# Patient Record
Sex: Male | Born: 1940 | Race: White | Hispanic: No | Marital: Married | Smoking: Former smoker
Health system: Southern US, Community
[De-identification: ages and names within clinical notes are randomized; demographics above are authoritative.]

## PROBLEM LIST (undated history)

## (undated) DIAGNOSIS — S99919A Unspecified injury of unspecified ankle, initial encounter: Secondary | ICD-10-CM

## (undated) DIAGNOSIS — C4491 Basal cell carcinoma of skin, unspecified: Secondary | ICD-10-CM

## (undated) DIAGNOSIS — I10 Essential (primary) hypertension: Secondary | ICD-10-CM

## (undated) DIAGNOSIS — C61 Malignant neoplasm of prostate: Secondary | ICD-10-CM

## (undated) DIAGNOSIS — E785 Hyperlipidemia, unspecified: Secondary | ICD-10-CM

## (undated) DIAGNOSIS — T7840XA Allergy, unspecified, initial encounter: Secondary | ICD-10-CM

## (undated) DIAGNOSIS — K259 Gastric ulcer, unspecified as acute or chronic, without hemorrhage or perforation: Secondary | ICD-10-CM

## (undated) DIAGNOSIS — H269 Unspecified cataract: Secondary | ICD-10-CM

## (undated) DIAGNOSIS — K219 Gastro-esophageal reflux disease without esophagitis: Secondary | ICD-10-CM

## (undated) DIAGNOSIS — J189 Pneumonia, unspecified organism: Secondary | ICD-10-CM

## (undated) DIAGNOSIS — J302 Other seasonal allergic rhinitis: Secondary | ICD-10-CM

## (undated) DIAGNOSIS — J329 Chronic sinusitis, unspecified: Secondary | ICD-10-CM

## (undated) DIAGNOSIS — Z5189 Encounter for other specified aftercare: Secondary | ICD-10-CM

## (undated) DIAGNOSIS — E039 Hypothyroidism, unspecified: Secondary | ICD-10-CM

## (undated) DIAGNOSIS — M1990 Unspecified osteoarthritis, unspecified site: Secondary | ICD-10-CM

## (undated) HISTORY — DX: Unspecified osteoarthritis, unspecified site: M19.90

## (undated) HISTORY — DX: Gastro-esophageal reflux disease without esophagitis: K21.9

## (undated) HISTORY — DX: Essential (primary) hypertension: I10

## (undated) HISTORY — DX: Malignant neoplasm of prostate: C61

## (undated) HISTORY — DX: Pneumonia, unspecified organism: J18.9

## (undated) HISTORY — PX: CATARACT EXTRACTION, BILATERAL: SHX1313

## (undated) HISTORY — PX: OTHER SURGICAL HISTORY: SHX169

## (undated) HISTORY — DX: Basal cell carcinoma of skin, unspecified: C44.91

## (undated) HISTORY — DX: Encounter for other specified aftercare: Z51.89

## (undated) HISTORY — PX: POLYPECTOMY: SHX149

## (undated) HISTORY — PX: UPPER GASTROINTESTINAL ENDOSCOPY: SHX188

## (undated) HISTORY — PX: PROSTATE BIOPSY: SHX241

## (undated) HISTORY — DX: Chronic sinusitis, unspecified: J32.9

## (undated) HISTORY — DX: Allergy, unspecified, initial encounter: T78.40XA

## (undated) HISTORY — PX: MOHS SURGERY: SUR867

## (undated) HISTORY — PX: BACK SURGERY: SHX140

## (undated) HISTORY — DX: Unspecified cataract: H26.9

## (undated) HISTORY — DX: Unspecified injury of unspecified ankle, initial encounter: S99.919A

## (undated) HISTORY — DX: Hyperlipidemia, unspecified: E78.5

## (undated) HISTORY — PX: COLONOSCOPY: SHX174

## (undated) HISTORY — DX: Hypothyroidism, unspecified: E03.9

## (undated) HISTORY — DX: Other seasonal allergic rhinitis: J30.2

## (undated) HISTORY — DX: Gastric ulcer, unspecified as acute or chronic, without hemorrhage or perforation: K25.9

---
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
## 1980-03-07 HISTORY — PX: RECONSTRUCTION TENDON PULLEY W/ TENDON / FASCIAL GRAFT OF HAND / FINGER: SUR1091

## 1998-11-11 HISTORY — PX: CYSTOURETHROSCOPY: SHX476

## 2001-06-01 DIAGNOSIS — S99919A Unspecified injury of unspecified ankle, initial encounter: Secondary | ICD-10-CM

## 2001-06-01 HISTORY — DX: Unspecified injury of unspecified ankle, initial encounter: S99.919A

## 2005-03-07 HISTORY — PX: LUMBAR LAMINECTOMY: SHX95

## 2006-04-12 DIAGNOSIS — J189 Pneumonia, unspecified organism: Secondary | ICD-10-CM

## 2006-04-12 HISTORY — DX: Pneumonia, unspecified organism: J18.9

## 2008-03-07 DIAGNOSIS — K259 Gastric ulcer, unspecified as acute or chronic, without hemorrhage or perforation: Secondary | ICD-10-CM

## 2008-03-07 HISTORY — PX: ESOPHAGOGASTRODUODENOSCOPY: SHX1529

## 2008-03-07 HISTORY — DX: Gastric ulcer, unspecified as acute or chronic, without hemorrhage or perforation: K25.9

## 2011-01-20 ENCOUNTER — Encounter: Payer: Self-pay | Admitting: Internal Medicine

## 2011-02-04 ENCOUNTER — Encounter: Payer: Self-pay | Admitting: Internal Medicine

## 2011-02-04 ENCOUNTER — Ambulatory Visit (AMBULATORY_SURGERY_CENTER): Payer: Medicare Other | Admitting: *Deleted

## 2011-02-04 ENCOUNTER — Telehealth: Payer: Self-pay | Admitting: *Deleted

## 2011-02-04 VITALS — Ht 68.0 in | Wt 220.7 lb

## 2011-02-04 DIAGNOSIS — Z1211 Encounter for screening for malignant neoplasm of colon: Secondary | ICD-10-CM

## 2011-02-04 MED ORDER — PEG-KCL-NACL-NASULF-NA ASC-C 100 G PO SOLR: ORAL | Status: DC

## 2011-02-04 NOTE — Telephone Encounter (Signed)
Faxed Medical Release Information form to Dr Merilynn Finland ofc at fax [phone], ph [phone].

## 2011-02-04 NOTE — Telephone Encounter (Signed)
Pt had prior colonoscopy with Dr. Hildred Alamin in [place], [place] in 2007.  Pt thinks he had polyps.  Release of information form signed and given to Graciella Freer, RN.  Pt scheduled for colonoscopy with Dr. Rhea Belton 02/18/2011.  Eddie Ferguson

## 2011-02-04 NOTE — Progress Notes (Signed)
Pt had prior colonoscopy with Dr. Venkatesh Lakshman in [place], [place] in 2007.  Pt thinks he had polyps.  Release of information form signed and given to Cynthia Bradley, RN.  Pt scheduled for colonoscopy with Dr. Pyrtle 02/18/2011.  [name]  

## 2011-02-18 ENCOUNTER — Ambulatory Visit (AMBULATORY_SURGERY_CENTER): Payer: Medicare Other | Admitting: Internal Medicine

## 2011-02-18 ENCOUNTER — Encounter: Payer: Self-pay | Admitting: Internal Medicine

## 2011-02-18 VITALS — BP 157/91 | HR 62 | Temp 97.6°F | Resp 18 | Ht 68.0 in | Wt 220.0 lb

## 2011-02-18 DIAGNOSIS — D126 Benign neoplasm of colon, unspecified: Secondary | ICD-10-CM

## 2011-02-18 DIAGNOSIS — Z86010 Personal history of colonic polyps: Secondary | ICD-10-CM

## 2011-02-18 DIAGNOSIS — K635 Polyp of colon: Secondary | ICD-10-CM

## 2011-02-18 DIAGNOSIS — Z1211 Encounter for screening for malignant neoplasm of colon: Secondary | ICD-10-CM

## 2011-02-18 MED ORDER — SODIUM CHLORIDE 0.9 % IV SOLN: 500.0000 mL | INTRAVENOUS | Status: DC

## 2011-02-18 NOTE — Patient Instructions (Signed)
POLYPS REMOVED AND SENT TO PATHOLOGY  MODERATE DIVERTICULOSIS  SEE GREEN AND BLUE SHEETS FOR ADDITIONAL D/C INSTRUCTIONS

## 2011-02-18 NOTE — Progress Notes (Signed)
Patient did not experience any of the following events: a burn prior to discharge; a fall within the facility; wrong site/side/patient/procedure/implant event; or a hospital transfer or hospital admission upon discharge from the facility. (G8907) Patient did not have preoperative order for IV antibiotic SSI prophylaxis. (G8918)  

## 2011-02-18 NOTE — Op Note (Signed)
[facility] [address] [place], [place]  [address]  COLONOSCOPY PROCEDURE REPORT  PATIENT:  Kacey, Vicuna  MR#:  [id number] BIRTHDATE:  03-Jun-1940, 70 yrs. old  GENDER:  male ENDOSCOPIST:  Carie Caddy. Correen Bubolz, MD REF. BY:  Gabriel Earing, M.D. PROCEDURE DATE:  02/18/2011 PROCEDURE:  Colonoscopy with snare polypectomy, Colon with cold biopsy polypectomy ASA CLASS:  Class II INDICATIONS:  surveillance and high-risk screening  (history of colon polyps) MEDICATIONS:   These medications were titrated to patient response per physician's verbal order, Versed 6 mg IV, Fentanyl 50 mcg IV  DESCRIPTION OF PROCEDURE:   After the risks benefits and alternatives of the procedure were thoroughly explained, informed consent was obtained.  Digital rectal exam was performed and revealed no rectal masses.   The LB CF-[id number] endoscope was introduced through the anus and advanced to the cecum, which was identified by both the appendix and ileocecal valve, without limitations.  The quality of the prep was good, using MoviPrep. The instrument was then slowly withdrawn as the colon was fully examined. <<PROCEDUREIMAGES>> FINDINGS:  A 5 mm sessile polyp was found in the mid transverse colon. Polyp was snared without cautery. Retrieval was successful. Two 3 mmm sessile polyps were found in the rectum. The polyps were removed using cold biopsy forceps.  Moderate diverticulosis was found transverse to sigmoid colon.  Retroflexed views in the rectum revealed no abnormalities.   The scope was then withdrawn from the cecum and the procedure completed.  COMPLICATIONS:  None  ENDOSCOPIC IMPRESSION: 1) Sessile polyp in the mid transverse colon.  Removed and sent to pathology. 2) Two polyps in the rectum. Removed and sent to pathology. 3) Moderate diverticulosis in the transverse to sigmoid colon  RECOMMENDATIONS: 1) Await pathology results 2) High fiber diet. 3) If the polyp(s) removed  today are proven to be adenomatous (pre-cancerous) polyps, you will need a colonoscopy in 3 years. Otherwise you should continue to follow colorectal cancer screening guidelines for patients with a history of colon  polyps with a colonoscopy in 5 years. 4) You will receive a letter within 1-2 weeks with the results of your biopsy as well as final recommendations. Please call my office if you have not received a letter after 3 weeks.  Carie Caddy. Rhea Belton, MD  CC:  Gabriel Earing, MD The Patient  n. eSIGNEDCarie Caddy. Breslin Burklow at 02/18/2011 09:25 AM  Kandis Cocking, [phone]

## 2011-02-21 ENCOUNTER — Telehealth: Payer: Self-pay

## 2011-02-21 NOTE — Telephone Encounter (Signed)
Line busy. Unable to leave message. Attempted times two.

## 2011-02-23 ENCOUNTER — Encounter: Payer: Self-pay | Admitting: Internal Medicine

## 2011-04-24 DIAGNOSIS — Z8619 Personal history of other infectious and parasitic diseases: Secondary | ICD-10-CM | POA: Insufficient documentation

## 2011-09-22 ENCOUNTER — Telehealth: Payer: Self-pay | Admitting: Oncology

## 2011-09-22 NOTE — Telephone Encounter (Signed)
lmonvm for pt re calling me for appt w/FS. °

## 2011-09-23 ENCOUNTER — Telehealth: Payer: Self-pay | Admitting: Oncology

## 2011-09-23 NOTE — Telephone Encounter (Signed)
lmonvm for pt re calling me for appt w/FS. 2nd attempt.  °

## 2011-09-26 ENCOUNTER — Telehealth: Payer: Self-pay | Admitting: Oncology

## 2011-09-26 NOTE — Telephone Encounter (Signed)
Referred by Dr. Gabriel Earing Dx- Prostate Ca. NP package mailed out.

## 2011-09-26 NOTE — Telephone Encounter (Signed)
S/w pt re appt for 8/2 @ 10:30 am. D/t per pt.

## 2011-10-05 ENCOUNTER — Other Ambulatory Visit: Payer: Self-pay | Admitting: Oncology

## 2011-10-05 DIAGNOSIS — C61 Malignant neoplasm of prostate: Secondary | ICD-10-CM

## 2011-10-07 ENCOUNTER — Ambulatory Visit (HOSPITAL_BASED_OUTPATIENT_CLINIC_OR_DEPARTMENT_OTHER): Payer: Medicare Other | Admitting: Oncology

## 2011-10-07 ENCOUNTER — Telehealth: Payer: Self-pay | Admitting: Oncology

## 2011-10-07 ENCOUNTER — Other Ambulatory Visit (HOSPITAL_BASED_OUTPATIENT_CLINIC_OR_DEPARTMENT_OTHER): Payer: Medicare Other | Admitting: Lab

## 2011-10-07 ENCOUNTER — Ambulatory Visit: Payer: Medicare Other

## 2011-10-07 VITALS — BP 148/70 | HR 68 | Temp 97.6°F | Resp 20 | Ht 68.0 in | Wt 229.5 lb

## 2011-10-07 DIAGNOSIS — E119 Type 2 diabetes mellitus without complications: Secondary | ICD-10-CM

## 2011-10-07 DIAGNOSIS — C61 Malignant neoplasm of prostate: Secondary | ICD-10-CM

## 2011-10-07 DIAGNOSIS — I10 Essential (primary) hypertension: Secondary | ICD-10-CM

## 2011-10-07 DIAGNOSIS — Z808 Family history of malignant neoplasm of other organs or systems: Secondary | ICD-10-CM

## 2011-10-07 LAB — CBC WITH DIFFERENTIAL/PLATELET
Basophils Absolute: 0 10*3/uL (ref 0.0–0.1)
EOS%: 3.5 % (ref 0.0–7.0)
HGB: 14.3 g/dL (ref 13.0–17.1)
MCH: 30.7 pg (ref 27.2–33.4)
NEUT#: 4.5 10*3/uL (ref 1.5–6.5)
RDW: 13.8 % (ref 11.0–14.6)
lymph#: 1.1 10*3/uL (ref 0.9–3.3)

## 2011-10-07 LAB — COMPREHENSIVE METABOLIC PANEL
ALT: 31 U/L (ref 0–53)
AST: 25 U/L (ref 0–37)
Albumin: 4.1 g/dL (ref 3.5–5.2)
BUN: 13 mg/dL (ref 6–23)
Calcium: 8.8 mg/dL (ref 8.4–10.5)
Chloride: 104 mEq/L (ref 96–112)
Potassium: 3.8 mEq/L (ref 3.5–5.3)

## 2011-10-07 LAB — TESTOSTERONE: Testosterone: 201.39 ng/dL — ABNORMAL LOW (ref 300–890)

## 2011-10-07 NOTE — Telephone Encounter (Signed)
Pt req October for appt,aware that he will rec. A call. Ref. Form filled out and sent to HIM

## 2011-10-07 NOTE — Progress Notes (Signed)
Note dictated

## 2011-10-10 ENCOUNTER — Telehealth: Payer: Self-pay | Admitting: *Deleted

## 2011-10-10 NOTE — Telephone Encounter (Signed)
Called patient and left v/m with PSA results drawn 10/07/2011.  Encouraged to call back with any further questions.

## 2011-10-10 NOTE — Progress Notes (Signed)
CC:   Gabriel Earing, M.D. [facility]  REASON FOR CONSULTATION:  Prostate cancer.  HISTORY OF PRESENT ILLNESS:  This is a 71 year old gentleman, a native of [place], lived in multiple locations, currently of [place].  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 He also was in the [employer] at one point.  This is a gentleman with past medical history for hypertension, diabetes and followed by Dr. Gabriel Earing for his routine medical care.  On a routine PSA back in November of 2012 his PSA was found to be elevated at 5.9.  The patient was evaluated by Dr. Leanord Asal from Urology at [facility] and underwent a prostate biopsy on 07/29/2011.  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  The patient was evaluated (as mentioned) by Dr. Andrey Campanile postoperatively, did well with the biopsy and the treatment options were discussed and he was given different treatment options that included surgery, radiation therapy, observation and possible local therapy.  The patient would like to establish care here in [place] and was referred to me for evaluation regarding his new diagnosis of prostate cancer.  Clinically he feels well.  He does not report any back pain, shoulder pain, hip pain.  Does not report any urination issues.  Has not reported any frequency or urgency.  Does not report any hesitancy.  Did not report any musculoskeletal complaints.  Did not report any other complaints.  His performance status is excellent.  REVIEW OF SYSTEMS:  Did not report any headaches, blurry vision, double vision.  Did not report any motor or sensory neuropathy.  Did not report any alteration of mental status.  Did not report any psychiatric issues, depression.  Did not report any fever, chills,  sweats.  There has not been any cough, hemoptysis, hematemesis.  No nausea, vomiting.  No abdominal pain, hematochezia, melena.  No genitourinary complaints. Rest of review of systems unremarkable.  PAST MEDICAL HISTORY:  Significant for hypertension, diabetes, hyperlipidemia, history of glaucoma, hypothyroidism.  MEDICATIONS:  He is on Tylenol, carvedilol, vitamin D, diltiazem, Allegra, Flonase, Xalatan, Synthroid, glucosamine, multivitamin, niacin, omega 3, omeprazole, Accupril.  ALLERGIES:  Sulfa.  SOCIAL HISTORY:  He is married.  He has 2 sons.  Denied any alcohol or tobacco abuse.  FAMILY HISTORY:  His brother had lung cancer with brain metastasis. Father died of an MI.  Really no history of any prostate cancer that he could admit to.  PHYSICAL EXAMINATION:  Alert, awake gentleman, appeared in no active distress.  His blood pressure is 148/70, pulse 68, respirations 20.  He is afebrile.  Head is normocephalic, atraumatic.  Pupils equal, round, reactive to light.  Oral mucosa moist and pink.  Neck supple without adenopathy.  Heart is regular rate and rhythm.  S1, S2.  Lungs clear to auscultation.  No rhonchi, wheeze or dullness to percussion.  Abdomen soft, nontender.  No hepatosplenomegaly.  Extremities:  No edema.  LABORATORY DATA:  His hemoglobin is 14, white cell count 6.8, platelet count of 202.  The patient reportedly had a PSA 08/29/2011 which showed actually that his PSA went down from 5.9 to around 4.8.  ASSESSMENT AND PLAN:  This is a very pleasant gentleman with the following issues: A diagnosis of  prostate cancer.  He had a Gleason score 3 plus 3 equals 8 (sic) with very tertiary pattern of Gleason score of 7.  He has a clinical stage of T2c and a PSA at the time of diagnosis 5.9 that recently dropped down to less than 5 around 4.8 or so.  I had a lengthy discussion today again with Mr. Quintanar discussing the natural course of prostate cancer and different  treatment options.  I reiterated a lot of the options that Dr. Andrey Campanile had outlined extensively.  I feel that Mr. Vuolo is a good candidate for observation and surveillance.  I reiterated to him the findings of the Pivot trial and that surveillance is really not inferior to a radical prostatectomy.  He is interested in pursuing that.  He would like to have that done locally.  For that reason I will make referrals to [facility] at this time.  I told Mr. Crisman I would be more than happy to see him in the future regarding counseling or regarding his prostate cancer; at the time being there is really no role for medical oncology, no systemic treatment is needed at this time.    ______________________________ Benjiman Core, M.D. FNS/MEDQ  D:  10/07/2011  T:  10/07/2011  Job:  [id number]

## 2011-10-20 ENCOUNTER — Telehealth: Payer: Self-pay | Admitting: Oncology

## 2011-10-20 NOTE — Telephone Encounter (Signed)
Per [facility] the pt is seeing Dr Laverle Patter 12/23/11 11;15 per pt request   aom

## 2012-01-03 ENCOUNTER — Other Ambulatory Visit (HOSPITAL_COMMUNITY): Payer: Self-pay | Admitting: Urology

## 2012-01-03 DIAGNOSIS — C61 Malignant neoplasm of prostate: Secondary | ICD-10-CM

## 2012-01-13 ENCOUNTER — Ambulatory Visit (HOSPITAL_COMMUNITY)
Admission: RE | Admit: 2012-01-13 | Discharge: 2012-01-13 | Disposition: A | Discharge status: Home / Self Care | Payer: Medicare Other | Source: Ambulatory Visit | Attending: Urology | Admitting: Urology

## 2012-01-13 ENCOUNTER — Other Ambulatory Visit (HOSPITAL_COMMUNITY): Payer: Self-pay | Admitting: Urology

## 2012-01-13 DIAGNOSIS — C61 Malignant neoplasm of prostate: Secondary | ICD-10-CM

## 2012-01-13 MED ORDER — GADOBENATE DIMEGLUMINE 529 MG/ML IV SOLN
20.0000 mL | Freq: Once | INTRAVENOUS | Status: AC | PRN
  Administered 2012-01-13: 20 mL via INTRAVENOUS

## 2012-11-18 DIAGNOSIS — Z85828 Personal history of other malignant neoplasm of skin: Secondary | ICD-10-CM | POA: Insufficient documentation

## 2013-09-09 LAB — HM DIABETES EYE EXAM

## 2014-02-13 ENCOUNTER — Ambulatory Visit (HOSPITAL_COMMUNITY): Payer: Medicare Other | Attending: Emergency Medicine

## 2014-02-13 ENCOUNTER — Encounter (HOSPITAL_COMMUNITY): Payer: Self-pay | Admitting: Emergency Medicine

## 2014-02-13 ENCOUNTER — Emergency Department (HOSPITAL_COMMUNITY)
Admission: EM | Admit: 2014-02-13 | Discharge: 2014-02-13 | Disposition: A | Discharge status: Home / Self Care | Payer: Medicare Other | Source: Home / Self Care | Attending: Family Medicine | Admitting: Family Medicine

## 2014-02-13 DIAGNOSIS — J208 Acute bronchitis due to other specified organisms: Secondary | ICD-10-CM

## 2014-02-13 DIAGNOSIS — R0902 Hypoxemia: Secondary | ICD-10-CM | POA: Diagnosis not present

## 2014-02-13 DIAGNOSIS — R05 Cough: Secondary | ICD-10-CM | POA: Insufficient documentation

## 2014-02-13 DIAGNOSIS — R509 Fever, unspecified: Secondary | ICD-10-CM | POA: Insufficient documentation

## 2014-02-13 DIAGNOSIS — R059 Cough, unspecified: Secondary | ICD-10-CM

## 2014-02-13 MED ORDER — ALBUTEROL SULFATE (5 MG/ML) 0.5% IN NEBU
5.0000 mg | INHALATION_SOLUTION | Freq: Once | RESPIRATORY_TRACT | Status: AC
  Administered 2014-02-13: 5 mg via RESPIRATORY_TRACT

## 2014-02-13 MED ORDER — PREDNISONE 20 MG PO TABS: ORAL_TABLET | ORAL | Status: DC

## 2014-02-13 MED ORDER — ALBUTEROL SULFATE HFA 108 (90 BASE) MCG/ACT IN AERS: 2.0000 | INHALATION_SPRAY | RESPIRATORY_TRACT | Status: DC | PRN

## 2014-02-13 MED ORDER — ALBUTEROL SULFATE (2.5 MG/3ML) 0.083% IN NEBU
INHALATION_SOLUTION | RESPIRATORY_TRACT | Status: AC
  Filled 2014-02-13: qty 6

## 2014-02-13 NOTE — ED Notes (Signed)
Transferred to [facility] for chest xray

## 2014-02-13 NOTE — ED Provider Notes (Signed)
CSN: [id number]     Arrival date & time 02/13/14  0935 History   First MD Initiated Contact with Patient 02/13/14 0945     Chief Complaint  Patient presents with  . Cough   (Consider location/radiation/quality/duration/timing/severity/associated sxs/prior Treatment) HPI Comments: 73 year old man complaining of a cough for 6 weeks. He describes it as a frequent and spasmodic type cough. He denies shortness of breath except with having coughing spasms. He has been out of town and has seen 3 separate physicians for sinus infection, inner ear infection and congestion in the throat. He is currently taking Ceftinir. He developed a low-grade fever this morning. His cough persist although he has a rare cough during the exam. His only medications for symptoms are cough drops and Robitussin. He went to work yesterday and was exhausted afterwards. He has a history of well-controlled type 2 diabetes mellitus, hypertension, hypothyroidism and dyslipidemia.    Past Medical History  Diagnosis Date  . Diabetes mellitus   . Hypertension   . Hyperlipidemia   . Hypothyroidism   . Seasonal allergies   . Glaucoma   . Arthritis   . Blood transfusion     2010 because of stomach ulcers  . Stomach ulcer 2010    bleeding ulcer result NSAIDS   Past Surgical History  Procedure Laterality Date  . Lumbar laminectomy  2007  . Reconstruction tendon pulley w/ tendon / fascial graft of hand / finger  1982    rt 3rd finger  . Reconstruction tendon pulley w/ tendon / fascial graft of hand / finger  1953    rt 5th finger   Family History  Problem Relation Age of Onset  . Colon cancer Neg Hx   . Esophageal cancer Neg Hx   . Stomach cancer Neg Hx    History  Substance Use Topics  . Smoking status: Former Smoker    Quit date: 02/03/2001  . Smokeless tobacco: Never Used  . Alcohol Use: 3.6 oz/week    6 Cans of beer per week    Review of Systems  Constitutional: Positive for fever, activity change and  appetite change.  HENT: Positive for congestion and postnasal drip. Negative for ear pain and sore throat.   Eyes: Negative.   Respiratory: Positive for cough. Negative for chest tightness and wheezing.   Cardiovascular: Negative for chest pain.  Gastrointestinal: Negative.   Genitourinary: Negative.   Musculoskeletal: Negative.   Skin: Negative for color change and rash.  Neurological: Negative.     Allergies  Amoxicillin-pot clavulanate and Sulfa antibiotics  Home Medications   Prior to Admission medications   Medication Sig Start Date End Date Taking? Authorizing Provider  acetaminophen (TYLENOL) 500 MG tablet Take 500 mg by mouth every 6 (six) hours as needed.      Historical Provider, MD  albuterol (PROVENTIL HFA;VENTOLIN HFA) 108 (90 BASE) MCG/ACT inhaler Inhale 2 puffs into the lungs every 4 (four) hours as needed for wheezing or shortness of breath. 02/13/14   Janne Napoleon, NP  carvedilol (COREG) 25 MG tablet Take 25 mg by mouth 2 (two) times daily with a meal.  12/17/10   Historical Provider, MD  Cholecalciferol (VITAMIN D) 2000 UNITS CAPS Take by mouth daily.      Historical Provider, MD  diltiazem (CARDIZEM CD) 180 MG 24 hr capsule Take 180 mg by mouth 2 (two) times daily.  12/17/10   Historical Provider, MD  fluticasone (FLONASE) 50 MCG/ACT nasal spray Place 2 sprays into the nose 2 (two)  times daily.      Historical Provider, MD  Glucosamine-Chondroit-Vit C-Mn (GLUCOSAMINE 1500 COMPLEX PO) Take by mouth daily.      Historical Provider, MD  latanoprost (XALATAN) 0.005 % ophthalmic solution Place 1 drop into both eyes at bedtime.  01/21/11   Historical Provider, MD  Multiple Vitamin (MULTIVITAMIN) tablet Take 1 tablet by mouth daily.      Historical Provider, MD  niacin 500 MG tablet Take 500 mg by mouth daily with breakfast.      Historical Provider, MD  Omega-3 Fatty Acids (FISH OIL) 1200 MG CAPS Take by mouth daily.      Historical Provider, MD  omeprazole (PRILOSEC) 20 MG  capsule Take 20 mg by mouth daily.      Historical Provider, MD  pioglitazone (ACTOS) 30 MG tablet Take 30 mg by mouth daily.      Historical Provider, MD  pravastatin (PRAVACHOL) 40 MG tablet Take 40 mg by mouth daily.  01/19/11   Historical Provider, MD  predniSONE (DELTASONE) 20 MG tablet Take 3 tabs po on first day, 2 tabs second day, 2 tabs third day, 1 tab fourth day, 1 tab 5th day. Take with food. 02/13/14   Janne Napoleon, NP  quinapril (ACCUPRIL) 20 MG tablet Take 20 mg by mouth daily.  12/17/10   Historical Provider, MD  SYNTHROID 50 MCG tablet Take 50 mcg by mouth daily.  01/17/11   Historical Provider, MD   BP 152/69 mmHg  Pulse 78  Temp(Src) 99.9 F (37.7 C) (Oral)  Resp 18  SpO2 98% Physical Exam  Constitutional: He is oriented to person, place, and time. He appears well-developed and well-nourished. No distress.  HENT:  Mouth/Throat: Oropharynx is clear and moist. No oropharyngeal exudate.  Bilateral TMs are normal   Eyes: Conjunctivae and EOM are normal.  Neck: Normal range of motion. Neck supple.  Cardiovascular: Normal rate, regular rhythm and normal heart sounds.   Pulmonary/Chest: Effort normal. No respiratory distress.  Rare wheeze in the right lung fields, few crackles on the right. Left lung clear.  Musculoskeletal: He exhibits no edema.  Lymphadenopathy:    He has no cervical adenopathy.  Neurological: He is alert and oriented to person, place, and time.  Skin: Skin is warm and dry.  Psychiatric: He has a normal mood and affect.  Nursing note and vitals reviewed.   ED Course  Procedures (including critical care time) Labs Review Labs Reviewed - No data to display  Imaging Review Dg Chest 2 View  02/13/2014   CLINICAL DATA:  Productive cough and fever.  Hypoxia.  EXAM: CHEST  2 VIEW  COMPARISON:  none  FINDINGS: Heart size and vascularity are normal. Mild atelectasis in the lung bases without pneumonia or effusion. Negative for mass lesion. Mild bibasilar  atelectasis. No definite pneumonia.  IMPRESSION: Mild bibasilar atelectasis.   Electronically Signed   By: Franchot Gallo M.D.   On: 02/13/2014 12:00     MDM   1. Viral bronchitis   2. Cough   3. Fever   4. Hypoxia    Improved air movement and decrease cough and wheeze in Right lung post Albuterol 5mg  neb Albuterol HFA as dir Prednisone with food as dir Prilosec OTC Finish ABX If worse go to the ED      Janne Napoleon, NP 02/13/14 1241

## 2014-02-13 NOTE — ED Notes (Signed)
Waiting for radiology report.

## 2014-02-13 NOTE — Discharge Instructions (Signed)
Acute Bronchitis °Bronchitis is inflammation of the airways that extend from the windpipe into the lungs (bronchi). The inflammation often causes mucus to develop. This leads to a cough, which is the most common symptom of bronchitis.  °In acute bronchitis, the condition usually develops suddenly and goes away over time, usually in a couple weeks. Smoking, allergies, and asthma can make bronchitis worse. Repeated episodes of bronchitis may cause further lung problems.  °CAUSES °Acute bronchitis is most often caused by the same virus that causes a cold. The virus can spread from person to person (contagious) through coughing, sneezing, and touching contaminated objects. °SIGNS AND SYMPTOMS  °· Cough.   °· Fever.   °· Coughing up mucus.   °· Body aches.   °· Chest congestion.   °· Chills.   °· Shortness of breath.   °· Sore throat.   °DIAGNOSIS  °Acute bronchitis is usually diagnosed through a physical exam. Your health care provider will also ask you questions about your medical history. Tests, such as chest X-rays, are sometimes done to rule out other conditions.  °TREATMENT  °Acute bronchitis usually goes away in a couple weeks. Oftentimes, no medical treatment is necessary. Medicines are sometimes given for relief of fever or cough. Antibiotic medicines are usually not needed but may be prescribed in certain situations. In some cases, an inhaler may be recommended to help reduce shortness of breath and control the cough. A cool mist vaporizer may also be used to help thin bronchial secretions and make it easier to clear the chest.  °HOME CARE INSTRUCTIONS °· Get plenty of rest.   °· Drink enough fluids to keep your urine clear or pale yellow (unless you have a medical condition that requires fluid restriction). Increasing fluids may help thin your respiratory secretions (sputum) and reduce chest congestion, and it will prevent dehydration.   °· Take medicines only as directed by your health care provider. °· If  you were prescribed an antibiotic medicine, finish it all even if you start to feel better. °· Avoid smoking and secondhand smoke. Exposure to cigarette smoke or irritating chemicals will make bronchitis worse. If you are a smoker, consider using nicotine gum or skin patches to help control withdrawal symptoms. Quitting smoking will help your lungs heal faster.   °· Reduce the chances of another bout of acute bronchitis by washing your hands frequently, avoiding people with cold symptoms, and trying not to touch your hands to your mouth, nose, or eyes.   °· Keep all follow-up visits as directed by your health care provider.   °SEEK MEDICAL CARE IF: °Your symptoms do not improve after 1 week of treatment.  °SEEK IMMEDIATE MEDICAL CARE IF: °· You develop an increased fever or chills.   °· You have chest pain.   °· You have severe shortness of breath. °· You have bloody sputum.   °· You develop dehydration. °· You faint or repeatedly feel like you are going to pass out. °· You develop repeated vomiting. °· You develop a severe headache. °MAKE SURE YOU:  °· Understand these instructions. °· Will watch your condition. °· Will get help right away if you are not doing well or get worse. °Document Released: 03/31/2004 Document Revised: 07/08/2013 Document Reviewed: 08/14/2012 °ExitCare® Patient Information ©2015 [employer]. This information is not intended to replace advice given to you by your health care provider. Make sure you discuss any questions you have with your health care provider. ° °Cough, Adult ° A cough is a reflex. It helps you clear your throat and airways. A cough can help heal your body.   A cough can last 2 or 3 weeks (acute) or may last more than 8 weeks (chronic). Some common causes of a cough can include an infection, allergy, or a cold. °HOME CARE °· Only take medicine as told by your doctor. °· If given, take your medicines (antibiotics) as told. Finish them even if you start to feel better. °· Use  a cold steam vaporizer or humidifier in your home. This can help loosen thick spit (secretions). °· Sleep so you are almost sitting up (semi-upright). Use pillows to do this. This helps reduce coughing. °· Rest as needed. °· Stop smoking if you smoke. °GET HELP RIGHT AWAY IF: °· You have yellowish-white fluid (pus) in your thick spit. °· Your cough gets worse. °· Your medicine does not reduce coughing, and you are losing sleep. °· You cough up blood. °· You have trouble breathing. °· Your pain gets worse and medicine does not help. °· You have a fever. °MAKE SURE YOU:  °· Understand these instructions. °· Will watch your condition. °· Will get help right away if you are not doing well or get worse. °Document Released: 11/04/2010 Document Revised: 07/08/2013 Document Reviewed: 11/04/2010 °ExitCare® Patient Information ©2015 [employer]. This information is not intended to replace advice given to you by your health care provider. Make sure you discuss any questions you have with your health care provider. ° °

## 2014-02-13 NOTE — ED Notes (Signed)
Cough, tired.

## 2014-04-23 ENCOUNTER — Ambulatory Visit (INDEPENDENT_AMBULATORY_CARE_PROVIDER_SITE_OTHER): Payer: Medicare Other | Admitting: Family Medicine

## 2014-04-23 ENCOUNTER — Encounter: Payer: Self-pay | Admitting: Family Medicine

## 2014-04-23 VITALS — BP 138/80 | Temp 98.3°F | Wt 233.0 lb

## 2014-04-23 DIAGNOSIS — E785 Hyperlipidemia, unspecified: Secondary | ICD-10-CM | POA: Insufficient documentation

## 2014-04-23 DIAGNOSIS — E034 Atrophy of thyroid (acquired): Secondary | ICD-10-CM

## 2014-04-23 DIAGNOSIS — K219 Gastro-esophageal reflux disease without esophagitis: Secondary | ICD-10-CM | POA: Insufficient documentation

## 2014-04-23 DIAGNOSIS — I10 Essential (primary) hypertension: Secondary | ICD-10-CM

## 2014-04-23 DIAGNOSIS — K259 Gastric ulcer, unspecified as acute or chronic, without hemorrhage or perforation: Secondary | ICD-10-CM | POA: Insufficient documentation

## 2014-04-23 DIAGNOSIS — J309 Allergic rhinitis, unspecified: Secondary | ICD-10-CM | POA: Insufficient documentation

## 2014-04-23 DIAGNOSIS — E039 Hypothyroidism, unspecified: Secondary | ICD-10-CM | POA: Insufficient documentation

## 2014-04-23 DIAGNOSIS — E119 Type 2 diabetes mellitus without complications: Secondary | ICD-10-CM

## 2014-04-23 DIAGNOSIS — E1122 Type 2 diabetes mellitus with diabetic chronic kidney disease: Secondary | ICD-10-CM | POA: Insufficient documentation

## 2014-04-23 DIAGNOSIS — Z23 Encounter for immunization: Secondary | ICD-10-CM

## 2014-04-23 DIAGNOSIS — E038 Other specified hypothyroidism: Secondary | ICD-10-CM

## 2014-04-23 DIAGNOSIS — H409 Unspecified glaucoma: Secondary | ICD-10-CM | POA: Insufficient documentation

## 2014-04-23 LAB — HEMOGLOBIN A1C: Hgb A1c MFr Bld: 6.8 % — ABNORMAL HIGH (ref 4.6–6.5)

## 2014-04-23 MED ORDER — CARVEDILOL 25 MG PO TABS: 25.0000 mg | ORAL_TABLET | Freq: Two times a day (BID) | ORAL | Status: DC

## 2014-04-23 MED ORDER — DILTIAZEM HCL ER COATED BEADS 180 MG PO CP24: 180.0000 mg | ORAL_CAPSULE | Freq: Two times a day (BID) | ORAL | Status: DC

## 2014-04-23 MED ORDER — QUINAPRIL HCL 20 MG PO TABS: 20.0000 mg | ORAL_TABLET | Freq: Every day | ORAL | Status: DC

## 2014-04-23 MED ORDER — FLUTICASONE PROPIONATE 50 MCG/ACT NA SUSP: 2.0000 | Freq: Two times a day (BID) | NASAL | Status: DC

## 2014-04-23 MED ORDER — PIOGLITAZONE HCL 30 MG PO TABS: 30.0000 mg | ORAL_TABLET | Freq: Every day | ORAL | Status: DC

## 2014-04-23 MED ORDER — PRAVASTATIN SODIUM 40 MG PO TABS: 40.0000 mg | ORAL_TABLET | Freq: Every day | ORAL | Status: DC

## 2014-04-23 MED ORDER — LEVOTHYROXINE SODIUM 50 MCG PO TABS: 50.0000 ug | ORAL_TABLET | Freq: Every day | ORAL | Status: DC

## 2014-04-23 NOTE — Progress Notes (Signed)
Eddie Reddish, MD Phone: [phone]  Subjective:  Patient presents today to establish care with me as PCP. Dr. Thea Silversmith at [facility] has cared for Eddie Ferguson since 2008. Chief complaint-noted.   DIABETES Type II-controlled Reports last a1c 10/2013 of 6.4.   Did not tolerate metformin due to fatigue and januvia too expensive Medications taking and tolerating-yes Blood Sugars per patient-fasting-does not routinely check On Aspirin-contraindicated due to history GI bleed On statin-pravastatin Daily foot monitoring-yes  ROS- Denies Polyuria,Polydipsia, nocturia, Vision changes, feet or hand numbness/pain/tingling. Denies  Hypoglycemia symptoms (shaky, sweaty, hungry, weak anxious, tremor, palpitations, confusion, behavior change).   Hypertension-controlled on carvedilol 25mg  BID, diltiazem 180mg  XL, quinapril 20mg   BP Readings from Last 3 Encounters:  04/23/14 138/80  02/13/14 152/69  10/07/11 148/70  Home BP monitoring-no Compliant with medications-yes without side effects ROS-Denies any CP, HA, SOB, blurry vision.   Hyperlipidemia-on statin, unclear control  ROS- no chest pain or shortness of breath. No myalgias  Hypothyroidism-controlled previously per report On thyroid medication-synthroid 43mcg ROS-No hair or nail changes. No heat/cold intolerance. No constipation or diarrhea. Denies shakiness or anxiety.   The following were reviewed and entered/updated in epic: Past Medical History  Diagnosis Date  . Diabetes mellitus   . Hypertension   . Hyperlipidemia   . Hypothyroidism   . Seasonal allergies   . Glaucoma   . Arthritis   . Blood transfusion     2010 because of stomach ulcers  . Stomach ulcer 2010    bleeding ulcer result NSAIDS   Patient Active Problem List   Diagnosis Date Noted  . Diabetes mellitus type 2, controlled 04/23/2014    Priority: High  . Essential hypertension 04/23/2014    Priority: Medium  . Hyperlipidemia 04/23/2014    Priority: Medium    . Hypothyroidism 04/23/2014    Priority: Medium  . Stomach ulcer     Priority: Medium  . GERD (gastroesophageal reflux disease) 04/23/2014    Priority: Low  . Allergic rhinitis 04/23/2014    Priority: Low  . Glaucoma 04/23/2014    Priority: Low   Past Surgical History  Procedure Laterality Date  . Lumbar laminectomy  2007  . Reconstruction tendon pulley w/ tendon / fascial graft of hand / finger  1982    rt 3rd finger  . Reconstruction tendon pulley w/ tendon / fascial graft of hand / finger  1953    rt 5th finger    Family History  Problem Relation Age of Onset  . Colon cancer Neg Hx   . Esophageal cancer Neg Hx   . Stomach cancer Neg Hx   . Diabetes Mother   . Diabetes Father   . Hyperlipidemia Father   . Brain cancer Brother     smoker    Medications- reviewed and updated Current Outpatient Prescriptions  Medication Sig Dispense Refill  . carvedilol (COREG) 25 MG tablet Take 1 tablet (25 mg total) by mouth 2 (two) times daily with a meal. 180 tablet 3  . Cholecalciferol (VITAMIN D) 2000 UNITS CAPS Take by mouth daily.      Marland Kitchen diltiazem (CARDIZEM CD) 180 MG 24 hr capsule Take 1 capsule (180 mg total) by mouth 2 (two) times daily. 180 capsule 3  . Glucosamine-Chondroit-Vit C-Mn (GLUCOSAMINE 1500 COMPLEX PO) Take by mouth daily.      Marland Kitchen levothyroxine (SYNTHROID) 50 MCG tablet Take 1 tablet (50 mcg total) by mouth daily. 90 tablet 3  . Multiple Vitamin (MULTIVITAMIN) tablet Take 1 tablet by mouth daily.      Marland Kitchen  niacin 500 MG tablet Take 500 mg by mouth daily with breakfast.      . Omega-3 Fatty Acids (FISH OIL) 1200 MG CAPS Take by mouth daily.      Marland Kitchen omeprazole (PRILOSEC) 20 MG capsule Take 20 mg by mouth daily.      . pioglitazone (ACTOS) 30 MG tablet Take 1 tablet (30 mg total) by mouth daily. 90 tablet 3  . pravastatin (PRAVACHOL) 40 MG tablet Take 1 tablet (40 mg total) by mouth daily. 90 tablet 3  . quinapril (ACCUPRIL) 20 MG tablet Take 1 tablet (20 mg total) by  mouth daily. 90 tablet 3  . acetaminophen (TYLENOL) 500 MG tablet Take 500 mg by mouth every 6 (six) hours as needed.      . fluticasone (FLONASE) 50 MCG/ACT nasal spray Place 2 sprays into both nostrils 2 (two) times daily. 48 g 3  . latanoprost (XALATAN) 0.005 % ophthalmic solution Place 1 drop into both eyes at bedtime.      No current facility-administered medications for this visit.    Allergies-reviewed and updated Allergies  Allergen Reactions  . Amoxicillin-Pot Clavulanate Rash  . Sulfa Antibiotics Swelling and Rash    History   Social History  . Marital Status: Single    Spouse Name: N/A  . Number of Children: N/A  . Years of Education: N/A   Social History Main Topics  . Smoking status: Former Smoker    Quit date: 02/03/2001  . Smokeless tobacco: Never Used     Comment: occasoinal cigars previously  . Alcohol Use: 4.2 oz/week    7 Cans of beer per week  . Drug Use: No  . Sexual Activity: Not on file   Other Topics Concern  . None   Social History Narrative   Married (wife patient outside Network engineer). 2 children. 5 grandkids.       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       Hobbies: travel, enjoys going to shows, [facility] women's basketball tournament. Doesn't watch tv.     ROS--See HPI , otherwise full ROS was completed and negative except as noted above  Objective: BP 138/80 mmHg  Temp(Src) 98.3 F (36.8 C)  Wt 233 lb (105.688 kg) Gen: NAD, resting comfortably in chair HEENT: Mucous membranes are moist. Oropharynx normal. TM normal. Eyes: sclera and lids normal, PERRLA Neck: no thyromegaly, no cervical lymphadenopathy CV: RRR no murmurs rubs or gallops Lungs: CTAB no crackles, wheeze, rhonchi Abdomen: soft/nontender/nondistended/normal bowel sounds.  Ext: trace edema Skin: warm, dry Neuro: 5/5 strength in upper and lower extremities, normal gait, normal reflexes  Diabetic Foot Exam - Simple   Simple Foot Form  Diabetic Foot exam was performed  with the following findings:  Yes 04/23/2014  4:22 PM  Visual Inspection  No deformities, no ulcerations, no other skin breakdown bilaterally:  Yes  Sensation Testing  Intact to touch and monofilament testing bilaterally:  Yes  Pulse Check  Posterior Tibialis and Dorsalis pulse intact bilaterally:  Yes  Comments      Assessment/Plan:  Diabetes mellitus type 2, controlled Control per patient report in august with a1c 6.4 on actos 30mg  alone. Did not tolerate metformin and januvia $$. Not sure why no amaryl used. Has done well on actos though and will continue as long as a1c as good as reported.    Essential hypertension Reasonable control on carvedilol 25mg  BID, diltiazem 180mg  XL, quinapril 20mg . No changes.    Hyperlipidemia Check direct LDL with goal <100. Refilled  pravastatin 40mg .    Hypothyroidism Refilled synthroid 40mcg. Check TSH.    Return precautions advised. 4 month planned.   Orders Placed This Encounter  Procedures  . Td vaccine greater than 7yo preservative free IM  . Hemoglobin A1c    [place]  . CBC    [place]  . Comprehensive metabolic panel    [place]  . LDL cholesterol, direct    [place]  . TSH    [place]  Td today 2010 pneumovax-[facility] (verify through records) q54month eye exam.   Meds ordered this encounter  Medications  . diltiazem (CARDIZEM CD) 180 MG 24 hr capsule    Sig: Take 1 capsule (180 mg total) by mouth 2 (two) times daily.    Dispense:  180 capsule    Refill:  3    Do not fill any medications without patient request.  . fluticasone (FLONASE) 50 MCG/ACT nasal spray    Sig: Place 2 sprays into both nostrils 2 (two) times daily.    Dispense:  48 g    Refill:  3    Do not fill any medications without patient request.  . pioglitazone (ACTOS) 30 MG tablet    Sig: Take 1 tablet (30 mg total) by mouth daily.    Dispense:  90 tablet    Refill:  3    Do not fill any medications without patient request.  . pravastatin (PRAVACHOL)  40 MG tablet    Sig: Take 1 tablet (40 mg total) by mouth daily.    Dispense:  90 tablet    Refill:  3    Do not fill any medications without patient request.  . quinapril (ACCUPRIL) 20 MG tablet    Sig: Take 1 tablet (20 mg total) by mouth daily.    Dispense:  90 tablet    Refill:  3    Do not fill any medications without patient request.  . levothyroxine (SYNTHROID) 50 MCG tablet    Sig: Take 1 tablet (50 mcg total) by mouth daily.    Dispense:  90 tablet    Refill:  3    Do not fill any medications without patient request.  . carvedilol (COREG) 25 MG tablet    Sig: Take 1 tablet (25 mg total) by mouth 2 (two) times daily with a meal.    Dispense:  180 tablet    Refill:  3    Do not fill any medications without patient request.

## 2014-04-23 NOTE — Assessment & Plan Note (Signed)
Refilled synthroid 34mcg. Check TSH.

## 2014-04-23 NOTE — Assessment & Plan Note (Signed)
Control per patient report in august with a1c 6.4 on actos 30mg  alone. Did not tolerate metformin and januvia $$. Not sure why no amaryl used. Has done well on actos though and will continue as long as a1c as good as reported.

## 2014-04-23 NOTE — Patient Instructions (Addendum)
Sign release of information at the front desk for 1. Eye doctor for diabetic eye exam 2. 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

## 2014-04-23 NOTE — Assessment & Plan Note (Signed)
Reasonable control on carvedilol 25mg  BID, diltiazem 180mg  XL, quinapril 20mg . No changes.

## 2014-04-23 NOTE — Assessment & Plan Note (Signed)
Check direct LDL with goal <100. Refilled pravastatin 40mg .

## 2014-04-24 LAB — TSH: TSH: 2.91 u[IU]/mL (ref 0.35–4.50)

## 2014-04-24 LAB — CBC
HCT: 39.9 % (ref 39.0–52.0)
Hemoglobin: 13.4 g/dL (ref 13.0–17.0)
MCHC: 33.5 g/dL (ref 30.0–36.0)
MCV: 85.6 fl (ref 78.0–100.0)
Platelets: 228 10*3/uL (ref 150.0–400.0)
RBC: 4.67 Mil/uL (ref 4.22–5.81)
RDW: 15.3 % (ref 11.5–15.5)
WBC: 10.2 10*3/uL (ref 4.0–10.5)

## 2014-04-24 LAB — COMPREHENSIVE METABOLIC PANEL
ALK PHOS: 62 U/L (ref 39–117)
ALT: 16 U/L (ref 0–53)
AST: 16 U/L (ref 0–37)
Albumin: 4.1 g/dL (ref 3.5–5.2)
BILIRUBIN TOTAL: 0.4 mg/dL (ref 0.2–1.2)
BUN: 14 mg/dL (ref 6–23)
CO2: 30 meq/L (ref 19–32)
CREATININE: 1.17 mg/dL (ref 0.40–1.50)
Calcium: 9.1 mg/dL (ref 8.4–10.5)
Chloride: 105 mEq/L (ref 96–112)
GFR: 64.43 mL/min (ref >60.00)
Glucose, Bld: 102 mg/dL — ABNORMAL HIGH (ref 70–99)
Potassium: 3.8 mEq/L (ref 3.5–5.1)
SODIUM: 144 meq/L (ref 135–145)
TOTAL PROTEIN: 6.7 g/dL (ref 6.0–8.3)

## 2014-04-24 LAB — LDL CHOLESTEROL, DIRECT: LDL DIRECT: 91 mg/dL

## 2014-04-25 ENCOUNTER — Other Ambulatory Visit: Payer: Self-pay

## 2014-04-25 MED ORDER — FLUTICASONE PROPIONATE 50 MCG/ACT NA SUSP: 2.0000 | Freq: Every day | NASAL | Status: DC

## 2014-04-27 LAB — HM DIABETES EYE EXAM

## 2014-05-01 ENCOUNTER — Telehealth: Payer: Self-pay | Admitting: Family Medicine

## 2014-05-01 NOTE — Telephone Encounter (Signed)
See note below

## 2014-05-01 NOTE — Telephone Encounter (Signed)
Agree with you. Should be 20mg .

## 2014-05-01 NOTE — Telephone Encounter (Signed)
Ref no [id number] Pt just got the  quinapril (ACCUPRIL) 20 MG tablet on 27/17. [facility] mail states pt is trying to refill the quinapril (ACCUPRIL) 40 MG tablet. They need clarification does pt need this rx?

## 2014-05-01 NOTE — Telephone Encounter (Signed)
Per your last OV note and looking at pt medication hx I dont see where pt has ever been on 40mg  of this medication. Am i overlooking something?

## 2014-05-02 ENCOUNTER — Telehealth: Payer: Self-pay | Admitting: Family Medicine

## 2014-05-02 ENCOUNTER — Emergency Department (HOSPITAL_COMMUNITY)
Admission: EM | Admit: 2014-05-02 | Discharge: 2014-05-02 | Disposition: A | Discharge status: Home / Self Care | Payer: Medicare Other | Source: Home / Self Care | Attending: Family Medicine | Admitting: Family Medicine

## 2014-05-02 ENCOUNTER — Encounter (HOSPITAL_COMMUNITY): Payer: Self-pay

## 2014-05-02 DIAGNOSIS — I951 Orthostatic hypotension: Secondary | ICD-10-CM | POA: Diagnosis not present

## 2014-05-02 MED ORDER — CARVEDILOL 25 MG PO TABS: 12.5000 mg | ORAL_TABLET | Freq: Two times a day (BID) | ORAL | Status: DC

## 2014-05-02 NOTE — Telephone Encounter (Signed)
See below

## 2014-05-02 NOTE — Discharge Instructions (Signed)
Cut your coreg in half and take 1/2 pill twice daily. You may skip your coreg today.  Drink plenty of fluids  Take your time getting up from chairs or out of bed.   Call your doctors office today to be seen next week.   Orthostatic Hypotension Orthostatic hypotension is a sudden drop in blood pressure. It happens when you quickly stand up from a seated or lying position. You may feel dizzy or light-headed. This can last for just a few seconds or for up to a few minutes. It is usually not a serious problem. However, if this happens frequently or gets worse, it can be a sign of something more serious. CAUSES  Different things can cause orthostatic hypotension, including:   Loss of body fluids (dehydration).  Medicines that lower blood pressure.  Sudden changes in posture, such as standing up quickly after you have been sitting or lying down.  Taking too much of your medicine. SIGNS AND SYMPTOMS   Light-headedness or dizziness.   Fainting or near-fainting.   A fast heart rate.   Weakness.   Feeling tired (fatigue).  DIAGNOSIS  Your health care provider may do several things to help diagnose your condition and identify the cause. These may include:   Taking a medical history and doing a physical exam.  Checking your blood pressure. Your health care provider will check your blood pressure when you are:  Lying down.  Sitting.  Standing.  Using tilt table testing. In this test, you lie down on a table that moves from a lying position to a standing position. You will be strapped onto the table. This test monitors your blood pressure and heart rate when you are in different positions. TREATMENT  Treatment will vary depending on the cause. Possible treatments include:   Changing the dosage of your medicines.  Wearing compression stockings on your lower legs.  Standing up slowly after sitting or lying down.  Eating more salt.  Eating frequent, small meals.  In some  cases, getting IV fluids.  Taking medicine to enhance fluid retention. HOME CARE INSTRUCTIONS  Only take over-the-counter or prescription medicines as directed by your health care provider.  Follow your health care provider's instructions for changing the dosage of your current medicines.  Do not stop or adjust your medicine on your own.  Stand up slowly after sitting or lying down. This allows your body to adjust to the different position.  Wear compression stockings as directed.  Eat extra salt as directed.  Do not add extra salt to your diet unless directed to by your health care provider.  Eat frequent, small meals.  Avoid standing suddenly after eating.  Avoid hot showers or excessive heat as directed by your health care provider.  Keep all follow-up appointments. SEEK MEDICAL CARE IF:  You continue to feel dizzy or light-headed after standing.  You feel groggy or confused.  You feel cold, clammy, or sick to your stomach (nauseous).  You have blurred vision.  You feel short of breath. SEEK IMMEDIATE MEDICAL CARE IF:   You faint after standing.  You have chest pain.  You have difficulty breathing.   You lose feeling or movement in your arms or legs.   You have slurred speech or difficulty talking, or you are unable to talk.  MAKE SURE YOU:   Understand these instructions.  Will watch your condition.  Will get help right away if you are not doing well or get worse. Document Released: 02/11/2002 Document  Revised: 02/26/2013 Document Reviewed: 12/14/2012 [facility] Patient Information 2015 [place], [place]. This information is not intended to replace advice given to you by your health care provider. Make sure you discuss any questions you have with your health care provider.

## 2014-05-02 NOTE — ED Provider Notes (Signed)
CSN: [id number]     Arrival date & time 05/02/14  0932 History   First MD Initiated Contact with Patient 05/02/14 0932     Chief Complaint  Patient presents with  . Dizziness   (Consider location/radiation/quality/duration/timing/severity/associated sxs/prior Treatment) HPI   Patient here with complaints of dizziness starting this am. He says that he woke up around 3 am and had some dizziness described predominately as unsteadiness with perhaps some element of room spinning sensation. When he got out of bed later it was milder but then returned when he stood up from his chair making him feel unsteady. He does not feel completely better now but feels slightly unsteady. In Nov he was seen in [place], when he was on a trip, and diagnosed with R sided OM and treated with antibiotics. He has improved from that and states his R ear feels slightly funny but not painful. He denies muffled or change in hearing as well. He denies fever, chills, sweats, change in appetite, dyspnea, and chest pain.   Past Medical History  Diagnosis Date  . Diabetes mellitus   . Hypertension   . Hyperlipidemia   . Hypothyroidism   . Seasonal allergies   . Glaucoma   . Arthritis   . Blood transfusion     2010 because of stomach ulcers  . Stomach ulcer 2010    bleeding ulcer result NSAIDS   Past Surgical History  Procedure Laterality Date  . Lumbar laminectomy  2007  . Reconstruction tendon pulley w/ tendon / fascial graft of hand / finger  1982    rt 3rd finger  . Reconstruction tendon pulley w/ tendon / fascial graft of hand / finger  1953    rt 5th finger   Family History  Problem Relation Age of Onset  . Colon cancer Neg Hx   . Esophageal cancer Neg Hx   . Stomach cancer Neg Hx   . Diabetes Mother   . Diabetes Father   . Hyperlipidemia Father   . Brain cancer Brother     smoker   History  Substance Use Topics  . Smoking status: Former Smoker    Quit date: 02/03/2001  . Smokeless tobacco: Never  Used     Comment: occasoinal cigars previously  . Alcohol Use: 4.2 oz/week    7 Cans of beer per week    Review of Systems   Per HPI  Allergies  Amoxicillin-pot clavulanate and Sulfa antibiotics  Home Medications   Prior to Admission medications   Medication Sig Start Date End Date Taking? Authorizing Provider  acetaminophen (TYLENOL) 500 MG tablet Take 500 mg by mouth every 6 (six) hours as needed.      Historical Provider, MD  carvedilol (COREG) 25 MG tablet Take 0.5 tablets (12.5 mg total) by mouth 2 (two) times daily with a meal. 05/02/14   Timmothy Euler, MD  Cholecalciferol (VITAMIN D) 2000 UNITS CAPS Take by mouth daily.      Historical Provider, MD  diltiazem (CARDIZEM CD) 180 MG 24 hr capsule Take 1 capsule (180 mg total) by mouth 2 (two) times daily. 04/23/14   Marin Olp, MD  fluticasone (FLONASE) 50 MCG/ACT nasal spray Place 2 sprays into both nostrils daily. 04/25/14   Marin Olp, MD  Glucosamine-Chondroit-Vit C-Mn (GLUCOSAMINE 1500 COMPLEX PO) Take by mouth daily.      Historical Provider, MD  latanoprost (XALATAN) 0.005 % ophthalmic solution Place 1 drop into both eyes at bedtime.  01/21/11   Historical  Provider, MD  levothyroxine (SYNTHROID) 50 MCG tablet Take 1 tablet (50 mcg total) by mouth daily. 04/23/14   Marin Olp, MD  Multiple Vitamin (MULTIVITAMIN) tablet Take 1 tablet by mouth daily.      Historical Provider, MD  niacin 500 MG tablet Take 500 mg by mouth daily with breakfast.      Historical Provider, MD  Omega-3 Fatty Acids (FISH OIL) 1200 MG CAPS Take by mouth daily.      Historical Provider, MD  omeprazole (PRILOSEC) 20 MG capsule Take 20 mg by mouth daily.      Historical Provider, MD  pioglitazone (ACTOS) 30 MG tablet Take 1 tablet (30 mg total) by mouth daily. 04/23/14   Marin Olp, MD  pravastatin (PRAVACHOL) 40 MG tablet Take 1 tablet (40 mg total) by mouth daily. 04/23/14   Marin Olp, MD  quinapril (ACCUPRIL) 20 MG tablet  Take 1 tablet (20 mg total) by mouth daily. 04/23/14   Marin Olp, MD   BP 159/66 mmHg  Pulse 53  Temp(Src) 98 F (36.7 C) (Oral)  Resp 16  SpO2 97% Physical Exam  Constitutional: He appears well-developed and well-nourished. No distress.  HENT:  Head: Normocephalic and atraumatic.  R TM WNL after irrigation, L TM WNL  On dix hallpike has severe symptoms of unsteadiness when he sits back up, test negative for reproduction of symptoms with lying back or nystagmus.   Eyes: Conjunctivae and EOM are normal.  Neck: Neck supple.  Cardiovascular: Regular rhythm and normal heart sounds.   brady  Pulmonary/Chest: Effort normal and breath sounds normal. No respiratory distress. He has no wheezes.  Lymphadenopathy:    He has no cervical adenopathy.  Vitals reviewed.   Orthostatic Lying  - BP- Lying: 182/89 mmHg ; Pulse- Lying: 54  Orthostatic Sitting - BP- Sitting: 177/89 mmHg ; Pulse- Sitting: 52  Orthostatic Standing at 0 minutes - BP- Standing at 0 minutes: 172/63 mmHg ; Pulse- Standing at 0 minutes: 53  ED Course  Procedures (including critical care time) Labs Review Labs Reviewed - No data to display  Imaging Review No results found.   MDM   1. Orthostatic hypotension    74 y/o male here with dizziness consistent with orthostatic hypotension. On exam his TMs are WNL and he is in NAD. He had symptoms not with lying down but with sitting up on Dix hallpike maneuver making me suspicious. He is hypertensive today as he hasnt taken his medications but has a dip of 26 mmHG in diastolic pressure on orthostatic testing which is when his symptoms are reproduced. Notably his HR is slightly bradycardic indicative of good beta blockade, given his symptoms and no increase in HR with standing will back off slightly from his coreg. We could also consider backing off of his diltiazem instead or in addition to the BB, but I will leave this to his PCP on f/u. Reccommended follow up in 1 week,  pt will call today.   Pt seen and discussed with Dr. Saralyn Pilar and he agrees with the plan as discussed above.      Timmothy Euler, MD 05/02/14 1018

## 2014-05-02 NOTE — Telephone Encounter (Signed)
Pt was seen at [facility] and per pt needs post urgent care follow up due to they made changes to his med. Can I create 30 min slot next wk?

## 2014-05-02 NOTE — Telephone Encounter (Signed)
Left VM on pharmacy machine that pt should only be on 20mg 

## 2014-05-02 NOTE — ED Notes (Signed)
C/o when he woke this AM at 3 to go to the toilet, he was a little dizzy, and again this AM at 6, feels as if he has another case of vertigo. Hist of chronic bronchiolitis w congestion, cough.

## 2014-05-03 NOTE — Telephone Encounter (Signed)
yes

## 2014-05-05 NOTE — Telephone Encounter (Signed)
Pt has been sch for 05-08-14

## 2014-05-08 ENCOUNTER — Ambulatory Visit (INDEPENDENT_AMBULATORY_CARE_PROVIDER_SITE_OTHER): Payer: Medicare Other | Admitting: Family Medicine

## 2014-05-08 ENCOUNTER — Encounter: Payer: Self-pay | Admitting: Family Medicine

## 2014-05-08 VITALS — BP 140/68 | HR 65 | Temp 98.1°F | Ht 68.0 in | Wt 233.0 lb

## 2014-05-08 DIAGNOSIS — R42 Dizziness and giddiness: Secondary | ICD-10-CM

## 2014-05-08 DIAGNOSIS — I10 Essential (primary) hypertension: Secondary | ICD-10-CM

## 2014-05-08 MED ORDER — FLUTICASONE PROPIONATE 50 MCG/ACT NA SUSP: 2.0000 | Freq: Every day | NASAL | Status: DC

## 2014-05-08 NOTE — Assessment & Plan Note (Signed)
Dizziness as well/orthostatic hypotension noted at ED-dizziness has resolved after ear irrigation and lower BP med load.   Bp is mildly poor controlled given diabetes. Despite this, I am hesitant to increase BP medications with recent dizziness. Prior he was bradycardic so if did increase would go up on quinapril instead of carvedilol. Home monitoring and if over 145 regularly then would plan to increase.   Dizziness may also have been caused by wax which was irrigated. Slight amount today but near eardrum and we discussed mineral oil use at home.

## 2014-05-08 NOTE — Progress Notes (Signed)
Garret Reddish, MD Phone: [phone]  Subjective:   Eddie Ferguson is a 74 y.o. year old very pleasant male patient who presents with the following:  Hypertension-very mild poor control Dizziness-much improved  BP Readings from Last 3 Encounters:  05/08/14 140/68  05/02/14 159/66  04/23/14 138/80  Seen in urgent care 05/02/14 for dizziness. Bp at the time was 182/89 (did not take meds that day as had been dizzy) when preforming orthostatics and idiastolic dropped >07 points. HR did not increase. Thought was perhaps too far beta blocked down.  Patient seemed to think most of his issues were coming from his ears due to fullness and dizziness that resolved after irrigation.   Compliant with medications-yes without side effects, including recent decrease in carvedilol ROS-Denies any CP, HA, SOB, blurry vision, LE edema, transient weakness, orthopnea, PND.   Past Medical History- Patient Active Problem List   Diagnosis Date Noted  . Diabetes mellitus type 2, controlled 04/23/2014    Priority: High  . Essential hypertension 04/23/2014    Priority: Medium  . Hyperlipidemia 04/23/2014    Priority: Medium  . Hypothyroidism 04/23/2014    Priority: Medium  . Stomach ulcer     Priority: Medium  . GERD (gastroesophageal reflux disease) 04/23/2014    Priority: Low  . Allergic rhinitis 04/23/2014    Priority: Low  . Glaucoma 04/23/2014    Priority: Low   Medications- reviewed and updated Current Outpatient Prescriptions  Medication Sig Dispense Refill  . acetaminophen (TYLENOL) 500 MG tablet Take 500 mg by mouth every 6 (six) hours as needed.      . carvedilol (COREG) 25 MG tablet Take 0.5 tablets (12.5 mg total) by mouth 2 (two) times daily with a meal. 180 tablet 3  . Cholecalciferol (VITAMIN D) 2000 UNITS CAPS Take by mouth daily.      Marland Kitchen diltiazem (CARDIZEM CD) 180 MG 24 hr capsule Take 1 capsule (180 mg total) by mouth 2 (two) times daily. (Patient taking differently: Take by  mouth 2 (two) times daily. ) 180 capsule 3  . fluticasone (FLONASE) 50 MCG/ACT nasal spray Place 2 sprays into both nostrils daily. 48 g 3  . Glucosamine-Chondroit-Vit C-Mn (GLUCOSAMINE 1500 COMPLEX PO) Take by mouth daily.      Marland Kitchen latanoprost (XALATAN) 0.005 % ophthalmic solution Place 1 drop into both eyes at bedtime.     Marland Kitchen levothyroxine (SYNTHROID) 50 MCG tablet Take 1 tablet (50 mcg total) by mouth daily. 90 tablet 3  . Multiple Vitamin (MULTIVITAMIN) tablet Take 1 tablet by mouth daily.      . niacin 500 MG tablet Take 500 mg by mouth daily with breakfast.      . Omega-3 Fatty Acids (FISH OIL) 1200 MG CAPS Take by mouth daily.      Marland Kitchen omeprazole (PRILOSEC) 20 MG capsule Take 20 mg by mouth daily.      . pioglitazone (ACTOS) 30 MG tablet Take 1 tablet (30 mg total) by mouth daily. 90 tablet 3  . pravastatin (PRAVACHOL) 40 MG tablet Take 1 tablet (40 mg total) by mouth daily. 90 tablet 3  . quinapril (ACCUPRIL) 20 MG tablet Take 1 tablet (20 mg total) by mouth daily. 90 tablet 3   Objective: BP 140/68 mmHg  Pulse 65  Temp(Src) 98.1 F (36.7 C) (Oral)  Ht 5\' 8"  (1.727 m)  Wt 233 lb (105.688 kg)  BMI 35.44 kg/m2  SpO2 97% Gen: NAD, resting comfortably CV: RRR no murmurs rubs or gallops Lungs: CTAB no crackles,  wheeze, rhonchi Abdomen: soft/nontender/nondistended/normal bowel sounds. No rebound or guarding.  Ext: no edema Skin: warm, dry Neuro: grossly normal, moves all extremities  Assessment/Plan:  Essential hypertension Dizziness as well/orthostatic hypotension noted at ED-dizziness has resolved after ear irrigation and lower BP med load.   Bp is mildly poor controlled given diabetes. Despite this, I am hesitant to increase BP medications with recent dizziness. Prior he was bradycardic so if did increase would go up on quinapril instead of carvedilol. Home monitoring and if over 145 regularly then would plan to increase.   Dizziness may also have been caused by wax which was  irrigated. Slight amount today but near eardrum and we discussed mineral oil use at home.    PRN for new or worsening symptoms or BP elevation at home. 3-4 month follow up otherwise.   Meds ordered this encounter  Medications  . fluticasone (FLONASE) 50 MCG/ACT nasal spray    Sig: Place 2 sprays into both nostrils daily.    Dispense:  48 g    Refill:  3    Do not fill any medications without patient request.

## 2014-05-08 NOTE — Patient Instructions (Signed)
Glad the dizziness is better. Sounds like it was related to your ear as well as blood pressure medications.   Try mineral oil to keep the ear in better shape. A few drops on days where it starts to feel full for at least 30 seconds then drain. Maintenance could be once a week use.   Your blood pressure is a hair high today but I am hesitant to increase given your prior dizziness. If it stays up at follow up, we will increase the quinapril likely.   Check at least once a week and bring me a log. If you ever get over 145 on the top # more than 2 in a row, come see me   See me in 3-4 months  Hope you enjoy the games this weekend!

## 2014-05-23 ENCOUNTER — Encounter: Payer: Self-pay | Admitting: Family Medicine

## 2014-05-28 ENCOUNTER — Encounter: Payer: Self-pay | Admitting: Family Medicine

## 2014-05-28 DIAGNOSIS — C4491 Basal cell carcinoma of skin, unspecified: Secondary | ICD-10-CM

## 2014-05-28 DIAGNOSIS — Z8546 Personal history of malignant neoplasm of prostate: Secondary | ICD-10-CM | POA: Insufficient documentation

## 2014-05-28 DIAGNOSIS — N529 Male erectile dysfunction, unspecified: Secondary | ICD-10-CM | POA: Insufficient documentation

## 2014-05-28 DIAGNOSIS — C61 Malignant neoplasm of prostate: Secondary | ICD-10-CM | POA: Insufficient documentation

## 2014-05-28 DIAGNOSIS — M1990 Unspecified osteoarthritis, unspecified site: Secondary | ICD-10-CM | POA: Insufficient documentation

## 2014-05-28 DIAGNOSIS — D126 Benign neoplasm of colon, unspecified: Secondary | ICD-10-CM | POA: Insufficient documentation

## 2014-05-28 HISTORY — DX: Basal cell carcinoma of skin, unspecified: C44.91

## 2014-05-29 ENCOUNTER — Encounter: Payer: Self-pay | Admitting: Family Medicine

## 2014-05-29 LAB — PSA
PSA, FREE: 5.35
PSA, FREE: 5.71
PSA, FREE: 8.75

## 2014-08-06 ENCOUNTER — Encounter: Payer: Self-pay | Admitting: Medical Oncology

## 2014-08-06 ENCOUNTER — Telehealth: Payer: Self-pay | Admitting: Medical Oncology

## 2014-08-06 NOTE — Progress Notes (Signed)
Oncology Nurse Navigator Documentation  Oncology Nurse Navigator Flowsheets 08/06/2014  Referral date to RadOnc/MedOnc 08/06/2014  Navigator Encounter Type Introductory phone call  Time Spent with Patient 15   I called pt to introduce myself as the Prostate Nurse Navigator and the Coordinator of the Prostate [facility].  1. I confirmed with the patient he is aware of his referral to the clinic June 10 arriving at 7:30am.  2. I discussed the format of the clinic and the physicians he will be seeing that day.  3. I discussed where the clinic is located and how to contact me.  4. I confirmed his address and informed him I would be mailing a packet of information and forms to be completed. I asked him to bring them with him the day of his appointment.   He voiced understanding of the above. I asked him to call me if he has any questions or concerns regarding his appointments or the forms he needs to complete.   Cira Rue, RN, BSN, [place]  [phone]  Fax [phone]

## 2014-08-06 NOTE — Telephone Encounter (Signed)
I called pt with appointment to the [facility] 08[facility]15[facility]14 arriving at 7:30am He states he is working and asked that I include this information in the packet of forms I will be mailing him. I told him I would happy to. I asked him to call me with any concerns or questions. He voiced understanding.    Cira Rue, RN, BSN, [place]  [phone] Fax [phone]

## 2014-08-13 ENCOUNTER — Encounter: Payer: Self-pay | Admitting: Radiation Oncology

## 2014-08-13 NOTE — Progress Notes (Signed)
GU Location of Tumor / Histology: adenocarcinoma of the prostate   If Prostate Cancer, Gleason Score is (3 + 4) and PSA is (9.19) on 06/12/2014.  Eddie Ferguson. Renwick presented in 2013 with an elevated PSA of 5.9. He decided to proceed with an initial course of active surveillance and accepted a greater risk of disease progression in order to preserve his quality of life.   Biopsies of prostate (if applicable) revealed:    Past/Anticipated interventions by urology, if any: prostate biopsy, active surveillance, referral to radiation oncology  Past/Anticipated interventions by medical oncology, if any: no  Weight changes, if any: no  Bowel/Bladder complaints, if any: minimal voiding symptoms with an IPSS of 3 on 06/18/2014 at Dr. Lynne Logan office   Nausea/Vomiting, if any: no  Pain issues, if any:  no  SAFETY ISSUES:  Prior radiation? no  Pacemaker/ICD? no  Possible current pregnancy? no  Is the patient on methotrexate? no  Current Complaints / other details:  74 year old male. Reports his father had prostate ca. He is divorced but is currently living with his girlfriend. Customer service rep.

## 2014-08-14 ENCOUNTER — Encounter: Payer: Self-pay | Admitting: Medical Oncology

## 2014-08-14 ENCOUNTER — Telehealth: Payer: Self-pay | Admitting: Medical Oncology

## 2014-08-14 NOTE — Telephone Encounter (Signed)
I left Eddie Ferguson a message to remind him of his appointment 6/10 arriving at 7:30 for the Prostate [place]. I asked if would kindly return my call to confirm.   Cira Rue, RN, BSN, [place]  [phone] Fax [phone]

## 2014-08-14 NOTE — Progress Notes (Signed)
Oncology Nurse Navigator Documentation  Oncology Nurse Navigator Flowsheets 08/06/2014 08/14/2014  Referral date to RadOnc/MedOnc 08/06/2014 -  Navigator Encounter Type Introductory phone call Telephone- called pt and left a message to remind him of his appointment in the AM for the Prostate [place].  Time Spent with Patient 15 -  -

## 2014-08-15 ENCOUNTER — Ambulatory Visit (HOSPITAL_BASED_OUTPATIENT_CLINIC_OR_DEPARTMENT_OTHER): Payer: Medicare Other | Admitting: Oncology

## 2014-08-15 ENCOUNTER — Encounter: Payer: Self-pay | Admitting: Radiation Oncology

## 2014-08-15 ENCOUNTER — Encounter: Payer: Self-pay | Admitting: General Practice

## 2014-08-15 ENCOUNTER — Encounter: Payer: Self-pay | Admitting: Medical Oncology

## 2014-08-15 ENCOUNTER — Ambulatory Visit
Admission: RE | Admit: 2014-08-15 | Discharge: 2014-08-15 | Disposition: A | Discharge status: Home / Self Care | Payer: Medicare Other | Source: Ambulatory Visit | Attending: Radiation Oncology | Admitting: Radiation Oncology

## 2014-08-15 VITALS — BP 126/50 | HR 60 | Temp 98.0°F | Resp 18 | Ht 69.0 in | Wt 227.6 lb

## 2014-08-15 DIAGNOSIS — C61 Malignant neoplasm of prostate: Secondary | ICD-10-CM | POA: Diagnosis not present

## 2014-08-15 NOTE — Addendum Note (Signed)
Encounter addended by: Raynelle Bring, MD on: 08/15/2014 11:57 AM<BR>     Documentation filed: Clinical Notes

## 2014-08-15 NOTE — Progress Notes (Signed)
                               Care Plan Summary  Name: Mr. Claud Gowan DOB: [birth year]  Your Medical Team:   Urologist -  Dr. Raynelle Bring, [facility]  Radiation Oncologist - Dr.Matthew Micki [facility]   Medical Oncologist - Dr. Zola Button, [place]  Recommendations: 1) Androgen Deprivation Therapy ( Hormone Therapy) 2) Radiation   * These recommendations are based on information available as of today's consult.      Recommendations may change depending on the results of further tests or exams.  Next Steps: 1) Dr. Lynne Logan office will schedule you for gold markers  2) PSA redrawn early fall with Dr. Alinda Money 3) Dr. Johny Shears office will schedule radiation  When appointments need to be scheduled, you will be contacted by [facility] and/or [facility].  Questions?  Please do not hesitate to call Cira Rue, RN, BSN, CRNI at [phone] any questions or concerns.  Shirlean Mylar is your Oncology Nurse Navigator and is available to assist you while you're receiving your medical care at [facility].

## 2014-08-15 NOTE — Progress Notes (Signed)
Hematology and Oncology Follow Up Visit  Eddie Ferguson. Gedney [id number] 30-Sep-1940 74 y.o. 08/15/2014 9:56 AM Eddie Ferguson, MDHunter, Eddie Mars, MD   Principle Diagnosis: 74 year old gentleman diagnosed with a prostate cancer in 2013. He had a PSA of 5.9 and a biopsy showed Gleason score 3+3 = 6. He did have one core of 3+4 = 7.   Prior Therapy: He has been under observation and surveillance since that time. He had multiple biopsies under the care of Dr. Alinda Ferguson. Recently his PSA is up to 9.19.  Current therapy: Under evaluation for definitive therapy.  Interim History: Eddie Ferguson presents today for a follow-up visit to the prostate cancer multidisciplinary clinic. He is a gentleman I saw in consultation in August 2013 around the time of his diagnosis. Since that time, he has been under active surveillance under the care of Dr. Alinda Ferguson. Most recently, his PSA was up to 9.19 and a repeat biopsy on 08/05/2014 showed increase of his tumor burden especially at the core of the apex with a Gleason score 3+4 = 7 with 80% involvement of the cancer. There is another core that had a 3+4 = 7 prostate cancer with 50% involvement. The rest of the core showed less than 30% prostate cancer involvement. He is MRI obtained in November 2013 showed disease at the left apex without any extra prostatic extension or lymphadenopathy. He was referred to the prostate cancer multidisciplinary clinic for further discussion.  Clinically he is completely asymptomatic at this time. He does not report any headaches, blurry vision, syncope or seizures. He does not report any fevers, chills, sweats. He does not report any chest pain or palpitation orthopnea. He does not report any cough or hemoptysis. He does not report any nausea or vomiting or abdominal pain. He does not report any frequency, urgency, hesitancy or hematuria. He does not report any dysuria or nocturia. He is very satisfied with his urinary function at this time. He  continues to be relatively active and performs activities of daily living. The remaining review of systems unremarkable.  Medications: I have reviewed the patient's current medications.  Current Outpatient Prescriptions  Medication Sig Dispense Refill  . acetaminophen (TYLENOL) 500 MG tablet Take 500 mg by mouth every 6 (six) hours as needed.      . carvedilol (COREG) 25 MG tablet Take 0.5 tablets (12.5 mg total) by mouth 2 (two) times daily with a meal. 180 tablet 3  . Cholecalciferol (VITAMIN D) 2000 UNITS CAPS Take by mouth daily.      Marland Kitchen diltiazem (CARDIZEM CD) 180 MG 24 hr capsule Take 1 capsule (180 mg total) by mouth 2 (two) times daily. (Patient taking differently: Take by mouth 2 (two) times daily. ) 180 capsule 3  . dorzolamide (TRUSOPT) 2 % ophthalmic solution   2  . fexofenadine (ALLEGRA) 180 MG tablet Take 180 mg by mouth.    . fluticasone (FLONASE) 50 MCG/ACT nasal spray Place 2 sprays into both nostrils daily. 48 g 3  . Glucosamine-Chondroit-Vit C-Mn (GLUCOSAMINE 1500 COMPLEX PO) Take by mouth daily.      . Glucosamine-Chondroit-Vit C-Mn (GLUCOSAMINE-CHONDROITIN) CAPS Take 1,500 mg by mouth.    . latanoprost (XALATAN) 0.005 % ophthalmic solution Place 1 drop into both eyes at bedtime.     Marland Kitchen levofloxacin (LEVAQUIN) 500 MG tablet TAKE 1 TABLET DAILY BEGIN 1 DAY PRIOR TO PROCEDURE.  0  . levothyroxine (SYNTHROID) 50 MCG tablet Take 1 tablet (50 mcg total) by mouth daily. 90 tablet 3  .  methocarbamol (ROBAXIN) 750 MG tablet     . Multiple Vitamin (MULTIVITAMIN) tablet Take 1 tablet by mouth daily.      . niacin 500 MG tablet Take 500 mg by mouth daily with breakfast.      . Omega-3 Fatty Acids (FISH OIL) 1200 MG CAPS Take by mouth daily.      Marland Kitchen omeprazole (PRILOSEC) 20 MG capsule Take 20 mg by mouth daily.      . pioglitazone (ACTOS) 30 MG tablet Take 1 tablet (30 mg total) by mouth daily. 90 tablet 3  . pioglitazone (ACTOS) 30 MG tablet Take by mouth.    . pravastatin (PRAVACHOL)  40 MG tablet Take 1 tablet (40 mg total) by mouth daily. 90 tablet 3  . pravastatin (PRAVACHOL) 40 MG tablet Take by mouth.    . quinapril (ACCUPRIL) 20 MG tablet Take 1 tablet (20 mg total) by mouth daily. 90 tablet 3  . timolol (TIMOPTIC) 0.5 % ophthalmic solution   0  . Timolol Maleate 0.5 % (DAILY) SOLN      No current facility-administered medications for this visit.     Allergies:  Allergies  Allergen Reactions  . Aspirin Other (See Comments)    Stomach ulcers  . Nsaids Other (See Comments)    Stomach ulcers  . Amoxicillin-Pot Clavulanate Rash  . Sulfa Antibiotics Swelling and Rash    Past Medical History, Surgical history, Social history, and Family History were reviewed and updated.   Physical Exam: ECOG: 0 General appearance: alert and cooperative Head: Normocephalic, without obvious abnormality Neck: no adenopathy Lymph nodes: Cervical, supraclavicular, and axillary nodes normal. Heart:regular rate and rhythm, S1, S2 normal, no murmur, click, rub or gallop Lung:chest clear, no wheezing, rales, normal symmetric air entry Abdomin: soft, non-tender, without masses or organomegaly EXT:no erythema, induration, or nodules   Lab Results: Lab Results  Component Value Date   WBC 10.2 04/23/2014   HGB 13.4 04/23/2014   HCT 39.9 04/23/2014   MCV 85.6 04/23/2014   PLT 228.0 04/23/2014     Chemistry      Component Value Date/Time   NA 144 04/23/2014 1612   K 3.8 04/23/2014 1612   CL 105 04/23/2014 1612   CO2 30 04/23/2014 1612   BUN 14 04/23/2014 1612   CREATININE 1.17 04/23/2014 1612      Component Value Date/Time   CALCIUM 9.1 04/23/2014 1612   ALKPHOS 62 04/23/2014 1612   AST 16 04/23/2014 1612   ALT 16 04/23/2014 1612   BILITOT 0.4 04/23/2014 1612       Radiological Studies:  Clinical Data: Prostate cancer diagnosed 07/2011  MR PROSTATE WITHOUT AND WITH CONTRAST  Technique: Multiplanar multisequence MRI images were obtained of the pelvis,  centered about the prostate, using both external and endorectal coils. Pre and post contrast images were obtained.  Contrast: 68mL MULTIHANCE GADOBENATE DIMEGLUMINE 529 MG/ML IV SOLN  Comparison: None  FINDINGS:  Prostate: Suspected macroscopic tumor in the left prostate apex (series 14/image 21). Early arterial enhancement (series 1904/image 55). Associated restricted diffusion (series 1800/image 11), suggesting higher grade tumor. Capsular bulge without definite extracapsular extension.  Additional focus of suspected macroscopic tumor in the right mid gland to base (series 14/images 15 and 16). No restricted diffusion. Capsular bulge without definite extracapsular extension.  Transcapsular spread: No gross extracapsular extension.  Seminal vesicle involvement: Absent.  Neurovascular bundle involvement: Absent.  Pelvic adenopathy: Absent.  Bone metastasis: Absent.  Other findings:None.  IMPRESSION: Suspected macroscopic tumor in the left prostate  apex, possibly higher grade.  Suspected macroscopic tumor in the right mid gland to base.  No gross extracapsular extension. No seminal vesicle or neurovascular bundle involvement. No pelvic adenopathy or osseous metastases.  Impression and Plan:  74 year old gentleman with a prostate cancer dating back to May 2013. A Gleason score 3+4 = 7 and a PSA of 5.9. He has been on active surveillance since that time with repeat biopsies under the care of Dr. Alinda Ferguson. His most recent PSA was up to 9.19 and a repeat biopsy in 08/05/2014 showed increase in his prostate cancer volume especially at the apex with a Gleason score 3+4 = 7 and 80% involvement of that core.  His case was discussed today in the prostate cancer multidisciplinary clinic. His pathology and radiology were reviewed as well. Options of treatment were discussed with the patient and his wife today. I explained to him that he is at a point where  definitive treatment and with curative intent has to BE done around this time otherwise he is risking further cancer progression and potentially incurable disease. I explained to them if his cancer does spread beyond the prostate he is potentially facing incurable cancer and certainly would be palliated appropriately.  His options would be definitive treatment likely with radiation therapy plus or minus hormone therapy. Alternatively continued observation and surveillance and using hormone therapy as a salvage regimen upon symptomatic disease would be an option if he wants to defer any treatment. I explained to him that the toxicity of radiation therapy is reasonably manageable and will offer him the best chance of cure. Once this cancer becomes metastatic, there is no chance of cure at that time.  The rationale of definitive treatment versus no definitive treatment were discussed today extensively and questions were answered today. He will think about all that information and weak up his mind in the near future.    [facility], MD 6/10/20169:56 AM

## 2014-08-15 NOTE — Consult Note (Signed)
Reason For Visit  Reason for visit: To discuss treatment options for prostate cancer. Location of visit: [facility]   History of Present Illness  Eddie Ferguson is a 74 year old gentleman with clinically localized prostate cancer. He underwent a urologic evaluation back in the spring of 2013 by Dr. Harrison Mons for an elevated PSA of 5.9. He was also noted to have a nodular prostate and underwent a prostate needle biopsy under transrectal ultrasound guidance on 07/22/11. He underwent a 14 core biopsy with 3 cores noted to be positive for adenocarcinoma of the prostate. He had 2 cores on the left positive for Gleason 3+3 = 6 adenocarcinoma and one core positive on the right for Gleason 3+4 = 7 adenocarcinoma. His prostate was measured at 25 cc. He does have a family history of prostate cancer although his father was diagnosed incidentally at autopsy and was not known to have clinically significant disease. He has been counseled by Dr. Redmond Pulling, Dr. Mariane Masters (radiation oncology), and Dr. Alen Blew and is very well informed about his prostate cancer and options for management/treatment. After our initial consult in October 2013, he decided to proceed with an initial course of active surveillance.  He has elected to accept a greater risk of disease progression in order to preserve his quality of life.  Initial diagnoses: 07/22/11 TNM stage: cT2b Nx Mx PSA at diagnosis: 5.9 Gleason score: 3+4 = 7 Biopsy (07/22/11, read by Dr. Kennith Maes, [facility]): 3/14 cores positive   Left: Left apex (20%, 3+3 = 6), left mid (30%, 3+3 = 6)   Right: Right mid (50%, 3+4 = 7) Prostate volume: 25 cc  Surveillance: Nov 2013: MRI - disease at L apex, R mid/base, No EPE, SVI, LAD, or anteriorly located tumor Dec 2013: 26 core biopsy - 4/26 cores positive: L apex (30%, 3+3=6), L mid (10% and 20%, 3+3=6), R lateral apex (40%, 3+4=7),            Vol  22.4 cc, PSAD 0.28 Jul 2013: 12 core biopsy - 5/12 cores: L apex (30%, 30% - 3+3=6), L mid (50%, 3+3=6), R apex (5%, 3+3=6), R lateral mid (10%, 3+4=7), Vol 20.8 cc May 2016: 12 core biopsy - 5/12 cores: L lateral apex (30%, 3+4=7), L apex (80%, 3+4=7), L mid (50%, 3+4=7), R lateral apex (30%, 3+4=7), R lateral mid (10%, 3+3=6), Vol 28.6 cc  Baseline urinary function: He has minimal voiding symptoms. IPSS is 3. Baseline erectile function: He does have significant erectile dysfunction. SHIM score is 10. He is married.  Interval history:  Mr. Beazer is seen today with his wife to further discuss options for management of his prostate cancer following his most recent biopsy.  We have discussed the change in his prostate cancer situation over the past year including his PSA increase, slight change in his digital rectal exam, and the progression noted on his recent prostate biopsy including an increased volume of disease overall upper take really an increased volume of higher risk lease and 3+4 = 7 disease.  Overall, he remains completely asymptomatic.  He has very minimal difficulty from a urinary standpoint.  He does have stable erectile dysfunction which is not a major concern to him.     Past Medical History  1. History of Asthma (J45.909)  2. History of diabetes mellitus (Z86.39)  3. History of glaucoma (Z86.69)  4. History of hypotension (Z86.79)  5. History of Peptic Ulcer  Surgical History  1.  History of Biopsy Skin  2. History of Hand Repair  3. History of Hand Repair  Current Meds  1. Acetaminophen 500 MG Oral Capsule;  Therapy: (Recorded:25Oct2013) to Recorded  2. Atorvastatin Calcium 20 MG Oral Tablet;  Therapy: 26Jun2013 to Recorded  3. Carvedilol 25 MG Oral Tablet;  Therapy: [id number] to Recorded  4. Diltiazem HCl ER Beads 180 MG Oral Capsule Extended Release 24 Hour;  Therapy: [id number] to Recorded  5. Dorzolamide HCl - 2 % Ophthalmic Solution;  Therapy:  (Recorded:06Feb2015) to Recorded  6. Fexofenadine HCl - 180 MG Oral Tablet;  Therapy: (Recorded:06Feb2015) to Recorded  7. Fish Oil Concentrate 1000 MG Oral Capsule;  Therapy: (IPJASNKN:39JQB3419) to Recorded  8. Fluticasone Propionate 50 MCG/ACT Nasal Suspension;  Therapy: 37TKW4097 to Recorded  9. Glucosamine Sulfate 1000 MG Oral Capsule;  Therapy: (DZHGDJME:26STM1962) to Recorded  10. Latanoprost 0.005 % Ophthalmic Solution;   Therapy: 22LNL8921 to Recorded  11. Levothyroxine Sodium 50 MCG Oral Tablet;   Therapy: 19ERD4081 to Recorded  12. Methocarbamol 750 MG Oral Tablet;   Therapy: 22Nov2013 to Recorded  13. Multi-Vitamin TABS;   Therapy: (KGYJEHUD:14HFW2637) to Recorded  14. Niacin TABS;   Therapy: (CHYIFOYD:74JOI7867) to Recorded  15. Omeprazole 20 MG Oral Tablet Delayed Release;   Therapy: (EHMCNOBS:96GEZ6629) to Recorded  16. Pioglitazone HCl - 15 MG Oral Tablet;   Therapy: (Recorded:06Jan2016) to Recorded  17. Quinapril HCl - 20 MG Oral Tablet;   Therapy: 47MLY6503 to Recorded  18. Vitamin D 2000 UNIT Oral Capsule;   Therapy: (Recorded:25Oct2013) to Recorded  Allergies  1. Augmentin TABS  2. Sulfa Drugs  3. Aspirin TABS  4. NSAIDs  Family History  1. Family history of Diabetes Mellitus  2. Family history of Heart Disease : Mother  3. Family history of Hyperlipidemia  4. Family history of Hypertension  5. Family history of Prostate Cancer : Father  36. Family history of Viral Pneumonia : Father  Social History   Alcohol Use   Marital History - Divorced   Never A Smoker   Occupation:  Review of Systems AU Complete-Male: Genitourinary, constitutional, skin, eye, otolaryngeal, hematologic/lymphatic, cardiovascular, pulmonary, endocrine, musculoskeletal, gastrointestinal, neurological and psychiatric system(s) were reviewed and pertinent findings if present are noted and are otherwise negative.    Vitals  Weight 227.6 pounds, height 5 foot 9 inches,  temperature 98.0, heart rate 60, respirations 18, blood pressure 126/50   Physical Exam Constitutional: Well nourished and well developed . No acute distress.    Results/Data  We have reviewed his medical records, PSA results, pathology reports, and imaging reports.  We independently reviewed his pathology slides from his previous multiple biopsies as well as all of his imaging studies in the multidisciplinary conference today.  Findings are as dictated above.     Assessment  1. Prostate cancer (C61)  Discussion/Summary  1.  Prostate cancer: I have had a long and detailed discussion with Mr. Vanpatten and his wife today.  We have discussed the factors that demonstrate progression of his prostate cancer compared to his prior biopsies over the past 3 years.  He understands that he still has a very curative prostate cancer based on the disease parameters at this time.  However, he understands that further surveillance does risk possible development of metastatic and symptomatic disease.  I have recommended that he come to a decision as to whether he still wants to consider curative therapy versus ongoing surveillance management with the idea of going treatment and systemic, non-curative therapy in the future should  he progress to the development of metastatic disease.  After a very detailed discussion regarding the pros and cons of therapy, he is most interested in proceeding with treatment.  He is not interested in proceeding with surgical therapy which I am in agreement with considering his advanced age.  He does express interest in proceeding with external beam radiation therapy and has discussed this option in depth with Dr. Tammi Klippel earlier this morning.  We have reviewed the potential risks including urinary, bowel, and sexual side effects that can occur.  He understands these potential risks but does feel that the benefit would be worthwhile considering his disease progression and desired to avoid the  development of symptomatic metastatic disease.  We also discussed the option of short course androgen deprivation therapy and reviewed some of the recent data suggesting potential benefit.  We also discussed the potential side effects of this therapy.  He does not wish to include androgen deprivation therapy with his treatment.  At this time, he feels very comfortable proceeding with external beam radiation therapy.  He would like to delay treatment until the fall which is likely reasonable.  Our current plan is to schedule him for a repeat baseline PSA prior to treatment and for fiducial marker placement sometime in early September.  He would like to proceed with radiation therapy beginning late September or early October.  Cc: Dr. Garret Reddish Dr. Zola Button Dr. Tyler Pita   A total of 35 minutes were spent in the overall care of the patient today with 35 minutes in direct face to face consultation.    Signatures Electronically signed by : Raynelle Bring, M.D.; Aug 15 2014 11:56AM EST

## 2014-08-15 NOTE — Progress Notes (Signed)
Radiation Oncology         [phone] ________________________________  Multidisciplinary Prostate Cancer Clinic  Initial Radiation Oncology Consultation  Name: Eddie Ferguson. Trageser MRN: [id number]  Date: 08/15/2014  DOB: [birth year]  HY:[id number], Annie Main, MD  Raynelle Bring, MD   REFERRING PHYSICIAN: Raynelle Bring, MD  DIAGNOSIS: 74 y.o. gentleman with stage T2a adenocarcinoma of the prostate with a Gleason's score of 3+4 and a PSA of 9.19    ICD-9-CM ICD-10-CM   1. Prostate cancer 185 C61     HISTORY OF PRESENT ILLNESS::Eddie Ferguson is a 74 y.o. gentleman.  His was PSA 5.9 in May 2013 with a nodular exam.  His volume was 25 cc.  He was biopsied by Dr. Jeffie Pollock and found found to have Gleason 3+3 on left and 3+4 on right.  We was seen in [place] with Drs. Wilson and Moose at that time and based QOL, decided active surveillance  He had prostate MRI on 01/13/12  He underwent Repeat biopsy 02/17/12 - 26 core saturation biopsy    Elected to continue surveillance.    Standard 12 core biopsy 07/31/13     Elected to continue surveillance.  PSA was 8.75 on 03/10/14 and 9.19 on 06/12/14     His PSA is now slightly higher at 9.19 and now and he is developing nodularity in right apex according to DRE with Dr. Alinda Money.   He had repeat TRUS and biopsy on 07/31/14.      Repeat biopsy on 07/31/14      The patient reviewed the biopsy results with his urologist and he has kindly been referred today to the multidisciplinary prostate cancer clinic for presentation of pathology and radiology studies in our conference for discussion of potential radiation treatment options and clinical evaluation.  PREVIOUS RADIATION THERAPY: No  PAST MEDICAL HISTORY:  has a past medical history of Diabetes mellitus; Hypertension; Hyperlipidemia; Hypothyroidism; Seasonal allergies; Glaucoma; Arthritis; Blood transfusion; Stomach ulcer (2010); Basal cell carcinoma (05/28/2014); Sinusitis; and Prostate  cancer.    PAST SURGICAL HISTORY: Past Surgical History  Procedure Laterality Date  . Lumbar laminectomy  2007    [place]  . Reconstruction tendon pulley w/ tendon / fascial graft of hand / finger  1982    rt 3rd finger  . Reconstruction tendon pulley w/ tendon / fascial graft of hand / finger  1953    rt 5th finger  . Colonoscopy      2012, 5 year repeat  . Esophagogastroduodenoscopy  2010    [facility]    FAMILY HISTORY: family history includes Brain cancer in his brother; Diabetes in his father and mother; Hyperlipidemia in his father. There is no history of Colon cancer, Esophageal cancer, or Stomach cancer.  SOCIAL HISTORY:  reports that he quit smoking about 13 years ago. He has never used smokeless tobacco. He reports that he drinks about 4.2 oz of alcohol per week. He reports that he does not use illicit drugs.  ALLERGIES: Aspirin; Nsaids; Amoxicillin-pot clavulanate; and Sulfa antibiotics  MEDICATIONS:  Current Outpatient Prescriptions  Medication Sig Dispense Refill  . acetaminophen (TYLENOL) 500 MG tablet Take 500 mg by mouth every 6 (six) hours as needed.      . carvedilol (COREG) 25 MG tablet Take 0.5 tablets (12.5 mg total) by mouth 2 (two) times daily with a meal. 180 tablet 3  . Cholecalciferol (VITAMIN D) 2000 UNITS CAPS Take by mouth daily.      . dorzolamide (TRUSOPT) 2 % ophthalmic solution   2  .  fexofenadine (ALLEGRA) 180 MG tablet Take 180 mg by mouth.    . fluticasone (FLONASE) 50 MCG/ACT nasal spray Place 2 sprays into both nostrils daily. 48 g 3  . Glucosamine-Chondroit-Vit C-Mn (GLUCOSAMINE-CHONDROITIN) CAPS Take 1,500 mg by mouth.    . latanoprost (XALATAN) 0.005 % ophthalmic solution Place 1 drop into both eyes at bedtime.     Marland Kitchen levothyroxine (SYNTHROID) 50 MCG tablet Take 1 tablet (50 mcg total) by mouth daily. 90 tablet 3  . methocarbamol (ROBAXIN) 750 MG tablet     . Multiple Vitamin (MULTIVITAMIN) tablet Take 1 tablet by mouth daily.      . niacin 500 MG  tablet Take 500 mg by mouth daily with breakfast.      . Omega-3 Fatty Acids (FISH OIL) 1200 MG CAPS Take by mouth daily.      Marland Kitchen omeprazole (PRILOSEC) 20 MG capsule Take 20 mg by mouth daily.      . pioglitazone (ACTOS) 30 MG tablet Take 1 tablet (30 mg total) by mouth daily. 90 tablet 3  . pravastatin (PRAVACHOL) 40 MG tablet Take 1 tablet (40 mg total) by mouth daily. 90 tablet 3  . quinapril (ACCUPRIL) 20 MG tablet Take 1 tablet (20 mg total) by mouth daily. 90 tablet 3  . Timolol Maleate 0.5 % (DAILY) SOLN     . diltiazem (CARDIZEM CD) 180 MG 24 hr capsule Take 1 capsule (180 mg total) by mouth 2 (two) times daily. (Patient not taking: Reported on 08/15/2014) 180 capsule 3   No current facility-administered medications for this encounter.    REVIEW OF SYSTEMS:  A 15 point review of systems is documented in the electronic medical record. This was obtained by the nursing staff. However, I reviewed this with the patient to discuss relevant findings and make appropriate changes.  A comprehensive review of systems was negative..  The patient completed an IPSS and IIEF questionnaire.  His IPSS score was 3 indicating mild urinary outflow obstructive symptoms.  He indicated that his erectile function is almost never able to complete sexual activity .   PHYSICAL EXAM: This patient is in no acute distress.  He is alert and oriented.   height is 5\' 9"  (1.753 m) and weight is 227 lb 9.6 oz (103.239 kg). His oral temperature is 98 F (36.7 C). His blood pressure is 126/50 and his pulse is 60. His respiration is 18 and oxygen saturation is 100%.  He exhibits no respiratory distress or labored breathing.  He appears neurologically intact.  His mood is pleasant.  His affect is appropriate.  Please note the digital rectal exam findings described above.  KPS = 100  100 - Normal; no complaints; no evidence of disease. 90   - Able to carry on normal activity; minor signs or symptoms of disease. 80   - Normal  activity with effort; some signs or symptoms of disease. 46   - Cares for self; unable to carry on normal activity or to do active work. 60   - Requires occasional assistance, but is able to care for most of his personal needs. 50   - Requires considerable assistance and frequent medical care. 26   - Disabled; requires special care and assistance. 58   - Severely disabled; hospital admission is indicated although death not imminent. 53   - Very sick; hospital admission necessary; active supportive treatment necessary. 10   - Moribund; fatal processes progressing rapidly. 0     - Dead  Karnofsky DA, Abelmann  [place], Craver LS and Burchenal JH [phone]) The use of the nitrogen mustards in the palliative treatment of carcinoma: with particular reference to bronchogenic carcinoma Cancer 1 634-56   LABORATORY DATA:  Lab Results  Component Value Date   WBC 10.2 04/23/2014   HGB 13.4 04/23/2014   HCT 39.9 04/23/2014   MCV 85.6 04/23/2014   PLT 228.0 04/23/2014   Lab Results  Component Value Date   NA 144 04/23/2014   K 3.8 04/23/2014   CL 105 04/23/2014   CO2 30 04/23/2014   Lab Results  Component Value Date   ALT 16 04/23/2014   AST 16 04/23/2014   ALKPHOS 62 04/23/2014   BILITOT 0.4 04/23/2014     RADIOGRAPHY: No results found.    IMPRESSION: This gentleman is a 74 y.o. 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  His T-Stage, Gleason's Score, and PSA put him into the intermediate risk group.  Accordingly he is eligible for a variety of potential treatment options including external beam radiation.  PLAN: Today I reviewed the findings and workup thus far.  We discussed the natural history of prostate cancer.  We reviewed the the implications of T-stage, Gleason's Score, and PSA on decision-making and outcomes in prostate cancer.  We discussed radiation treatment in the management of prostate cancer with regard to the logistics  and delivery of external beam radiation treatment as well as the logistics and delivery of prostate brachytherapy.  We compared and contrasted each of these approaches and also compared these against prostatectomy.  The patient expressed some interest in external beam radiotherapy, but was concerned over quality of life. Will talk further with Dr. Alinda Money  I filled out a patient counseling form for him with relevant treatment diagrams and we retained a copy for our records.   If the patient would like to proceed with prostate IMRT,  I will share my findings with Dr. Alinda Money and move forward with scheduling placement of three gold fiducial markers into the prostate to proceed with IMRT in the near future.     I enjoyed meeting with him today, and will look forward to participating in the care of this very nice gentleman.   I spent 40 minutes face to face with the patient and more than 50% of that time was spent in counseling and/or coordination of care.   This document serves as a record of services personally performed by Tyler Pita, MD. It was created on his behalf by Arlyce Harman, a trained medical scribe. The creation of this record is based on the scribe's personal observations and the provider's statements to them. This document has been checked and approved by the attending provider.     ------------------------------------------------  Sheral Apley Tammi Klippel, M.D.

## 2014-08-15 NOTE — Progress Notes (Signed)
Works part time as a Education officer, community for [employer] (approximately 18 hours/week). Married to [employer]. Two children, [name] of [place]. Had a colonscopy on 02/18/11. Reports that he does perform routine testicular exams. Wears glasses. Reports swelling in his feet. Reports that he bruises easily. Reports hx of arthritis.

## 2014-08-15 NOTE — Progress Notes (Signed)
Oncology Nurse Navigator Documentation  Oncology Nurse Navigator Flowsheets 08/06/2014 08/14/2014 08/15/2014  Referral date to RadOnc/MedOnc 08/06/2014 - -  Navigator Encounter Type Introductory phone call Telephone Clinic/MDC- Met Mr. Persky and his wife today in the Prostate [place]. He states he found the clinic to be very informative and helped him make a decision to seek treatment. He would like to proceed with radiation this fall but he does not want to the Androgen Deprivation Therapy. He is interested in quality of life. All questions were addressed and I asked him to call me with any questions or concerns. He voiced understanding.  Patient Visit Type - - Initial  Support Groups/Services - - Prostate- Calender with monthly support groups was given to the patient. I discussed the Prostate support group and invited him and his wife to come.  Time Spent with Patient 15 - 30      Care Plan Summary  Name: Mr. Deaaron Fulghum DOB:08/25/1940  Your Medical Team: Urologist - Dr. Raynelle Bring, [facility] Radiation Oncologist - Dr.Matthew Micki [facility]  Medical Oncologist - Dr. Zola Button, [place]  Recommendations: 1) Androgen Deprivation Therapy ( Hormone Therapy) 2) Radiation  * These recommendations are based on information available as of today's consult. Recommendations may change depending on the results of further tests or exams.  Next Steps: 1) Dr. Lynne Logan office will schedule you for gold markers 2) PSA redrawn early fall with Dr. Alinda Money 3) Dr. Johny Shears office will schedule radiation  When appointments need to be scheduled, you will be contacted by [facility] and/or [facility].  Questions? Please do not hesitate to call Cira Rue, RN, BSN, CRNI at [phone] any questions or concerns. Shirlean Mylar is your Oncology Nurse  Navigator and is available to assist you while you're receiving your medical care at [facility].

## 2014-08-20 NOTE — Progress Notes (Signed)
[place] Psychosocial Distress Screening Spiritual Care  Spiritual Care was referred by distress screening protocol.  The patient scored a 0 on the Psychosocial Distress Thermometer which indicates mild distress. Nurse Navigator Cira Rue, RN addressed distress and other psychosocial needs in [facility].   ONCBCN DISTRESS SCREENING 08/20/2014  Screening Type Initial Screening  Distress experienced in past week (1-10) 0  Referral to support programs Yes   Per pt, he has no distress.  He is prepared to begin radiation this fall.    Follow up needed: No.  Eddie Ferguson and his wife have packet of [facility] resources and team contact information, should needs change.  Please also page as needs arise.  Thank you.   [place], [place]

## 2014-08-25 ENCOUNTER — Encounter: Payer: Self-pay | Admitting: Family Medicine

## 2014-08-25 ENCOUNTER — Ambulatory Visit (INDEPENDENT_AMBULATORY_CARE_PROVIDER_SITE_OTHER): Payer: Medicare Other | Admitting: Family Medicine

## 2014-08-25 VITALS — BP 126/70 | HR 52 | Temp 98.7°F | Wt 231.0 lb

## 2014-08-25 DIAGNOSIS — E038 Other specified hypothyroidism: Secondary | ICD-10-CM

## 2014-08-25 DIAGNOSIS — E034 Atrophy of thyroid (acquired): Secondary | ICD-10-CM | POA: Diagnosis not present

## 2014-08-25 DIAGNOSIS — Z23 Encounter for immunization: Secondary | ICD-10-CM

## 2014-08-25 DIAGNOSIS — I10 Essential (primary) hypertension: Secondary | ICD-10-CM | POA: Diagnosis not present

## 2014-08-25 DIAGNOSIS — E119 Type 2 diabetes mellitus without complications: Secondary | ICD-10-CM | POA: Diagnosis not present

## 2014-08-25 DIAGNOSIS — C61 Malignant neoplasm of prostate: Secondary | ICD-10-CM

## 2014-08-25 DIAGNOSIS — E785 Hyperlipidemia, unspecified: Secondary | ICD-10-CM

## 2014-08-25 LAB — COMPREHENSIVE METABOLIC PANEL
ALK PHOS: 56 U/L (ref 39–117)
ALT: 15 U/L (ref 0–53)
AST: 17 U/L (ref 0–37)
Albumin: 4.1 g/dL (ref 3.5–5.2)
BILIRUBIN TOTAL: 0.5 mg/dL (ref 0.2–1.2)
BUN: 17 mg/dL (ref 6–23)
CO2: 32 mEq/L (ref 19–32)
CREATININE: 1.3 mg/dL (ref 0.40–1.50)
Calcium: 9.1 mg/dL (ref 8.4–10.5)
Chloride: 101 mEq/L (ref 96–112)
GFR: 57.01 mL/min — AB (ref >60.00)
Glucose, Bld: 113 mg/dL — ABNORMAL HIGH (ref 70–99)
Potassium: 3.9 mEq/L (ref 3.5–5.1)
Sodium: 138 mEq/L (ref 135–145)
Total Protein: 6.3 g/dL (ref 6.0–8.3)

## 2014-08-25 LAB — LIPID PANEL
CHOLESTEROL: 147 mg/dL (ref 0–200)
HDL: 52.6 mg/dL (ref >39.00)
LDL Cholesterol: 76 mg/dL (ref 0–99)
NonHDL: 94.4
Total CHOL/HDL Ratio: 3
Triglycerides: 92 mg/dL (ref 0.0–149.0)
VLDL: 18.4 mg/dL (ref 0.0–40.0)

## 2014-08-25 LAB — TSH: TSH: 2.45 u[IU]/mL (ref 0.35–4.50)

## 2014-08-25 LAB — HEMOGLOBIN A1C: HEMOGLOBIN A1C: 6.3 % (ref 4.6–6.5)

## 2014-08-25 MED ORDER — METHOCARBAMOL 750 MG PO TABS: 750.0000 mg | ORAL_TABLET | Freq: Three times a day (TID) | ORAL | Status: DC | PRN

## 2014-08-25 NOTE — Assessment & Plan Note (Signed)
S: controlled on synthroid 42mcg Lab Results  Component Value Date   TSH 2.45 08/25/2014  A/P: continue current rx.

## 2014-08-25 NOTE — Assessment & Plan Note (Signed)
S: controlled, compliant with actos alone A/P: excellent control, continue actos 30mg 

## 2014-08-25 NOTE — Assessment & Plan Note (Signed)
Active surveillance has shown a more aggressive tumor burden and patient will be getting external beam therapy in august

## 2014-08-25 NOTE — Patient Instructions (Addendum)
Received [id number] today.  Update labs today fasting  Follow up in 6 months  No changes  You will be in my thoughts in prayers in regards to your upcoming treatment for prostate cancer

## 2014-08-25 NOTE — Progress Notes (Signed)
Garret Reddish, MD  Subjective:  Eddie Ferguson is a 74 y.o. year old very pleasant male patient who presents with:  See problem oriented charting- ROS- no hypoglycemia or blurry vision. No chest pain or shortness of breath. No hair or nail changes. No heat/cold intolerance. No constipation or diarrhea. Denies shakiness or anxiety.   Past Medical History- prostate cancer, HLD, stomach ulcer in past, GERD  Medications- reviewed and updated Current Outpatient Prescriptions  Medication Sig Dispense Refill  . carvedilol (COREG) 25 MG tablet Take 0.5 tablets (12.5 mg total) by mouth 2 (two) times daily with a meal. 180 tablet 3  . Cholecalciferol (VITAMIN D) 2000 UNITS CAPS Take by mouth daily.      . fexofenadine (ALLEGRA) 180 MG tablet Take 180 mg by mouth.    . fluticasone (FLONASE) 50 MCG/ACT nasal spray Place 2 sprays into both nostrils daily. 48 g 3  . Glucosamine-Chondroit-Vit C-Mn (GLUCOSAMINE-CHONDROITIN) CAPS Take 1,500 mg by mouth.    . latanoprost (XALATAN) 0.005 % ophthalmic solution Place 1 drop into both eyes at bedtime.     Marland Kitchen levothyroxine (SYNTHROID) 50 MCG tablet Take 1 tablet (50 mcg total) by mouth daily. 90 tablet 3  . methocarbamol (ROBAXIN) 750 MG tablet     . Multiple Vitamin (MULTIVITAMIN) tablet Take 1 tablet by mouth daily.      . Omega-3 Fatty Acids (FISH OIL) 1200 MG CAPS Take by mouth daily.      Marland Kitchen omeprazole (PRILOSEC) 20 MG capsule Take 20 mg by mouth daily.      . pioglitazone (ACTOS) 30 MG tablet Take 1 tablet (30 mg total) by mouth daily. 90 tablet 3  . pravastatin (PRAVACHOL) 40 MG tablet Take 1 tablet (40 mg total) by mouth daily. 90 tablet 3  . quinapril (ACCUPRIL) 20 MG tablet Take 1 tablet (20 mg total) by mouth daily. 90 tablet 3  . Timolol Maleate 0.5 % (DAILY) SOLN     . acetaminophen (TYLENOL) 500 MG tablet Take 500 mg by mouth every 6 (six) hours as needed.      . dorzolamide (TRUSOPT) 2 % ophthalmic solution   2  . niacin 500 MG tablet Take  500 mg by mouth daily with breakfast.       No current facility-administered medications for this visit.    Objective: BP 126/70 mmHg  Pulse 52  Temp(Src) 98.7 F (37.1 C)  Wt 231 lb (104.781 kg) Gen: NAD, resting comfortably No thyromegaly CV: RRR no murmurs rubs or gallops Lungs: CTAB no crackles, wheeze, rhonchi Abdomen: soft/nontender/nondistended/normal bowel sounds.  Ext: 1+ edema Skin: warm, dry Neuro: grossly normal, moves all extremities   Assessment/Plan:   Diabetes mellitus type 2, controlled S: controlled, compliant with actos alone A/P: excellent control, continue actos 30mg    Essential hypertension S: controlled. Occasionally dizzy with standing quickly but usually not A/P: continue current rx: carvedilol 12.5mg  BID, diltiazem 180mg  XL, quinapril 20mg  Watch HR- consider decreasing carvedilol if HR consistently below 55 but has only been intermittent   Hypothyroidism S: controlled on synthroid 100mcg Lab Results  Component Value Date   TSH 2.45 08/25/2014  A/P: continue current rx.    Prostate cancer Active surveillance has shown a more aggressive tumor burden and patient will be getting external beam therapy in august  6 months  Orders Placed This Encounter  Procedures  . Pneumococcal conjugate vaccine 13-valent  . Comprehensive metabolic panel    [place]    Order Specific Question:  Has the patient  fasted?    Answer:  No  . Lipid panel    [place]    Order Specific Question:  Has the patient fasted?    Answer:  No  . Hemoglobin A1c    [place]  . TSH    [place]    Meds ordered this encounter  Medications  . methocarbamol (ROBAXIN) 750 MG tablet    Sig: Take 1 tablet (750 mg total) by mouth every 8 (eight) hours as needed for muscle spasms.    Dispense:  60 tablet    Refill:  3

## 2014-08-25 NOTE — Assessment & Plan Note (Addendum)
S: controlled. Occasionally dizzy with standing quickly but usually not A/P: continue current rx: carvedilol 12.5mg  BID, diltiazem 180mg  XL, quinapril 20mg  Watch HR- consider decreasing carvedilol if HR consistently below 55 but has only been intermittent

## 2014-09-28 ENCOUNTER — Encounter (HOSPITAL_COMMUNITY): Payer: Self-pay | Admitting: *Deleted

## 2014-09-28 ENCOUNTER — Emergency Department (INDEPENDENT_AMBULATORY_CARE_PROVIDER_SITE_OTHER)
Admission: EM | Admit: 2014-09-28 | Discharge: 2014-09-28 | Disposition: A | Discharge status: Home / Self Care | Payer: Medicare Other | Source: Home / Self Care | Attending: Emergency Medicine | Admitting: Emergency Medicine

## 2014-09-28 ENCOUNTER — Emergency Department (INDEPENDENT_AMBULATORY_CARE_PROVIDER_SITE_OTHER): Payer: Medicare Other

## 2014-09-28 DIAGNOSIS — R059 Cough, unspecified: Secondary | ICD-10-CM

## 2014-09-28 DIAGNOSIS — R05 Cough: Secondary | ICD-10-CM

## 2014-09-28 MED ORDER — LEVOFLOXACIN 500 MG PO TABS: 500.0000 mg | ORAL_TABLET | Freq: Every day | ORAL | Status: DC

## 2014-09-28 MED ORDER — PREDNISONE 50 MG PO TABS: ORAL_TABLET | ORAL | Status: DC

## 2014-09-28 MED ORDER — HYDROCODONE-HOMATROPINE 5-1.5 MG/5ML PO SYRP: 5.0000 mL | ORAL_SOLUTION | Freq: Four times a day (QID) | ORAL | Status: DC | PRN

## 2014-09-28 NOTE — ED Provider Notes (Signed)
CSN: [id number]     Arrival date & time 09/28/14  1434 History   First MD Initiated Contact with Patient 09/28/14 1637     Chief Complaint  Patient presents with  . Cough   (Consider location/radiation/quality/duration/timing/severity/associated sxs/prior Treatment) HPI  Eddie is a 74 year old Ferguson here for evaluation of cough. Eddie states Eddie has been coughing for 12 days. The cough is paroxysmal. It is intermittently productive. Eddie reports shortness of breath with cough only. Eddie reports intermittent fevers at home. Eddie has had some nasal congestion and rhinorrhea. No sore throat. No chest pain. Eddie has tried Robitussin and several other over-the-counter cough medicines without much improvement. No history of asthma or COPD. Eddie is a nonsmoker.  Past Medical History  Diagnosis Date  . Diabetes mellitus   . Hypertension   . Hyperlipidemia   . Hypothyroidism   . Seasonal allergies   . Glaucoma   . Arthritis   . Blood transfusion     2010 because of stomach ulcers  . Stomach ulcer 2010    bleeding ulcer result NSAIDS  . Basal cell carcinoma 05/28/2014    L nasal tip. Mohs Dr. Link Snuffer.    . Sinusitis     treated for bacterial infection at least once a year at [facility]  . Prostate cancer    Past Surgical History  Procedure Laterality Date  . Lumbar laminectomy  2007    [place]  . Reconstruction tendon pulley w/ tendon / fascial graft of hand / finger  1982    rt 3rd finger  . Reconstruction tendon pulley w/ tendon / fascial graft of hand / finger  1953    rt 5th finger  . Colonoscopy      2012, 5 year repeat  . Esophagogastroduodenoscopy  2010    PUD  . Back surgery     Family History  Problem Relation Age of Onset  . Colon cancer Neg Hx   . Esophageal cancer Neg Hx   . Stomach cancer Neg Hx   . Diabetes Mother   . Diabetes Father   . Hyperlipidemia Father   . Brain cancer Brother     smoker   History  Substance Use Topics  . Smoking status: Former Smoker    Quit date:  02/03/2001  . Smokeless tobacco: Never Used     Comment: occasoinal cigars previously  . Alcohol Use: Yes     Comment: occasional    Review of Systems As in history of present illness Allergies  Aspirin; Nsaids; Amoxicillin-pot clavulanate; and Sulfa antibiotics  Home Medications   Prior to Admission medications   Medication Sig Start Date End Date Taking? Authorizing Provider  acetaminophen (TYLENOL) 500 MG tablet Take 500 mg by mouth every 6 (six) hours as needed.     Yes Historical Provider, MD  carvedilol (COREG) 25 MG tablet Take 0.5 tablets (12.5 mg total) by mouth 2 (two) times daily with a meal. 05/02/14  Yes Timmothy Euler, MD  Cholecalciferol (VITAMIN D) 2000 UNITS CAPS Take by mouth daily.     Yes Historical Provider, MD  diltiazem (CARTIA XT) 180 MG 24 hr capsule Take 180 mg by mouth 2 (two) times daily.   Yes Historical Provider, MD  dorzolamide (TRUSOPT) 2 % ophthalmic solution  06/21/14  Yes Historical Provider, MD  fexofenadine (ALLEGRA) 180 MG tablet Take 180 mg by mouth.   Yes Historical Provider, MD  fluticasone (FLONASE) 50 MCG/ACT nasal spray Place 2 sprays into both nostrils daily. 05/08/14  Yes Marin Olp, MD  Glucosamine-Chondroit-Vit C-Mn (GLUCOSAMINE-CHONDROITIN) CAPS Take 1,500 mg by mouth.   Yes Historical Provider, MD  latanoprost (XALATAN) 0.005 % ophthalmic solution Place 1 drop into both eyes at bedtime.  01/21/11  Yes Historical Provider, MD  levothyroxine (SYNTHROID) 50 MCG tablet Take 1 tablet (50 mcg total) by mouth daily. 04/23/14  Yes Marin Olp, MD  methocarbamol (ROBAXIN) 750 MG tablet Take 1 tablet (750 mg total) by mouth every 8 (eight) hours as needed for muscle spasms. 08/25/14  Yes Marin Olp, MD  Multiple Vitamin (MULTIVITAMIN) tablet Take 1 tablet by mouth daily.     Yes Historical Provider, MD  Omega-3 Fatty Acids (FISH OIL) 1200 MG CAPS Take by mouth daily.     Yes Historical Provider, MD  omeprazole (PRILOSEC) 20 MG capsule  Take 20 mg by mouth 2 (two) times daily before a meal.    Yes Historical Provider, MD  pioglitazone (ACTOS) 30 MG tablet Take 1 tablet (30 mg total) by mouth daily. 04/23/14  Yes Marin Olp, MD  pravastatin (PRAVACHOL) 40 MG tablet Take 1 tablet (40 mg total) by mouth daily. 04/23/14  Yes Marin Olp, MD  quinapril (ACCUPRIL) 20 MG tablet Take 1 tablet (20 mg total) by mouth daily. 04/23/14  Yes Marin Olp, MD  Timolol Maleate 0.5 % (DAILY) SOLN  10/05/12  Yes Historical Provider, MD  HYDROcodone-homatropine (HYCODAN) 5-1.5 MG/5ML syrup Take 5 mLs by mouth every 6 (six) hours as needed for cough. 09/28/14   Melony Overly, MD  levofloxacin (LEVAQUIN) 500 MG tablet Take 1 tablet (500 mg total) by mouth daily. 09/28/14   Melony Overly, MD  niacin 500 MG tablet Take 500 mg by mouth daily with breakfast.      Historical Provider, MD  predniSONE (DELTASONE) 50 MG tablet Take 1 pill daily for 5 days. 09/28/14   Melony Overly, MD   BP 179/87 mmHg  Pulse 65  Temp(Src) 98.9 F (37.2 C) (Oral)  Resp 24  SpO2 94% Physical Exam  Constitutional: Eddie is oriented to person, place, and time. Eddie appears well-developed and well-nourished. No distress.  HENT:  Mouth/Throat: Oropharynx is clear and moist. No oropharyngeal exudate.  Neck: Neck supple.  Cardiovascular: Normal rate, regular rhythm and normal heart sounds.   No murmur heard. Pulmonary/Chest: Effort normal. No respiratory distress. Eddie has no wheezes. Eddie has rales (faint in left lung base).  Lymphadenopathy:    Eddie has no cervical adenopathy.  Neurological: Eddie is alert and oriented to person, place, and time.    ED Course  Procedures (including critical care time) Labs Review Labs Reviewed - No data to display  Imaging Review Dg Chest 2 View  09/28/2014   CLINICAL DATA:  Coughing congestion for 12 days.  Fever.  EXAM: CHEST  2 VIEW  COMPARISON:  02/13/2014  FINDINGS: The patient is slightly rotated to the right. Cardiomediastinal  silhouette does not appear significantly changed. There is mild left basilar opacity, stable to slightly decreased compared to the prior study. A small calcified granuloma in the lateral left lung base is unchanged. The right lung is clear. Elevation of the right hemidiaphragm is again seen. No acute osseous abnormality is identified.  IMPRESSION: Mild left basilar atelectasis.   Electronically Signed   By: Logan Bores   On: 09/28/2014 16:54     MDM   1. Cough    X-ray shows left basilar atelectasis. Given his cough, reported fevers, and  O2 sat of 94%, will treat presumptively for pneumonia with Levaquin and prednisone. Hycodan for cough. Follow-up with PCP as needed.    Melony Overly, MD 09/28/14 [phone]

## 2014-09-28 NOTE — ED Notes (Signed)
C/O intermittent cough since 7/12, occasionally productive.  Cough waxes and wanes, but will not resolve.  Has felt feverish at times, most recently last night.  Denies SOB.  C/O severe coughing fits "where it's hard to catch my breath".

## 2014-09-28 NOTE — Discharge Instructions (Signed)
I am concerned that you have pneumonia. Take Levaquin daily for 7 days. Take prednisone daily for 5 days. Use Hycodan cough syrup at night. Follow-up as needed.

## 2014-11-25 ENCOUNTER — Telehealth: Payer: Self-pay | Admitting: *Deleted

## 2014-11-25 NOTE — Telephone Encounter (Signed)
PATIENT'S SEEDS BEING PLACED ON 11-25-14 @ [facility] AND HIS SIM ON 12-05-14 @ 10 AM @ DR. MANNING'S OFFICE(REASON FOR SIM ON 12-05-14), SIM COMPLETELY BOOKED ON 11-28-14, SPOKE WITH PATIENT AND HE IS AWARE OF THESE APPTS.

## 2014-12-05 ENCOUNTER — Ambulatory Visit
Admission: RE | Admit: 2014-12-05 | Discharge: 2014-12-05 | Disposition: A | Discharge status: Home / Self Care | Payer: Medicare Other | Source: Ambulatory Visit | Attending: Radiation Oncology | Admitting: Radiation Oncology

## 2014-12-05 DIAGNOSIS — Z882 Allergy status to sulfonamides status: Secondary | ICD-10-CM | POA: Insufficient documentation

## 2014-12-05 DIAGNOSIS — Z510 Encounter for antineoplastic radiation therapy: Secondary | ICD-10-CM | POA: Diagnosis not present

## 2014-12-05 DIAGNOSIS — C61 Malignant neoplasm of prostate: Secondary | ICD-10-CM | POA: Diagnosis present

## 2014-12-05 DIAGNOSIS — Z886 Allergy status to analgesic agent status: Secondary | ICD-10-CM | POA: Insufficient documentation

## 2014-12-05 DIAGNOSIS — Z881 Allergy status to other antibiotic agents status: Secondary | ICD-10-CM | POA: Insufficient documentation

## 2014-12-05 NOTE — Progress Notes (Signed)
  Radiation Oncology         [phone]) [phone] ________________________________  Name: Eddie Ferguson MRN: [id number]  Date: 12/05/2014  DOB: 1940/12/07  SIMULATION AND TREATMENT PLANNING NOTE    ICD-9-CM ICD-10-CM   1. Prostate cancer ([place]) 185 C61     DIAGNOSIS:  74 y.o. 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  Identity was confirmed.  All relevant records and images related to the planned course of therapy were reviewed.  The patient freely provided informed written consent to proceed with treatment after reviewing the details related to the planned course of therapy. The consent form was witnessed and verified by the simulation staff.  Then, the patient was set-up in a stable reproducible supine position for radiation therapy.  A vacuum lock pillow device was custom fabricated to position his legs in a reproducible immobilized position.  Then, I performed a urethrogram under sterile conditions to identify the prostatic apex.  CT images were obtained.  Surface markings were placed.  The CT images were loaded into the planning software.  Then the prostate target and avoidance structures including the rectum, bladder, bowel and hips were contoured.  Treatment planning then occurred.  The radiation prescription was entered and confirmed.  A total of one complex treatment devices were fabricated. I have requested : Intensity Modulated Radiotherapy (IMRT) is medically necessary for this case for the following reason:  Rectal sparing.Marland Kitchen  PLAN:  The patient will receive 78 Gy in 40 fractions.  This document serves as a record of services personally performed by Tyler Pita, MD. It was created on his behalf by Lenn Cal, a trained medical scribe. The creation of this record is based on the scribe's personal observations and the provider's statements to them.  This document has been checked and approved by the attending provider.  ________________________________  Eddie Ferguson, M.D.

## 2014-12-16 DIAGNOSIS — Z510 Encounter for antineoplastic radiation therapy: Secondary | ICD-10-CM | POA: Diagnosis not present

## 2014-12-17 ENCOUNTER — Ambulatory Visit
Admission: RE | Admit: 2014-12-17 | Discharge: 2014-12-17 | Disposition: A | Discharge status: Home / Self Care | Payer: Medicare Other | Source: Ambulatory Visit | Attending: Radiation Oncology | Admitting: Radiation Oncology

## 2014-12-17 DIAGNOSIS — Z510 Encounter for antineoplastic radiation therapy: Secondary | ICD-10-CM | POA: Diagnosis not present

## 2014-12-18 ENCOUNTER — Ambulatory Visit
Admission: RE | Admit: 2014-12-18 | Discharge: 2014-12-18 | Disposition: A | Discharge status: Home / Self Care | Payer: Medicare Other | Source: Ambulatory Visit | Attending: Radiation Oncology | Admitting: Radiation Oncology

## 2014-12-18 DIAGNOSIS — Z510 Encounter for antineoplastic radiation therapy: Secondary | ICD-10-CM | POA: Diagnosis not present

## 2014-12-19 ENCOUNTER — Ambulatory Visit
Admission: RE | Admit: 2014-12-19 | Discharge: 2014-12-19 | Disposition: A | Discharge status: Home / Self Care | Payer: Medicare Other | Source: Ambulatory Visit | Attending: Radiation Oncology | Admitting: Radiation Oncology

## 2014-12-19 ENCOUNTER — Encounter: Payer: Self-pay | Admitting: Radiation Oncology

## 2014-12-19 VITALS — BP 165/75 | HR 60 | Resp 16 | Wt 227.2 lb

## 2014-12-19 DIAGNOSIS — C61 Malignant neoplasm of prostate: Secondary | ICD-10-CM

## 2014-12-19 DIAGNOSIS — Z510 Encounter for antineoplastic radiation therapy: Secondary | ICD-10-CM | POA: Diagnosis not present

## 2014-12-19 NOTE — Progress Notes (Signed)
Department of Radiation Oncology  Phone:  [phone] Fax:        [phone]  Weekly Treatment Note    Name: Eddie Ferguson Date: 12/19/2014 MRN: [id number] DOB: 05-20-1940   Current dose: 5.85 Gy  Current fraction:3   MEDICATIONS: Current Outpatient Prescriptions  Medication Sig Dispense Refill  . methocarbamol (ROBAXIN) 750 MG tablet Take 750 mg by mouth.    Marland Kitchen acetaminophen (TYLENOL) 500 MG tablet Take 500 mg by mouth every 6 (six) hours as needed.      . carvedilol (COREG) 25 MG tablet Take 0.5 tablets (12.5 mg total) by mouth 2 (two) times daily with a meal. 180 tablet 3  . Cholecalciferol (VITAMIN D) 2000 UNITS CAPS Take by mouth daily.      Marland Kitchen diltiazem (CARTIA XT) 180 MG 24 hr capsule Take 180 mg by mouth 2 (two) times daily.    . dorzolamide (TRUSOPT) 2 % ophthalmic solution   2  . fexofenadine (ALLEGRA) 180 MG tablet Take 180 mg by mouth.    . fluticasone (FLONASE) 50 MCG/ACT nasal spray Place 2 sprays into both nostrils daily. 48 g 3  . Glucosamine-Chondroit-Vit C-Mn (GLUCOSAMINE-CHONDROITIN) CAPS Take 1,500 mg by mouth.    Marland Kitchen HYDROcodone-homatropine (HYCODAN) 5-1.5 MG/5ML syrup Take 5 mLs by mouth every 6 (six) hours as needed for cough. 120 mL 0  . latanoprost (XALATAN) 0.005 % ophthalmic solution Place 1 drop into both eyes at bedtime.     Marland Kitchen levothyroxine (SYNTHROID) 50 MCG tablet Take 1 tablet (50 mcg total) by mouth daily. 90 tablet 3  . methocarbamol (ROBAXIN) 750 MG tablet Take 1 tablet (750 mg total) by mouth every 8 (eight) hours as needed for muscle spasms. 60 tablet 3  . Multiple Vitamin (MULTIVITAMIN) tablet Take 1 tablet by mouth daily.      . niacin 500 MG tablet Take 500 mg by mouth daily with breakfast.      . Omega-3 Fatty Acids (FISH OIL) 1200 MG CAPS Take by mouth daily.      Marland Kitchen omeprazole (PRILOSEC) 20 MG capsule Take 20 mg by mouth 2 (two) times daily before a meal.     . pioglitazone (ACTOS) 30 MG tablet Take 1 tablet (30 mg total) by mouth  daily. 90 tablet 3  . pravastatin (PRAVACHOL) 40 MG tablet Take 1 tablet (40 mg total) by mouth daily. 90 tablet 3  . predniSONE (DELTASONE) 50 MG tablet Take 1 pill daily for 5 days. 5 tablet 0  . quinapril (ACCUPRIL) 20 MG tablet Take 1 tablet (20 mg total) by mouth daily. 90 tablet 3  . timolol (TIMOPTIC) 0.5 % ophthalmic solution   1  . Timolol Maleate 0.5 % (DAILY) SOLN      No current facility-administered medications for this encounter.     ALLERGIES: Aspirin; Nsaids; Amoxicillin-pot clavulanate; and Sulfa antibiotics   LABORATORY DATA:  Lab Results  Component Value Date   WBC 10.2 04/23/2014   HGB 13.4 04/23/2014   HCT 39.9 04/23/2014   MCV 85.6 04/23/2014   PLT 228.0 04/23/2014   Lab Results  Component Value Date   NA 138 08/25/2014   K 3.9 08/25/2014   CL 101 08/25/2014   CO2 32 08/25/2014   Lab Results  Component Value Date   ALT 15 08/25/2014   AST 17 08/25/2014   ALKPHOS 56 08/25/2014   BILITOT 0.5 08/25/2014     NARRATIVE: [name] was seen today for weekly treatment management. The chart was checked  and the patient's films were reviewed.  Weight and vitals stable. Denies pain or fatigue. Reports nocturia x 1-2. Denies dysuria, urgency, incontinence or difficulty emptying. Describes a strong steady urine stream. Denies diarrhea. Oriented patient to staff and routine of the clinic. Provided patient with RADIATION THERAPY AND YOU handbook then, reviewed pertinent information. Educated patient reference potential side effects and management such as, fatigue, diarrhea, and urinary bladder changes. Afforded patient the opportunity to ask questions and answered those to the best of my ability. Provided patient with my business card and encouraged him to call with needs. Patient verbalized understanding of all reviewed.   BP 165/75 mmHg  Pulse 60  Resp 16  Wt 227 lb 3.2 oz (103.057 kg)  SpO2 100%  Wt Readings from Last 3 Encounters:  12/19/14 227 lb  3.2 oz (103.057 kg)  08/25/14 231 lb (104.781 kg)  08/15/14 227 lb 9.6 oz (103.239 kg)     PHYSICAL EXAMINATION: weight is 227 lb 3.2 oz (103.057 kg). His blood pressure is 165/75 and his pulse is 60. His respiration is 16 and oxygen saturation is 100%.        ASSESSMENT: The patient is doing satisfactorily with treatment.  PLAN: We will continue with the patient's radiation treatment as planned.

## 2014-12-19 NOTE — Addendum Note (Signed)
Encounter addended by: Heywood Footman, RN on: 12/19/2014  8:23 PM<BR>     Documentation filed: Inpatient Patient Education, Chief Complaint Section

## 2014-12-19 NOTE — Progress Notes (Addendum)
Weight and vitals stable. Denies pain or fatigue. Reports nocturia x 1-2. Denies dysuria, urgency, incontinence or difficulty emptying. Describes a strong steady urine stream. Denies diarrhea. Oriented patient to staff and routine of the clinic. Provided patient with RADIATION THERAPY AND YOU handbook then, reviewed pertinent information. Educated patient reference potential side effects and management such as, fatigue, diarrhea, and urinary bladder changes. Afforded patient the opportunity to ask questions and answered those to the best of my ability. Provided patient with my business card and encouraged him to call with needs. Patient verbalized understanding of all reviewed.   BP 165/75 mmHg  Pulse 60  Resp 16  Wt 227 lb 3.2 oz (103.057 kg)  SpO2 100%  Wt Readings from Last 3 Encounters:  12/19/14 227 lb 3.2 oz (103.057 kg)  08/25/14 231 lb (104.781 kg)  08/15/14 227 lb 9.6 oz (103.239 kg)

## 2014-12-22 ENCOUNTER — Ambulatory Visit
Admission: RE | Admit: 2014-12-22 | Discharge: 2014-12-22 | Disposition: A | Discharge status: Home / Self Care | Payer: Medicare Other | Source: Ambulatory Visit | Attending: Radiation Oncology | Admitting: Radiation Oncology

## 2014-12-22 DIAGNOSIS — Z510 Encounter for antineoplastic radiation therapy: Secondary | ICD-10-CM | POA: Diagnosis not present

## 2014-12-23 ENCOUNTER — Ambulatory Visit
Admission: RE | Admit: 2014-12-23 | Discharge: 2014-12-23 | Disposition: A | Discharge status: Home / Self Care | Payer: Medicare Other | Source: Ambulatory Visit | Attending: Radiation Oncology | Admitting: Radiation Oncology

## 2014-12-23 DIAGNOSIS — Z510 Encounter for antineoplastic radiation therapy: Secondary | ICD-10-CM | POA: Diagnosis not present

## 2014-12-24 ENCOUNTER — Ambulatory Visit
Admission: RE | Admit: 2014-12-24 | Discharge: 2014-12-24 | Disposition: A | Discharge status: Home / Self Care | Payer: Medicare Other | Source: Ambulatory Visit | Attending: Radiation Oncology | Admitting: Radiation Oncology

## 2014-12-24 DIAGNOSIS — Z510 Encounter for antineoplastic radiation therapy: Secondary | ICD-10-CM | POA: Diagnosis not present

## 2014-12-25 ENCOUNTER — Ambulatory Visit
Admission: RE | Admit: 2014-12-25 | Discharge: 2014-12-25 | Disposition: A | Discharge status: Home / Self Care | Payer: Medicare Other | Source: Ambulatory Visit | Attending: Radiation Oncology | Admitting: Radiation Oncology

## 2014-12-25 DIAGNOSIS — Z510 Encounter for antineoplastic radiation therapy: Secondary | ICD-10-CM | POA: Diagnosis not present

## 2014-12-26 ENCOUNTER — Encounter: Payer: Self-pay | Admitting: Radiation Oncology

## 2014-12-26 ENCOUNTER — Ambulatory Visit
Admission: RE | Admit: 2014-12-26 | Discharge: 2014-12-26 | Disposition: A | Discharge status: Home / Self Care | Payer: Medicare Other | Source: Ambulatory Visit | Attending: Radiation Oncology | Admitting: Radiation Oncology

## 2014-12-26 VITALS — BP 176/59 | HR 61 | Resp 16 | Wt 227.4 lb

## 2014-12-26 DIAGNOSIS — C61 Malignant neoplasm of prostate: Secondary | ICD-10-CM

## 2014-12-26 DIAGNOSIS — Z510 Encounter for antineoplastic radiation therapy: Secondary | ICD-10-CM | POA: Diagnosis not present

## 2014-12-26 NOTE — Progress Notes (Signed)
Weight and vitals stable. Denies pain. Reports mild manageable fatigue. Reports nocturia x 2. Denies dysuria, urgency, incontinence, or difficulty emptying. Describes a strong steady urine stream. Denies diarrhea.  BP 176/59 mmHg  Pulse 61  Resp 16  Wt 227 lb 6.4 oz (103.148 kg)  SpO2 100% Wt Readings from Last 3 Encounters:  12/26/14 227 lb 6.4 oz (103.148 kg)  12/19/14 227 lb 3.2 oz (103.057 kg)  08/25/14 231 lb (104.781 kg)

## 2014-12-26 NOTE — Progress Notes (Signed)
Department of Radiation Oncology  Phone:  [phone] Fax:        [phone]  Weekly Treatment Note    Name: Langley Ingalls. Pir[place] Date: 12/26/2014 MRN: [id number] DOB: 1940-09-16   Current dose: 15.6 Gy  Current fraction: 8   MEDICATIONS: Current Outpatient Prescriptions  Medication Sig Dispense Refill  . acetaminophen (TYLENOL) 500 MG tablet Take 500 mg by mouth every 6 (six) hours as needed.      . carvedilol (COREG) 25 MG tablet Take 0.5 tablets (12.5 mg total) by mouth 2 (two) times daily with a meal. 180 tablet 3  . Cholecalciferol (VITAMIN D) 2000 UNITS CAPS Take by mouth daily.      Marland Kitchen diltiazem (CARTIA XT) 180 MG 24 hr capsule Take 180 mg by mouth 2 (two) times daily.    . dorzolamide (TRUSOPT) 2 % ophthalmic solution   2  . fexofenadine (ALLEGRA) 180 MG tablet Take 180 mg by mouth.    . fluticasone (FLONASE) 50 MCG/ACT nasal spray Place 2 sprays into both nostrils daily. 48 g 3  . Glucosamine-Chondroit-Vit C-Mn (GLUCOSAMINE-CHONDROITIN) CAPS Take 1,500 mg by mouth.    Marland Kitchen HYDROcodone-homatropine (HYCODAN) 5-1.5 MG/5ML syrup Take 5 mLs by mouth every 6 (six) hours as needed for cough. 120 mL 0  . latanoprost (XALATAN) 0.005 % ophthalmic solution Place 1 drop into both eyes at bedtime.     Marland Kitchen levothyroxine (SYNTHROID) 50 MCG tablet Take 1 tablet (50 mcg total) by mouth daily. 90 tablet 3  . methocarbamol (ROBAXIN) 750 MG tablet Take 1 tablet (750 mg total) by mouth every 8 (eight) hours as needed for muscle spasms. 60 tablet 3  . methocarbamol (ROBAXIN) 750 MG tablet Take 750 mg by mouth.    . Multiple Vitamin (MULTIVITAMIN) tablet Take 1 tablet by mouth daily.      . niacin 500 MG tablet Take 500 mg by mouth daily with breakfast.      . Omega-3 Fatty Acids (FISH OIL) 1200 MG CAPS Take by mouth daily.      Marland Kitchen omeprazole (PRILOSEC) 20 MG capsule Take 20 mg by mouth 2 (two) times daily before a meal.     . pioglitazone (ACTOS) 30 MG tablet Take 1 tablet (30 mg total) by mouth  daily. 90 tablet 3  . pravastatin (PRAVACHOL) 40 MG tablet Take 1 tablet (40 mg total) by mouth daily. 90 tablet 3  . predniSONE (DELTASONE) 50 MG tablet Take 1 pill daily for 5 days. 5 tablet 0  . quinapril (ACCUPRIL) 20 MG tablet Take 1 tablet (20 mg total) by mouth daily. 90 tablet 3  . timolol (TIMOPTIC) 0.5 % ophthalmic solution   1  . Timolol Maleate 0.5 % (DAILY) SOLN      No current facility-administered medications for this encounter.     ALLERGIES: Aspirin; Nsaids; Amoxicillin-pot clavulanate; and Sulfa antibiotics   LABORATORY DATA:  Lab Results  Component Value Date   WBC 10.2 04/23/2014   HGB 13.4 04/23/2014   HCT 39.9 04/23/2014   MCV 85.6 04/23/2014   PLT 228.0 04/23/2014   Lab Results  Component Value Date   NA 138 08/25/2014   K 3.9 08/25/2014   CL 101 08/25/2014   CO2 32 08/25/2014   Lab Results  Component Value Date   ALT 15 08/25/2014   AST 17 08/25/2014   ALKPHOS 56 08/25/2014   BILITOT 0.5 08/25/2014     NARRATIVE: Gwyndolyn Saxon R. Gidney was seen today for weekly treatment management. The chart was  checked and the patient's films were reviewed.  Weight and vitals stable. Denies pain. Reports mild manageable fatigue. Reports nocturia x 2. Denies dysuria, urgency, incontinence, or difficulty emptying. Describes a strong steady urine stream. Denies diarrhea.   PHYSICAL EXAMINATION: weight is 227 lb 6.4 oz (103.148 kg). His blood pressure is 176/59 and his pulse is 61. His respiration is 16 and oxygen saturation is 100%.        ASSESSMENT: The patient is doing satisfactorily with treatment.  PLAN: We will continue with the patient's radiation treatment as planned.   This document serves as a record of services personally performed by Kyung Rudd, MD. It was created on his behalf by Arlyce Harman, a trained medical scribe. The creation of this record is based on the scribe's personal observations and the provider's statements to them. This document has  been checked and approved by the attending provider. ------------------------------------------------  Jodelle Gross, MD, PhD

## 2014-12-29 ENCOUNTER — Ambulatory Visit
Admission: RE | Admit: 2014-12-29 | Discharge: 2014-12-29 | Disposition: A | Discharge status: Home / Self Care | Payer: Medicare Other | Source: Ambulatory Visit | Attending: Radiation Oncology | Admitting: Radiation Oncology

## 2014-12-29 DIAGNOSIS — Z510 Encounter for antineoplastic radiation therapy: Secondary | ICD-10-CM | POA: Diagnosis not present

## 2014-12-30 ENCOUNTER — Ambulatory Visit
Admission: RE | Admit: 2014-12-30 | Discharge: 2014-12-30 | Disposition: A | Discharge status: Home / Self Care | Payer: Medicare Other | Source: Ambulatory Visit | Attending: Radiation Oncology | Admitting: Radiation Oncology

## 2014-12-30 DIAGNOSIS — Z510 Encounter for antineoplastic radiation therapy: Secondary | ICD-10-CM | POA: Diagnosis not present

## 2014-12-31 ENCOUNTER — Ambulatory Visit
Admission: RE | Admit: 2014-12-31 | Discharge: 2014-12-31 | Disposition: A | Discharge status: Home / Self Care | Payer: Medicare Other | Source: Ambulatory Visit | Attending: Radiation Oncology | Admitting: Radiation Oncology

## 2014-12-31 DIAGNOSIS — Z510 Encounter for antineoplastic radiation therapy: Secondary | ICD-10-CM | POA: Diagnosis not present

## 2015-01-01 ENCOUNTER — Ambulatory Visit
Admission: RE | Admit: 2015-01-01 | Discharge: 2015-01-01 | Disposition: A | Discharge status: Home / Self Care | Payer: Medicare Other | Source: Ambulatory Visit | Attending: Radiation Oncology | Admitting: Radiation Oncology

## 2015-01-01 DIAGNOSIS — Z510 Encounter for antineoplastic radiation therapy: Secondary | ICD-10-CM | POA: Diagnosis not present

## 2015-01-02 ENCOUNTER — Encounter: Payer: Self-pay | Admitting: Radiation Oncology

## 2015-01-02 ENCOUNTER — Ambulatory Visit
Admission: RE | Admit: 2015-01-02 | Discharge: 2015-01-02 | Disposition: A | Discharge status: Home / Self Care | Payer: Medicare Other | Source: Ambulatory Visit | Attending: Radiation Oncology | Admitting: Radiation Oncology

## 2015-01-02 ENCOUNTER — Encounter: Payer: Self-pay | Admitting: Medical Oncology

## 2015-01-02 VITALS — BP 156/78 | HR 59 | Resp 16 | Wt 223.8 lb

## 2015-01-02 DIAGNOSIS — Z510 Encounter for antineoplastic radiation therapy: Secondary | ICD-10-CM | POA: Diagnosis not present

## 2015-01-02 DIAGNOSIS — C61 Malignant neoplasm of prostate: Secondary | ICD-10-CM

## 2015-01-02 NOTE — Progress Notes (Signed)
  Radiation Oncology         [phone]   Name: Eddie Ferguson. [place]oon MRN: [id number]   Date: 01/02/2015  DOB: 1940-06-25     Weekly Radiation Therapy Management    ICD-9-CM ICD-10-CM   1. Prostate cancer ([place]) 185 C61     Current Dose: 25.35 Gy  Planned Dose:  78 Gy  Narrative The patient presents for routine under treatment assessment.  Weight and vitals stable. Denies pain. Reports manageable fatigue. Reports one night this week he was up six times to void but, the rest of the week nocturia x 2. Reports occasionally he has mild dysuria. Denies urgency or incontinence. Denies difficulty emptying his bladder. Denies diarrhea.  The patient is without complaint. Set-up films were reviewed. The chart was checked.  Physical Findings  weight is 223 lb 12.8 oz (101.515 kg). His blood pressure is 156/78 and his pulse is 59. His respiration is 16. . Weight essentially stable.  No significant changes.  Impression The patient is tolerating radiation.  Plan Continue treatment as planned.         Sheral Apley Tammi Klippel, M.D.  This document serves as a record of services personally performed by Tyler Pita, MD. It was created on his behalf by Lendon Collar, a trained medical scribe. The creation of this record is based on the scribe's personal observations and the provider's statements to them. This document has been checked and approved by the attending provider.

## 2015-01-02 NOTE — Progress Notes (Signed)
Weight and vitals stable. Denies pain. Reports manageable fatigue. Reports one night this week he was up six times to void but, the rest of the week nocturia x 2. Reports occasionally he has mild dysuria. Denies urgency or incontinence. Denies difficulty emptying his bladder. Denies diarrhea.   BP 156/78 mmHg  Pulse 59  Resp 16  Wt 223 lb 12.8 oz (101.515 kg) Wt Readings from Last 3 Encounters:  01/02/15 223 lb 12.8 oz (101.515 kg)  12/26/14 227 lb 6.4 oz (103.148 kg)  12/19/14 227 lb 3.2 oz (103.057 kg)

## 2015-01-04 ENCOUNTER — Ambulatory Visit: Admission: RE | Admit: 2015-01-04 | Payer: Medicare Other | Source: Ambulatory Visit

## 2015-01-05 ENCOUNTER — Ambulatory Visit: Payer: Medicare Other

## 2015-01-05 ENCOUNTER — Ambulatory Visit
Admission: RE | Admit: 2015-01-05 | Discharge: 2015-01-05 | Disposition: A | Discharge status: Home / Self Care | Payer: Medicare Other | Source: Ambulatory Visit | Attending: Radiation Oncology | Admitting: Radiation Oncology

## 2015-01-05 DIAGNOSIS — Z510 Encounter for antineoplastic radiation therapy: Secondary | ICD-10-CM | POA: Diagnosis not present

## 2015-01-06 ENCOUNTER — Ambulatory Visit
Admission: RE | Admit: 2015-01-06 | Discharge: 2015-01-06 | Disposition: A | Discharge status: Home / Self Care | Payer: Medicare Other | Source: Ambulatory Visit | Attending: Radiation Oncology | Admitting: Radiation Oncology

## 2015-01-06 DIAGNOSIS — Z510 Encounter for antineoplastic radiation therapy: Secondary | ICD-10-CM | POA: Diagnosis not present

## 2015-01-07 ENCOUNTER — Ambulatory Visit
Admission: RE | Admit: 2015-01-07 | Discharge: 2015-01-07 | Disposition: A | Discharge status: Home / Self Care | Payer: Medicare Other | Source: Ambulatory Visit | Attending: Radiation Oncology | Admitting: Radiation Oncology

## 2015-01-07 DIAGNOSIS — Z510 Encounter for antineoplastic radiation therapy: Secondary | ICD-10-CM | POA: Diagnosis not present

## 2015-01-08 ENCOUNTER — Ambulatory Visit
Admission: RE | Admit: 2015-01-08 | Discharge: 2015-01-08 | Disposition: A | Discharge status: Home / Self Care | Payer: Medicare Other | Source: Ambulatory Visit | Attending: Radiation Oncology | Admitting: Radiation Oncology

## 2015-01-08 DIAGNOSIS — Z510 Encounter for antineoplastic radiation therapy: Secondary | ICD-10-CM | POA: Diagnosis not present

## 2015-01-09 ENCOUNTER — Encounter: Payer: Self-pay | Admitting: Radiation Oncology

## 2015-01-09 ENCOUNTER — Ambulatory Visit
Admission: RE | Admit: 2015-01-09 | Discharge: 2015-01-09 | Disposition: A | Discharge status: Home / Self Care | Payer: Medicare Other | Source: Ambulatory Visit | Attending: Radiation Oncology | Admitting: Radiation Oncology

## 2015-01-09 VITALS — BP 171/77 | HR 56 | Resp 16 | Wt 221.8 lb

## 2015-01-09 DIAGNOSIS — Z510 Encounter for antineoplastic radiation therapy: Secondary | ICD-10-CM | POA: Diagnosis not present

## 2015-01-09 DIAGNOSIS — C61 Malignant neoplasm of prostate: Secondary | ICD-10-CM

## 2015-01-09 NOTE — Progress Notes (Signed)
  Radiation Oncology         [phone]   Name: Eddie Ferguson MRN: [id number]   Date: 01/09/2015  DOB: 15-Jan-1941     Weekly Radiation Therapy Management    ICD-9-CM ICD-10-CM   1. Prostate cancer ([place]) 185 C61     Current Dose: 35.1 Gy  Planned Dose:  78 Gy  Narrative The patient presents for routine under treatment assessment.  Weight stable. BP elevated. Denies pain. Reports manageable fatigue. Denies dysuria. Reports nocturia x 2. Denies urgency or incontinence. Denies difficulty emptying his bladder. Denies diarrhea.   The patient is without complaint. Set-up films were reviewed. The chart was checked.  Physical Findings  weight is 221 lb 12.8 oz (100.608 kg). His blood pressure is 171/77 and his pulse is 56. His respiration is 16. . Weight essentially stable.  No significant changes.  Impression The patient is tolerating radiation.  Plan Continue treatment as planned.         Sheral Apley Tammi Klippel, M.D.  This document serves as a record of services personally performed by Tyler Pita, MD. It was created on his behalf by Arlyce Harman, a trained medical scribe. The creation of this record is based on the scribe's personal observations and the provider's statements to them. This document has been checked and approved by the attending provider.

## 2015-01-09 NOTE — Progress Notes (Signed)
Weight stable. BP elevated. Denies pain. Reports manageable fatigue. Denies dysuria. Reports nocturia x 2. Denies urgency or incontinence. Denies difficulty emptying his bladder. Denies diarrhea.   BP 171/77 mmHg  Pulse 56  Resp 16  Wt 221 lb 12.8 oz (100.608 kg) Wt Readings from Last 3 Encounters:  01/09/15 221 lb 12.8 oz (100.608 kg)  01/02/15 223 lb 12.8 oz (101.515 kg)  12/26/14 227 lb 6.4 oz (103.148 kg)

## 2015-01-12 ENCOUNTER — Ambulatory Visit
Admission: RE | Admit: 2015-01-12 | Discharge: 2015-01-12 | Disposition: A | Discharge status: Home / Self Care | Payer: Medicare Other | Source: Ambulatory Visit | Attending: Radiation Oncology | Admitting: Radiation Oncology

## 2015-01-12 DIAGNOSIS — Z510 Encounter for antineoplastic radiation therapy: Secondary | ICD-10-CM | POA: Diagnosis not present

## 2015-01-13 ENCOUNTER — Ambulatory Visit
Admission: RE | Admit: 2015-01-13 | Discharge: 2015-01-13 | Disposition: A | Discharge status: Home / Self Care | Payer: Medicare Other | Source: Ambulatory Visit | Attending: Radiation Oncology | Admitting: Radiation Oncology

## 2015-01-13 DIAGNOSIS — Z510 Encounter for antineoplastic radiation therapy: Secondary | ICD-10-CM | POA: Diagnosis not present

## 2015-01-14 ENCOUNTER — Ambulatory Visit: Payer: Medicare Other

## 2015-01-14 DIAGNOSIS — Z510 Encounter for antineoplastic radiation therapy: Secondary | ICD-10-CM | POA: Diagnosis not present

## 2015-01-15 ENCOUNTER — Ambulatory Visit: Payer: Medicare Other

## 2015-01-15 DIAGNOSIS — Z510 Encounter for antineoplastic radiation therapy: Secondary | ICD-10-CM | POA: Diagnosis not present

## 2015-01-16 ENCOUNTER — Ambulatory Visit
Admission: RE | Admit: 2015-01-16 | Discharge: 2015-01-16 | Disposition: A | Discharge status: Home / Self Care | Payer: Medicare Other | Source: Ambulatory Visit | Attending: Radiation Oncology | Admitting: Radiation Oncology

## 2015-01-16 ENCOUNTER — Ambulatory Visit: Payer: Medicare Other

## 2015-01-16 ENCOUNTER — Encounter: Payer: Self-pay | Admitting: Radiation Oncology

## 2015-01-16 VITALS — BP 156/68 | HR 53 | Temp 98.5°F | Ht 69.0 in | Wt 223.3 lb

## 2015-01-16 DIAGNOSIS — C61 Malignant neoplasm of prostate: Secondary | ICD-10-CM

## 2015-01-16 DIAGNOSIS — Z510 Encounter for antineoplastic radiation therapy: Secondary | ICD-10-CM | POA: Diagnosis not present

## 2015-01-16 NOTE — Progress Notes (Signed)
  Radiation Oncology         [phone]   Name: Eddie Ferguson. Eddie Ferguson MRN: [id number]   Date: 01/16/2015  DOB: 07-Oct-1940     Weekly Radiation Therapy Management    ICD-9-CM ICD-10-CM   1. Prostate cancer ([place]) 185 C61     Current Dose: 44.85 Gy  Planned Dose:  78 Gy  Narrative The patient presents for routine under treatment assessment.  The patient is here for his 23rd fraction of radiation to his prostate. He reports some fatigue and is going to bed earlier than his normal schedule. He reports nocturia x 2-4. He denies frequency, urgency, and reports he feels he is emptying his bladder. He reports normal bowel movements. He denies any other problems at this time.   The patient is without complaint. Set-up films were reviewed. The chart was checked.  Physical Findings  height is 5\' 9"  (1.753 m) and weight is 223 lb 4.8 oz (101.288 kg). His temperature is 98.5 F (36.9 C). His blood pressure is 156/68 and his pulse is 53. His oxygen saturation is 98%.  Weight essentially stable.  No significant changes.  Impression The patient is tolerating radiation.  Plan Continue treatment as planned.      Sheral Apley Tammi Klippel, M.D.  This document serves as a record of services personally performed by Tyler Pita, MD. It was created on his behalf by Darcus Austin, a trained medical scribe. The creation of this record is based on the scribe's personal observations and the provider's statements to them. This document has been checked and approved by the attending provider.

## 2015-01-16 NOTE — Progress Notes (Signed)
Eddie Ferguson is here for his 23rd fraction of radiation to his prostate. He reports some fatigue and is going to bed earlier than his normal schedule. He reports urinating 2-4 times a night. He denies frequency, urgency, and reports he feels he is emptying his bladder. He reports normal bowel movements. He denies any other problems at this time.   BP 156/68 mmHg  Pulse 53  Temp(Src) 98.5 F (36.9 C)  Ht 5\' 9"  (1.753 m)  Wt 223 lb 4.8 oz (101.288 kg)  BMI 32.96 kg/m2  SpO2 98%

## 2015-01-19 ENCOUNTER — Ambulatory Visit: Payer: Medicare Other

## 2015-01-19 DIAGNOSIS — Z510 Encounter for antineoplastic radiation therapy: Secondary | ICD-10-CM | POA: Diagnosis not present

## 2015-01-20 ENCOUNTER — Ambulatory Visit: Payer: Medicare Other

## 2015-01-20 DIAGNOSIS — Z510 Encounter for antineoplastic radiation therapy: Secondary | ICD-10-CM | POA: Diagnosis not present

## 2015-01-21 ENCOUNTER — Ambulatory Visit: Payer: Medicare Other

## 2015-01-21 DIAGNOSIS — Z510 Encounter for antineoplastic radiation therapy: Secondary | ICD-10-CM | POA: Diagnosis not present

## 2015-01-22 ENCOUNTER — Ambulatory Visit: Payer: Medicare Other

## 2015-01-22 DIAGNOSIS — Z510 Encounter for antineoplastic radiation therapy: Secondary | ICD-10-CM | POA: Diagnosis not present

## 2015-01-23 ENCOUNTER — Ambulatory Visit: Payer: Medicare Other

## 2015-01-23 ENCOUNTER — Encounter: Payer: Self-pay | Admitting: Radiation Oncology

## 2015-01-23 ENCOUNTER — Ambulatory Visit
Admission: RE | Admit: 2015-01-23 | Discharge: 2015-01-23 | Disposition: A | Discharge status: Home / Self Care | Payer: Medicare Other | Source: Ambulatory Visit | Attending: Radiation Oncology | Admitting: Radiation Oncology

## 2015-01-23 VITALS — BP 164/72 | HR 63 | Temp 98.7°F | Ht 69.0 in | Wt 222.4 lb

## 2015-01-23 DIAGNOSIS — Z510 Encounter for antineoplastic radiation therapy: Secondary | ICD-10-CM | POA: Diagnosis not present

## 2015-01-23 DIAGNOSIS — C61 Malignant neoplasm of prostate: Secondary | ICD-10-CM

## 2015-01-23 NOTE — Progress Notes (Signed)
  Radiation Oncology         [phone]   Name: Eddie Ferguson. Somma MRN: [id number]   Date: 01/23/2015  DOB: [birth year]     Weekly Radiation Therapy Management    ICD-9-CM ICD-10-CM   1. Prostate cancer ([place]) 185 C61     Current Dose: 54.6 Gy  Planned Dose:  78 Gy  Narrative The patient presents for routine under treatment assessment.  Mr. Lathem here today for UT has received 28 fractions. Energy level is low in the evening. No problems passing urine. Usually up x two at night to BR. Having normal bowel elimination. Feels he in emptying his bladder.  The patient is without complaint. Set-up films were reviewed. The chart was checked.  Physical Findings  height is 5\' 9"  (1.753 m) and weight is 222 lb 6.4 oz (100.88 kg). His oral temperature is 98.7 F (37.1 C). His blood pressure is 164/72 and his pulse is 63. His oxygen saturation is 97%.  Weight essentially stable.  No significant changes.  Impression The patient is tolerating radiation. No complaints today.  Plan Continue treatment as planned.    This document serves as a record of services personally performed by Kyung Rudd, MD. It was created on his behalf by  Lendon Collar, a trained medical scribe. The creation of this record is based on the scribe's personal observations and the provider's statements to them. This document has been checked and approved by the attending provider.

## 2015-01-23 NOTE — Progress Notes (Signed)
Mr. Gruett here today for UT has received 28 fractions.  Energy level is low in the evening.   No problems passing urine.  Usually up x two at night to BR.  Having normal bowel elimination.  Feels he in emptying his bladder.  BP 164/72 mmHg  Pulse 63  Temp(Src) 98.7 F (37.1 C) (Oral)  Ht 5\' 9"  (1.753 m)  Wt 222 lb 6.4 oz (100.88 kg)  BMI 32.83 kg/m2  SpO2 97%  Wt Readings from Last 3 Encounters:  01/23/15 222 lb 6.4 oz (100.88 kg)  01/16/15 223 lb 4.8 oz (101.288 kg)  01/09/15 221 lb 12.8 oz (100.608 kg)

## 2015-01-25 ENCOUNTER — Ambulatory Visit
Admission: RE | Admit: 2015-01-25 | Discharge: 2015-01-25 | Disposition: A | Discharge status: Home / Self Care | Payer: Medicare Other | Source: Ambulatory Visit | Attending: Radiation Oncology | Admitting: Radiation Oncology

## 2015-01-25 DIAGNOSIS — Z510 Encounter for antineoplastic radiation therapy: Secondary | ICD-10-CM | POA: Diagnosis not present

## 2015-01-26 ENCOUNTER — Ambulatory Visit
Admission: RE | Admit: 2015-01-26 | Discharge: 2015-01-26 | Disposition: A | Discharge status: Home / Self Care | Payer: Medicare Other | Source: Ambulatory Visit | Attending: Radiation Oncology | Admitting: Radiation Oncology

## 2015-01-26 DIAGNOSIS — Z510 Encounter for antineoplastic radiation therapy: Secondary | ICD-10-CM | POA: Diagnosis not present

## 2015-01-27 ENCOUNTER — Encounter: Payer: Self-pay | Admitting: Radiation Oncology

## 2015-01-27 ENCOUNTER — Ambulatory Visit
Admission: RE | Admit: 2015-01-27 | Discharge: 2015-01-27 | Disposition: A | Discharge status: Home / Self Care | Payer: Medicare Other | Source: Ambulatory Visit | Attending: Radiation Oncology | Admitting: Radiation Oncology

## 2015-01-27 VITALS — BP 158/61 | HR 60 | Resp 16 | Wt 220.9 lb

## 2015-01-27 DIAGNOSIS — Z510 Encounter for antineoplastic radiation therapy: Secondary | ICD-10-CM | POA: Diagnosis not present

## 2015-01-27 DIAGNOSIS — C61 Malignant neoplasm of prostate: Secondary | ICD-10-CM

## 2015-01-27 LAB — HM DIABETES EYE EXAM

## 2015-01-27 NOTE — Progress Notes (Signed)
Department of Radiation Oncology  Phone:  [phone] Fax:        [phone]  Weekly Treatment Note    Name: Eddie Ferguson. Dobish Date: 01/27/2015 MRN: [id number] DOB: [birth year]   Current dose: 16.45 Gy  Current fraction: 31   MEDICATIONS: Current Outpatient Prescriptions  Medication Sig Dispense Refill  . acetaminophen (TYLENOL) 500 MG tablet Take 500 mg by mouth every 6 (six) hours as needed.      . carvedilol (COREG) 25 MG tablet Take 0.5 tablets (12.5 mg total) by mouth 2 (two) times daily with a meal. 180 tablet 3  . Cholecalciferol (VITAMIN D) 2000 UNITS CAPS Take by mouth daily.      Marland Kitchen diltiazem (CARTIA XT) 180 MG 24 hr capsule Take 180 mg by mouth 2 (two) times daily.    . dorzolamide (TRUSOPT) 2 % ophthalmic solution   2  . fexofenadine (ALLEGRA) 180 MG tablet Take 180 mg by mouth.    . fluticasone (FLONASE) 50 MCG/ACT nasal spray Place 2 sprays into both nostrils daily. 48 g 3  . Glucosamine-Chondroit-Vit C-Mn (GLUCOSAMINE-CHONDROITIN) CAPS Take 1,500 mg by mouth.    Marland Kitchen HYDROcodone-homatropine (HYCODAN) 5-1.5 MG/5ML syrup Take 5 mLs by mouth every 6 (six) hours as needed for cough. 120 mL 0  . latanoprost (XALATAN) 0.005 % ophthalmic solution Place 1 drop into both eyes at bedtime.     Marland Kitchen levothyroxine (SYNTHROID) 50 MCG tablet Take 1 tablet (50 mcg total) by mouth daily. 90 tablet 3  . methocarbamol (ROBAXIN) 750 MG tablet Take 1 tablet (750 mg total) by mouth every 8 (eight) hours as needed for muscle spasms. 60 tablet 3  . methocarbamol (ROBAXIN) 750 MG tablet Take 750 mg by mouth.    . Multiple Vitamin (MULTIVITAMIN) tablet Take 1 tablet by mouth daily.      . niacin 500 MG tablet Take 500 mg by mouth daily with breakfast.      . Omega-3 Fatty Acids (FISH OIL) 1200 MG CAPS Take by mouth daily.      Marland Kitchen omeprazole (PRILOSEC) 20 MG capsule Take 20 mg by mouth 2 (two) times daily before a meal.     . pioglitazone (ACTOS) 30 MG tablet Take 1 tablet (30 mg total) by  mouth daily. 90 tablet 3  . pravastatin (PRAVACHOL) 40 MG tablet Take 1 tablet (40 mg total) by mouth daily. 90 tablet 3  . quinapril (ACCUPRIL) 20 MG tablet Take 1 tablet (20 mg total) by mouth daily. 90 tablet 3  . timolol (TIMOPTIC) 0.5 % ophthalmic solution   1  . Timolol Maleate 0.5 % (DAILY) SOLN      No current facility-administered medications for this encounter.     ALLERGIES: Aspirin; Nsaids; Amoxicillin-pot clavulanate; and Sulfa antibiotics   LABORATORY DATA:  Lab Results  Component Value Date   WBC 10.2 04/23/2014   HGB 13.4 04/23/2014   HCT 39.9 04/23/2014   MCV 85.6 04/23/2014   PLT 228.0 04/23/2014   Lab Results  Component Value Date   NA 138 08/25/2014   K 3.9 08/25/2014   CL 101 08/25/2014   CO2 32 08/25/2014   Lab Results  Component Value Date   ALT 15 08/25/2014   AST 17 08/25/2014   ALKPHOS 56 08/25/2014   BILITOT 0.5 08/25/2014     NARRATIVE: Eddie Ferguson was seen today for weekly treatment management. The chart was checked and the patient's films were reviewed.  Weight and vitals stable. Denies pain. Reports nocturia x  2-4. Reports on the night of nocturia x 4 he doesn't feel as though he is emptying his bladder completely. Reports mild intermittent dysuria. Reports occasionally when he void he also passes a small amount of stool. Denies hematuria. Reports fatigue.   BP 158/61 mmHg  Pulse 60  Resp 16  Wt 220 lb 14.4 oz (100.2 kg) Wt Readings from Last 3 Encounters:  01/27/15 220 lb 14.4 oz (100.2 kg)  01/23/15 222 lb 6.4 oz (100.88 kg)  01/16/15 223 lb 4.8 oz (101.288 kg)     PHYSICAL EXAMINATION: weight is 220 lb 14.4 oz (100.2 kg). His blood pressure is 158/61 and his pulse is 60. His respiration is 16.        ASSESSMENT: The patient is doing satisfactorily with treatment.  The patient is clinically stable, overall doing quite well.  PLAN: We will continue with the patient's radiation treatment as planned.

## 2015-01-27 NOTE — Progress Notes (Addendum)
Weight and vitals stable. Denies pain. Reports nocturia x 2-4. Reports on the night of nocturia x 4 he doesn't feel as though he is emptying his bladder completely. Reports mild intermittent dysuria. Reports occasionally when he void he also passes a small amount of stool. Denies hematuria. Reports fatigue.   BP 158/61 mmHg  Pulse 60  Resp 16  Wt 220 lb 14.4 oz (100.2 kg) Wt Readings from Last 3 Encounters:  01/27/15 220 lb 14.4 oz (100.2 kg)  01/23/15 222 lb 6.4 oz (100.88 kg)  01/16/15 223 lb 4.8 oz (101.288 kg)

## 2015-01-28 ENCOUNTER — Ambulatory Visit
Admission: RE | Admit: 2015-01-28 | Discharge: 2015-01-28 | Disposition: A | Discharge status: Home / Self Care | Payer: Medicare Other | Source: Ambulatory Visit | Attending: Radiation Oncology | Admitting: Radiation Oncology

## 2015-01-28 ENCOUNTER — Encounter: Payer: Self-pay | Admitting: Medical Oncology

## 2015-01-28 DIAGNOSIS — Z510 Encounter for antineoplastic radiation therapy: Secondary | ICD-10-CM | POA: Diagnosis not present

## 2015-01-30 ENCOUNTER — Ambulatory Visit: Payer: Medicare Other

## 2015-02-02 ENCOUNTER — Ambulatory Visit
Admission: RE | Admit: 2015-02-02 | Discharge: 2015-02-02 | Disposition: A | Discharge status: Home / Self Care | Payer: Medicare Other | Source: Ambulatory Visit | Attending: Radiation Oncology | Admitting: Radiation Oncology

## 2015-02-02 DIAGNOSIS — Z510 Encounter for antineoplastic radiation therapy: Secondary | ICD-10-CM | POA: Diagnosis not present

## 2015-02-03 ENCOUNTER — Ambulatory Visit
Admission: RE | Admit: 2015-02-03 | Discharge: 2015-02-03 | Disposition: A | Discharge status: Home / Self Care | Payer: Medicare Other | Source: Ambulatory Visit | Attending: Radiation Oncology | Admitting: Radiation Oncology

## 2015-02-03 DIAGNOSIS — Z510 Encounter for antineoplastic radiation therapy: Secondary | ICD-10-CM | POA: Diagnosis not present

## 2015-02-04 ENCOUNTER — Ambulatory Visit
Admission: RE | Admit: 2015-02-04 | Discharge: 2015-02-04 | Disposition: A | Discharge status: Home / Self Care | Payer: Medicare Other | Source: Ambulatory Visit | Attending: Radiation Oncology | Admitting: Radiation Oncology

## 2015-02-04 DIAGNOSIS — Z510 Encounter for antineoplastic radiation therapy: Secondary | ICD-10-CM | POA: Diagnosis not present

## 2015-02-05 ENCOUNTER — Ambulatory Visit
Admission: RE | Admit: 2015-02-05 | Discharge: 2015-02-05 | Disposition: A | Discharge status: Home / Self Care | Payer: Medicare Other | Source: Ambulatory Visit | Attending: Radiation Oncology | Admitting: Radiation Oncology

## 2015-02-05 DIAGNOSIS — Z510 Encounter for antineoplastic radiation therapy: Secondary | ICD-10-CM | POA: Diagnosis not present

## 2015-02-06 ENCOUNTER — Encounter: Payer: Self-pay | Admitting: Family Medicine

## 2015-02-06 ENCOUNTER — Ambulatory Visit
Admission: RE | Admit: 2015-02-06 | Discharge: 2015-02-06 | Disposition: A | Discharge status: Home / Self Care | Payer: Medicare Other | Source: Ambulatory Visit | Attending: Radiation Oncology | Admitting: Radiation Oncology

## 2015-02-06 ENCOUNTER — Encounter: Payer: Self-pay | Admitting: Radiation Oncology

## 2015-02-06 VITALS — BP 162/73 | HR 59 | Resp 16 | Wt 225.8 lb

## 2015-02-06 DIAGNOSIS — C61 Malignant neoplasm of prostate: Secondary | ICD-10-CM

## 2015-02-06 DIAGNOSIS — Z510 Encounter for antineoplastic radiation therapy: Secondary | ICD-10-CM | POA: Diagnosis not present

## 2015-02-06 NOTE — Progress Notes (Signed)
-   Radiation Oncology         [phone]   Name: Eddie Ferguson. [place] MRN: [id number]   Date: 02/06/2015  DOB: February 09, 1941     Weekly Radiation Therapy Management    ICD-9-CM ICD-10-CM   1. Prostate cancer ([place]) 185 C61     Current Dose: 72.15 Gy  Planned Dose:  78 Gy  Narrative The patient presents for routine under treatment assessment.  Weight and vitals stable. Denies pain. Reports nocturia x 3. Reports mild intermittent dysuria. Reports occasionally when he void he also passes a small amount of stool. Denies hematuria. Reports fatigue. Scheduled to follow up with his urologist on March 4th. One month follow up appointment card given today since patient completes on Wednesday of the coming week. Patient has a primary care doctor's appointment on December 20th.  The patient is without complaint. Set-up films were reviewed. The chart was checked.  Physical Findings  weight is 225 lb 12.8 oz (102.422 kg). His blood pressure is 162/73 and his pulse is 59. His respiration is 16 and oxygen saturation is 100%.  Weight essentially stable.  No significant changes.  Impression The patient is tolerating radiation.  Plan Continue treatment as planned. Patient is scheduled to complete radiation next Wednesday and return for follow-up in one month.      Sheral Apley Tammi Klippel, M.D.  This document serves as a record of services personally performed by Tyler Pita, MD. It was created on his behalf by Arlyce Harman, a trained medical scribe. The creation of this record is based on the scribe's personal observations and the provider's statements to them. This document has been checked and approved by the attending provider.

## 2015-02-06 NOTE — Progress Notes (Addendum)
Weight and vitals stable. Denies pain. Reports nocturia x 3. Reports mild intermittent dysuria. Reports occasionally when he void he also passes a small amount of stool. Denies hematuria. Reports fatigue. Scheduled to follow up with his urologist on March 4th. One month follow up appointment card given today since patient completes on Wednesday of the coming week.   BP 162/73 mmHg  Pulse 59  Resp 16  Wt 225 lb 12.8 oz (102.422 kg)  SpO2 100% Wt Readings from Last 3 Encounters:  02/06/15 225 lb 12.8 oz (102.422 kg)  01/27/15 220 lb 14.4 oz (100.2 kg)  01/23/15 222 lb 6.4 oz (100.88 kg)

## 2015-02-09 ENCOUNTER — Ambulatory Visit
Admission: RE | Admit: 2015-02-09 | Discharge: 2015-02-09 | Disposition: A | Discharge status: Home / Self Care | Payer: Medicare Other | Source: Ambulatory Visit | Attending: Radiation Oncology | Admitting: Radiation Oncology

## 2015-02-09 DIAGNOSIS — Z510 Encounter for antineoplastic radiation therapy: Secondary | ICD-10-CM | POA: Diagnosis not present

## 2015-02-10 ENCOUNTER — Ambulatory Visit
Admission: RE | Admit: 2015-02-10 | Discharge: 2015-02-10 | Disposition: A | Discharge status: Home / Self Care | Payer: Medicare Other | Source: Ambulatory Visit | Attending: Radiation Oncology | Admitting: Radiation Oncology

## 2015-02-10 ENCOUNTER — Ambulatory Visit: Payer: Medicare Other

## 2015-02-10 DIAGNOSIS — Z510 Encounter for antineoplastic radiation therapy: Secondary | ICD-10-CM | POA: Diagnosis not present

## 2015-02-11 ENCOUNTER — Encounter: Payer: Self-pay | Admitting: Radiation Oncology

## 2015-02-11 ENCOUNTER — Ambulatory Visit
Admission: RE | Admit: 2015-02-11 | Discharge: 2015-02-11 | Disposition: A | Discharge status: Home / Self Care | Payer: Medicare Other | Source: Ambulatory Visit | Attending: Radiation Oncology | Admitting: Radiation Oncology

## 2015-02-11 DIAGNOSIS — Z510 Encounter for antineoplastic radiation therapy: Secondary | ICD-10-CM | POA: Diagnosis not present

## 2015-02-24 ENCOUNTER — Encounter: Payer: Self-pay | Admitting: Family Medicine

## 2015-02-24 ENCOUNTER — Ambulatory Visit (INDEPENDENT_AMBULATORY_CARE_PROVIDER_SITE_OTHER): Payer: Medicare Other | Admitting: Family Medicine

## 2015-02-24 VITALS — BP 150/80 | HR 56 | Temp 98.8°F | Wt 218.0 lb

## 2015-02-24 DIAGNOSIS — I10 Essential (primary) hypertension: Secondary | ICD-10-CM | POA: Diagnosis not present

## 2015-02-24 DIAGNOSIS — E119 Type 2 diabetes mellitus without complications: Secondary | ICD-10-CM

## 2015-02-24 DIAGNOSIS — Z23 Encounter for immunization: Secondary | ICD-10-CM

## 2015-02-24 DIAGNOSIS — E038 Other specified hypothyroidism: Secondary | ICD-10-CM

## 2015-02-24 DIAGNOSIS — E034 Atrophy of thyroid (acquired): Secondary | ICD-10-CM

## 2015-02-24 LAB — BASIC METABOLIC PANEL
BUN: 11 mg/dL (ref 6–23)
CO2: 31 mEq/L (ref 19–32)
Calcium: 9.1 mg/dL (ref 8.4–10.5)
Chloride: 103 mEq/L (ref 96–112)
Creatinine, Ser: 1.11 mg/dL (ref 0.40–1.50)
GFR: 68.32 mL/min (ref >60.00)
GLUCOSE: 116 mg/dL — AB (ref 70–99)
Potassium: 4.2 mEq/L (ref 3.5–5.1)
Sodium: 142 mEq/L (ref 135–145)

## 2015-02-24 LAB — HEMOGLOBIN A1C: Hgb A1c MFr Bld: 6.2 % (ref 4.6–6.5)

## 2015-02-24 LAB — TSH: TSH: 2.47 u[IU]/mL (ref 0.35–4.50)

## 2015-02-24 MED ORDER — CARVEDILOL 25 MG PO TABS: 12.5000 mg | ORAL_TABLET | Freq: Two times a day (BID) | ORAL | Status: DC

## 2015-02-24 MED ORDER — LEVOTHYROXINE SODIUM 50 MCG PO TABS: 50.0000 ug | ORAL_TABLET | Freq: Every day | ORAL | Status: DC

## 2015-02-24 MED ORDER — PRAVASTATIN SODIUM 40 MG PO TABS: 40.0000 mg | ORAL_TABLET | Freq: Every day | ORAL | Status: DC

## 2015-02-24 MED ORDER — PIOGLITAZONE HCL 30 MG PO TABS: 30.0000 mg | ORAL_TABLET | Freq: Every day | ORAL | Status: DC

## 2015-02-24 MED ORDER — QUINAPRIL HCL 40 MG PO TABS: 40.0000 mg | ORAL_TABLET | Freq: Every day | ORAL | Status: DC

## 2015-02-24 NOTE — Assessment & Plan Note (Signed)
S: poorly controlled on carvedilol 12.5mg  BID, diltiazem 180mg  XL, quinapril 20mg  BP Readings from Last 3 Encounters:  02/24/15 150/80  02/06/15 162/73  01/27/15 158/61  A/P:Continue current meds but increase quinapril to 40 mg and follow-up in one month. Patient has had orthostatic symptoms in the past will watch closely. hctz an option but watch GFR.

## 2015-02-24 NOTE — Assessment & Plan Note (Signed)
S: Well controlled. On Actos alone CBGs- does not regularly check but denies any highs or lows Lab Results  Component Value Date   HGBA1C 6.3 08/25/2014   HGBA1C 6.8* 04/23/2014   A/P: Repeat A1c today

## 2015-02-24 NOTE — Patient Instructions (Addendum)
Flu shot received today.  Labs before you go  Increase quinapril to 40mg . See me back in 1 month. Check blood pressure at home and bring log. See me sooner if new or worrisome symptoms.   Sent to [facility] ordered this encounter  Medications  . quinapril (ACCUPRIL) 40 MG tablet    Sig: Take 1 tablet (40 mg total) by mouth daily.    Dispense:  90 tablet    Refill:  3    Do not fill any medications without patient request.  . pravastatin (PRAVACHOL) 40 MG tablet    Sig: Take 1 tablet (40 mg total) by mouth daily.    Dispense:  90 tablet    Refill:  3    Do not fill any medications without patient request.  . pioglitazone (ACTOS) 30 MG tablet    Sig: Take 1 tablet (30 mg total) by mouth daily.    Dispense:  90 tablet    Refill:  3    Do not fill any medications without patient request.  . levothyroxine (SYNTHROID) 50 MCG tablet    Sig: Take 1 tablet (50 mcg total) by mouth daily.    Dispense:  90 tablet    Refill:  3    Do not fill any medications without patient request.  . carvedilol (COREG) 25 MG tablet    Sig: Take 0.5 tablets (12.5 mg total) by mouth 2 (two) times daily with a meal.    Dispense:  180 tablet    Refill:  3    Do not fill any medications without patient request.

## 2015-02-24 NOTE — Assessment & Plan Note (Signed)
S: Controlled on Synthroid 50 mcg Lab Results  Component Value Date   TSH 2.45 08/25/2014  A/P: Check TSH today

## 2015-02-24 NOTE — Progress Notes (Signed)
Garret Reddish, MD  Subjective:  Eddie Ferguson. [place]aco is a 74 y.o. year old very pleasant male patient who presents for/with See problem oriented charting ROS- No chest pain or shortness of breath. No headache or blurry vision.   Past Medical History-  Patient Active Problem List   Diagnosis Date Noted  . Prostate cancer ([place]) 05/28/2014    Priority: High  . Diabetes mellitus type 2, controlled ([place]) 04/23/2014    Priority: High  . Essential hypertension 04/23/2014    Priority: Medium  . Hyperlipidemia 04/23/2014    Priority: Medium  . Hypothyroidism 04/23/2014    Priority: Medium  . Stomach ulcer     Priority: Medium  . Adenomatous colon polyp 05/28/2014    Priority: Low  . Osteoarthritis 05/28/2014    Priority: Low  . Erectile dysfunction 05/28/2014    Priority: Low  . Basal cell carcinoma 05/28/2014    Priority: Low  . GERD (gastroesophageal reflux disease) 04/23/2014    Priority: Low  . Allergic rhinitis 04/23/2014    Priority: Low  . Glaucoma 04/23/2014    Priority: Low    Medications- reviewed and updated Current Outpatient Prescriptions  Medication Sig Dispense Refill  . carvedilol (COREG) 25 MG tablet Take 0.5 tablets (12.5 mg total) by mouth 2 (two) times daily with a meal. 180 tablet 3  . Cholecalciferol (VITAMIN D) 2000 UNITS CAPS Take by mouth daily.      Marland Kitchen diltiazem (CARTIA XT) 180 MG 24 hr capsule Take 180 mg by mouth 2 (two) times daily.    . dorzolamide (TRUSOPT) 2 % ophthalmic solution   2  . fexofenadine (ALLEGRA) 180 MG tablet Take 180 mg by mouth.    . fluticasone (FLONASE) 50 MCG/ACT nasal spray Place 2 sprays into both nostrils daily. 48 g 3  . Glucosamine-Chondroit-Vit C-Mn (GLUCOSAMINE-CHONDROITIN) CAPS Take 1,500 mg by mouth.    . latanoprost (XALATAN) 0.005 % ophthalmic solution Place 1 drop into both eyes at bedtime.     Marland Kitchen levothyroxine (SYNTHROID) 50 MCG tablet Take 1 tablet (50 mcg total) by mouth daily. 90 tablet 3  . methocarbamol (ROBAXIN)  750 MG tablet Take 1 tablet (750 mg total) by mouth every 8 (eight) hours as needed for muscle spasms. 60 tablet 3  . Multiple Vitamin (MULTIVITAMIN) tablet Take 1 tablet by mouth daily.      . niacin 500 MG tablet Take 500 mg by mouth daily with breakfast.      . Omega-3 Fatty Acids (FISH OIL) 1200 MG CAPS Take by mouth daily.      Marland Kitchen omeprazole (PRILOSEC) 20 MG capsule Take 20 mg by mouth 2 (two) times daily before a meal.     . pioglitazone (ACTOS) 30 MG tablet Take 1 tablet (30 mg total) by mouth daily. 90 tablet 3  . pravastatin (PRAVACHOL) 40 MG tablet Take 1 tablet (40 mg total) by mouth daily. 90 tablet 3  . quinapril (ACCUPRIL) 40 MG tablet Take 1 tablet (40 mg total) by mouth daily. 90 tablet 3  . timolol (TIMOPTIC) 0.5 % ophthalmic solution   1  . acetaminophen (TYLENOL) 500 MG tablet Take 500 mg by mouth every 6 (six) hours as needed. Reported on 02/24/2015     No current facility-administered medications for this visit.    Objective: BP 150/80 mmHg  Pulse 56  Temp(Src) 98.8 F (37.1 C)  Wt 218 lb (98.884 kg) Gen: NAD, resting comfortably CV: RRR no murmurs rubs or gallops Lungs: CTAB no crackles, wheeze,  rhonchi Abdomen: soft/nontender/nondistended/normal bowel sounds. No rebound or guarding.  Ext: no edema Skin: warm, dry Neuro: grossly normal, moves all extremities  Assessment/Plan:  Essential hypertension S: poorly controlled on carvedilol 12.5mg  BID, diltiazem 180mg  XL, quinapril 20mg  BP Readings from Last 3 Encounters:  02/24/15 150/80  02/06/15 162/73  01/27/15 158/61  A/P:Continue current meds but increase quinapril to 40 mg and follow-up in one month. Patient has had orthostatic symptoms in the past will watch closely. hctz an option but watch GFR.    Hypothyroidism S: Controlled on Synthroid 50 mcg Lab Results  Component Value Date   TSH 2.45 08/25/2014  A/P: Check TSH today   Diabetes mellitus type 2, controlled S: Well controlled. On Actos  alone CBGs- does not regularly check but denies any highs or lows Lab Results  Component Value Date   HGBA1C 6.3 08/25/2014   HGBA1C 6.8* 04/23/2014   A/P: Repeat A1c today   One month pressure check Return precautions advised.   Orders Placed This Encounter  Procedures  . Flu Vaccine QUAD 36+ mos IM  . Basic metabolic panel    [facility]  . Hemoglobin A1c    [place]  . TSH       [facility] ordered this encounter  Medications  . quinapril (ACCUPRIL) 40 MG tablet    Sig: Take 1 tablet (40 mg total) by mouth daily.    Dispense:  90 tablet    Refill:  3    Do not fill any medications without patient request.  . pravastatin (PRAVACHOL) 40 MG tablet    Sig: Take 1 tablet (40 mg total) by mouth daily.    Dispense:  90 tablet    Refill:  3    Do not fill any medications without patient request.  . pioglitazone (ACTOS) 30 MG tablet    Sig: Take 1 tablet (30 mg total) by mouth daily.    Dispense:  90 tablet    Refill:  3    Do not fill any medications without patient request.  . levothyroxine (SYNTHROID) 50 MCG tablet    Sig: Take 1 tablet (50 mcg total) by mouth daily.    Dispense:  90 tablet    Refill:  3    Do not fill any medications without patient request.  . carvedilol (COREG) 25 MG tablet    Sig: Take 0.5 tablets (12.5 mg total) by mouth 2 (two) times daily with a meal.    Dispense:  180 tablet    Refill:  3    Do not fill any medications without patient request.

## 2015-02-25 NOTE — Progress Notes (Signed)
  Radiation Oncology         [phone] ________________________________  Name: Eddie Ferguson. [place] MRN: [id number]  Date: 02/11/2015  DOB: 07/14/1940  End of Treatment Note  Diagnosis:   74 y.o. 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  Image guidance was performed with daily cone beam CT prior to each fraction to align to gold markers in the prostate and assure proper bladder and rectal fill volumes.  Immobilization was achieved with [employer] custom mold.  Narrative: The patient tolerated radiation treatment relatively well.   The patient experienced some minor urinary irritation and modest fatigue.  Denied pain. Reported nocturia x 3. Reported mild intermittent dysuria. Reported occasionally when he void he also passes a small amount of stool. Denied hematuria. Reported fatigue.  Plan: The patient has completed radiation treatment. He will return to radiation oncology clinic for routine followup in one month. I advised him to call or return sooner if he has any questions or concerns related to his recovery or treatment. Scheduled to follow up with his urologist on March 4th.  ________________________________  Sheral Apley Tammi Klippel, M.D.

## 2015-03-26 ENCOUNTER — Ambulatory Visit
Admission: RE | Admit: 2015-03-26 | Discharge: 2015-03-26 | Disposition: A | Discharge status: Home / Self Care | Payer: Medicare Other | Source: Ambulatory Visit | Attending: Radiation Oncology | Admitting: Radiation Oncology

## 2015-03-26 ENCOUNTER — Encounter: Payer: Self-pay | Admitting: Radiation Oncology

## 2015-03-26 VITALS — BP 166/83 | HR 61 | Resp 16 | Wt 218.0 lb

## 2015-03-26 DIAGNOSIS — C61 Malignant neoplasm of prostate: Secondary | ICD-10-CM

## 2015-03-26 NOTE — Progress Notes (Signed)
Weight and vitals stable. Denies pain. Reports nocturia x 1-3. Denies dysuria or hematuria. Denies difficulty emptying his bladder. Denies incontinence or leakage. Denies diarrhea. Reports his energy level has returned to normal. Follow up with PCP tomorrow and urologist 05/19/2015.  BP 166/83 mmHg  Pulse 61  Resp 16  Wt 218 lb (98.884 kg)  SpO2 100% Wt Readings from Last 3 Encounters:  03/26/15 218 lb (98.884 kg)  02/24/15 218 lb (98.884 kg)  02/06/15 225 lb 12.8 oz (102.422 kg)

## 2015-03-26 NOTE — Progress Notes (Signed)
Radiation Oncology         [phone]) [phone] ________________________________  Name: Eddie Ferguson. [place] MRN: [id number]  Date: 03/26/2015  DOB: May 10, 1940  Follow-Up Visit Note  CC: Eddie Reddish, MD  Eddie Bring, MD  Diagnosis:   75 y.o.  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   Interval Since Last Radiation:  1 month; 12/17/14-02/11/15  Narrative: Eddie Ferguson returns today for his first follow-up since completing his IMRT. Of note during treatment, he received a total of 78 Gy in 40 fractions of 1.95 Gy.  He seemed to tolerate treatment very well, and has plans to meet already with Eddie Ferguson in March 2017. He also has plans to meet with his primary care provider tomorrow to address his blood pressure.  ROS: On review of systems, the patient reports an overall he is doing very well and he reports that his energy is now returned. He states that he is still experiencing some nocturia 1-3 times each evening but also reports that this has a lot to do with his oral intake late in the evening. He denies any dysuria hematuria flank pain, fevers, chills, diarrhea or rectal bleeding. A complete review of systems is obtained and is otherwise negative.       ALLERGIES:  is allergic to aspirin; nsaids; amoxicillin-pot clavulanate; and sulfa antibiotics.  Meds: Current Outpatient Prescriptions  Medication Sig Dispense Refill  . acetaminophen (TYLENOL) 500 MG tablet Take 500 mg by mouth every 6 (six) hours as needed. Reported on 02/24/2015    . carvedilol (COREG) 25 MG tablet Take 0.5 tablets (12.5 mg total) by mouth 2 (two) times daily with a meal. 180 tablet 3  . Cholecalciferol (VITAMIN D) 2000 UNITS CAPS Take by mouth daily.      Marland Kitchen diltiazem (CARTIA XT) 180 MG 24 hr capsule Take 180 mg by mouth 2 (two) times daily.    . dorzolamide (TRUSOPT) 2 % ophthalmic solution   2  . fexofenadine (ALLEGRA) 180 MG tablet Take 180 mg by mouth.     . fluticasone (FLONASE) 50 MCG/ACT nasal spray Place 2 sprays into both nostrils daily. 48 g 3  . Glucosamine-Chondroit-Vit C-Mn (GLUCOSAMINE-CHONDROITIN) CAPS Take 1,500 mg by mouth.    . latanoprost (XALATAN) 0.005 % ophthalmic solution Place 1 drop into both eyes at bedtime.     Marland Kitchen levothyroxine (SYNTHROID) 50 MCG tablet Take 1 tablet (50 mcg total) by mouth daily. 90 tablet 3  . methocarbamol (ROBAXIN) 750 MG tablet Take 1 tablet (750 mg total) by mouth every 8 (eight) hours as needed for muscle spasms. 60 tablet 3  . Multiple Vitamin (MULTIVITAMIN) tablet Take 1 tablet by mouth daily.      . niacin 500 MG tablet Take 500 mg by mouth daily with breakfast.      . Omega-3 Fatty Acids (FISH OIL) 1200 MG CAPS Take by mouth daily.      Marland Kitchen omeprazole (PRILOSEC) 20 MG capsule Take 20 mg by mouth 2 (two) times daily before a meal.     . pioglitazone (ACTOS) 30 MG tablet Take 1 tablet (30 mg total) by mouth daily. 90 tablet 3  . pravastatin (PRAVACHOL) 40 MG tablet Take 1 tablet (40 mg total) by mouth daily. 90 tablet 3  . quinapril (ACCUPRIL) 40 MG tablet Take 1 tablet (40 mg total) by mouth daily. 90 tablet 3  . timolol (TIMOPTIC) 0.5 % ophthalmic solution   1  No current facility-administered medications for this encounter.    Physical Findings:  weight is 218 lb (98.884 kg). His blood pressure is 166/83 and his pulse is 61. His respiration is 16 and oxygen saturation is 100%. .  Pain scale 0/10  In general this is a well-appearing  male in no acute distress. He is alert and oriented 4 in appropriate the examination. Cardiovascular exam reveals a regular rate and rhythm no murmurs auscultated. Chest is clear to auscultation bilaterally. Abdomen is soft, nontender nondistended.  Lab Findings: Lab Results  Component Value Date   WBC 10.2 04/23/2014   WBC 6.4 10/07/2011   HGB 13.4 04/23/2014   HGB 14.3 10/07/2011   HCT 39.9 04/23/2014   HCT 41.8 10/07/2011   PLT 228.0 04/23/2014   PLT  202 10/07/2011    Lab Results  Component Value Date   NA 142 02/24/2015   K 4.2 02/24/2015   CO2 31 02/24/2015   GLUCOSE 116* 02/24/2015   BUN 11 02/24/2015   CREATININE 1.11 02/24/2015   BILITOT 0.5 08/25/2014   ALKPHOS 56 08/25/2014   AST 17 08/25/2014   ALT 15 08/25/2014   PROT 6.3 08/25/2014   ALBUMIN 4.1 08/25/2014   CALCIUM 9.1 02/24/2015    Radiographic Findings: No results found.  Impression:  The patient is recovering from the effects of radiation.   Plan:  Continue follow-up with Eddie Ferguson. We will be available on an as needed basis.   The above documentation reflects my direct findings during this shared patient visit. Please see the separate note by Dr. Tammi Klippel on this date for the remainder of the patient's plan of care.  Carola Rhine, PAC

## 2015-03-27 ENCOUNTER — Ambulatory Visit (INDEPENDENT_AMBULATORY_CARE_PROVIDER_SITE_OTHER): Payer: Medicare Other | Admitting: Family Medicine

## 2015-03-27 ENCOUNTER — Encounter: Payer: Self-pay | Admitting: Family Medicine

## 2015-03-27 VITALS — BP 140/50 | HR 69 | Temp 99.1°F | Wt 214.0 lb

## 2015-03-27 DIAGNOSIS — B349 Viral infection, unspecified: Secondary | ICD-10-CM | POA: Diagnosis not present

## 2015-03-27 DIAGNOSIS — J329 Chronic sinusitis, unspecified: Secondary | ICD-10-CM | POA: Diagnosis not present

## 2015-03-27 DIAGNOSIS — I10 Essential (primary) hypertension: Secondary | ICD-10-CM

## 2015-03-27 DIAGNOSIS — B9789 Other viral agents as the cause of diseases classified elsewhere: Secondary | ICD-10-CM

## 2015-03-27 MED ORDER — HYDROCHLOROTHIAZIDE 12.5 MG PO CAPS: 12.5000 mg | ORAL_CAPSULE | Freq: Every day | ORAL | Status: DC

## 2015-03-27 NOTE — Patient Instructions (Addendum)
Blood pressure still too high. Want top # under 140 as long as you are not getting lightheaded with standing.   Continue carvedilol 12.5 mg and diltiazem 180mg  in both the morning and evening.   When you start full quinapril, take it in the evening.   Take the diuretic hydrochlorothiazide in the morning  4-6 weeks. Bring your cuff with you.

## 2015-03-27 NOTE — Assessment & Plan Note (Signed)
S: poorly controlled with 11/17 home readings above 140/90 in diabetic- always systolic. Diastolic controlled sometimes into upper 50s or 60s. Range [id number] systolic. Mild poor control in office today. On carvedilol 12.5mg  BID, diltiazem 180mg  XL, quinapril 40mg  BP Readings from Last 3 Encounters:  03/27/15 140/50  03/26/15 166/83  02/24/15 150/80  A/P:Continue current medications but due to poor control add hctz 12.5mg . Still being very careful- not orthostatic now but has history of this so move slowly. Follow up 4-6 weeks and bring home cuff.

## 2015-03-27 NOTE — Patient Instructions (Signed)
Contact our office if you have any questions following today's appointment: [phone].  

## 2015-03-27 NOTE — Progress Notes (Signed)
Garret Reddish, MD  Subjective:  Eddie Ferguson is a 75 y.o. year old very pleasant male patient who presents for/with See problem oriented charting ROS- No chest pain or shortness of breath. No headache or blurry vision. No fever, chills, nausea, vomiting.   Past Medical History-  Patient Active Problem List   Diagnosis Date Noted  . Prostate cancer ([place]) 05/28/2014    Priority: High  . Diabetes mellitus type 2, controlled ([place]) 04/23/2014    Priority: High  . Essential hypertension 04/23/2014    Priority: Medium  . Hyperlipidemia 04/23/2014    Priority: Medium  . Hypothyroidism 04/23/2014    Priority: Medium  . Stomach ulcer     Priority: Medium  . Adenomatous colon polyp 05/28/2014    Priority: Low  . Osteoarthritis 05/28/2014    Priority: Low  . Erectile dysfunction 05/28/2014    Priority: Low  . Basal cell carcinoma 05/28/2014    Priority: Low  . GERD (gastroesophageal reflux disease) 04/23/2014    Priority: Low  . Allergic rhinitis 04/23/2014    Priority: Low  . Glaucoma 04/23/2014    Priority: Low    Medications- reviewed and updated Current Outpatient Prescriptions  Medication Sig Dispense Refill  . acetaminophen (TYLENOL) 500 MG tablet Take 500 mg by mouth every 6 (six) hours as needed. Reported on 02/24/2015    . carvedilol (COREG) 25 MG tablet Take 0.5 tablets (12.5 mg total) by mouth 2 (two) times daily with a meal. 180 tablet 3  . Cholecalciferol (VITAMIN D) 2000 UNITS CAPS Take by mouth daily.      Marland Kitchen diltiazem (CARTIA XT) 180 MG 24 hr capsule Take 180 mg by mouth 2 (two) times daily.    . dorzolamide (TRUSOPT) 2 % ophthalmic solution   2  . fexofenadine (ALLEGRA) 180 MG tablet Take 180 mg by mouth.    . fluticasone (FLONASE) 50 MCG/ACT nasal spray Place 2 sprays into both nostrils daily. 48 g 3  . Glucosamine-Chondroit-Vit C-Mn (GLUCOSAMINE-CHONDROITIN) CAPS Take 1,500 mg by mouth.    . latanoprost (XALATAN) 0.005 % ophthalmic solution Place 1 drop  into both eyes at bedtime.     Marland Kitchen levothyroxine (SYNTHROID) 50 MCG tablet Take 1 tablet (50 mcg total) by mouth daily. 90 tablet 3  . methocarbamol (ROBAXIN) 750 MG tablet Take 1 tablet (750 mg total) by mouth every 8 (eight) hours as needed for muscle spasms. 60 tablet 3  . Multiple Vitamin (MULTIVITAMIN) tablet Take 1 tablet by mouth daily.      . Omega-3 Fatty Acids (FISH OIL) 1200 MG CAPS Take by mouth daily.      Marland Kitchen omeprazole (PRILOSEC) 20 MG capsule Take 20 mg by mouth 2 (two) times daily before a meal.     . pioglitazone (ACTOS) 30 MG tablet Take 1 tablet (30 mg total) by mouth daily. 90 tablet 3  . pravastatin (PRAVACHOL) 40 MG tablet Take 1 tablet (40 mg total) by mouth daily. 90 tablet 3  . quinapril (ACCUPRIL) 40 MG tablet Take 1 tablet (40 mg total) by mouth daily. 90 tablet 3  . timolol (TIMOPTIC) 0.5 % ophthalmic solution   1  . hydrochlorothiazide (MICROZIDE) 12.5 MG capsule Take 1 capsule (12.5 mg total) by mouth daily. 30 capsule 5   No current facility-administered medications for this visit.    Objective: BP 140/50 mmHg  Pulse 69  Temp(Src) 99.1 F (37.3 C)  Wt 214 lb (97.07 kg) Gen: NAD, resting comfortably Frontal sinus tenderness, nares erythematous but  no obvious drainage CV: RRR no murmurs rubs or gallops Lungs: CTAB no crackles, wheeze, rhonchi Abdomen: soft/nontender/nondistended/normal bowel sounds. No rebound or guarding.  Ext: no edema Skin: warm, dry Neuro: grossly normal, moves all extremities  Assessment/Plan:  Essential hypertension S: poorly controlled with 11/17 home readings above 140/90 in diabetic- always systolic. Diastolic controlled sometimes into upper 50s or 60s. Range [id number] systolic. Mild poor control in office today. On carvedilol 12.5mg  BID, diltiazem 180mg  XL, quinapril 40mg  BP Readings from Last 3 Encounters:  03/27/15 140/50  03/26/15 166/83  02/24/15 150/80  A/P:Continue current medications but due to poor control add hctz  12.5mg . Still being very careful- not orthostatic now but has history of this so move slowly. Follow up 4-6 weeks and bring home cuff.    Viral sinusitis S: 3 days of sinus congestion and frontal sinus pressure, gradually worsening. Minimal drainage at this pont A/P: we discussed if persists to next week (Friday day 11) i would be willing to call in antibiotic for him which he has required in past. augmentin allergy- would likely use azithromycin with doxy if failed.   Return in about 4 weeks (around 04/24/2015) for follow up- or sooner if needed. Sooner Return precautions advised.   Meds ordered this encounter  Medications  . hydrochlorothiazide (MICROZIDE) 12.5 MG capsule    Sig: Take 1 capsule (12.5 mg total) by mouth daily.    Dispense:  30 capsule    Refill:  5

## 2015-03-31 ENCOUNTER — Telehealth: Payer: Self-pay | Admitting: Family Medicine

## 2015-03-31 NOTE — Telephone Encounter (Signed)
Pt was seen on 03-27-15 for sinus infection was not given abx per pt. Pt would like abx call into [facility] [address]. Pt is no better

## 2015-03-31 NOTE — Telephone Encounter (Signed)
Called and left message on pt voicemail with below information from Dr. Yong Channel.

## 2015-03-31 NOTE — Telephone Encounter (Signed)
See below, in the [place] note you said if not better by Friday you would send in Azithromycin or Doxy.

## 2015-03-31 NOTE — Telephone Encounter (Signed)
Correct- have him call Friday morning and update [place] if not at least 50% better by that time. 10 days if what we use as cut off for diagnosing bacterial infection

## 2015-04-03 ENCOUNTER — Telehealth: Payer: Self-pay | Admitting: Family Medicine

## 2015-04-03 MED ORDER — AZITHROMYCIN 250 MG PO TABS: ORAL_TABLET | ORAL | Status: DC

## 2015-04-03 NOTE — Telephone Encounter (Signed)
Pt.notified

## 2015-04-03 NOTE — Telephone Encounter (Signed)
Sent in azithromycin. Have him update [place] next Friday.

## 2015-04-03 NOTE — Telephone Encounter (Signed)
See below

## 2015-04-03 NOTE — Telephone Encounter (Signed)
Pt said he is not any better he said it has gotten worst. He is asking for a antibiotic     Pharmacy   [place]

## 2015-05-06 ENCOUNTER — Encounter: Payer: Self-pay | Admitting: Family Medicine

## 2015-05-06 ENCOUNTER — Ambulatory Visit (INDEPENDENT_AMBULATORY_CARE_PROVIDER_SITE_OTHER): Payer: Medicare Other | Admitting: Family Medicine

## 2015-05-06 VITALS — BP 124/64 | HR 60 | Temp 98.6°F | Wt 216.0 lb

## 2015-05-06 DIAGNOSIS — I10 Essential (primary) hypertension: Secondary | ICD-10-CM | POA: Diagnosis not present

## 2015-05-06 MED ORDER — HYDROCHLOROTHIAZIDE 25 MG PO TABS: 25.0000 mg | ORAL_TABLET | Freq: Every day | ORAL | Status: DC

## 2015-05-06 NOTE — Progress Notes (Signed)
Garret Reddish, MD  Subjective:  Eddie Ferguson is a 75 y.o. year old very pleasant male patient who presents for/with See problem oriented charting ROS- No chest pain or shortness of breath. No headache or blurry vision.   Past Medical History-  Patient Active Problem List   Diagnosis Date Noted  . Prostate cancer ([place]) 05/28/2014    Priority: High  . Diabetes mellitus type 2, controlled ([place]) 04/23/2014    Priority: High  . Essential hypertension 04/23/2014    Priority: Medium  . Hyperlipidemia 04/23/2014    Priority: Medium  . Hypothyroidism 04/23/2014    Priority: Medium  . Stomach ulcer     Priority: Medium  . Adenomatous colon polyp 05/28/2014    Priority: Low  . Osteoarthritis 05/28/2014    Priority: Low  . Erectile dysfunction 05/28/2014    Priority: Low  . Basal cell carcinoma 05/28/2014    Priority: Low  . GERD (gastroesophageal reflux disease) 04/23/2014    Priority: Low  . Allergic rhinitis 04/23/2014    Priority: Low  . Glaucoma 04/23/2014    Priority: Low    Medications- reviewed and updated Current Outpatient Prescriptions  Medication Sig Dispense Refill  . acetaminophen (TYLENOL) 500 MG tablet Take 500 mg by mouth every 6 (six) hours as needed. Reported on 02/24/2015    . carvedilol (COREG) 25 MG tablet Take 0.5 tablets (12.5 mg total) by mouth 2 (two) times daily with a meal. 180 tablet 3  . Cholecalciferol (VITAMIN D) 2000 UNITS CAPS Take by mouth daily.      Marland Kitchen diltiazem (CARTIA XT) 180 MG 24 hr capsule Take 180 mg by mouth 2 (two) times daily.    . dorzolamide (TRUSOPT) 2 % ophthalmic solution   2  . fexofenadine (ALLEGRA) 180 MG tablet Take 180 mg by mouth.    . fluticasone (FLONASE) 50 MCG/ACT nasal spray Place 2 sprays into both nostrils daily. 48 g 3  . Glucosamine-Chondroit-Vit C-Mn (GLUCOSAMINE-CHONDROITIN) CAPS Take 1,500 mg by mouth.    . latanoprost (XALATAN) 0.005 % ophthalmic solution Place 1 drop into both eyes at bedtime.     Marland Kitchen  levothyroxine (SYNTHROID) 50 MCG tablet Take 1 tablet (50 mcg total) by mouth daily. 90 tablet 3  . methocarbamol (ROBAXIN) 750 MG tablet Take 1 tablet (750 mg total) by mouth every 8 (eight) hours as needed for muscle spasms. 60 tablet 3  . Multiple Vitamin (MULTIVITAMIN) tablet Take 1 tablet by mouth daily.      . Omega-3 Fatty Acids (FISH OIL) 1200 MG CAPS Take by mouth daily.      Marland Kitchen omeprazole (PRILOSEC) 20 MG capsule Take 20 mg by mouth 2 (two) times daily before a meal.     . pioglitazone (ACTOS) 30 MG tablet Take 1 tablet (30 mg total) by mouth daily. 90 tablet 3  . pravastatin (PRAVACHOL) 40 MG tablet Take 1 tablet (40 mg total) by mouth daily. 90 tablet 3  . quinapril (ACCUPRIL) 40 MG tablet Take 1 tablet (40 mg total) by mouth daily. 90 tablet 3  . timolol (TIMOPTIC) 0.5 % ophthalmic solution   1  . hydrochlorothiazide (HYDRODIURIL) 12.5 MG tablet Take 1 tablet (12.5 mg total) by mouth daily. 30 tablet 5   Objective: BP 124/64 mmHg  Pulse 60  Temp(Src) 98.6 F (37 C)  Wt 216 lb (97.977 kg) Gen: NAD, resting comfortably CV: RRR no murmurs rubs or gallops Lungs: CTAB no crackles, wheeze, rhonchi Abdomen: soft/nontender/nondistended/normal bowel sounds.  Ext: no edema  Skin: warm, dry Neuro: grossly normal, moves all extremities  Assessment/Plan:  Essential hypertension S: controlled on initial reading on carvedilol 12.5mg  BID, diltiazem 180mg  XL, quinapril 40mg , hctz 12.5mg . Home readings ony 4/17 below 140/90 recently though diastolic always controlled. Readings overall much closer to 140 though with less variance into 160 range.  Recheck shows: 163/80 arm home cuff 133/94 wrist cuff 154/78 office reading BP Readings from Last 3 Encounters:  05/06/15 124/64  03/27/15 140/50  03/26/15 166/83  A/P:Continue current meds: but increase to 25mg  hctz. Home arm cuff seems most accurate so use that   Return in about 6 weeks (around 06/17/2015). see yellow note.  Return  precautions advised.   Meds ordered this encounter  Medications  . hydrochlorothiazide (HYDRODIURIL) 25 MG tablet    Sig: Take 1 tablet (25 mg total) by mouth daily.    Dispense:  30 tablet    Refill:  5

## 2015-05-06 NOTE — Assessment & Plan Note (Signed)
S: controlled on initial reading on carvedilol 12.5mg  BID, diltiazem 180mg  XL, quinapril 40mg , hctz 12.5mg . Home readings ony 4/17 below 140/90 recently though diastolic always controlled. Readings overall much closer to 140 though with less variance into 160 range.  Recheck shows: 163/80 arm home cuff 133/94 wrist cuff 154/78 office reading BP Readings from Last 3 Encounters:  05/06/15 124/64  03/27/15 140/50  03/26/15 166/83  A/P:Continue current meds: but increase to 25mg  hctz. Home arm cuff seems most accurate so use that

## 2015-05-06 NOTE — Patient Instructions (Addendum)
Increase hydrochlorothiazide to 25mg . Continue carvedilol, diltiazem and quinapril.   Blood pressure in office initially looked ok but high on repeat. Home cuffs seem to be off slightly. Let's have you continue the arm cuff only and check daily- bring cuff with you to follow up again- we will continue to follow up our readings vs. Your cuff  Let's do the longer test I discussed at follow up

## 2015-05-20 LAB — PSA: PSA: 1.78

## 2015-06-05 ENCOUNTER — Other Ambulatory Visit: Payer: Self-pay | Admitting: Family Medicine

## 2015-06-05 MED ORDER — FLUTICASONE PROPIONATE 50 MCG/ACT NA SUSP: 2.0000 | Freq: Every day | NASAL | Status: DC

## 2015-06-16 ENCOUNTER — Ambulatory Visit (INDEPENDENT_AMBULATORY_CARE_PROVIDER_SITE_OTHER): Payer: Medicare Other | Admitting: Family Medicine

## 2015-06-16 ENCOUNTER — Encounter: Payer: Self-pay | Admitting: Family Medicine

## 2015-06-16 VITALS — BP 140/70 | HR 74 | Temp 98.7°F | Wt 217.0 lb

## 2015-06-16 DIAGNOSIS — F03A0 Unspecified dementia, mild, without behavioral disturbance, psychotic disturbance, mood disturbance, and anxiety: Secondary | ICD-10-CM | POA: Insufficient documentation

## 2015-06-16 DIAGNOSIS — I10 Essential (primary) hypertension: Secondary | ICD-10-CM

## 2015-06-16 DIAGNOSIS — R413 Other amnesia: Secondary | ICD-10-CM

## 2015-06-16 DIAGNOSIS — F0390 Unspecified dementia without behavioral disturbance: Secondary | ICD-10-CM | POA: Insufficient documentation

## 2015-06-16 DIAGNOSIS — F0280 Dementia in other diseases classified elsewhere without behavioral disturbance: Secondary | ICD-10-CM | POA: Insufficient documentation

## 2015-06-16 DIAGNOSIS — G3184 Mild cognitive impairment, so stated: Secondary | ICD-10-CM | POA: Insufficient documentation

## 2015-06-16 DIAGNOSIS — F03B0 Unspecified dementia, moderate, without behavioral disturbance, psychotic disturbance, mood disturbance, and anxiety: Secondary | ICD-10-CM | POA: Insufficient documentation

## 2015-06-16 MED ORDER — CHLORTHALIDONE 25 MG PO TABS: 25.0000 mg | ORAL_TABLET | Freq: Every day | ORAL | Status: DC

## 2015-06-16 MED ORDER — GLUCOSE BLOOD VI STRP: ORAL_STRIP | Status: DC

## 2015-06-16 NOTE — Patient Instructions (Addendum)
Stop the hydrochlorothiazide. Replace it with chlorthalidone 25mg . Follow up in 6 weeks. Though these medicines are similar- the new one tends to work slightly better.   Let's repeat the memory test in 6-12 months. I think entering into the study is reasonable  Health Maintenance Due  Topic Date Due  . FOOT EXAM  04/24/2015  lets try to do this at follow up   [place] stands for "Dietary Approaches to Stop Hypertension." The DASH eating plan is a healthy eating plan that has been shown to reduce high blood pressure (hypertension). Additional health benefits may include reducing the risk of type 2 diabetes mellitus, heart disease, and stroke. The DASH eating plan may also help with weight loss. WHAT DO I NEED TO KNOW ABOUT THE DASH EATING PLAN? For the DASH eating plan, you will follow these general guidelines:  Choose foods with a percent daily value for sodium of less than 5% (as listed on the food label).  Use salt-free seasonings or herbs instead of table salt or sea salt.  Check with your health care provider or pharmacist before using salt substitutes.  Eat lower-sodium products, often labeled as "lower sodium" or "no salt added."  Eat fresh foods.  Eat more vegetables, fruits, and low-fat dairy products.  Choose whole grains. Look for the word "whole" as the first word in the ingredient list.  Choose fish and skinless chicken or [place] more often than red meat. Limit fish, poultry, and meat to 6 oz (170 g) each day.  Limit sweets, desserts, sugars, and sugary drinks.  Choose heart-healthy fats.  Limit cheese to 1 oz (28 g) per day.  Eat more home-cooked food and less restaurant, buffet, and fast food.  Limit fried foods.  Cook foods using methods other than frying.  Limit canned vegetables. If you do use them, rinse them well to decrease the sodium.  When eating at a restaurant, ask that your food be prepared with less salt, or no salt if  possible. WHAT FOODS CAN I EAT? Seek help from a dietitian for individual calorie needs. Grains Whole grain or whole wheat bread. Brown rice. Whole grain or whole wheat pasta. Quinoa, bulgur, and whole grain cereals. Low-sodium cereals. Corn or whole wheat flour tortillas. Whole grain cornbread. Whole grain crackers. Low-sodium crackers. Vegetables Fresh or frozen vegetables (raw, steamed, roasted, or grilled). Low-sodium or reduced-sodium tomato and vegetable juices. Low-sodium or reduced-sodium tomato sauce and paste. Low-sodium or reduced-sodium canned vegetables.  Fruits All fresh, canned (in natural juice), or frozen fruits. Meat and Other Protein Products Ground beef (85% or leaner), grass-fed beef, or beef trimmed of fat. Skinless chicken or [place]. Ground chicken or [place]. Pork trimmed of fat. All fish and seafood. Eggs. Dried beans, peas, or lentils. Unsalted nuts and seeds. Unsalted canned beans. Dairy Low-fat dairy products, such as skim or 1% milk, 2% or reduced-fat cheeses, low-fat ricotta or cottage cheese, or plain low-fat yogurt. Low-sodium or reduced-sodium cheeses. Fats and Oils Tub margarines without trans fats. Light or reduced-fat mayonnaise and salad dressings (reduced sodium). Avocado. Safflower, olive, or canola oils. Natural peanut or almond butter. Other Unsalted popcorn and pretzels. The items listed above may not be a complete list of recommended foods or beverages. Contact your dietitian for more options. WHAT FOODS ARE NOT RECOMMENDED? Grains White bread. White pasta. White rice. Refined cornbread. Bagels and croissants. Crackers that contain trans fat. Vegetables Creamed or fried vegetables. Vegetables in a cheese sauce. Regular canned vegetables. Regular canned  tomato sauce and paste. Regular tomato and vegetable juices. Fruits Dried fruits. Canned fruit in light or heavy syrup. Fruit juice. Meat and Other Protein Products Fatty cuts of meat. Ribs, chicken  wings, bacon, sausage, bologna, salami, chitterlings, fatback, hot dogs, bratwurst, and packaged luncheon meats. Salted nuts and seeds. Canned beans with salt. Dairy Whole or 2% milk, cream, half-and-half, and cream cheese. Whole-fat or sweetened yogurt. Full-fat cheeses or blue cheese. Nondairy creamers and whipped toppings. Processed cheese, cheese spreads, or cheese curds. Condiments Onion and garlic salt, seasoned salt, table salt, and sea salt. Canned and packaged gravies. Worcestershire sauce. Tartar sauce. Barbecue sauce. Teriyaki sauce. Soy sauce, including reduced sodium. Steak sauce. Fish sauce. Oyster sauce. Cocktail sauce. Horseradish. Ketchup and mustard. Meat flavorings and tenderizers. Bouillon cubes. Hot sauce. Tabasco sauce. Marinades. Taco seasonings. Relishes. Fats and Oils Butter, stick margarine, lard, shortening, ghee, and bacon fat. Coconut, palm kernel, or palm oils. Regular salad dressings. Other Pickles and olives. Salted popcorn and pretzels. The items listed above may not be a complete list of foods and beverages to avoid. Contact your dietitian for more information. WHERE CAN I FIND MORE INFORMATION? National Heart, Lung, and Blood Institute: travelstabloid.com   This information is not intended to replace advice given to you by your health care provider. Make sure you discuss any questions you have with your health care provider.   Document Released: 02/10/2011 Document Revised: 03/14/2014 Document Reviewed: 12/26/2012 Elsevier Interactive Patient Education [employer].

## 2015-06-16 NOTE — Assessment & Plan Note (Signed)
S: has noted himself witting more notes this year as otherwise he forgets.  A/P: MMSE 28/30 with 2 losses for 1/3 three word recall. Interested in a study at [place] I believe for memory loss- he is going to submit for this. We will repeat MMSE in 6-12 months.

## 2015-06-16 NOTE — Addendum Note (Signed)
Addended by: Clyde Lundborg A on: 06/16/2015 09:47 AM   Modules accepted: Orders

## 2015-06-16 NOTE — Assessment & Plan Note (Signed)
S: controlled on carvedilol 12.5mg , diltiazem 180mg  XL, quinapril 40mg , hctz 25mg .  Prior home readings only 4 of 17 less than 140/90.  BP Readings from Last 3 Encounters:  06/16/15 140/70  05/06/15 124/64  03/27/15 140/50  A/P:Continue current meds:  Except change hctz 25mg  to chlorthalidone 25mg . Home cuff measures 139/67 so very reasonably close. Also give dash handout

## 2015-06-16 NOTE — Progress Notes (Signed)
Garret Reddish, MD  Subjective:  Eddie Ferguson is a 75 y.o. year old very pleasant male patient who presents for/with See problem oriented charting ROS- No chest pain or shortness of breath. No headache or blurry vision.   Past Medical History-  Patient Active Problem List   Diagnosis Date Noted  . Prostate cancer ([place]) 05/28/2014    Priority: High  . Diabetes mellitus type 2, controlled ([place]) 04/23/2014    Priority: High  . Short-term memory loss 06/16/2015    Priority: Medium  . Essential hypertension 04/23/2014    Priority: Medium  . Hyperlipidemia 04/23/2014    Priority: Medium  . Hypothyroidism 04/23/2014    Priority: Medium  . Stomach ulcer     Priority: Medium  . Adenomatous colon polyp 05/28/2014    Priority: Low  . Osteoarthritis 05/28/2014    Priority: Low  . Erectile dysfunction 05/28/2014    Priority: Low  . Basal cell carcinoma 05/28/2014    Priority: Low  . GERD (gastroesophageal reflux disease) 04/23/2014    Priority: Low  . Allergic rhinitis 04/23/2014    Priority: Low  . Glaucoma 04/23/2014    Priority: Low    Medications- reviewed and updated Current Outpatient Prescriptions  Medication Sig Dispense Refill  . carvedilol (COREG) 25 MG tablet Take 0.5 tablets (12.5 mg total) by mouth 2 (two) times daily with a meal. 180 tablet 3  . Cholecalciferol (VITAMIN D) 2000 UNITS CAPS Take by mouth daily.      Marland Kitchen diltiazem (CARTIA XT) 180 MG 24 hr capsule Take 180 mg by mouth 2 (two) times daily.    . dorzolamide (TRUSOPT) 2 % ophthalmic solution   2  . fexofenadine (ALLEGRA) 180 MG tablet Take 180 mg by mouth.    . fluticasone (FLONASE) 50 MCG/ACT nasal spray Place 2 sprays into both nostrils daily. 48 g 3  . Glucosamine-Chondroit-Vit C-Mn (GLUCOSAMINE-CHONDROITIN) CAPS Take 1,500 mg by mouth.    . latanoprost (XALATAN) 0.005 % ophthalmic solution Place 1 drop into both eyes at bedtime.     Marland Kitchen levothyroxine (SYNTHROID) 50 MCG tablet Take 1 tablet (50 mcg  total) by mouth daily. 90 tablet 3  . Multiple Vitamin (MULTIVITAMIN) tablet Take 1 tablet by mouth daily.      . Omega-3 Fatty Acids (FISH OIL) 1200 MG CAPS Take by mouth daily.      Marland Kitchen omeprazole (PRILOSEC) 20 MG capsule Take 20 mg by mouth 2 (two) times daily before a meal.     . pioglitazone (ACTOS) 30 MG tablet Take 1 tablet (30 mg total) by mouth daily. 90 tablet 3  . pravastatin (PRAVACHOL) 40 MG tablet Take 1 tablet (40 mg total) by mouth daily. 90 tablet 3  . quinapril (ACCUPRIL) 40 MG tablet Take 1 tablet (40 mg total) by mouth daily. 90 tablet 3  . timolol (TIMOPTIC) 0.5 % ophthalmic solution   1  . acetaminophen (TYLENOL) 500 MG tablet Take 500 mg by mouth every 6 (six) hours as needed. Reported on 06/16/2015    . chlorthalidone (HYGROTON) 25 MG tablet Take 1 tablet (25 mg total) by mouth daily. 90 tablet 3  . methocarbamol (ROBAXIN) 750 MG tablet Take 1 tablet (750 mg total) by mouth every 8 (eight) hours as needed for muscle spasms. (Patient not taking: Reported on 06/16/2015) 60 tablet 3   No current facility-administered medications for this visit.    Objective: BP 140/70 mmHg  Pulse 74  Temp(Src) 98.7 F (37.1 C)  Wt 217 lb (  98.431 kg) Gen: NAD, resting comfortably CV: RRR no murmurs rubs or gallops Lungs: CTAB no crackles, wheeze, rhonchi Abdomen: soft/nontender/nondistended/normal bowel sounds. No rebound or guarding.  Ext: no edema Skin: warm, dry Neuro: grossly normal, moves all extremities  Home cuff 139/67  Assessment/Plan:  Essential hypertension S: controlled on carvedilol 12.5mg , diltiazem 180mg  XL, quinapril 40mg , hctz 25mg .  Prior home readings only 4 of 17 less than 140/90.  BP Readings from Last 3 Encounters:  06/16/15 140/70  05/06/15 124/64  03/27/15 140/50  A/P:Continue current meds:  Except change hctz 25mg  to chlorthalidone 25mg . Home cuff measures 139/67 so very reasonably close. Also give dash handout  Short-term memory loss S: has noted  himself witting more notes this year as otherwise he forgets.  A/P: MMSE 28/30 with 2 losses for 1/3 three word recall. Interested in a study at [place] I believe for memory loss- he is going to submit for this. We will repeat MMSE in 6-12 months.    Return in about 6 weeks (around 07/28/2015) for follow up- or sooner if needed. Return precautions advised.   Meds ordered this encounter  Medications  . chlorthalidone (HYGROTON) 25 MG tablet    Sig: Take 1 tablet (25 mg total) by mouth daily.    Dispense:  90 tablet    Refill:  3

## 2015-06-18 ENCOUNTER — Ambulatory Visit (INDEPENDENT_AMBULATORY_CARE_PROVIDER_SITE_OTHER): Payer: Medicare Other

## 2015-06-18 ENCOUNTER — Ambulatory Visit (HOSPITAL_COMMUNITY)
Admission: EM | Admit: 2015-06-18 | Discharge: 2015-06-18 | Disposition: A | Discharge status: Home / Self Care | Payer: Medicare Other | Attending: Family Medicine | Admitting: Family Medicine

## 2015-06-18 ENCOUNTER — Encounter (HOSPITAL_COMMUNITY): Payer: Self-pay | Admitting: *Deleted

## 2015-06-18 DIAGNOSIS — J302 Other seasonal allergic rhinitis: Secondary | ICD-10-CM | POA: Diagnosis not present

## 2015-06-18 MED ORDER — BUDESONIDE 32 MCG/ACT NA SUSP: 1.0000 | Freq: Two times a day (BID) | NASAL | Status: DC

## 2015-06-18 MED ORDER — AZITHROMYCIN 250 MG PO TABS: ORAL_TABLET | ORAL | Status: DC

## 2015-06-18 NOTE — ED Provider Notes (Signed)
CSN: [id number]     Arrival date & time 06/18/15  1602 History   First MD Initiated Contact with Patient 06/18/15 1720     Chief Complaint  Patient presents with  . URI   (Consider location/radiation/quality/duration/timing/severity/associated sxs/prior Treatment) Patient is a 75 y.o. male presenting with URI. The history is provided by the patient.  URI Presenting symptoms: congestion, facial pain and rhinorrhea   Presenting symptoms: no fever and no sore throat   Severity:  Mild Onset quality:  Gradual Duration:  2 days Progression:  Unchanged Chronicity:  New Relieved by:  None tried Worsened by:  Nothing tried Ineffective treatments:  None tried Associated symptoms: sinus pain   Associated symptoms: no sneezing and no wheezing     Past Medical History  Diagnosis Date  . Diabetes mellitus   . Hypertension   . Hyperlipidemia   . Hypothyroidism   . Seasonal allergies   . Glaucoma   . Arthritis   . Blood transfusion     2010 because of stomach ulcers  . Stomach ulcer 2010    bleeding ulcer result NSAIDS  . Basal cell carcinoma 05/28/2014    L nasal tip. Mohs Dr. Link Snuffer.    . Sinusitis     treated for bacterial infection at least once a year at [facility]  . Prostate cancer [facility])    Past Surgical History  Procedure Laterality Date  . Lumbar [place]aminectomy  2007    [place]  . Reconstruction tendon pulley w/ tendon / fascial graft of hand / finger  1982    rt 3rd finger  . Reconstruction tendon pulley w/ tendon / fascial graft of hand / finger  1953    rt 5th finger  . Colonoscopy      2012, 5 year repeat  . Esophagogastroduodenoscopy  2010    PUD  . Back surgery     Family History  Problem Relation Age of Onset  . Colon cancer Neg Hx   . Esophageal cancer Neg Hx   . Stomach cancer Neg Hx   . Diabetes Mother   . Diabetes Father   . Hyperlipidemia Father   . Brain cancer Brother     smoker   Social History  Substance Use Topics  . Smoking status: Former  Smoker    Quit date: 02/03/2001  . Smokeless tobacco: Never Used     Comment: occasoinal cigars previously  . Alcohol Use: Yes     Comment: occasional    Review of Systems  Constitutional: Negative.  Negative for fever and chills.  HENT: Positive for congestion, postnasal drip, rhinorrhea and sinus pressure. Negative for facial swelling, sneezing and sore throat.   Respiratory: Negative.  Negative for wheezing.   Cardiovascular: Negative.   All other systems reviewed and are negative.   Allergies  Aspirin; Nsaids; Amoxicillin-pot clavulanate; and Sulfa antibiotics  Home Medications   Prior to Admission medications   Medication Sig Start Date End Date Taking? Authorizing Provider  acetaminophen (TYLENOL) 500 MG tablet Take 500 mg by mouth every 6 (six) hours as needed. Reported on 06/16/2015    Historical Provider, MD  azithromycin (ZITHROMAX Z-PAK) 250 MG tablet Take as directed on pack 06/18/15   Billy Fischer, MD  budesonide (RHINOCORT AQUA) 32 MCG/ACT nasal spray Place 1 spray into both nostrils 2 (two) times daily. One spray each nostril bid 06/18/15   Billy Fischer, MD  carvedilol (COREG) 25 MG tablet Take 0.5 tablets (12.5 mg total) by mouth 2 (two)  times daily with a meal. 02/24/15   Marin Olp, MD  chlorthalidone (HYGROTON) 25 MG tablet Take 1 tablet (25 mg total) by mouth daily. 06/16/15   Marin Olp, MD  Cholecalciferol (VITAMIN D) 2000 UNITS CAPS Take by mouth daily.      Historical Provider, MD  diltiazem (CARTIA XT) 180 MG 24 hr capsule Take 180 mg by mouth 2 (two) times daily.    Historical Provider, MD  dorzolamide (TRUSOPT) 2 % ophthalmic solution  06/21/14   Historical Provider, MD  fexofenadine (ALLEGRA) 180 MG tablet Take 180 mg by mouth.    Historical Provider, MD  fluticasone (FLONASE) 50 MCG/ACT nasal spray Place 2 sprays into both nostrils daily. 06/05/15   Marin Olp, MD  Glucosamine-Chondroit-Vit C-Mn (GLUCOSAMINE-CHONDROITIN) CAPS Take 1,500 mg  by mouth.    Historical Provider, MD  glucose blood (FREESTYLE LITE) test strip Use to check blood sugars daily. Dx: E11.9 06/16/15   Marin Olp, MD  latanoprost (XALATAN) 0.005 % ophthalmic solution Place 1 drop into both eyes at bedtime.  01/21/11   Historical Provider, MD  levothyroxine (SYNTHROID) 50 MCG tablet Take 1 tablet (50 mcg total) by mouth daily. 02/24/15   Marin Olp, MD  methocarbamol (ROBAXIN) 750 MG tablet Take 1 tablet (750 mg total) by mouth every 8 (eight) hours as needed for muscle spasms. Patient not taking: Reported on 06/16/2015 08/25/14   Marin Olp, MD  Multiple Vitamin (MULTIVITAMIN) tablet Take 1 tablet by mouth daily.      Historical Provider, MD  Omega-3 Fatty Acids (FISH OIL) 1200 MG CAPS Take by mouth daily.      Historical Provider, MD  omeprazole (PRILOSEC) 20 MG capsule Take 20 mg by mouth 2 (two) times daily before a meal.     Historical Provider, MD  pioglitazone (ACTOS) 30 MG tablet Take 1 tablet (30 mg total) by mouth daily. 02/24/15   Marin Olp, MD  pravastatin (PRAVACHOL) 40 MG tablet Take 1 tablet (40 mg total) by mouth daily. 02/24/15   Marin Olp, MD  quinapril (ACCUPRIL) 40 MG tablet Take 1 tablet (40 mg total) by mouth daily. 02/24/15   Marin Olp, MD  timolol (TIMOPTIC) 0.5 % ophthalmic solution  10/20/14   Historical Provider, MD   Meds Ordered and Administered this Visit  Medications - No data to display  BP 162/70 mmHg  Pulse 55  Temp(Src) 98.7 F (37.1 C) (Oral)  Resp 16  SpO2 97% No data found.   Physical Exam  Constitutional: He is oriented to person, place, and time. He appears well-developed and well-nourished. No distress.  HENT:  Right Ear: External ear normal.  Left Ear: External ear normal.  Nose: Nose normal.  Mouth/Throat: Oropharynx is clear and moist.  Neck: Normal range of motion. Neck supple.  Cardiovascular: Normal rate, regular rhythm, normal heart sounds and intact distal pulses.    Pulmonary/Chest: Effort normal and breath sounds normal.  Lymphadenopathy:    He has no cervical adenopathy.  Neurological: He is alert and oriented to person, place, and time.  Skin: Skin is warm and dry.  Nursing note and vitals reviewed.   ED Course  Procedures (including critical care time)  Labs Review Labs Reviewed - No data to display  Imaging Review Dg Sinuses Complete  06/18/2015  CLINICAL DATA:  Forehead pain EXAM: PARANASAL SINUSES - COMPLETE 3 + VIEW COMPARISON:  01/13/2012 FINDINGS: Hypoplastic/aplastic appearance of the left frontal sinus. Given stable finding and  lack of sclerotic feature an obstructing osteoma is considered much less likely. No fluid level or asymmetric opacification is seen. Negative orbits. IMPRESSION: No visible sinusitis. Electronically Signed   By: Monte Fantasia M.D.   On: 06/18/2015 18:07   X-rays reviewed and report per radiologist.   Visual Acuity Review  Right Eye Distance:   Left Eye Distance:   Bilateral Distance:    Right Eye Near:   Left Eye Near:    Bilateral Near:         MDM   1. Seasonal allergic rhinitis        Billy Fischer, MD 06/18/15 1825

## 2015-06-18 NOTE — ED Notes (Signed)
Pt  Reports  Symptoms     Not    Used       -        approx       Symptoms        Began  Several days  Ago         -   Pt        Was  Seen  Several    Days  Ago     Pt   Reports  meds  Not  releived  By  otc  meds      Such  As  Tylenol   /  Allegra

## 2015-06-20 ENCOUNTER — Encounter (HOSPITAL_COMMUNITY): Payer: Self-pay | Admitting: *Deleted

## 2015-06-20 ENCOUNTER — Ambulatory Visit (HOSPITAL_COMMUNITY)
Admission: EM | Admit: 2015-06-20 | Discharge: 2015-06-20 | Disposition: A | Discharge status: Home / Self Care | Payer: Medicare Other | Attending: Emergency Medicine | Admitting: Emergency Medicine

## 2015-06-20 DIAGNOSIS — M10072 Idiopathic gout, left ankle and foot: Secondary | ICD-10-CM | POA: Diagnosis not present

## 2015-06-20 DIAGNOSIS — M109 Gout, unspecified: Secondary | ICD-10-CM

## 2015-06-20 MED ORDER — PREDNISONE 50 MG PO TABS: 50.0000 mg | ORAL_TABLET | Freq: Every day | ORAL | Status: DC

## 2015-06-20 NOTE — ED Provider Notes (Signed)
CSN: [id number]     Arrival date & time 06/20/15  1307 History   First MD Initiated Contact with Patient 06/20/15 1425     Chief Complaint  Patient presents with  . Foot Pain   (Consider location/radiation/quality/duration/timing/severity/associated sxs/prior Treatment) HPI History obtained from patient: Location:left foot Context/Duration: awoke 2 days ago with lateral foot pain, no injury Severity:4  Quality:like other gout flares Timing:         constant   Home Treatment: tylenol Associated symptoms:  Painful to walk  Past Medical History  Diagnosis Date  . Diabetes mellitus   . Hypertension   . Hyperlipidemia   . Hypothyroidism   . Seasonal allergies   . Glaucoma   . Arthritis   . Blood transfusion     2010 because of stomach ulcers  . Stomach ulcer 2010    bleeding ulcer result NSAIDS  . Basal cell carcinoma 05/28/2014    L nasal tip. Mohs Dr. Link Snuffer.    . Sinusitis     treated for bacterial infection at least once a year at [facility]  . Prostate cancer [facility])    Past Surgical History  Procedure Laterality Date  . Lumbar laminectomy  2007    [place]  . Reconstruction tendon pulley w/ tendon / fascial graft of hand / finger  1982    rt 3rd finger  . Reconstruction tendon pulley w/ tendon / fascial graft of hand / finger  1953    rt 5th finger  . Colonoscopy      2012, 5 year repeat  . Esophagogastroduodenoscopy  2010    PUD  . Back surgery     Family History  Problem Relation Age of Onset  . Colon cancer Neg Hx   . Esophageal cancer Neg Hx   . Stomach cancer Neg Hx   . Diabetes Mother   . Diabetes Father   . Hyperlipidemia Father   . Brain cancer Brother     smoker   Social History  Substance Use Topics  . Smoking status: Former Smoker    Quit date: 02/03/2001  . Smokeless tobacco: Never Used     Comment: occasoinal cigars previously  . Alcohol Use: Yes     Comment: occasional    Review of Systems Left foot pain Allergies  Aspirin; Nsaids;  Amoxicillin-pot clavulanate; and Sulfa antibiotics  Home Medications   Prior to Admission medications   Medication Sig Start Date End Date Taking? Authorizing Provider  acetaminophen (TYLENOL) 500 MG tablet Take 500 mg by mouth every 6 (six) hours as needed. Reported on 06/16/2015    Historical Provider, MD  azithromycin (ZITHROMAX Z-PAK) 250 MG tablet Take as directed on pack 06/18/15   Billy Fischer, MD  budesonide (RHINOCORT AQUA) 32 MCG/ACT nasal spray Place 1 spray into both nostrils 2 (two) times daily. One spray each nostril bid 06/18/15   Billy Fischer, MD  carvedilol (COREG) 25 MG tablet Take 0.5 tablets (12.5 mg total) by mouth 2 (two) times daily with a meal. 02/24/15   Marin Olp, MD  chlorthalidone (HYGROTON) 25 MG tablet Take 1 tablet (25 mg total) by mouth daily. 06/16/15   Marin Olp, MD  Cholecalciferol (VITAMIN D) 2000 UNITS CAPS Take by mouth daily.      Historical Provider, MD  diltiazem (CARTIA XT) 180 MG 24 hr capsule Take 180 mg by mouth 2 (two) times daily.    Historical Provider, MD  dorzolamide (TRUSOPT) 2 % ophthalmic solution  06/21/14  Historical Provider, MD  fexofenadine (ALLEGRA) 180 MG tablet Take 180 mg by mouth.    Historical Provider, MD  fluticasone (FLONASE) 50 MCG/ACT nasal spray Place 2 sprays into both nostrils daily. 06/05/15   Marin Olp, MD  Glucosamine-Chondroit-Vit C-Mn (GLUCOSAMINE-CHONDROITIN) CAPS Take 1,500 mg by mouth.    Historical Provider, MD  glucose blood (FREESTYLE LITE) test strip Use to check blood sugars daily. Dx: E11.9 06/16/15   Marin Olp, MD  latanoprost (XALATAN) 0.005 % ophthalmic solution Place 1 drop into both eyes at bedtime.  01/21/11   Historical Provider, MD  levothyroxine (SYNTHROID) 50 MCG tablet Take 1 tablet (50 mcg total) by mouth daily. 02/24/15   Marin Olp, MD  methocarbamol (ROBAXIN) 750 MG tablet Take 1 tablet (750 mg total) by mouth every 8 (eight) hours as needed for muscle  spasms. Patient not taking: Reported on 06/16/2015 08/25/14   Marin Olp, MD  Multiple Vitamin (MULTIVITAMIN) tablet Take 1 tablet by mouth daily.      Historical Provider, MD  Omega-3 Fatty Acids (FISH OIL) 1200 MG CAPS Take by mouth daily.      Historical Provider, MD  omeprazole (PRILOSEC) 20 MG capsule Take 20 mg by mouth 2 (two) times daily before a meal.     Historical Provider, MD  pioglitazone (ACTOS) 30 MG tablet Take 1 tablet (30 mg total) by mouth daily. 02/24/15   Marin Olp, MD  pravastatin (PRAVACHOL) 40 MG tablet Take 1 tablet (40 mg total) by mouth daily. 02/24/15   Marin Olp, MD  predniSONE (DELTASONE) 50 MG tablet Take 1 tablet (50 mg total) by mouth daily. 06/20/15   Konrad Felix, PA  quinapril (ACCUPRIL) 40 MG tablet Take 1 tablet (40 mg total) by mouth daily. 02/24/15   Marin Olp, MD  timolol (TIMOPTIC) 0.5 % ophthalmic solution  10/20/14   Historical Provider, MD   Meds Ordered and Administered this Visit  Medications - No data to display  BP 136/81 mmHg  Pulse 58  Temp(Src) 97.9 F (36.6 C) (Oral)  SpO2 97% No data found.   Physical Exam NURSES NOTES AND VITAL SIGNS REVIEWED. CONSTITUTIONAL: Well developed, well nourished, no acute distress HEENT: normocephalic, atraumatic EYES: Conjunctiva normal NECK:normal ROM, supple, no adenopathy PULMONARY:No respiratory distress, normal effort MUSCULOSKELETAL: Normal ROM of all extremities, Left lateral foot , mild redness, warm to touch, tenderness. No visible deformity, no ankle or great toe tenderness.  SKIN: warm and dry without rash PSYCHIATRIC: Mood and affect, behavior are normal  ED Course  Procedures (including critical care time)  Labs Review Labs Reviewed - No data to display  Imaging Review Dg Sinuses Complete  06/18/2015  CLINICAL DATA:  Forehead pain EXAM: PARANASAL SINUSES - COMPLETE 3 + VIEW COMPARISON:  01/13/2012 FINDINGS: Hypoplastic/aplastic appearance of the left  frontal sinus. Given stable finding and lack of sclerotic feature an obstructing osteoma is considered much less likely. No fluid level or asymmetric opacification is seen. Negative orbits. IMPRESSION: No visible sinusitis. Electronically Signed   By: Monte Fantasia M.D.   On: 06/18/2015 18:07     Visual Acuity Review  Right Eye Distance:   Left Eye Distance:   Bilateral Distance:    Right Eye Near:   Left Eye Near:    Bilateral Near:     Declines XR, states no injury  rx prednisone   MDM   1. Acute gout of left foot, unspecified cause     Patient is reassured  that there are no issues that require transfer to higher level of care at this time or additional tests. Patient is advised to continue home symptomatic treatment. Patient is advised that if there are new or worsening symptoms to attend the emergency department, contact primary care provider, or return to [facility]. Instructions of care provided discharged home in stable condition.    THIS NOTE WAS GENERATED USING A VOICE RECOGNITION SOFTWARE PROGRAM. ALL REASONABLE EFFORTS  WERE MADE TO PROOFREAD THIS DOCUMENT FOR ACCURACY.  I have verbally reviewed the discharge instructions with the patient. A printed AVS was given to the patient.  All questions were answered prior to discharge.      Konrad Felix, [place] 06/20/15 1432

## 2015-06-20 NOTE — ED Notes (Signed)
Pt  Reports   Symptoms symptoms  Of pain  In  His  l  Foot   tahd veloped        Several  Days  Ago      he  Was  Seen recently  For  Sinus  Allergy  Issues   Which  He  Reports  The  Symptoms  Are  Better

## 2015-06-20 NOTE — Discharge Instructions (Signed)
Gout  Gout is when your joints become red, sore, and swell (inflamed). This is caused by the buildup of uric acid crystals in the joints. Uric acid is a chemical that is normally in the blood. If the level of uric acid gets too high in the blood, these crystals form in your joints and tissues. Over time, these crystals can form into masses near the joints and tissues. These masses can destroy bone and cause the bone to look misshapen (deformed).  HOME CARE    Do not take aspirin for pain.   Only take medicine as told by your doctor.   Rest the joint as much as you can. When in bed, keep sheets and blankets off painful areas.   Keep the sore joints raised (elevated).   Put warm or cold packs on painful joints. Use of warm or cold packs depends on which works best for you.   Use crutches if the painful joint is in your leg.   Drink enough fluids to keep your pee (urine) clear or pale yellow. Limit alcohol, sugary drinks, and drinks with fructose in them.   Follow your diet instructions. Pay careful attention to how much protein you eat. Include fruits, vegetables, whole grains, and fat-free or low-fat milk products in your daily diet. Talk to your doctor or dietitian about the use of coffee, vitamin C, and cherries. These may help lower uric acid levels.   Keep a healthy body weight.  GET HELP RIGHT AWAY IF:    You have watery poop (diarrhea), throw up (vomit), or have any side effects from medicines.   You do not feel better in 24 hours, or you are getting worse.   Your joint becomes suddenly more tender, and you have chills or a fever.  MAKE SURE YOU:    Understand these instructions.   Will watch your condition.   Will get help right away if you are not doing well or get worse.     This information is not intended to replace advice given to you by your health care provider. Make sure you discuss any questions you have with your health care provider.     Document Released: 12/01/2007 Document Revised:  03/14/2014 Document Reviewed: 10/05/2011  Elsevier Interactive Patient Education 2016 [employer]  Low-Purine Diet  Purines are compounds that affect the level of uric acid in your body. A low-purine diet is a diet that is low in purines. Eating a low-purine diet can prevent the level of uric acid in your body from getting too high and causing gout or kidney stones or both.  WHAT DO I NEED TO KNOW ABOUT THIS DIET?   Choose low-purine foods. Examples of low-purine foods are listed in the next section.   Drink plenty of fluids, especially water. Fluids can help remove uric acid from your body. Try to drink 8-16 cups (1.9-3.8 L) a day.   Limit foods high in fat, especially saturated fat, as fat makes it harder for the body to get rid of uric acid. Foods high in saturated fat include pizza, cheese, ice cream, whole milk, fried foods, and gravies. Choose foods that are lower in fat and lean sources of protein. Use olive oil when cooking as it contains healthy fats that are not high in saturated fat.   Limit alcohol. Alcohol interferes with the elimination of uric acid from your body. If you are having a gout attack, avoid all alcohol.   Keep in mind that different people's bodies   react differently to different foods. You will probably learn over time which foods do or do not affect you. If you discover that a food tends to cause your gout to flare up, avoid eating that food. You can more freely enjoy foods that do not cause problems. If you have any questions about a food item, talk to your dietitian or health care provider.  WHICH FOODS ARE LOW, MODERATE, AND HIGH IN PURINES?  The following is a list of foods that are low, moderate, and high in purines. You can eat any amount of the foods that are low in purines. You may be able to have small amounts of foods that are moderate in purines. Ask your health care provider how much of a food moderate in purines you can have. Avoid foods high in  purines.  Grains   Foods low in purines: Enriched white bread, pasta, rice, cake, cornbread, popcorn.   Foods moderate in purines: Whole-grain breads and cereals, wheat germ, bran, oatmeal. Uncooked oatmeal. Dry wheat bran or wheat germ.   Foods high in purines: Pancakes, French toast, biscuits, muffins.  Vegetables   Foods low in purines: All vegetables, except those that are moderate in purines.   Foods moderate in purines: Asparagus, cauliflower, spinach, mushrooms, green peas.  Fruits   All fruits are low in purines.  Meats and other Protein Foods   Foods low in purines: Eggs, nuts, peanut butter.   Foods moderate in purines: 80-90% lean beef, lamb, veal, pork, poultry, fish, eggs, peanut butter, nuts. Crab, lobster, oysters, and shrimp. Cooked dried beans, peas, and lentils.   Foods high in purines: Anchovies, sardines, herring, mussels, tuna, codfish, scallops, trout, and haddock. Bacon. Organ meats (such as liver or kidney). Tripe. Game meat. Goose. Sweetbreads.  Dairy   All dairy foods are low in purines. Low-fat and fat-free dairy products are best because they are low in saturated fat.  Beverages   Drinks low in purines: Water, carbonated beverages, tea, coffee, cocoa.   Drinks moderate in purines: Soft drinks and other drinks sweetened with high-fructose corn syrup. Juices. To find whether a food or drink is sweetened with high-fructose corn syrup, look at the ingredients list.   Drinks high in purines: Alcoholic beverages (such as beer).  Condiments   Foods low in purines: Salt, herbs, olives, pickles, relishes, vinegar.   Foods moderate in purines: Butter, margarine, oils, mayonnaise.  Fats and Oils   Foods low in purines: All types, except gravies and sauces made with meat.   Foods high in purines: Gravies and sauces made with meat.  Other Foods   Foods low in purines: Sugars, sweets, gelatin. Cake. Soups made without meat.   Foods moderate in purines: Meat-based or fish-based soups,  broths, or bouillons. Foods and drinks sweetened with high-fructose corn syrup.   Foods high in purines: High-fat desserts (such as ice cream, cookies, cakes, pies, doughnuts, and chocolate).  Contact your dietitian for more information on foods that are not listed here.     This information is not intended to replace advice given to you by your health care provider. Make sure you discuss any questions you have with your health care provider.     Document Released: 06/18/2010 Document Revised: 02/26/2013 Document Reviewed: 01/28/2013  Elsevier Interactive Patient Education 2016 [employer]

## 2015-06-26 ENCOUNTER — Other Ambulatory Visit: Payer: Self-pay | Admitting: Family Medicine

## 2015-06-26 MED ORDER — DILTIAZEM HCL ER COATED BEADS 180 MG PO CP24: 180.0000 mg | ORAL_CAPSULE | Freq: Two times a day (BID) | ORAL | Status: DC

## 2015-07-03 ENCOUNTER — Encounter (HOSPITAL_COMMUNITY): Payer: Self-pay | Admitting: Emergency Medicine

## 2015-07-03 ENCOUNTER — Ambulatory Visit (HOSPITAL_COMMUNITY)
Admission: EM | Admit: 2015-07-03 | Discharge: 2015-07-03 | Disposition: A | Discharge status: Home / Self Care | Payer: Medicare Other | Attending: Internal Medicine | Admitting: Internal Medicine

## 2015-07-03 DIAGNOSIS — R5383 Other fatigue: Secondary | ICD-10-CM

## 2015-07-03 DIAGNOSIS — R1030 Lower abdominal pain, unspecified: Secondary | ICD-10-CM | POA: Diagnosis not present

## 2015-07-03 DIAGNOSIS — E876 Hypokalemia: Secondary | ICD-10-CM

## 2015-07-03 LAB — POCT I-STAT, CHEM 8
BUN: 19 mg/dL (ref 6–20)
CALCIUM ION: 1.16 mmol/L (ref 1.13–1.30)
Chloride: 95 mmol/L — ABNORMAL LOW (ref 101–111)
Creatinine, Ser: 1.4 mg/dL — ABNORMAL HIGH (ref 0.61–1.24)
Glucose, Bld: 126 mg/dL — ABNORMAL HIGH (ref 65–99)
HCT: 42 % (ref 39.0–52.0)
Hemoglobin: 14.3 g/dL (ref 13.0–17.0)
Potassium: 3.1 mmol/L — ABNORMAL LOW (ref 3.5–5.1)
SODIUM: 140 mmol/L (ref 135–145)
TCO2: 34 mmol/L (ref 0–100)

## 2015-07-03 LAB — POCT URINALYSIS DIP (DEVICE)
GLUCOSE, UA: NEGATIVE mg/dL
Hgb urine dipstick: NEGATIVE
Ketones, ur: NEGATIVE mg/dL
Leukocytes, UA: NEGATIVE
Nitrite: NEGATIVE
PROTEIN: NEGATIVE mg/dL
Specific Gravity, Urine: >=1.03 (ref 1.005–1.030)
UROBILINOGEN UA: 0.2 mg/dL (ref 0.0–1.0)
pH: 5.5 (ref 5.0–8.0)

## 2015-07-03 MED ORDER — CIPROFLOXACIN HCL 250 MG PO TABS: ORAL_TABLET | ORAL | Status: DC

## 2015-07-03 MED ORDER — METRONIDAZOLE 500 MG PO TABS: 500.0000 mg | ORAL_TABLET | Freq: Two times a day (BID) | ORAL | Status: DC

## 2015-07-03 MED ORDER — POTASSIUM CHLORIDE ER 20 MEQ PO TBCR: EXTENDED_RELEASE_TABLET | ORAL | Status: DC

## 2015-07-03 NOTE — ED Provider Notes (Signed)
CSN: [id number]     Arrival date & time 07/03/15  1656 History   First MD Initiated Contact with Patient 07/03/15 1815     Chief Complaint  Patient presents with  . Abdominal Pain   (Consider location/radiation/quality/duration/timing/severity/associated sxs/prior Treatment) HPI Comments: 75 year old male is complaining of fatigue for one week. He states it is often all and has not had it every day. The fatigue is accompanied by an onset of abdominal pain across the lower abdomen. This started approximate 6-7 days as well. The pain is off and on and does not have it constantly. He states he has had low-grade fevers at home at 99.9. He denies upper abdominal pain, nausea, vomiting or diarrhea. He states he had a problem with having a bowel movement today and felt a little constipated, otherwise he has been having normal bowel movements. He states that today as he wiped he saw a trace amount of blood on the paper. He has not seen any blood in the stool norblack tarry stools. No history of abdominal conditions other than peptic ulcer years ago after taking NSAIDs.   Past Medical History  Diagnosis Date  . Diabetes mellitus   . Hypertension   . Hyperlipidemia   . Hypothyroidism   . Seasonal allergies   . Glaucoma   . Arthritis   . Blood transfusion     2010 because of stomach ulcers  . Stomach ulcer 2010    bleeding ulcer result NSAIDS  . Basal cell carcinoma 05/28/2014    L nasal tip. Mohs Dr. Link Snuffer.    . Sinusitis     treated for bacterial infection at least once a year at [facility]  . Prostate cancer [facility])    Past Surgical History  Procedure Laterality Date  . Lumbar laminectomy  2007    [place]  . Reconstruction tendon pulley w/ tendon / fascial graft of hand / finger  1982    rt 3rd finger  . Reconstruction tendon pulley w/ tendon / fascial graft of hand / finger  1953    rt 5th finger  . Colonoscopy      2012, 5 year repeat  . Esophagogastroduodenoscopy  2010    PUD  .  Back surgery     Family History  Problem Relation Age of Onset  . Colon cancer Neg Hx   . Esophageal cancer Neg Hx   . Stomach cancer Neg Hx   . Diabetes Mother   . Diabetes Father   . Hyperlipidemia Father   . Brain cancer Brother     smoker   Social History  Substance Use Topics  . Smoking status: Former Smoker    Quit date: 02/03/2001  . Smokeless tobacco: Never Used     Comment: occasoinal cigars previously  . Alcohol Use: Yes     Comment: occasional    Review of Systems  Constitutional: Positive for fever, activity change, appetite change and fatigue.  HENT: Positive for postnasal drip and rhinorrhea.   Eyes: Negative.   Respiratory: Negative for cough, chest tightness and shortness of breath.   Cardiovascular: Negative for chest pain and leg swelling.  Gastrointestinal: Positive for abdominal pain. Negative for vomiting and diarrhea.       As per history of present illness  Genitourinary: Negative.   Musculoskeletal: Negative.   Skin: Negative.   Neurological: Negative.   Psychiatric/Behavioral: Negative.     Allergies  Aspirin; Nsaids; Amoxicillin-pot clavulanate; and Sulfa antibiotics  Home Medications   Prior to Admission medications  Medication Sig Start Date End Date Taking? Authorizing Provider  carvedilol (COREG) 25 MG tablet Take 0.5 tablets (12.5 mg total) by mouth 2 (two) times daily with a meal. 02/24/15  Yes Marin Olp, MD  chlorthalidone (HYGROTON) 25 MG tablet Take 1 tablet (25 mg total) by mouth daily. 06/16/15  Yes Marin Olp, MD  Cholecalciferol (VITAMIN D) 2000 UNITS CAPS Take by mouth daily.     Yes Historical Provider, MD  diltiazem (CARTIA XT) 180 MG 24 hr capsule Take 1 capsule (180 mg total) by mouth 2 (two) times daily. 06/26/15  Yes Marin Olp, MD  dorzolamide (TRUSOPT) 2 % ophthalmic solution  06/21/14  Yes Historical Provider, MD  fexofenadine (ALLEGRA) 180 MG tablet Take 180 mg by mouth.   Yes Historical Provider, MD   fluticasone (FLONASE) 50 MCG/ACT nasal spray Place 2 sprays into both nostrils daily. 06/05/15  Yes Marin Olp, MD  Glucosamine-Chondroit-Vit C-Mn (GLUCOSAMINE-CHONDROITIN) CAPS Take 1,500 mg by mouth.   Yes Historical Provider, MD  latanoprost (XALATAN) 0.005 % ophthalmic solution Place 1 drop into both eyes at bedtime.  01/21/11  Yes Historical Provider, MD  levothyroxine (SYNTHROID) 50 MCG tablet Take 1 tablet (50 mcg total) by mouth daily. 02/24/15  Yes Marin Olp, MD  methocarbamol (ROBAXIN) 750 MG tablet Take 1 tablet (750 mg total) by mouth every 8 (eight) hours as needed for muscle spasms. 08/25/14  Yes Marin Olp, MD  Multiple Vitamin (MULTIVITAMIN) tablet Take 1 tablet by mouth daily.     Yes Historical Provider, MD  Omega-3 Fatty Acids (FISH OIL) 1200 MG CAPS Take by mouth daily.     Yes Historical Provider, MD  omeprazole (PRILOSEC) 20 MG capsule Take 20 mg by mouth 2 (two) times daily before a meal.    Yes Historical Provider, MD  pioglitazone (ACTOS) 30 MG tablet Take 1 tablet (30 mg total) by mouth daily. 02/24/15  Yes Marin Olp, MD  pravastatin (PRAVACHOL) 40 MG tablet Take 1 tablet (40 mg total) by mouth daily. 02/24/15  Yes Marin Olp, MD  quinapril (ACCUPRIL) 40 MG tablet Take 1 tablet (40 mg total) by mouth daily. 02/24/15  Yes Marin Olp, MD  timolol (TIMOPTIC) 0.5 % ophthalmic solution  10/20/14  Yes Historical Provider, MD  acetaminophen (TYLENOL) 500 MG tablet Take 500 mg by mouth every 6 (six) hours as needed. Reported on 06/16/2015    Historical Provider, MD  azithromycin (ZITHROMAX Z-PAK) 250 MG tablet Take as directed on pack 06/18/15   Billy Fischer, MD  budesonide (RHINOCORT AQUA) 32 MCG/ACT nasal spray Place 1 spray into both nostrils 2 (two) times daily. One spray each nostril bid 06/18/15   Billy Fischer, MD  ciprofloxacin (CIPRO) 250 MG tablet Take 1 tab bid 07/03/15   Janne Napoleon, NP  glucose blood (FREESTYLE LITE) test strip Use to  check blood sugars daily. Dx: E11.9 06/16/15   Marin Olp, MD  metroNIDAZOLE (FLAGYL) 500 MG tablet Take 1 tablet (500 mg total) by mouth 2 (two) times daily. X 7 days 07/03/15   Janne Napoleon, NP  potassium chloride 20 MEQ TBCR Take one tablet, 20 mEq daily. 07/03/15   Janne Napoleon, NP  predniSONE (DELTASONE) 50 MG tablet Take 1 tablet (50 mg total) by mouth daily. 06/20/15   Konrad Felix, PA   Meds Ordered and Administered this Visit  Medications - No data to display  BP 130/50 mmHg  Pulse 62  Temp(Src) 99.5  F (37.5 C) (Oral)  Resp 14  SpO2 98% No data found.   Physical Exam  Constitutional: He is oriented to person, place, and time. He appears well-developed and well-nourished. No distress.  Appears generally well.He is fully awake and alert, self-sufficient performs his own ADLs. He is ambulatory able to get onto and off the exam table without difficulty. Nontoxic in appearance.  HENT:  Head: Normocephalic and atraumatic.  Mouth/Throat: Oropharynx is clear and moist. No oropharyngeal exudate.  Eyes: Conjunctivae and EOM are normal. Pupils are equal, round, and reactive to light.  Neck: Normal range of motion. Neck supple.  Cardiovascular: Normal rate, regular rhythm and normal heart sounds.   Pulmonary/Chest: Effort normal and breath sounds normal. No respiratory distress. He has no wheezes. He has no rales.  Abdominal: Soft. Bowel sounds are normal. He exhibits no distension and no mass. There is no rebound and no guarding.  Mild tenderness in the left and right lower most quadrants.No rebound or guarding.  Musculoskeletal: Normal range of motion. He exhibits no edema.  Lymphadenopathy:    He has no cervical adenopathy.  Neurological: He is alert and oriented to person, place, and time. No cranial nerve deficit. He exhibits normal muscle tone.  Skin: Skin is warm and dry.  Psychiatric: He has a normal mood and affect.  Nursing note and vitals reviewed.   ED Course   Procedures (including critical care time)  Labs Review Labs Reviewed  POCT URINALYSIS DIP (DEVICE) - Abnormal; Notable for the following:    Bilirubin Urine SMALL (*)    All other components within normal limits  POCT I-STAT, CHEM 8 - Abnormal; Notable for the following:    Potassium 3.1 (*)    Chloride 95 (*)    Creatinine, Ser 1.40 (*)    Glucose, Bld 126 (*)    All other components within normal limits   Results for orders placed or performed during the hospital encounter of 07/03/15  POCT urinalysis dip (device)  Result Value Ref Range   Glucose, UA NEGATIVE NEGATIVE mg/dL   Bilirubin Urine SMALL (A) NEGATIVE   Ketones, ur NEGATIVE NEGATIVE mg/dL   Specific Gravity, Urine >=1.030 1.005 - 1.030   Hgb urine dipstick NEGATIVE NEGATIVE   pH 5.5 5.0 - 8.0   Protein, ur NEGATIVE NEGATIVE mg/dL   Urobilinogen, UA 0.2 0.0 - 1.0 mg/dL   Nitrite NEGATIVE NEGATIVE   Leukocytes, UA NEGATIVE NEGATIVE  I-STAT, chem 8  Result Value Ref Range   Sodium 140 135 - 145 mmol/L   Potassium 3.1 (L) 3.5 - 5.1 mmol/L   Chloride 95 (L) 101 - 111 mmol/L   BUN 19 6 - 20 mg/dL   Creatinine, Ser 1.40 (H) 0.61 - 1.24 mg/dL   Glucose, Bld 126 (H) 65 - 99 mg/dL   Calcium, Ion 1.16 1.13 - 1.30 mmol/L   TCO2 34 0 - 100 mmol/L   Hemoglobin 14.3 13.0 - 17.0 g/dL   HCT 42.0 39.0 - 52.0 %     Imaging Review No results found.   Visual Acuity Review  Right Eye Distance:   Left Eye Distance:   Bilateral Distance:    Right Eye Near:   Left Eye Near:    Bilateral Near:         MDM   1. Hypokalemia   2. Other fatigue   3. Lower abdominal pain    Fatigue, lower abdominal pain, low-grade fever may representdiverticulitis.In addition the potassium is 3.1 which could also explain  his fatigue. We will supplement potassium with 20 mEq Robyne Peers once a day. Must see PCP next week Meds ordered this encounter  Medications  . potassium chloride 20 MEQ TBCR    Sig: Take one tablet, 20 mEq daily.     Dispense:  10 tablet    Refill:  0    Order Specific Question:  Supervising Provider    Answer:  Sherlene Shams [id number]  . ciprofloxacin (CIPRO) 250 MG tablet    Sig: Take 1 tab bid    Dispense:  14 tablet    Refill:  0    Order Specific Question:  Supervising Provider    Answer:  Sherlene Shams [id number]  . metroNIDAZOLE (FLAGYL) 500 MG tablet    Sig: Take 1 tablet (500 mg total) by mouth 2 (two) times daily. X 7 days    Dispense:  14 tablet    Refill:  0    Order Specific Question:  Supervising Provider    Answer:  Sherlene Shams [id number]       Janne Napoleon, NP 07/03/15 2000

## 2015-07-03 NOTE — Discharge Instructions (Signed)
Abdominal Pain, Adult °Many things can cause abdominal pain. Usually, abdominal pain is not caused by a disease and will improve without treatment. It can often be observed and treated at home. Your health care provider will do a physical exam and possibly order blood tests and X-rays to help determine the seriousness of your pain. However, in many cases, more time must pass before a clear cause of the pain can be found. Before that point, your health care provider may not know if you need more testing or further treatment. °HOME CARE INSTRUCTIONS °Monitor your abdominal pain for any changes. The following actions may help to alleviate any discomfort you are experiencing: °· Only take over-the-counter or prescription medicines as directed by your health care provider. °· Do not take laxatives unless directed to do so by your health care provider. °· Try a clear liquid diet (broth, tea, or water) as directed by your health care provider. Slowly move to a bland diet as tolerated. °SEEK MEDICAL CARE IF: °· You have unexplained abdominal pain. °· You have abdominal pain associated with nausea or diarrhea. °· You have pain when you urinate or have a bowel movement. °· You experience abdominal pain that wakes you in the night. °· You have abdominal pain that is worsened or improved by eating food. °· You have abdominal pain that is worsened with eating fatty foods. °· You have a fever. °SEEK IMMEDIATE MEDICAL CARE IF: °· Your pain does not go away within 2 hours. °· You keep throwing up (vomiting). °· Your pain is felt only in portions of the abdomen, such as the right side or the left lower portion of the abdomen. °· You pass bloody or black tarry stools. °MAKE SURE YOU: °· Understand these instructions. °· Will watch your condition. °· Will get help right away if you are not doing well or get worse. °  °This information is not intended to replace advice given to you by your health care provider. Make sure you discuss  any questions you have with your health care provider. °  °Document Released: 12/01/2004 Document Revised: 11/12/2014 Document Reviewed: 10/31/2012 °Elsevier Interactive Patient Education ©2016 [employer] ° °Hypokalemia °Hypokalemia means that the amount of potassium in the blood is lower than normal. Potassium is a chemical, called an electrolyte, that helps regulate the amount of fluid in the body. It also stimulates muscle contraction and helps nerves function properly. Most of the body's potassium is inside of cells, and only a very small amount is in the blood. Because the amount in the blood is so small, minor changes can be life-threatening. °CAUSES °· Antibiotics. °· Diarrhea or vomiting. °· Using laxatives too much, which can cause diarrhea. °· Chronic kidney disease. °· Water pills (diuretics). °· Eating disorders (bulimia). °· Low magnesium level. °· Sweating a lot. °SIGNS AND SYMPTOMS °· Weakness. °· Constipation. °· Fatigue. °· Muscle cramps. °· Mental confusion. °· Skipped heartbeats or irregular heartbeat (palpitations). °· Tingling or numbness. °DIAGNOSIS  °Your health care provider can diagnose hypokalemia with blood tests. In addition to checking your potassium level, your health care provider may also check other lab tests. °TREATMENT °Hypokalemia can be treated with potassium supplements taken by mouth or adjustments in your current medicines. If your potassium level is very low, you may need to get potassium through a vein (IV) and be monitored in the hospital. A diet high in potassium is also helpful. Foods high in potassium are: °· Nuts, such as peanuts and pistachios. °· Seeds, such as   sunflower seeds and pumpkin seeds.  Peas, lentils, and lima beans.  Whole grain and bran cereals and breads.  Fresh fruit and vegetables, such as apricots, avocado, bananas, cantaloupe, kiwi, oranges, tomatoes, asparagus, and potatoes.  Orange and tomato juices.  Red meats.  Fruit yogurt. HOME  CARE INSTRUCTIONS  Take all medicines as prescribed by your health care provider.  Maintain a healthy diet by including nutritious food, such as fruits, vegetables, nuts, whole grains, and lean meats.  If you are taking a laxative, be sure to follow the directions on the label. SEEK MEDICAL CARE IF:  Your weakness gets worse.  You feel your heart pounding or racing.  You are vomiting or having diarrhea.  You are diabetic and having trouble keeping your blood glucose in the normal range. SEEK IMMEDIATE MEDICAL CARE IF:  You have chest pain, shortness of breath, or dizziness.  You are vomiting or having diarrhea for more than 2 days.  You faint. MAKE SURE YOU:   Understand these instructions.  Will watch your condition.  Will get help right away if you are not doing well or get worse.   This information is not intended to replace advice given to you by your health care provider. Make sure you discuss any questions you have with your health care provider.   Document Released: 02/21/2005 Document Revised: 03/14/2014 Document Reviewed: 08/24/2012 Elsevier Interactive Patient Education 2016 [employer].  Fatigue Fatigue is feeling tired all of the time, a lack of energy, or a lack of motivation. Occasional or mild fatigue is often a normal response to activity or life in general. However, long-lasting (chronic) or extreme fatigue may indicate an underlying medical condition. HOME CARE INSTRUCTIONS  Watch your fatigue for any changes. The following actions may help to lessen any discomfort you are feeling:  Talk to your health care provider about how much sleep you need each night. Try to get the required amount every night.  Take medicines only as directed by your health care provider.  Eat a healthy and nutritious diet. Ask your health care provider if you need help changing your diet.  Drink enough fluid to keep your urine clear or pale yellow.  Practice ways of  relaxing, such as yoga, meditation, massage therapy, or acupuncture.  Exercise regularly.   Change situations that cause you stress. Try to keep your work and personal routine reasonable.  Do not abuse illegal drugs.  Limit alcohol intake to no more than 1 drink per day for nonpregnant women and 2 drinks per day for men. One drink equals 12 ounces of beer, 5 ounces of wine, or 1 ounces of hard liquor.  Take a multivitamin, if directed by your health care provider. SEEK MEDICAL CARE IF:   Your fatigue does not get better.  You have a fever.   You have unintentional weight loss or gain.  You have headaches.   You have difficulty:   Falling asleep.  Sleeping throughout the night.  You feel angry, guilty, anxious, or sad.   You are unable to have a bowel movement (constipation).   You skin is dry.   Your legs or another part of your body is swollen.  SEEK IMMEDIATE MEDICAL CARE IF:   You feel confused.   Your vision is blurry.  You feel faint or pass out.   You have a severe headache.   You have severe abdominal, pelvic, or back pain.   You have chest pain, shortness of breath, or an irregular  or fast heartbeat.   You are unable to urinate or you urinate less than normal.   You develop abnormal bleeding, such as bleeding from the rectum, vagina, nose, lungs, or nipples.  You vomit blood.   You have thoughts about harming yourself or committing suicide.   You are worried that you might harm someone else.    This information is not intended to replace advice given to you by your health care provider. Make sure you discuss any questions you have with your health care provider.   Document Released: 12/19/2006 Document Revised: 03/14/2014 Document Reviewed: 06/25/2013 Elsevier Interactive Patient Education [employer].

## 2015-07-03 NOTE — ED Notes (Signed)
C/o lower abd pain onset 4/22 associated w/fatigue and fevers Denies urinary sx, n/v/d A&O x4... No acute distress.

## 2015-07-09 ENCOUNTER — Encounter: Payer: Self-pay | Admitting: Family Medicine

## 2015-07-09 ENCOUNTER — Ambulatory Visit (INDEPENDENT_AMBULATORY_CARE_PROVIDER_SITE_OTHER): Payer: Medicare Other | Admitting: Family Medicine

## 2015-07-09 VITALS — BP 136/72 | HR 67 | Temp 98.5°F | Wt 211.0 lb

## 2015-07-09 DIAGNOSIS — M1A0720 Idiopathic chronic gout, left ankle and foot, without tophus (tophi): Secondary | ICD-10-CM | POA: Diagnosis not present

## 2015-07-09 DIAGNOSIS — M10079 Idiopathic gout, unspecified ankle and foot: Secondary | ICD-10-CM

## 2015-07-09 DIAGNOSIS — E875 Hyperkalemia: Secondary | ICD-10-CM

## 2015-07-09 DIAGNOSIS — M109 Gout, unspecified: Secondary | ICD-10-CM

## 2015-07-09 DIAGNOSIS — E876 Hypokalemia: Secondary | ICD-10-CM

## 2015-07-09 DIAGNOSIS — K5732 Diverticulitis of large intestine without perforation or abscess without bleeding: Secondary | ICD-10-CM

## 2015-07-09 LAB — BASIC METABOLIC PANEL
BUN: 13 mg/dL (ref 6–23)
CHLORIDE: 96 meq/L (ref 96–112)
CO2: 34 meq/L — AB (ref 19–32)
Calcium: 9.7 mg/dL (ref 8.4–10.5)
Creatinine, Ser: 1.44 mg/dL (ref 0.40–1.50)
GFR: 50.54 mL/min — ABNORMAL LOW (ref >60.00)
Glucose, Bld: 106 mg/dL — ABNORMAL HIGH (ref 70–99)
Potassium: 3.4 mEq/L — ABNORMAL LOW (ref 3.5–5.1)
Sodium: 141 mEq/L (ref 135–145)

## 2015-07-09 LAB — URIC ACID: Uric Acid, Serum: 9.6 mg/dL — ABNORMAL HIGH (ref 4.0–7.8)

## 2015-07-09 NOTE — Assessment & Plan Note (Signed)
S: Patient has now had 3 episodes of hot, swollen, painful joint in 5th toe over last 1.5 years. Seen in urgent care 06/20/15,he was treated with prednisone and this improved. I have not seen him during one of these episodes. He had before this episode had 2 beers while eating shrimp A/P: will check uric acid. Think he may be able to control with dietary modifications and discussed alcohol and shellfish. Would consider medication if uric acid above 7 or if below 7 but continues to have attacks with dietary changes. Had later may appointment but we will likely push back a few months depending on lab findings

## 2015-07-09 NOTE — Progress Notes (Signed)
Subjective:  Eddie Ferguson. Eddie Ferguson is a 75 y.o. year old very pleasant male patient who presents for/with See problem oriented charting ROS- no fever, chills, nausea, vomiting. Hot, swollen joint now resolved. No further abdominal pain.   Past Medical History-  Patient Active Problem List   Diagnosis Date Noted  . Prostate cancer ([place]) 05/28/2014    Priority: High  . Diabetes mellitus type 2, controlled ([place]) 04/23/2014    Priority: High  . Short-term memory loss 06/16/2015    Priority: Medium  . Essential hypertension 04/23/2014    Priority: Medium  . Hyperlipidemia 04/23/2014    Priority: Medium  . Hypothyroidism 04/23/2014    Priority: Medium  . Stomach ulcer     Priority: Medium  . Adenomatous colon polyp 05/28/2014    Priority: Low  . Osteoarthritis 05/28/2014    Priority: Low  . Erectile dysfunction 05/28/2014    Priority: Low  . Basal cell carcinoma 05/28/2014    Priority: Low  . GERD (gastroesophageal reflux disease) 04/23/2014    Priority: Low  . Allergic rhinitis 04/23/2014    Priority: Low  . Glaucoma 04/23/2014    Priority: Low  . Gout 07/09/2015    Medications- reviewed and updated Current Outpatient Prescriptions  Medication Sig Dispense Refill  . acetaminophen (TYLENOL) 500 MG tablet Take 500 mg by mouth every 6 (six) hours as needed. Reported on 06/16/2015    . carvedilol (COREG) 25 MG tablet Take 0.5 tablets (12.5 mg total) by mouth 2 (two) times daily with a meal. 180 tablet 3  . chlorthalidone (HYGROTON) 25 MG tablet Take 1 tablet (25 mg total) by mouth daily. 90 tablet 3  . Cholecalciferol (VITAMIN D) 2000 UNITS CAPS Take by mouth daily.      Marland Kitchen diltiazem (CARTIA XT) 180 MG 24 hr capsule Take 1 capsule (180 mg total) by mouth 2 (two) times daily. 180 capsule 0  . dorzolamide (TRUSOPT) 2 % ophthalmic solution   2  . fexofenadine (ALLEGRA) 180 MG tablet Take 180 mg by mouth.    . fluticasone (FLONASE) 50 MCG/ACT nasal spray Place 2 sprays into both nostrils  daily. 48 g 3  . Glucosamine-Chondroit-Vit C-Mn (GLUCOSAMINE-CHONDROITIN) CAPS Take 1,500 mg by mouth.    Marland Kitchen glucose blood (FREESTYLE LITE) test strip Use to check blood sugars daily. Dx: E11.9 100 each 12  . latanoprost (XALATAN) 0.005 % ophthalmic solution Place 1 drop into both eyes at bedtime.     Marland Kitchen levothyroxine (SYNTHROID) 50 MCG tablet Take 1 tablet (50 mcg total) by mouth daily. 90 tablet 3  . methocarbamol (ROBAXIN) 750 MG tablet Take 1 tablet (750 mg total) by mouth every 8 (eight) hours as needed for muscle spasms. 60 tablet 3  . Multiple Vitamin (MULTIVITAMIN) tablet Take 1 tablet by mouth daily.      . Omega-3 Fatty Acids (FISH OIL) 1200 MG CAPS Take by mouth daily.      Marland Kitchen omeprazole (PRILOSEC) 20 MG capsule Take 20 mg by mouth 2 (two) times daily before a meal.     . pioglitazone (ACTOS) 30 MG tablet Take 1 tablet (30 mg total) by mouth daily. 90 tablet 3  . potassium chloride 20 MEQ TBCR Take one tablet, 20 mEq daily. 10 tablet 0  . pravastatin (PRAVACHOL) 40 MG tablet Take 1 tablet (40 mg total) by mouth daily. 90 tablet 3  . quinapril (ACCUPRIL) 40 MG tablet Take 1 tablet (40 mg total) by mouth daily. 90 tablet 3  . timolol (TIMOPTIC) 0.5 %  ophthalmic solution   1  . budesonide (RHINOCORT AQUA) 32 MCG/ACT nasal spray Place 1 spray into both nostrils 2 (two) times daily. One spray each nostril bid (Patient not taking: Reported on 07/09/2015) 1 Bottle 1   No current facility-administered medications for this visit.    Objective: BP 136/72 mmHg  Pulse 67  Temp(Src) 98.5 F (36.9 C)  Wt 211 lb (95.709 kg) Gen: NAD, resting comfortably CV: RRR no murmurs rubs or gallops Lungs: CTAB no crackles, wheeze, rhonchi Abdomen: soft/nontender/nondistended/normal bowel sounds. No rebound or guarding.  Ext: no edema Skin: warm, dry Neuro: grossly normal, moves all extremities  Assessment/Plan:  Gout S: Patient has now had 3 episodes of hot, swollen, painful joint in 5th toe over  last 1.5 years. Seen in urgent care 06/20/15,he was treated with prednisone and this improved. I have not seen him during one of these episodes. He had before this episode had 2 beers while eating shrimp A/P: will check uric acid. Think he may be able to control with dietary modifications and discussed alcohol and shellfish. Would consider medication if uric acid above 7 or if below 7 but continues to have attacks with dietary changes. Had later may appointment but we will likely push back a few months depending on lab findings  Diverticulitis Hypokalemia S: Patient seen in urgent care for lower abdominal pain, low grade fevers, fatigue about a week ago. Admits he had wiped too hard one time and had slight amount of blood on tissue but none since that time. He was started on cipro/flagyl. Also had low potassium at 3.1 and placed on potassium for a week. He is still taking today ROS- no further fever, chills. No nausea/vomiting. Abdominal pain has resolved. Still has some mild fatigue A/P: update bmet- likely can stop potassium at this point. Also had mild changes in creatinine- recheck this as well. Diverticulitis has healed- no further antibiotics needed.   3 months. Return precautions advised.   Orders Placed This Encounter  Procedures  . Basic metabolic panel    [place]  . Uric Acid   The duration of face-to-face time during this visit was 25 minutes. Greater than 50% of this time was spent in counseling, explanation of diagnosis, planning of further management, and/or coordination of care.    Garret Reddish, MD

## 2015-07-09 NOTE — Patient Instructions (Addendum)
Labs before you leave  May consider medicine to prevent gout but I think you may be able to prevent this by watching diet  Glad the diverticulitis has healed  If fatigue not better within a month or if worsening- return to see me   Gout Gout is an inflammatory arthritis caused by a buildup of uric acid crystals in the joints. Uric acid is a chemical that is normally present in the blood. When the level of uric acid in the blood is too high it can form crystals that deposit in your joints and tissues. This causes joint redness, soreness, and swelling (inflammation). Repeat attacks are common. Over time, uric acid crystals can form into masses (tophi) near a joint, destroying bone and causing disfigurement. Gout is treatable and often preventable. CAUSES  The disease begins with elevated levels of uric acid in the blood. Uric acid is produced by your body when it breaks down a naturally found substance called purines. Certain foods you eat, such as meats and fish, contain high amounts of purines. Causes of an elevated uric acid level include:  Being passed down from parent to child (heredity).  Diseases that cause increased uric acid production (such as obesity, psoriasis, and certain cancers).  Excessive alcohol use.  Diet, especially diets rich in meat and seafood.  Medicines, including certain cancer-fighting medicines (chemotherapy), water pills (diuretics), and aspirin.  Chronic kidney disease. The kidneys are no longer able to remove uric acid well.  Problems with metabolism. Conditions strongly associated with gout include:  Obesity.  High blood pressure.  High cholesterol.  Diabetes. Not everyone with elevated uric acid levels gets gout. It is not understood why some people get gout and others do not. Surgery, joint injury, and eating too much of certain foods are some of the factors that can lead to gout attacks. SYMPTOMS   An attack of gout comes on quickly. It causes  intense pain with redness, swelling, and warmth in a joint.  Fever can occur.  Often, only one joint is involved. Certain joints are more commonly involved:  Base of the big toe.  Knee.  Ankle.  Wrist.  Finger. Without treatment, an attack usually goes away in a few days to weeks. Between attacks, you usually will not have symptoms, which is different from many other forms of arthritis. DIAGNOSIS  Your caregiver will suspect gout based on your symptoms and exam. In some cases, tests may be recommended. The tests may include:  Blood tests.  Urine tests.  X-rays.  Joint fluid exam. This exam requires a needle to remove fluid from the joint (arthrocentesis). Using a microscope, gout is confirmed when uric acid crystals are seen in the joint fluid. TREATMENT  There are two phases to gout treatment: treating the sudden onset (acute) attack and preventing attacks (prophylaxis).  Treatment of an Acute Attack.  Medicines are used. These include anti-inflammatory medicines or steroid medicines.  An injection of steroid medicine into the affected joint is sometimes necessary.  The painful joint is rested. Movement can worsen the arthritis.  You may use warm or cold treatments on painful joints, depending which works best for you.  Treatment to Prevent Attacks.  If you suffer from frequent gout attacks, your caregiver may advise preventive medicine. These medicines are started after the acute attack subsides. These medicines either help your kidneys eliminate uric acid from your body or decrease your uric acid production. You may need to stay on these medicines for a very long time.  The early phase of treatment with preventive medicine can be associated with an increase in acute gout attacks. For this reason, during the first few months of treatment, your caregiver may also advise you to take medicines usually used for acute gout treatment. Be sure you understand your caregiver's  directions. Your caregiver may make several adjustments to your medicine dose before these medicines are effective.  Discuss dietary treatment with your caregiver or dietitian. Alcohol and drinks high in sugar and fructose and foods such as meat, poultry, and seafood can increase uric acid levels. Your caregiver or dietitian can advise you on drinks and foods that should be limited. HOME CARE INSTRUCTIONS   Do not take aspirin to relieve pain. This raises uric acid levels.  Only take over-the-counter or prescription medicines for pain, discomfort, or fever as directed by your caregiver.  Rest the joint as much as possible. When in bed, keep sheets and blankets off painful areas.  Keep the affected joint raised (elevated).  Apply warm or cold treatments to painful joints. Use of warm or cold treatments depends on which works best for you.  Use crutches if the painful joint is in your leg.  Drink enough fluids to keep your urine clear or pale yellow. This helps your body get rid of uric acid. Limit alcohol, sugary drinks, and fructose drinks.  Follow your dietary instructions. Pay careful attention to the amount of protein you eat. Your daily diet should emphasize fruits, vegetables, whole grains, and fat-free or low-fat milk products. Discuss the use of coffee, vitamin C, and cherries with your caregiver or dietitian. These may be helpful in lowering uric acid levels.  Maintain a healthy body weight. SEEK MEDICAL CARE IF:   You develop diarrhea, vomiting, or any side effects from medicines.  You do not feel better in 24 hours, or you are getting worse. SEEK IMMEDIATE MEDICAL CARE IF:   Your joint becomes suddenly more tender, and you have chills or a fever. MAKE SURE YOU:   Understand these instructions.  Will watch your condition.  Will get help right away if you are not doing well or get worse.   This information is not intended to replace advice given to you by your health  care provider. Make sure you discuss any questions you have with your health care provider.   Document Released: 02/19/2000 Document Revised: 03/14/2014 Document Reviewed: 10/05/2011 Elsevier Interactive Patient Education [employer].

## 2015-07-10 ENCOUNTER — Other Ambulatory Visit: Payer: Self-pay

## 2015-07-10 MED ORDER — POTASSIUM CHLORIDE ER 20 MEQ PO TBCR: EXTENDED_RELEASE_TABLET | ORAL | Status: DC

## 2015-07-21 ENCOUNTER — Encounter (HOSPITAL_COMMUNITY): Payer: Self-pay | Admitting: *Deleted

## 2015-07-21 ENCOUNTER — Ambulatory Visit (HOSPITAL_COMMUNITY)
Admission: EM | Admit: 2015-07-21 | Discharge: 2015-07-21 | Disposition: A | Discharge status: Home / Self Care | Payer: Medicare Other | Attending: Family Medicine | Admitting: Family Medicine

## 2015-07-21 DIAGNOSIS — J069 Acute upper respiratory infection, unspecified: Secondary | ICD-10-CM

## 2015-07-21 MED ORDER — AZITHROMYCIN 250 MG PO TABS: ORAL_TABLET | ORAL | Status: DC

## 2015-07-21 MED ORDER — IPRATROPIUM BROMIDE 0.06 % NA SOLN: 2.0000 | Freq: Four times a day (QID) | NASAL | Status: DC

## 2015-07-21 NOTE — ED Provider Notes (Signed)
CSN: [id number]     Arrival date & time 07/21/15  1733 History   First MD Initiated Contact with Patient 07/21/15 1936     Chief Complaint  Patient presents with  . Sore Throat   (Consider location/radiation/quality/duration/timing/severity/associated sxs/prior Treatment) Patient is a 75 y.o. male presenting with pharyngitis. The history is provided by the patient.  Sore Throat This is a new problem. The current episode started more than 2 days ago. The problem has been gradually worsening. The symptoms are aggravated by swallowing.    Past Medical History  Diagnosis Date  . Diabetes mellitus   . Hypertension   . Hyperlipidemia   . Hypothyroidism   . Seasonal allergies   . Glaucoma   . Arthritis   . Blood transfusion     2010 because of stomach ulcers  . Stomach ulcer 2010    bleeding ulcer result NSAIDS  . Basal cell carcinoma 05/28/2014    L nasal tip. Mohs Dr. Link Snuffer.    . Sinusitis     treated for bacterial infection at least once a year at [facility]  . Prostate cancer [facility])    Past Surgical History  Procedure Laterality Date  . Lumbar [place]aminectomy  2007    [place]  . Reconstruction tendon pulley w/ tendon / fascial graft of hand / finger  1982    rt 3rd finger  . Reconstruction tendon pulley w/ tendon / fascial graft of hand / finger  1953    rt 5th finger  . Colonoscopy      2012, 5 year repeat  . Esophagogastroduodenoscopy  2010    PUD  . Back surgery     Family History  Problem Relation Age of Onset  . Colon cancer Neg Hx   . Esophageal cancer Neg Hx   . Stomach cancer Neg Hx   . Diabetes Mother   . Diabetes Father   . Hyperlipidemia Father   . Brain cancer Brother     smoker   Social History  Substance Use Topics  . Smoking status: Former Smoker    Quit date: 02/03/2001  . Smokeless tobacco: Never Used     Comment: occasoinal cigars previously  . Alcohol Use: Yes     Comment: occasional    Review of Systems  Constitutional: Negative.   HENT:  Positive for congestion, postnasal drip, rhinorrhea and sore throat.   Respiratory: Negative.   Cardiovascular: Negative.   Gastrointestinal: Negative.   All other systems reviewed and are negative.   Allergies  Aspirin; Nsaids; Amoxicillin-pot clavulanate; and Sulfa antibiotics  Home Medications   Prior to Admission medications   Medication Sig Start Date End Date Taking? Authorizing Provider  acetaminophen (TYLENOL) 500 MG tablet Take 500 mg by mouth every 6 (six) hours as needed. Reported on 06/16/2015    Historical Provider, MD  budesonide (RHINOCORT AQUA) 32 MCG/ACT nasal spray Place 1 spray into both nostrils 2 (two) times daily. One spray each nostril bid Patient not taking: Reported on 07/09/2015 06/18/15   Billy Fischer, MD  carvedilol (COREG) 25 MG tablet Take 0.5 tablets (12.5 mg total) by mouth 2 (two) times daily with a meal. 02/24/15   Marin Olp, MD  chlorthalidone (HYGROTON) 25 MG tablet Take 1 tablet (25 mg total) by mouth daily. 06/16/15   Marin Olp, MD  Cholecalciferol (VITAMIN D) 2000 UNITS CAPS Take by mouth daily.      Historical Provider, MD  diltiazem (CARTIA XT) 180 MG 24 hr capsule Take  1 capsule (180 mg total) by mouth 2 (two) times daily. 06/26/15   Marin Olp, MD  dorzolamide (TRUSOPT) 2 % ophthalmic solution  06/21/14   Historical Provider, MD  fexofenadine (ALLEGRA) 180 MG tablet Take 180 mg by mouth.    Historical Provider, MD  fluticasone (FLONASE) 50 MCG/ACT nasal spray Place 2 sprays into both nostrils daily. 06/05/15   Marin Olp, MD  Glucosamine-Chondroit-Vit C-Mn (GLUCOSAMINE-CHONDROITIN) CAPS Take 1,500 mg by mouth.    Historical Provider, MD  glucose blood (FREESTYLE LITE) test strip Use to check blood sugars daily. Dx: E11.9 06/16/15   Marin Olp, MD  latanoprost (XALATAN) 0.005 % ophthalmic solution Place 1 drop into both eyes at bedtime.  01/21/11   Historical Provider, MD  levothyroxine (SYNTHROID) 50 MCG tablet Take 1  tablet (50 mcg total) by mouth daily. 02/24/15   Marin Olp, MD  methocarbamol (ROBAXIN) 750 MG tablet Take 1 tablet (750 mg total) by mouth every 8 (eight) hours as needed for muscle spasms. 08/25/14   Marin Olp, MD  Multiple Vitamin (MULTIVITAMIN) tablet Take 1 tablet by mouth daily.      Historical Provider, MD  Omega-3 Fatty Acids (FISH OIL) 1200 MG CAPS Take by mouth daily.      Historical Provider, MD  omeprazole (PRILOSEC) 20 MG capsule Take 20 mg by mouth 2 (two) times daily before a meal.     Historical Provider, MD  pioglitazone (ACTOS) 30 MG tablet Take 1 tablet (30 mg total) by mouth daily. 02/24/15   Marin Olp, MD  Potassium Chloride ER 20 MEQ TBCR Take one tablet, 20 mEq daily. 07/10/15   Marin Olp, MD  pravastatin (PRAVACHOL) 40 MG tablet Take 1 tablet (40 mg total) by mouth daily. 02/24/15   Marin Olp, MD  quinapril (ACCUPRIL) 40 MG tablet Take 1 tablet (40 mg total) by mouth daily. 02/24/15   Marin Olp, MD  timolol (TIMOPTIC) 0.5 % ophthalmic solution  10/20/14   Historical Provider, MD   Meds Ordered and Administered this Visit  Medications - No data to display  There were no vitals taken for this visit. No data found.   Physical Exam  Constitutional: He is oriented to person, place, and time. He appears well-developed and well-nourished. No distress.  HENT:  Right Ear: External ear normal.  Left Ear: External ear normal.  Mouth/Throat: Oropharynx is clear and moist.  Eyes: Pupils are equal, round, and reactive to light.  Neck: Normal range of motion. Neck supple.  Cardiovascular: Regular rhythm and normal heart sounds.   Pulmonary/Chest: Effort normal and breath sounds normal.  Lymphadenopathy:    He has no cervical adenopathy.  Neurological: He is alert and oriented to person, place, and time.  Skin: Skin is warm and dry.  Nursing note and vitals reviewed.   ED Course  Procedures (including critical care time)  Labs  Review Labs Reviewed - No data to display  Imaging Review No results found.   Visual Acuity Review  Right Eye Distance:   Left Eye Distance:   Bilateral Distance:    Right Eye Near:   Left Eye Near:    Bilateral Near:         MDM  No diagnosis found.  Meds ordered this encounter  Medications  . ipratropium (ATROVENT) 0.06 % nasal spray    Sig: Place 2 sprays into both nostrils 4 (four) times daily.    Dispense:  15 mL  Refill:  1  . azithromycin (ZITHROMAX Z-PAK) 250 MG tablet    Sig: Take as directed on pack    Dispense:  6 tablet    Refill:  0     Billy Fischer, MD 07/21/15 1949

## 2015-07-21 NOTE — Discharge Instructions (Signed)
Drink plenty of fluids as discussed, use medicine as prescribed, and mucinex or delsym for cough. Return or see your doctor if further problems °

## 2015-07-21 NOTE — ED Notes (Signed)
sorethroat  Post  Nasal  Drip  X  5  Days   Symptoms  Not  releived  By  otc  meds

## 2015-07-30 ENCOUNTER — Ambulatory Visit (INDEPENDENT_AMBULATORY_CARE_PROVIDER_SITE_OTHER): Payer: Medicare Other | Admitting: Family Medicine

## 2015-07-30 ENCOUNTER — Encounter: Payer: Self-pay | Admitting: Family Medicine

## 2015-07-30 VITALS — BP 124/70 | HR 59 | Wt 208.0 lb

## 2015-07-30 DIAGNOSIS — E034 Atrophy of thyroid (acquired): Secondary | ICD-10-CM

## 2015-07-30 DIAGNOSIS — M1A0720 Idiopathic chronic gout, left ankle and foot, without tophus (tophi): Secondary | ICD-10-CM | POA: Diagnosis not present

## 2015-07-30 DIAGNOSIS — E119 Type 2 diabetes mellitus without complications: Secondary | ICD-10-CM | POA: Diagnosis not present

## 2015-07-30 DIAGNOSIS — E038 Other specified hypothyroidism: Secondary | ICD-10-CM

## 2015-07-30 DIAGNOSIS — C61 Malignant neoplasm of prostate: Secondary | ICD-10-CM

## 2015-07-30 DIAGNOSIS — I10 Essential (primary) hypertension: Secondary | ICD-10-CM | POA: Diagnosis not present

## 2015-07-30 LAB — COMPREHENSIVE METABOLIC PANEL
ALBUMIN: 4.1 g/dL (ref 3.5–5.2)
ALK PHOS: 51 U/L (ref 39–117)
ALT: 12 U/L (ref 0–53)
AST: 14 U/L (ref 0–37)
BUN: 15 mg/dL (ref 6–23)
CHLORIDE: 96 meq/L (ref 96–112)
CO2: 36 mEq/L — ABNORMAL HIGH (ref 19–32)
Calcium: 9.5 mg/dL (ref 8.4–10.5)
Creatinine, Ser: 1.22 mg/dL (ref 0.40–1.50)
GFR: 61.19 mL/min (ref >60.00)
Glucose, Bld: 109 mg/dL — ABNORMAL HIGH (ref 70–99)
POTASSIUM: 3.4 meq/L — AB (ref 3.5–5.1)
SODIUM: 139 meq/L (ref 135–145)
TOTAL PROTEIN: 5.9 g/dL — AB (ref 6.0–8.3)
Total Bilirubin: 0.7 mg/dL (ref 0.2–1.2)

## 2015-07-30 LAB — TSH: TSH: 2.65 u[IU]/mL (ref 0.35–4.50)

## 2015-07-30 LAB — HEMOGLOBIN A1C: Hgb A1c MFr Bld: 6.5 % (ref 4.6–6.5)

## 2015-07-30 MED ORDER — COLCHICINE 0.6 MG PO TABS: ORAL_TABLET | ORAL | Status: DC

## 2015-07-30 NOTE — Patient Instructions (Addendum)
For gout- see colchicine instructions below  Labs before you leave  No other adjustments in medicine today. Glad you are doing well after prostate cancer treatment.

## 2015-07-30 NOTE — Assessment & Plan Note (Signed)
05/26/15 urology- PSA trending down as expected after external beam radiation

## 2015-07-30 NOTE — Assessment & Plan Note (Signed)
S: well  controlled. On actos alone CBGs- mainly in low 100s, higher only when on prednisone Lab Results  Component Value Date   HGBA1C 6.2 02/24/2015   HGBA1C 6.3 08/25/2014   HGBA1C 6.8* 04/23/2014   A/P: update a1c today

## 2015-07-30 NOTE — Progress Notes (Signed)
Subjective:  Eddie Ferguson is a 75 y.o. year old very pleasant male patient who presents for/with See problem oriented charting ROS- No chest pain or shortness of breath. No headache or blurry vision. Has had some hot swollen joints intermittently. .see any ROS included in HPI as well.   Past Medical History-  Patient Active Problem List   Diagnosis Date Noted  . Prostate cancer ([place]) 05/28/2014    Priority: High  . Diabetes mellitus type 2, controlled ([place]) 04/23/2014    Priority: High  . Gout 07/09/2015    Priority: Medium  . Short-term memory loss 06/16/2015    Priority: Medium  . Essential hypertension 04/23/2014    Priority: Medium  . Hyperlipidemia 04/23/2014    Priority: Medium  . Hypothyroidism 04/23/2014    Priority: Medium  . Stomach ulcer     Priority: Medium  . Adenomatous colon polyp 05/28/2014    Priority: Low  . Osteoarthritis 05/28/2014    Priority: Low  . Erectile dysfunction 05/28/2014    Priority: Low  . Basal cell carcinoma 05/28/2014    Priority: Low  . GERD (gastroesophageal reflux disease) 04/23/2014    Priority: Low  . Allergic rhinitis 04/23/2014    Priority: Low  . Glaucoma 04/23/2014    Priority: Low    Medications- reviewed and updated Current Outpatient Prescriptions  Medication Sig Dispense Refill  . acetaminophen (TYLENOL) 500 MG tablet Take 500 mg by mouth every 6 (six) hours as needed. Reported on 06/16/2015    . carvedilol (COREG) 25 MG tablet Take 0.5 tablets (12.5 mg total) by mouth 2 (two) times daily with a meal. 180 tablet 3  . chlorthalidone (HYGROTON) 25 MG tablet Take 1 tablet (25 mg total) by mouth daily. 90 tablet 3  . Cholecalciferol (VITAMIN D) 2000 UNITS CAPS Take by mouth daily.      Marland Kitchen diltiazem (CARTIA XT) 180 MG 24 hr capsule Take 1 capsule (180 mg total) by mouth 2 (two) times daily. 180 capsule 0  . dorzolamide (TRUSOPT) 2 % ophthalmic solution   2  . fexofenadine (ALLEGRA) 180 MG tablet Take 180 mg by mouth.    .  fluticasone (FLONASE) 50 MCG/ACT nasal spray Place 2 sprays into both nostrils daily. 48 g 3  . Glucosamine-Chondroit-Vit C-Mn (GLUCOSAMINE-CHONDROITIN) CAPS Take 1,500 mg by mouth.    Marland Kitchen glucose blood (FREESTYLE LITE) test strip Use to check blood sugars daily. Dx: E11.9 100 each 12  . ipratropium (ATROVENT) 0.06 % nasal spray Place 2 sprays into both nostrils 4 (four) times daily. 15 mL 1  . latanoprost (XALATAN) 0.005 % ophthalmic solution Place 1 drop into both eyes at bedtime.     Marland Kitchen levothyroxine (SYNTHROID) 50 MCG tablet Take 1 tablet (50 mcg total) by mouth daily. 90 tablet 3  . methocarbamol (ROBAXIN) 750 MG tablet Take 1 tablet (750 mg total) by mouth every 8 (eight) hours as needed for muscle spasms. 60 tablet 3  . Multiple Vitamin (MULTIVITAMIN) tablet Take 1 tablet by mouth daily.      . Omega-3 Fatty Acids (FISH OIL) 1200 MG CAPS Take by mouth daily.      Marland Kitchen omeprazole (PRILOSEC) 20 MG capsule Take 20 mg by mouth 2 (two) times daily before a meal.     . pioglitazone (ACTOS) 30 MG tablet Take 1 tablet (30 mg total) by mouth daily. 90 tablet 3  . Potassium Chloride ER 20 MEQ TBCR Take one tablet, 20 mEq daily. 90 tablet 3  . pravastatin (  PRAVACHOL) 40 MG tablet Take 1 tablet (40 mg total) by mouth daily. 90 tablet 3  . quinapril (ACCUPRIL) 40 MG tablet Take 1 tablet (40 mg total) by mouth daily. 90 tablet 3  . timolol (TIMOPTIC) 0.5 % ophthalmic solution   1  . budesonide (RHINOCORT AQUA) 32 MCG/ACT nasal spray Place 1 spray into both nostrils 2 (two) times daily. One spray each nostril bid (Patient not taking: Reported on 07/09/2015) 1 Bottle 1  . colchicine (COLCRYS) 0.6 MG tablet Take 2 tablets at first sign of gout, then once daily until attack resolves (7-10 days max) 30 tablet 2   No current facility-administered medications for this visit.    Objective: BP 124/70 mmHg  Pulse 59  Wt 208 lb (94.348 kg)  SpO2 98% Gen: NAD, resting comfortably CV: RRR no murmurs rubs or  gallops Lungs: CTAB no crackles, wheeze, rhonchi Abdomen: soft/nontender/nondistended/normal bowel sounds.  Ext: no edema Skin: warm, dry Neuro: grossly normal, moves all extremities  Assessment/Plan:  Gout S: 4 attacks in 4 years. Right great toe- mild pain and swelling for 3-4 days.  Lab Results  Component Value Date   LABURIC 9.6* 07/09/2015  A/P: GFR 50s to 60s- do not have to adjust colchicine due to this but will adjust slightly given he is on diltiazem which may increase concentrations of medicatoins. We discussed allopurinol option but he declines for now- think this would be best longterm solution and discussed crystal deposition concerns with high uric acid  Diabetes mellitus type 2, controlled S: well  controlled. On actos alone CBGs- mainly in low 100s, higher only when on prednisone Lab Results  Component Value Date   HGBA1C 6.2 02/24/2015   HGBA1C 6.3 08/25/2014   HGBA1C 6.8* 04/23/2014   A/P: update a1c today  Essential hypertension S: controlled. On coreg 12.5mg  BID, dilt 180 XL, quinapril 40mg , chlorthalidone 25mg   BP Readings from Last 3 Encounters:  07/30/15 124/70  07/21/15 186/75  07/09/15 136/72  A/P:Continue current meds:  Much improved with switch from hctz to chlorthalidone  Prostate cancer ([place]) 05/26/15 urology- PSA trending down as expected after external beam radiation  Hypothyroidism S: controlled Lab Results  Component Value Date   TSH 2.47 02/24/2015   On thyroid medication-synthroid 36mcg ROS-No hair or nail changes. No heat/cold intolerance. No constipation or diarrhea. Denies shakiness or anxiety.  A/P: check TSH today, suspect controlled     Return in about 4 months (around 11/30/2015) for physical. verbal 3-6 months Return precautions advised.   Orders Placed This Encounter  Procedures  . Hemoglobin A1c    [place]  . TSH    [place]  . Comprehensive metabolic panel    [place]    Meds ordered this encounter  Medications   . colchicine (COLCRYS) 0.6 MG tablet    Sig: Take 2 tablets at first sign of gout, then once daily until attack resolves (7-10 days max)    Dispense:  30 tablet    Refill:  2    Adjusted dose down slightly due to diltiazem    Garret Reddish, MD

## 2015-07-30 NOTE — Assessment & Plan Note (Signed)
S: controlled Lab Results  Component Value Date   TSH 2.47 02/24/2015   On thyroid medication-synthroid 53mcg ROS-No hair or nail changes. No heat/cold intolerance. No constipation or diarrhea. Denies shakiness or anxiety.  A/P: check TSH today, suspect controlled

## 2015-07-30 NOTE — Assessment & Plan Note (Signed)
S: controlled. On coreg 12.5mg  BID, dilt 180 XL, quinapril 40mg , chlorthalidone 25mg   BP Readings from Last 3 Encounters:  07/30/15 124/70  07/21/15 186/75  07/09/15 136/72  A/P:Continue current meds:  Much improved with switch from hctz to chlorthalidone

## 2015-07-30 NOTE — Assessment & Plan Note (Signed)
S: 4 attacks in 4 years. Right great toe- mild pain and swelling for 3-4 days.  Lab Results  Component Value Date   LABURIC 9.6* 07/09/2015  A/P: GFR 50s to 60s- do not have to adjust colchicine due to this but will adjust slightly given he is on diltiazem which may increase concentrations of medicatoins. We discussed allopurinol option but he declines for now- think this would be best longterm solution and discussed crystal deposition concerns with high uric acid

## 2015-08-11 ENCOUNTER — Other Ambulatory Visit: Payer: Self-pay | Admitting: *Deleted

## 2015-08-11 ENCOUNTER — Ambulatory Visit (INDEPENDENT_AMBULATORY_CARE_PROVIDER_SITE_OTHER): Payer: Medicare Other | Admitting: Family Medicine

## 2015-08-11 ENCOUNTER — Encounter: Payer: Self-pay | Admitting: Family Medicine

## 2015-08-11 ENCOUNTER — Ambulatory Visit (INDEPENDENT_AMBULATORY_CARE_PROVIDER_SITE_OTHER)
Admission: RE | Admit: 2015-08-11 | Discharge: 2015-08-11 | Disposition: A | Discharge status: Home / Self Care | Payer: Medicare Other | Source: Ambulatory Visit | Attending: Family Medicine | Admitting: Family Medicine

## 2015-08-11 VITALS — BP 120/68 | HR 64 | Temp 98.4°F | Ht 69.0 in | Wt 206.0 lb

## 2015-08-11 DIAGNOSIS — M79671 Pain in right foot: Secondary | ICD-10-CM

## 2015-08-11 DIAGNOSIS — M1A0720 Idiopathic chronic gout, left ankle and foot, without tophus (tophi): Secondary | ICD-10-CM | POA: Diagnosis not present

## 2015-08-11 MED ORDER — PREDNISONE 20 MG PO TABS: ORAL_TABLET | ORAL | Status: DC

## 2015-08-11 NOTE — Progress Notes (Signed)
Subjective:  Eddie Ferguson. Marz is a 75 y.o. year old very pleasant male patient who presents for/with See problem oriented charting ROS- no fever, chills, nausea, vomiting.see any ROS included in HPI as well.   Past Medical History-  Patient Active Problem List   Diagnosis Date Noted  . Prostate cancer ([place]) 05/28/2014    Priority: High  . Diabetes mellitus type 2, controlled ([place]) 04/23/2014    Priority: High  . Gout 07/09/2015    Priority: Medium  . Short-term memory loss 06/16/2015    Priority: Medium  . Essential hypertension 04/23/2014    Priority: Medium  . Hyperlipidemia 04/23/2014    Priority: Medium  . Hypothyroidism 04/23/2014    Priority: Medium  . Stomach ulcer     Priority: Medium  . Adenomatous colon polyp 05/28/2014    Priority: Low  . Osteoarthritis 05/28/2014    Priority: Low  . Erectile dysfunction 05/28/2014    Priority: Low  . Basal cell carcinoma 05/28/2014    Priority: Low  . GERD (gastroesophageal reflux disease) 04/23/2014    Priority: Low  . Allergic rhinitis 04/23/2014    Priority: Low  . Glaucoma 04/23/2014    Priority: Low    Medications- reviewed and updated Current Outpatient Prescriptions  Medication Sig Dispense Refill  . acetaminophen (TYLENOL) 500 MG tablet Take 500 mg by mouth every 6 (six) hours as needed. Reported on 06/16/2015    . budesonide (RHINOCORT AQUA) 32 MCG/ACT nasal spray Place 1 spray into both nostrils 2 (two) times daily. One spray each nostril bid 1 Bottle 1  . carvedilol (COREG) 25 MG tablet Take 0.5 tablets (12.5 mg total) by mouth 2 (two) times daily with a meal. 180 tablet 3  . chlorthalidone (HYGROTON) 25 MG tablet Take 1 tablet (25 mg total) by mouth daily. 90 tablet 3  . Cholecalciferol (VITAMIN D) 2000 UNITS CAPS Take by mouth daily.      . colchicine (COLCRYS) 0.6 MG tablet Take 2 tablets at first sign of gout, then once daily until attack resolves (7-10 days max) 30 tablet 2  . diltiazem (CARTIA XT) 180 MG 24  hr capsule Take 1 capsule (180 mg total) by mouth 2 (two) times daily. 180 capsule 0  . dorzolamide (TRUSOPT) 2 % ophthalmic solution   2  . fexofenadine (ALLEGRA) 180 MG tablet Take 180 mg by mouth.    . fluticasone (FLONASE) 50 MCG/ACT nasal spray Place 2 sprays into both nostrils daily. 48 g 3  . Glucosamine-Chondroit-Vit C-Mn (GLUCOSAMINE-CHONDROITIN) CAPS Take 1,500 mg by mouth.    Marland Kitchen glucose blood (FREESTYLE LITE) test strip Use to check blood sugars daily. Dx: E11.9 100 each 12  . ipratropium (ATROVENT) 0.06 % nasal spray Place 2 sprays into both nostrils 4 (four) times daily. 15 mL 1  . latanoprost (XALATAN) 0.005 % ophthalmic solution Place 1 drop into both eyes at bedtime.     Marland Kitchen levothyroxine (SYNTHROID) 50 MCG tablet Take 1 tablet (50 mcg total) by mouth daily. 90 tablet 3  . methocarbamol (ROBAXIN) 750 MG tablet Take 1 tablet (750 mg total) by mouth every 8 (eight) hours as needed for muscle spasms. 60 tablet 3  . Multiple Vitamin (MULTIVITAMIN) tablet Take 1 tablet by mouth daily.      . Omega-3 Fatty Acids (FISH OIL) 1200 MG CAPS Take by mouth daily.      Marland Kitchen omeprazole (PRILOSEC) 20 MG capsule Take 20 mg by mouth 2 (two) times daily before a meal.     .  pioglitazone (ACTOS) 30 MG tablet Take 1 tablet (30 mg total) by mouth daily. 90 tablet 3  . Potassium Chloride ER 20 MEQ TBCR Take one tablet, 20 mEq daily. 90 tablet 3  . pravastatin (PRAVACHOL) 40 MG tablet Take 1 tablet (40 mg total) by mouth daily. 90 tablet 3  . quinapril (ACCUPRIL) 40 MG tablet Take 1 tablet (40 mg total) by mouth daily. 90 tablet 3  . timolol (TIMOPTIC) 0.5 % ophthalmic solution   1  . predniSONE (DELTASONE) 20 MG tablet Take 2 pills for 2 days, 1 pill for 5 days 9 tablet 0   No current facility-administered medications for this visit.    Objective: BP 120/68 mmHg  Pulse 64  Temp(Src) 98.4 F (36.9 C) (Oral)  Ht 5\' 9"  (1.753 m)  Wt 206 lb (93.441 kg)  BMI 30.41 kg/m2 Gen: NAD, resting comfortably,  pained face when walks on right foot or with palpation CV: RRR no murmurs rubs or gallops Lungs: CTAB no crackles, wheeze, rhonchi Ext: no edema Right great toe MTP- erythematous, swollen, very tender to touch. Also tender with movement of proximal phalange of great toe Skin: warm, dry, no rash or redness except at joint as noted Neuro: grossly normal, moves all extremities  Assessment/Plan:  Gout S:patient seen 07/30/15 and had mild pain right great toe at MTP joint for 4-5 days. He had colcrys- had declined allopurinol previously. Took 4-5 days and didn't help very much. Stopped taking and then symptoms intensified. States pain with walking can be up to 9/10. Hurts all the time but primarily with palpation. MTP right great toe- warm, red.  A/P: Doubt fracture but patient is concerned not gout given lack of response to colcrys. Will send for x-rays but send in a course of prednisone to trial. If not improved on prednisone and x-ray normal get orthopedic consult.   we discussed sugars would likely go up on this therapy. With history stomach ulcer wanted to avoid nsaids though.   Return precautions advised.   Orders Placed This Encounter  Procedures  . DG Foot Complete Right    Standing Status: Future     Number of Occurrences: 1     Standing Expiration Date: 10/10/2016    Order Specific Question:  Reason for Exam (SYMPTOM  OR DIAGNOSIS REQUIRED)    Answer:  right great toe pain- MTP joint    Order Specific Question:  Preferred imaging location?    Answer:  [facility]    Meds ordered this encounter  Medications  .  predniSONE (DELTASONE) 20 MG tablet    Sig: Take 2 pills for 2 days, 1 pill for 5 days    Dispense:  9 tablet    Refill:  0    Garret Reddish, MD

## 2015-08-11 NOTE — Patient Instructions (Signed)
Get x-ray to evaluate for fracture but suspect gout  Treat with prednisone for 7 days per orders  We may need to really push for the allopurinol

## 2015-08-11 NOTE — Assessment & Plan Note (Signed)
S:patient seen 07/30/15 and had mild pain right great toe at MTP joint for 4-5 days. He had colcrys- had declined allopurinol previously. Took 4-5 days and didn't help very much. Stopped taking and then symptoms intensified. States pain with walking can be up to 9/10. Hurts all the time but primarily with palpation. MTP right great toe- warm, red.  A/P: Doubt fracture but patient is concerned not gout given lack of response to colcrys. Will send for x-rays but send in a course of prednisone to trial. If not improved on prednisone and x-ray normal get orthopedic consult.

## 2015-08-26 ENCOUNTER — Encounter: Payer: Self-pay | Admitting: Family Medicine

## 2015-08-27 ENCOUNTER — Telehealth: Payer: Self-pay | Admitting: Family Medicine

## 2015-08-27 ENCOUNTER — Other Ambulatory Visit: Payer: Self-pay | Admitting: *Deleted

## 2015-08-27 MED ORDER — PREDNISONE 20 MG PO TABS: ORAL_TABLET | ORAL | Status: DC

## 2015-08-27 NOTE — Telephone Encounter (Signed)
[facility] - Client [place] [facility] Patient Name: OSAMA SIGLEY DOB: 07-30-1940 Initial Comment Caller states husband had a prescription called in today, picked it up and the quantity is wrong. Nurse Assessment Nurse: Martyn Ehrich, RN, Fel[place] Date/Time (Eastern Time): 08/27/2015 3:44:20 PM Please select the assessment type ---Pharmacy clarification Additional Documentation ---prednisone 20 mg as a new Rx was picked up today (they communicated thru the computer and prednisone was ordered). The directions say take 2 pills for 5 d and 1 pill for 5 d and then 1/2 pill for 5 d. There are only 9 pills in the bottle. That would be 17 pills but she called pharmacy and office and office said he only wants him to have 9 pills. Denies new symptoms.Is there an on-call physician for the client? ---No Additional Documentation ---called office and reached [facility] Nurse in the office who checked on this issue - she said he is supposed to have 18 pills - so she is going to order additional 9 pills at same pharmacy [place] Notes Final Disposition User Clinical Call [place], RN, Elbert Call Id: [id number]

## 2015-08-27 NOTE — Telephone Encounter (Signed)
Spoke with Dr. Yong Channel and he advised the patient needed 9 more tablets to complete medication cycle as prescribed. Patient is aware.

## 2015-09-15 ENCOUNTER — Encounter: Payer: Self-pay | Admitting: Family Medicine

## 2015-09-16 MED ORDER — ALLOPURINOL 100 MG PO TABS: 100.0000 mg | ORAL_TABLET | Freq: Every day | ORAL | Status: DC

## 2015-10-07 ENCOUNTER — Other Ambulatory Visit: Payer: Self-pay | Admitting: Family Medicine

## 2015-10-08 ENCOUNTER — Other Ambulatory Visit: Payer: Self-pay

## 2015-10-08 MED ORDER — DILTIAZEM HCL ER COATED BEADS 180 MG PO CP24: 180.0000 mg | ORAL_CAPSULE | Freq: Two times a day (BID) | ORAL | 1 refills | Status: DC

## 2015-10-20 ENCOUNTER — Other Ambulatory Visit: Payer: Self-pay

## 2015-10-20 MED ORDER — DILTIAZEM HCL ER COATED BEADS 180 MG PO CP24: 180.0000 mg | ORAL_CAPSULE | Freq: Two times a day (BID) | ORAL | 1 refills | Status: DC

## 2015-11-11 LAB — PSA: PSA: 0.73

## 2015-11-26 ENCOUNTER — Encounter (HOSPITAL_COMMUNITY): Payer: Self-pay | Admitting: Family Medicine

## 2015-11-26 ENCOUNTER — Ambulatory Visit (HOSPITAL_COMMUNITY)
Admission: EM | Admit: 2015-11-26 | Discharge: 2015-11-26 | Disposition: A | Discharge status: Home / Self Care | Payer: Medicare Other | Attending: Physician Assistant | Admitting: Physician Assistant

## 2015-11-26 DIAGNOSIS — T162XXA Foreign body in left ear, initial encounter: Secondary | ICD-10-CM

## 2015-11-26 NOTE — ED Triage Notes (Signed)
Pt sts that he was cleaning his ear this a.m. And a piece of cotton is stuck in his ear.

## 2015-11-26 NOTE — ED Provider Notes (Signed)
CSN: [id number]     Arrival date & time 11/26/15  1027 History   First MD Initiated Contact with Patient 11/26/15 1108     Chief Complaint  Patient presents with  . cotton in ear   (Consider location/radiation/quality/duration/timing/severity/associated sxs/prior Treatment) HPI History obtained from patient:  Pt presents with the cc of:  Cotton bud Duration of symptoms: This morning Treatment prior to arrival: None Context: Attempting to clean ears into cotton bud came off in left ear Other symptoms include: Decreased hearing left ear Pain score: 0 FAMILY HISTORY: Hypertension    Past Medical History:  Diagnosis Date  . Arthritis   . Basal cell carcinoma 05/28/2014   L nasal tip. Mohs Dr. Link Snuffer.    . Blood transfusion    2010 because of stomach ulcers  . Diabetes mellitus   . Glaucoma   . Hyperlipidemia   . Hypertension   . Hypothyroidism   . Prostate cancer ([place])   . Seasonal allergies   . Sinusitis    treated for bacterial infection at least once a year at [facility]  . Stomach ulcer 2010   bleeding ulcer result NSAIDS   Past Surgical History:  Procedure Laterality Date  . BACK SURGERY    . COLONOSCOPY     2012, 5 year repeat  . ESOPHAGOGASTRODUODENOSCOPY  2010   [facility]  . LUMBAR LAMINECTOMY  2007   [place]  . RECONSTRUCTION TENDON PULLEY W/ TENDON / FASCIAL GRAFT OF HAND / FINGER  1982   rt 3rd finger  . RECONSTRUCTION TENDON PULLEY W/ TENDON / FASCIAL GRAFT OF HAND / FINGER  1953   rt 5th finger   Family History  Problem Relation Age of Onset  . Diabetes Mother   . Diabetes Father   . Hyperlipidemia Father   . Brain cancer Brother     smoker  . Colon cancer Neg Hx   . Esophageal cancer Neg Hx   . Stomach cancer Neg Hx    Social History  Substance Use Topics  . Smoking status: Former Smoker    Quit date: 02/03/2001  . Smokeless tobacco: Never Used     Comment: intermittent cigar use  . Alcohol use 0.0 oz/week     Comment: occasional    Review  of Systems  Denies: HEADACHE, NAUSEA, ABDOMINAL PAIN, CHEST PAIN, CONGESTION, DYSURIA, SHORTNESS OF BREATH  Allergies  Aspirin; Nsaids; Amoxicillin-pot clavulanate; and Sulfa antibiotics  Home Medications   Prior to Admission medications   Medication Sig Start Date End Date Taking? Authorizing Provider  acetaminophen (TYLENOL) 500 MG tablet Take 500 mg by mouth every 6 (six) hours as needed. Reported on 06/16/2015    Historical Provider, MD  allopurinol (ZYLOPRIM) 100 MG tablet Take 1 tablet (100 mg total) by mouth daily. 09/16/15   Marin Olp, MD  budesonide (RHINOCORT AQUA) 32 MCG/ACT nasal spray Place 1 spray into both nostrils 2 (two) times daily. One spray each nostril bid 06/18/15   Billy Fischer, MD  carvedilol (COREG) 25 MG tablet Take 0.5 tablets (12.5 mg total) by mouth 2 (two) times daily with a meal. 02/24/15   Marin Olp, MD  chlorthalidone (HYGROTON) 25 MG tablet Take 1 tablet (25 mg total) by mouth daily. 06/16/15   Marin Olp, MD  Cholecalciferol (VITAMIN D) 2000 UNITS CAPS Take by mouth daily.      Historical Provider, MD  colchicine (COLCRYS) 0.6 MG tablet Take 2 tablets at first sign of gout, then once daily until attack  resolves (7-10 days max) 07/30/15   Marin Olp, MD  diltiazem (CARTIA XT) 180 MG 24 hr capsule Take 1 capsule (180 mg total) by mouth 2 (two) times daily. 10/20/15   Marin Olp, MD  dorzolamide (TRUSOPT) 2 % ophthalmic solution  06/21/14   Historical Provider, MD  fexofenadine (ALLEGRA) 180 MG tablet Take 180 mg by mouth.    Historical Provider, MD  fluticasone (FLONASE) 50 MCG/ACT nasal spray Place 2 sprays into both nostrils daily. 06/05/15   Marin Olp, MD  Glucosamine-Chondroit-Vit C-Mn (GLUCOSAMINE-CHONDROITIN) CAPS Take 1,500 mg by mouth.    Historical Provider, MD  glucose blood (FREESTYLE LITE) test strip Use to check blood sugars daily. Dx: E11.9 06/16/15   Marin Olp, MD  ipratropium (ATROVENT) 0.06 % nasal spray  Place 2 sprays into both nostrils 4 (four) times daily. 07/21/15   Billy Fischer, MD  latanoprost (XALATAN) 0.005 % ophthalmic solution Place 1 drop into both eyes at bedtime.  01/21/11   Historical Provider, MD  levothyroxine (SYNTHROID) 50 MCG tablet Take 1 tablet (50 mcg total) by mouth daily. 02/24/15   Marin Olp, MD  methocarbamol (ROBAXIN) 750 MG tablet Take 1 tablet (750 mg total) by mouth every 8 (eight) hours as needed for muscle spasms. 08/25/14   Marin Olp, MD  Multiple Vitamin (MULTIVITAMIN) tablet Take 1 tablet by mouth daily.      Historical Provider, MD  Omega-3 Fatty Acids (FISH OIL) 1200 MG CAPS Take by mouth daily.      Historical Provider, MD  omeprazole (PRILOSEC) 20 MG capsule Take 20 mg by mouth 2 (two) times daily before a meal.     Historical Provider, MD  pioglitazone (ACTOS) 30 MG tablet Take 1 tablet (30 mg total) by mouth daily. 02/24/15   Marin Olp, MD  Potassium Chloride ER 20 MEQ TBCR Take one tablet, 20 mEq daily. 07/10/15   Marin Olp, MD  pravastatin (PRAVACHOL) 40 MG tablet Take 1 tablet (40 mg total) by mouth daily. 02/24/15   Marin Olp, MD  predniSONE (DELTASONE) 20 MG tablet Take 2 pills for 5 days, 1 pill for 5 days, 1/2 pill for 5 days then stop 08/27/15   Marin Olp, MD  quinapril (ACCUPRIL) 40 MG tablet Take 1 tablet (40 mg total) by mouth daily. 02/24/15   Marin Olp, MD  timolol (TIMOPTIC) 0.5 % ophthalmic solution  10/20/14   Historical Provider, MD   Meds Ordered and Administered this Visit  Medications - No data to display  BP 154/75   Pulse (!) 59   Temp 98.1 F (36.7 C)   Resp 18   SpO2 97%  No data found.   Physical Exam NURSES NOTES AND VITAL SIGNS REVIEWED. CONSTITUTIONAL: Well developed, well nourished, no acute distress HEENT: normocephalic, atraumatic, left ear there is a cotton foreign body noted against the left TM. The left EAC is clear. EYES: Conjunctiva normal NECK:normal ROM, supple, no  adenopathy PULMONARY:No respiratory distress, normal effort ABDOMINAL: Soft, ND, NT BS+, No CVAT MUSCULOSKELETAL: Normal ROM of all extremities,  SKIN: warm and dry without rash PSYCHIATRIC: Mood and affect, behavior are normal  Urgent Care Course   Clinical Course  Initially unable to remove with alligator forceps we'll attempt to flush. Was able to grasp FB with forceps after flushing. Removed without difficulty.  .Foreign Body Removal Date/Time: 11/26/2015 11:40 AM Performed by: Konrad Felix Authorized by: Konrad Felix  Consent: Verbal consent  obtained. Consent given by: patient Patient understanding: patient states understanding of the procedure being performed Patient identity confirmed: verbally with patient Time out: Immediately prior to procedure a "time out" was called to verify the correct patient, procedure, equipment, support staff and site/side marked as required. Body area: ear Location details: left ear  Sedation: Patient sedated: no Patient restrained: no Patient cooperative: yes Localization method: ENT speculum Removal mechanism: alligator forceps and irrigation Complexity: simple 1 objects recovered. Objects recovered: cotton bud Post-procedure assessment: foreign body removed Patient tolerance: Patient tolerated the procedure well with no immediate complications   (including critical care time)  Labs Review Labs Reviewed - No data to display  Imaging Review No results found.   Visual Acuity Review  Right Eye Distance:   Left Eye Distance:   Bilateral Distance:    Right Eye Near:   Left Eye Near:    Bilateral Near:         MDM   1. Foreign body in left ear, initial encounter     Patient is reassured that there are no issues that require transfer to higher level of care at this time or additional tests. Patient is advised to continue home symptomatic treatment. Patient is advised that if there are new or worsening symptoms to  attend the emergency department, contact primary care provider, or return to UC. Instructions of care provided discharged home in stable condition.    THIS NOTE WAS GENERATED USING A VOICE RECOGNITION SOFTWARE PROGRAM. ALL REASONABLE EFFORTS  WERE MADE TO PROOFREAD THIS DOCUMENT FOR ACCURACY.  I have verbally reviewed the discharge instructions with the patient. A printed AVS was given to the patient.  All questions were answered prior to discharge.      Konrad Felix, PA 11/26/15 1141

## 2015-12-16 ENCOUNTER — Encounter: Payer: Self-pay | Admitting: Internal Medicine

## 2015-12-16 ENCOUNTER — Ambulatory Visit (INDEPENDENT_AMBULATORY_CARE_PROVIDER_SITE_OTHER): Payer: Medicare Other | Admitting: Family Medicine

## 2015-12-16 ENCOUNTER — Encounter: Payer: Self-pay | Admitting: Family Medicine

## 2015-12-16 VITALS — BP 162/86 | HR 62 | Temp 98.2°F | Ht 68.0 in | Wt 213.6 lb

## 2015-12-16 DIAGNOSIS — E119 Type 2 diabetes mellitus without complications: Secondary | ICD-10-CM | POA: Diagnosis not present

## 2015-12-16 DIAGNOSIS — Z0000 Encounter for general adult medical examination without abnormal findings: Secondary | ICD-10-CM | POA: Diagnosis not present

## 2015-12-16 DIAGNOSIS — M1A0720 Idiopathic chronic gout, left ankle and foot, without tophus (tophi): Secondary | ICD-10-CM | POA: Diagnosis not present

## 2015-12-16 DIAGNOSIS — E785 Hyperlipidemia, unspecified: Secondary | ICD-10-CM

## 2015-12-16 DIAGNOSIS — D126 Benign neoplasm of colon, unspecified: Secondary | ICD-10-CM | POA: Diagnosis not present

## 2015-12-16 DIAGNOSIS — I10 Essential (primary) hypertension: Secondary | ICD-10-CM

## 2015-12-16 DIAGNOSIS — E034 Atrophy of thyroid (acquired): Secondary | ICD-10-CM | POA: Diagnosis not present

## 2015-12-16 LAB — COMPREHENSIVE METABOLIC PANEL
ALBUMIN: 4 g/dL (ref 3.5–5.2)
ALK PHOS: 55 U/L (ref 39–117)
ALT: 13 U/L (ref 0–53)
AST: 15 U/L (ref 0–37)
BILIRUBIN TOTAL: 0.5 mg/dL (ref 0.2–1.2)
BUN: 12 mg/dL (ref 6–23)
CO2: 30 mEq/L (ref 19–32)
Calcium: 9.2 mg/dL (ref 8.4–10.5)
Chloride: 104 mEq/L (ref 96–112)
Creatinine, Ser: 1.21 mg/dL (ref 0.40–1.50)
GFR: 61.71 mL/min (ref >60.00)
GLUCOSE: 122 mg/dL — AB (ref 70–99)
POTASSIUM: 3.9 meq/L (ref 3.5–5.1)
SODIUM: 142 meq/L (ref 135–145)
TOTAL PROTEIN: 6.4 g/dL (ref 6.0–8.3)

## 2015-12-16 LAB — CBC
HEMATOCRIT: 39.8 % (ref 39.0–52.0)
HEMOGLOBIN: 13.4 g/dL (ref 13.0–17.0)
MCHC: 33.7 g/dL (ref 30.0–36.0)
MCV: 90 fl (ref 78.0–100.0)
PLATELETS: 218 10*3/uL (ref 150.0–400.0)
RBC: 4.42 Mil/uL (ref 4.22–5.81)
RDW: 13.7 % (ref 11.5–15.5)
WBC: 6.1 10*3/uL (ref 4.0–10.5)

## 2015-12-16 LAB — LIPID PANEL
CHOLESTEROL: 177 mg/dL (ref 0–200)
HDL: 61 mg/dL (ref >39.00)
LDL Cholesterol: 95 mg/dL (ref 0–99)
NONHDL: 116.05
Total CHOL/HDL Ratio: 3
Triglycerides: 105 mg/dL (ref 0.0–149.0)
VLDL: 21 mg/dL (ref 0.0–40.0)

## 2015-12-16 LAB — TSH: TSH: 3.32 u[IU]/mL (ref 0.35–4.50)

## 2015-12-16 LAB — URIC ACID: Uric Acid, Serum: 6.5 mg/dL (ref 4.0–7.8)

## 2015-12-16 LAB — HEMOGLOBIN A1C: HEMOGLOBIN A1C: 6.1 % (ref 4.6–6.5)

## 2015-12-16 NOTE — Progress Notes (Signed)
Pre visit review using our clinic review tool, if applicable. No additional management support is needed unless otherwise documented below in the visit note. 

## 2015-12-16 NOTE — Progress Notes (Signed)
Phone: [phone]  Subjective:  Patient presents today for their annual physical. Chief complaint-noted.   See problem oriented charting- ROS- full  review of systems was completed and negative except for: fatigue when on HCTZ. No chest pain or shortness of breath. No headache or blurry vision.   The following were reviewed and entered/updated in epic: Past Medical History:  Diagnosis Date  . Arthritis   . Basal cell carcinoma 05/28/2014   L nasal tip. Mohs Dr. Link Snuffer.    . Blood transfusion    2010 because of stomach ulcers  . Diabetes mellitus   . Glaucoma   . Hyperlipidemia   . Hypertension   . Hypothyroidism   . Prostate cancer ([place])   . Seasonal allergies   . Sinusitis    treated for bacterial infection at least once a year at [facility]  . Stomach ulcer 2010   bleeding ulcer result NSAIDS   Patient Active Problem List   Diagnosis Date Noted  . Prostate cancer ([place]) 05/28/2014    Priority: High  . Diabetes mellitus type 2, controlled ([place]) 04/23/2014    Priority: High  . Gout 07/09/2015    Priority: Medium  . Short-term memory loss 06/16/2015    Priority: Medium  . Essential hypertension 04/23/2014    Priority: Medium  . Hyperlipidemia 04/23/2014    Priority: Medium  . Hypothyroidism 04/23/2014    Priority: Medium  . Stomach ulcer     Priority: Medium  . Adenomatous colon polyp 05/28/2014    Priority: Low  . Osteoarthritis 05/28/2014    Priority: Low  . Erectile dysfunction 05/28/2014    Priority: Low  . Basal cell carcinoma 05/28/2014    Priority: Low  . GERD (gastroesophageal reflux disease) 04/23/2014    Priority: Low  . Allergic rhinitis 04/23/2014    Priority: Low  . Glaucoma 04/23/2014    Priority: Low   Past Surgical History:  Procedure Laterality Date  . BACK SURGERY    . COLONOSCOPY     2012, 5 year repeat  . ESOPHAGOGASTRODUODENOSCOPY  2010   [facility]  . LUMBAR LAMINECTOMY  2007   [place]  . RECONSTRUCTION TENDON PULLEY W/ TENDON /  FASCIAL GRAFT OF HAND / FINGER  1982   rt 3rd finger  . RECONSTRUCTION TENDON PULLEY W/ TENDON / FASCIAL GRAFT OF HAND / FINGER  1953   rt 5th finger    Family History  Problem Relation Age of Onset  . Diabetes Mother   . Diabetes Father   . Hyperlipidemia Father   . Brain cancer Brother     smoker  . Colon cancer Neg Hx   . Esophageal cancer Neg Hx   . Stomach cancer Neg Hx     Medications- reviewed and updated Current Outpatient Prescriptions  Medication Sig Dispense Refill  . acetaminophen (TYLENOL) 500 MG tablet Take 500 mg by mouth every 6 (six) hours as needed. Reported on 06/16/2015    . allopurinol (ZYLOPRIM) 100 MG tablet Take 1 tablet (100 mg total) by mouth daily. 90 tablet 3  . carvedilol (COREG) 25 MG tablet Take 0.5 tablets (12.5 mg total) by mouth 2 (two) times daily with a meal. 180 tablet 3  . Cholecalciferol (VITAMIN D) 2000 UNITS CAPS Take by mouth daily.      . colchicine (COLCRYS) 0.6 MG tablet Take 2 tablets at first sign of gout, then once daily until attack resolves (7-10 days max) 30 tablet 2  . diltiazem (CARTIA XT) 180 MG 24 hr capsule  Take 1 capsule (180 mg total) by mouth 2 (two) times daily. 180 capsule 1  . dorzolamide (TRUSOPT) 2 % ophthalmic solution   2  . fexofenadine (ALLEGRA) 180 MG tablet Take 180 mg by mouth.    . fluticasone (FLONASE) 50 MCG/ACT nasal spray Place 2 sprays into both nostrils daily. 48 g 3  . Glucosamine-Chondroit-Vit C-Mn (GLUCOSAMINE-CHONDROITIN) CAPS Take 1,500 mg by mouth.    Marland Kitchen glucose blood (FREESTYLE LITE) test strip Use to check blood sugars daily. Dx: E11.9 100 each 12  . latanoprost (XALATAN) 0.005 % ophthalmic solution Place 1 drop into both eyes at bedtime.     Marland Kitchen levothyroxine (SYNTHROID) 50 MCG tablet Take 1 tablet (50 mcg total) by mouth daily. 90 tablet 3  . methocarbamol (ROBAXIN) 750 MG tablet Take 1 tablet (750 mg total) by mouth every 8 (eight) hours as needed for muscle spasms. 60 tablet 3  . Multiple Vitamin  (MULTIVITAMIN) tablet Take 1 tablet by mouth daily.      . Omega-3 Fatty Acids (FISH OIL) 1200 MG CAPS Take by mouth daily.      Marland Kitchen omeprazole (PRILOSEC) 20 MG capsule Take 20 mg by mouth 2 (two) times daily before a meal.     . pioglitazone (ACTOS) 30 MG tablet Take 1 tablet (30 mg total) by mouth daily. 90 tablet 3  . Potassium Chloride ER 20 MEQ TBCR Take one tablet, 20 mEq daily. 90 tablet 3  . pravastatin (PRAVACHOL) 40 MG tablet Take 1 tablet (40 mg total) by mouth daily. 90 tablet 3  . quinapril (ACCUPRIL) 40 MG tablet Take 1 tablet (40 mg total) by mouth daily. 90 tablet 3  . timolol (TIMOPTIC) 0.5 % ophthalmic solution   1  . chlorthalidone (HYGROTON) 25 MG tablet Take 1 tablet (25 mg total) by mouth daily. (Patient not taking: Reported on 12/16/2015) 90 tablet 3   No current facility-administered medications for this visit.     Allergies-reviewed and updated Allergies  Allergen Reactions  . Aspirin Other (See Comments)    Stomach ulcers  . Nsaids Other (See Comments)    Stomach ulcers  . Amoxicillin-Pot Clavulanate Rash  . Sulfa Antibiotics Swelling and Rash    Social History   Social History  . Marital status: Married    Spouse name: N/A  . Number of children: N/A  . Years of education: N/A   Social History Main Topics  . Smoking status: Former Smoker    Quit date: 02/03/2001  . Smokeless tobacco: Never Used     Comment: intermittent cigar use  . Alcohol use 0.0 oz/week     Comment: occasional  . Drug use: No  . Sexual activity: Not Asked   Other Topics Concern  . None   Social History Narrative   Married (wife patient outside Network engineer). 2 children. 5 grandkids.       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       Hobbies: travel, enjoys going to shows, [employer] women's basketball tournament. Doesn't watch tv.     Objective: BP (!) 162/86   Pulse 62   Temp 98.2 F (36.8 C) (Oral)   Ht 5\' 8"  (1.727 m)   Wt 213 lb 9.6 oz (96.9 kg)   SpO2 95%   BMI  32.48 kg/m  Gen: NAD, resting comfortably HEENT: Mucous membranes are moist. Oropharynx normal Neck: no thyromegaly CV: RRR no murmurs rubs or gallops Lungs: CTAB no crackles, wheeze, rhonchi Abdomen: soft/nontender/nondistended/normal bowel sounds. No rebound or guarding.  Umbilical hernia- easily reducible Ext: trace edema Skin: warm, dry Neuro: grossly normal, moves all extremities, PERRLA  Defers prostate exam to urologist  Assessment/Plan:  75 y.o. male presenting for annual physical.  Health Maintenance counseling: 1. Anticipatory guidance: Patient counseled regarding regular dental exams- sees every 6 months, eye exams- sees every 6 months, wearing seatbelts.  2. Risk factor reduction:  Advised patient of need for regular exercise and diet rich and fruits and vegetables to reduce risk of heart attack and stroke. Weight up 7 lbs from last visit. Not exercising- advised 3. Immunizations/screenings/ancillary studies Immunization History  Administered Date(s) Administered  . Hepatitis B 06/19/2007, 11/16/2007, 04/18/2008  . Influenza,inj,Quad PF,36+ Mos 02/24/2015  . Influenza-Unspecified 01/27/2012, 03/22/2013, 01/21/2014  . Meningococcal Polysaccharide 12/19/2008  . Pneumococcal Conjugate-13 08/25/2014  . Td 04/23/2014  . Tdap 03/07/2001   Health Maintenance Due  Topic Date Due  . PNA vac Low Risk Adult (2 of 2 - PPSV23)- will return for nurse visit for ths- going on trip 08/25/2015  . INFLUENZA VACCINE - same as above 10/06/2015   4. Prostate cancer followed by urology- see below  5. Colon cancer screening - 2012 with tubular adenoma- 5 year repeat advised- refer back today 6. Skin cancer screening- sees yearly septemberDr. Link Snuffer removed basal cell on nose before  Status of chronic or acute concerns   GOUt- has declined uric acid lowering agent despite uric acid 9.6 and gout attacks yearly- started allopurinol in July - has had 1 gout flare around that time and none  since. Only on 100mg - get uric acid   DIabetes- controlled on actos alone. Sugars at home in low 100s fasting. Was getting into 70s and 80s and stopped 2 months Lab Results  Component Value Date   HGBA1C 6.5 07/30/2015   HTN- - controlled poorly on coreg 12.5mg  BID, dilt 180mg  XL, quinapril 40mg . Stopped chlorthalidone 25mg  as well as hctz complaining of fatigue. Did not take meds this AM.  BP Readings from Last 3 Encounters:  12/16/15 (!) 162/86  11/26/15 154/75  08/11/15 120/68  go home take meds- start back 1/2 tablet of hctz or chlorthalidone with return 1 week  Prostate cancer- following with urology- PSA down to 0.73 most recently in September. Dr. Tresa Moore following every 6 months  Hypothyroidism- has been controlled on synthroid 50 mcg.   HLD- controlled on pravastatin 40mg - update lipids  No problem-specific Assessment & Plan notes found for this encounter.   No Follow-up on file.  No orders of the defined types were placed in this encounter.   No orders of the defined types were placed in this encounter.   Return precautions advised.   Garret Reddish, MD

## 2015-12-16 NOTE — Patient Instructions (Addendum)
Labs before you leave  High blood pressure- start back either 1/2 tablet of chlorthalidone or HCTZ and follow up in about a week for BP recheck. Can use same day slot for check out desk- prefer afternoon appointment and definitely take your meds before coming in. If this causes fatigue at lower levels I will try amlodipine instead of diltiazem as next step  We will call you within a week about your referral to GI. If you do not hear within 2 weeks, give [place] a call.   I would also like for you to sign up for an annual wellness visit with our nurse, Manuela Schwartz, who specializes in the annual wellness exam. This is a free benefit under medicare that may help [place] find additional ways to help you. Some highlights are reviewing medications, lifestyle, and doing a dementia screen.

## 2015-12-21 ENCOUNTER — Other Ambulatory Visit: Payer: Self-pay | Admitting: Family Medicine

## 2015-12-21 ENCOUNTER — Ambulatory Visit (INDEPENDENT_AMBULATORY_CARE_PROVIDER_SITE_OTHER): Payer: Medicare Other | Admitting: Family Medicine

## 2015-12-21 DIAGNOSIS — Z23 Encounter for immunization: Secondary | ICD-10-CM

## 2016-01-05 ENCOUNTER — Encounter: Payer: Self-pay | Admitting: Internal Medicine

## 2016-01-22 LAB — HM DIABETES EYE EXAM

## 2016-01-26 ENCOUNTER — Encounter: Payer: Self-pay | Admitting: Family Medicine

## 2016-01-27 ENCOUNTER — Telehealth: Payer: Self-pay

## 2016-01-27 ENCOUNTER — Ambulatory Visit (AMBULATORY_SURGERY_CENTER): Payer: Self-pay | Admitting: *Deleted

## 2016-01-27 ENCOUNTER — Telehealth: Payer: Self-pay | Admitting: Internal Medicine

## 2016-01-27 VITALS — Ht 68.0 in | Wt 214.0 lb

## 2016-01-27 DIAGNOSIS — Z86010 Personal history of colonic polyps: Secondary | ICD-10-CM

## 2016-01-27 MED ORDER — NA SULFATE-K SULFATE-MG SULF 17.5-3.13-1.6 GM/177ML PO SOLN: 1.0000 | Freq: Once | ORAL | 0 refills | Status: AC

## 2016-01-27 NOTE — Telephone Encounter (Signed)
Did you offer the $50 voucher?

## 2016-01-27 NOTE — Progress Notes (Signed)
No egg or soy allergy known to patient  No issues with past sedation with any surgeries  or procedures, no intubation problems  No diet pills per patient No home 02 use per patient  No blood thinners per patient  Pt denies issues with constipation  No A fib or A flutter    With wife in [place] today  

## 2016-01-27 NOTE — Telephone Encounter (Signed)
Eddie Ferguson,      Pt's procedure sch for 12/19.  Can not afford prep.  75yo.  May he please have a sample if one is available?                                                                                 Thank you, Angela/PV

## 2016-02-02 NOTE — Telephone Encounter (Signed)
Called voucher #'s into [facility] at [place].  It was accepted and pt should not have to pay more than $50 for Suprep.  Pt called and message left on voicemail.                                                                        Eddie Ferguson/PV

## 2016-02-11 ENCOUNTER — Encounter: Payer: Self-pay | Admitting: Internal Medicine

## 2016-02-23 ENCOUNTER — Encounter: Payer: Medicare Other | Admitting: Internal Medicine

## 2016-03-18 ENCOUNTER — Ambulatory Visit (INDEPENDENT_AMBULATORY_CARE_PROVIDER_SITE_OTHER): Payer: Medicare HMO | Admitting: Family Medicine

## 2016-03-18 ENCOUNTER — Encounter: Payer: Self-pay | Admitting: Family Medicine

## 2016-03-18 VITALS — BP 142/78 | HR 64 | Temp 98.0°F | Ht 68.0 in | Wt 218.2 lb

## 2016-03-18 DIAGNOSIS — E034 Atrophy of thyroid (acquired): Secondary | ICD-10-CM

## 2016-03-18 DIAGNOSIS — E785 Hyperlipidemia, unspecified: Secondary | ICD-10-CM | POA: Diagnosis not present

## 2016-03-18 DIAGNOSIS — I10 Essential (primary) hypertension: Secondary | ICD-10-CM | POA: Diagnosis not present

## 2016-03-18 DIAGNOSIS — C61 Malignant neoplasm of prostate: Secondary | ICD-10-CM

## 2016-03-18 DIAGNOSIS — E119 Type 2 diabetes mellitus without complications: Secondary | ICD-10-CM

## 2016-03-18 LAB — BASIC METABOLIC PANEL
BUN: 14 mg/dL (ref 6–23)
CALCIUM: 9.2 mg/dL (ref 8.4–10.5)
CHLORIDE: 102 meq/L (ref 96–112)
CO2: 36 meq/L — AB (ref 19–32)
CREATININE: 1.23 mg/dL (ref 0.40–1.50)
GFR: 60.52 mL/min (ref >60.00)
GLUCOSE: 108 mg/dL — AB (ref 70–99)
Potassium: 4.2 mEq/L (ref 3.5–5.1)
Sodium: 142 mEq/L (ref 135–145)

## 2016-03-18 LAB — HEMOGLOBIN A1C: HEMOGLOBIN A1C: 6.2 % (ref 4.6–6.5)

## 2016-03-18 MED ORDER — HYDROCHLOROTHIAZIDE 25 MG PO TABS: 25.0000 mg | ORAL_TABLET | Freq: Every day | ORAL | 3 refills | Status: DC

## 2016-03-18 MED ORDER — DILTIAZEM HCL ER COATED BEADS 180 MG PO CP24: 180.0000 mg | ORAL_CAPSULE | Freq: Two times a day (BID) | ORAL | 3 refills | Status: DC

## 2016-03-18 NOTE — Progress Notes (Signed)
Pre visit review using our clinic review tool, if applicable. No additional management support is needed unless otherwise documented below in the visit note. 

## 2016-03-18 NOTE — Progress Notes (Addendum)
Subjective:  Eddie Ferguson is a 76 y.o. year old very pleasant male patient who presents for/with See problem oriented charting ROS- No chest pain or shortness of breath. No headache or blurry vision.  Some forgetfulness largely stable   Past Medical History-  Patient Active Problem List   Diagnosis Date Noted  . Prostate cancer ([place]) 05/28/2014    Priority: High  . Diabetes mellitus type 2, controlled ([place]) 04/23/2014    Priority: High  . Gout 07/09/2015    Priority: Medium  . Short-term memory loss 06/16/2015    Priority: Medium  . Essential hypertension 04/23/2014    Priority: Medium  . Hyperlipidemia 04/23/2014    Priority: Medium  . Hypothyroidism 04/23/2014    Priority: Medium  . Stomach ulcer     Priority: Medium  . Adenomatous colon polyp 05/28/2014    Priority: Low  . Osteoarthritis 05/28/2014    Priority: Low  . Erectile dysfunction 05/28/2014    Priority: Low  . Basal cell carcinoma 05/28/2014    Priority: Low  . GERD (gastroesophageal reflux disease) 04/23/2014    Priority: Low  . Allergic rhinitis 04/23/2014    Priority: Low  . Glaucoma 04/23/2014    Priority: Low    Medications- reviewed and updated Current Outpatient Prescriptions  Medication Sig Dispense Refill  . acetaminophen (TYLENOL) 500 MG tablet Take 500 mg by mouth every 6 (six) hours as needed. Reported on 06/16/2015    . allopurinol (ZYLOPRIM) 100 MG tablet Take 1 tablet (100 mg total) by mouth daily. 90 tablet 3  . carvedilol (COREG) 25 MG tablet Take 0.5 tablets (12.5 mg total) by mouth 2 (two) times daily with a meal. 180 tablet 3  . Cholecalciferol (VITAMIN D) 2000 UNITS CAPS Take by mouth daily.      . colchicine (COLCRYS) 0.6 MG tablet Take 2 tablets at first sign of gout, then once daily until attack resolves (7-10 days max) 30 tablet 2  . diltiazem (CARTIA XT) 180 MG 24 hr capsule Take 1 capsule (180 mg total) by mouth 2 (two) times daily. 180 capsule 3  . dorzolamide (TRUSOPT) 2 %  ophthalmic solution   2  . fexofenadine (ALLEGRA) 180 MG tablet Take 180 mg by mouth.    . fluticasone (FLONASE) 50 MCG/ACT nasal spray Place 2 sprays into both nostrils daily. 48 g 3  . Glucosamine-Chondroit-Vit C-Mn (GLUCOSAMINE-CHONDROITIN) CAPS Take 1,500 mg by mouth.    Marland Kitchen glucose blood (FREESTYLE LITE) test strip Use to check blood sugars daily. Dx: E11.9 100 each 12  . latanoprost (XALATAN) 0.005 % ophthalmic solution Place 1 drop into both eyes at bedtime.     Marland Kitchen levothyroxine (SYNTHROID) 50 MCG tablet Take 1 tablet (50 mcg total) by mouth daily. 90 tablet 3  . methocarbamol (ROBAXIN) 750 MG tablet Take 1 tablet (750 mg total) by mouth every 8 (eight) hours as needed for muscle spasms. 60 tablet 3  . Multiple Vitamin (MULTIVITAMIN) tablet Take 1 tablet by mouth daily.      . Omega-3 Fatty Acids (FISH OIL) 1200 MG CAPS Take by mouth daily.      Marland Kitchen omeprazole (PRILOSEC) 20 MG capsule Take 20 mg by mouth 2 (two) times daily before a meal.     . Potassium Chloride ER 20 MEQ TBCR Take one tablet, 20 mEq daily. 90 tablet 3  . pravastatin (PRAVACHOL) 40 MG tablet Take 1 tablet (40 mg total) by mouth daily. 90 tablet 3  . quinapril (ACCUPRIL) 40 MG tablet  Take 1 tablet (40 mg total) by mouth daily. 90 tablet 3  . timolol (TIMOPTIC) 0.5 % ophthalmic solution   1  . hydrochlorothiazide (HYDRODIURIL) 12.5 MG tablet Take 1 tablet (12.5 mg total) by mouth daily. 90 tablet 3   No current facility-administered medications for this visit.     Objective: BP (!) 142/78   Pulse 64   Temp 98 F (36.7 C) (Oral)   Ht 5\' 8"  (1.727 m)   Wt 218 lb 3.2 oz (99 kg)   SpO2 92%   BMI 33.18 kg/m   Initial BP normal, high on repeat Gen: NAD, resting comfortably CV: RRR no murmurs rubs or gallops Lungs: CTAB no crackles, wheeze, rhonchi Abdomen: soft/nontender/nondistended/normal bowel sounds. No rebound or guarding.  Ext: no edema Skin: warm, dry, no rash  Assessment/Plan:  Diabetes mellitus type 2,  controlled ([place]) S: well controlled. On actos alone previously- stopped a month ago CBGs- 106 average over laast month- stopped actos a month ago Lab Results  Component Value Date   HGBA1C 6.2 03/18/2016   HGBA1C 6.1 12/16/2015   HGBA1C 6.5 07/30/2015   A/P: a1c looks great- repeat within 4-6 months and as long as under 7- remain off medication  Hyperlipidemia S: reasonably controlled on pravastatin 40mg  with LDL <100 but could push for under 70 with diabetes. No myalgias.  Lab Results  Component Value Date   CHOL 177 12/16/2015   HDL 61.00 12/16/2015   LDLCALC 95 12/16/2015   LDLDIRECT 91.0 04/23/2014   TRIG 105.0 12/16/2015   CHOLHDL 3 12/16/2015   A/P: continue current medication  Hypothyroidism S: hypothyroidism controlled with synthroid 54mcg Lab Results  Component Value Date   TSH 3.32 12/16/2015  A/P: no changes  Essential hypertension S: controlled on coreg 12.5mg  BID, dilt 180mg  XL BID, quinapril 40, hctz 12.5mg . 145/70 average at home.   BP Readings from Last 3 Encounters:  03/18/16 (!) 142/78  12/16/15 (!) 162/86  11/26/15 154/75  A/P:Continue current meds:  But increase to 25mg  at night of hctz. Will come in for AWV within about 3 weeks and Wynetta Fines, RN will recheck BP- will decide if change has been adequate. Could also change diltiazem to amlodipine potentially.  Prostate cancer [facility]) S: following with [facility] A/P: external beam radiation as treatment after active surveillance for some time. He will keep regular follow up with them.   awv 3 weeks will allow MMSE repeat (28/30 last year) and BP recheck. If worsening MMSE start reversible cause of memory loss workup  Orders Placed This Encounter  Procedures  . Hemoglobin A1c    [place]  . Basic metabolic panel    [place]   Roselyn Reef is to call in 90 day of all other medications.  Meds ordered this encounter  Medications  . diltiazem (CARTIA XT) 180 MG 24 hr capsule    Sig: Take 1 capsule (180  mg total) by mouth 2 (two) times daily.    Dispense:  180 capsule    Refill:  3  . hydrochlorothiazide (HYDRODIURIL) 25 MG tablet    Sig: Take 1 tablet (25 mg total) by mouth daily.    Dispense:  90 tablet    Refill:  3    Return precautions advised.  Garret Reddish, MD

## 2016-03-18 NOTE — Patient Instructions (Addendum)
Labs before you leave  Roselyn Reef will refill all meds 90 days  Blood pressure higher than desired- will increase hctz from 12.5mg  to 25mg  can continue at night  I would also like for you to sign up for an annual wellness visit with our nurse, Manuela Schwartz, with 3 weeks but at least a week from now who specializes in the annual wellness exam. This is a free benefit under medicare that may help [place] find additional ways to help you. Some highlights are reviewing medications, lifestyle, and doing a dementia screen.

## 2016-03-19 NOTE — Assessment & Plan Note (Signed)
S: reasonably controlled on pravastatin 40mg  with LDL <100 but could push for under 70 with diabetes. No myalgias.  Lab Results  Component Value Date   CHOL 177 12/16/2015   HDL 61.00 12/16/2015   LDLCALC 95 12/16/2015   LDLDIRECT 91.0 04/23/2014   TRIG 105.0 12/16/2015   CHOLHDL 3 12/16/2015   A/P: continue current medication

## 2016-03-19 NOTE — Assessment & Plan Note (Signed)
S: hypothyroidism controlled with synthroid 47mcg Lab Results  Component Value Date   TSH 3.32 12/16/2015  A/P: no changes

## 2016-03-19 NOTE — Assessment & Plan Note (Signed)
S: controlled on coreg 12.5mg  BID, dilt 180mg  XL BID, quinapril 40, hctz 12.5mg . 145/70 average at home.   BP Readings from Last 3 Encounters:  03/18/16 (!) 142/78  12/16/15 (!) 162/86  11/26/15 154/75  A/P:Continue current meds:  But increase to 25mg  at night of hctz. Will come in for AWV within about 3 weeks and Wynetta Fines, RN will recheck BP- will decide if change has been adequate. Could also change diltiazem to amlodipine potentially.

## 2016-03-19 NOTE — Assessment & Plan Note (Signed)
S: well controlled. On actos alone previously- stopped a month ago CBGs- 106 average over laast month- stopped actos a month ago Lab Results  Component Value Date   HGBA1C 6.2 03/18/2016   HGBA1C 6.1 12/16/2015   HGBA1C 6.5 07/30/2015   A/P: a1c looks great- repeat within 4-6 months and as long as under 7- remain off medication

## 2016-03-19 NOTE — Assessment & Plan Note (Signed)
S: following with [facility] A/P: external beam radiation as treatment after active surveillance for some time. He will keep regular follow up with them.

## 2016-03-21 ENCOUNTER — Encounter: Payer: Self-pay | Admitting: Family Medicine

## 2016-03-22 ENCOUNTER — Other Ambulatory Visit: Payer: Self-pay

## 2016-03-22 MED ORDER — FLUTICASONE PROPIONATE 50 MCG/ACT NA SUSP: 2.0000 | Freq: Every day | NASAL | 3 refills | Status: DC

## 2016-03-22 MED ORDER — QUINAPRIL HCL 40 MG PO TABS
40.0000 mg | ORAL_TABLET | Freq: Every day | ORAL | 3 refills | Status: DC
Start: 2016-03-22 — End: 2017-03-14

## 2016-03-22 MED ORDER — CARVEDILOL 25 MG PO TABS: 12.5000 mg | ORAL_TABLET | Freq: Two times a day (BID) | ORAL | 3 refills | Status: DC

## 2016-03-22 MED ORDER — POTASSIUM CHLORIDE ER 20 MEQ PO TBCR: EXTENDED_RELEASE_TABLET | ORAL | 3 refills | Status: DC

## 2016-03-22 MED ORDER — LEVOTHYROXINE SODIUM 50 MCG PO TABS: 50.0000 ug | ORAL_TABLET | Freq: Every day | ORAL | 3 refills | Status: DC

## 2016-03-22 MED ORDER — LEVOTHYROXINE SODIUM 50 MCG PO TABS
50.0000 ug | ORAL_TABLET | Freq: Every day | ORAL | 3 refills | Status: DC
Start: 2016-03-22 — End: 2017-04-25

## 2016-03-22 MED ORDER — PRAVASTATIN SODIUM 40 MG PO TABS
40.0000 mg | ORAL_TABLET | Freq: Every day | ORAL | 3 refills | Status: DC
Start: 2016-03-22 — End: 2016-03-22

## 2016-03-22 MED ORDER — ALLOPURINOL 100 MG PO TABS
100.0000 mg | ORAL_TABLET | Freq: Every day | ORAL | 3 refills | Status: DC
Start: 2016-03-22 — End: 2016-08-24

## 2016-03-22 MED ORDER — PRAVASTATIN SODIUM 40 MG PO TABS
40.0000 mg | ORAL_TABLET | Freq: Every day | ORAL | 3 refills | Status: DC
Start: 2016-03-22 — End: 2017-03-14

## 2016-03-22 MED ORDER — QUINAPRIL HCL 40 MG PO TABS
40.0000 mg | ORAL_TABLET | Freq: Every day | ORAL | 3 refills | Status: DC
Start: 2016-03-22 — End: 2016-03-22

## 2016-03-22 NOTE — Progress Notes (Signed)
Prescriptions sent to [facility]. 90 day supply

## 2016-04-13 ENCOUNTER — Encounter: Payer: Self-pay | Admitting: Internal Medicine

## 2016-04-13 ENCOUNTER — Ambulatory Visit (AMBULATORY_SURGERY_CENTER): Payer: Medicare HMO | Admitting: Internal Medicine

## 2016-04-13 VITALS — BP 134/59 | HR 56 | Temp 96.2°F | Resp 12 | Ht 68.0 in | Wt 214.0 lb

## 2016-04-13 DIAGNOSIS — D123 Benign neoplasm of transverse colon: Secondary | ICD-10-CM | POA: Diagnosis not present

## 2016-04-13 DIAGNOSIS — D125 Benign neoplasm of sigmoid colon: Secondary | ICD-10-CM

## 2016-04-13 DIAGNOSIS — K635 Polyp of colon: Secondary | ICD-10-CM | POA: Diagnosis not present

## 2016-04-13 DIAGNOSIS — D122 Benign neoplasm of ascending colon: Secondary | ICD-10-CM

## 2016-04-13 DIAGNOSIS — D120 Benign neoplasm of cecum: Secondary | ICD-10-CM

## 2016-04-13 DIAGNOSIS — K219 Gastro-esophageal reflux disease without esophagitis: Secondary | ICD-10-CM | POA: Diagnosis not present

## 2016-04-13 DIAGNOSIS — Z86010 Personal history of colonic polyps: Secondary | ICD-10-CM | POA: Diagnosis present

## 2016-04-13 DIAGNOSIS — E119 Type 2 diabetes mellitus without complications: Secondary | ICD-10-CM | POA: Diagnosis not present

## 2016-04-13 MED ORDER — SODIUM CHLORIDE 0.9 % IV SOLN: 500.0000 mL | INTRAVENOUS | Status: DC

## 2016-04-13 NOTE — Patient Instructions (Addendum)
YOU HAD AN ENDOSCOPIC PROCEDURE TODAY AT [facility]:   Refer to the procedure report that was given to you for any specific questions about what was found during the examination.  If the procedure report does not answer your questions, please call your gastroenterologist to clarify.  If you requested that your care partner not be given the details of your procedure findings, then the procedure report has been included in a sealed envelope for you to review at your convenience later.  YOU SHOULD EXPECT: Some feelings of bloating in the abdomen. Passage of more gas than usual.  Walking can help get rid of the air that was put into your GI tract during the procedure and reduce the bloating. If you had a lower endoscopy (such as a colonoscopy or flexible sigmoidoscopy) you may notice spotting of blood in your stool or on the toilet paper. If you underwent a bowel prep for your procedure, you may not have a normal bowel movement for a few days.  Please Note:  You might notice some irritation and congestion in your nose or some drainage.  This is from the oxygen used during your procedure.  There is no need for concern and it should clear up in a day or so.  SYMPTOMS TO REPORT IMMEDIATELY:   Following lower endoscopy (colonoscopy or flexible sigmoidoscopy):  Excessive amounts of blood in the stool  Significant tenderness or worsening of abdominal pains  Swelling of the abdomen that is new, acute  Fever of 100F or higher   Following upper endoscopy (EGD)  Vomiting of blood or coffee ground material  New chest pain or pain under the shoulder blades  Painful or persistently difficult swallowing  New shortness of breath  Fever of 100F or higher  Black, tarry-looking stools  For urgent or emergent issues, a gastroenterologist can be reached at any hour by calling [phone].   DIET:  We do recommend a small meal at first, but then you may proceed to your regular diet.  Drink  plenty of fluids but you should avoid alcoholic beverages for 24 hours.  ACTIVITY:  You should plan to take it easy for the rest of today and you should NOT DRIVE or use heavy machinery until tomorrow (because of the sedation medicines used during the test).    FOLLOW UP: Our staff will call the number listed on your records the next business day following your procedure to check on you and address any questions or concerns that you may have regarding the information given to you following your procedure. If we do not reach you, we will leave a message.  However, if you are feeling well and you are not experiencing any problems, there is no need to return our call.  We will assume that you have returned to your regular daily activities without incident.  If any biopsies were taken you will be contacted by phone or by letter within the next 1-3 weeks.  Please call us at [phone] if you have not heard about the biopsies in 3 weeks.    SIGNATURES/CONFIDENTIALITY: You and/or your care partner have signed paperwork which will be entered into your electronic medical record.  These signatures attest to the fact that that the information above on your After Visit Summary has been reviewed and is understood.  Full responsibility of the confidentiality of this discharge information lies with you and/or your care-partner.    Handouts were given to your care partner on polyps,  diverticulosis, and hemorrhoids. You may resume your current medications today. Await biopsy results. Your blood sugar was 110 in the recovery room. Please call if any questions or concerns.

## 2016-04-13 NOTE — Progress Notes (Signed)
Pt's states no medical or surgical changes since previsit or office visit. 

## 2016-04-13 NOTE — Op Note (Signed)
[place] Patient Name: Eddie Ferguson Procedure Date: 04/13/2016 10:26 AM MRN: [id number] Endoscopist: Jerene Bears , MD Age: 76 Referring MD:  Date of Birth: [birth year] Gender: Male Account #: [id number] Procedure:                Colonoscopy Indications:              Surveillance: Personal history of adenomatous                            polyps on last colonoscopy 5 years ago Medicines:                Monitored Anesthesia Care Procedure:                Pre-Anesthesia Assessment:                           - Prior to the procedure, a History and Physical                            was performed, and patient medications and                            allergies were reviewed. The patient's tolerance of                            previous anesthesia was also reviewed. The risks                            and benefits of the procedure and the sedation                            options and risks were discussed with the patient.                            All questions were answered, and informed consent                            was obtained. Prior Anticoagulants: The patient has                            taken no previous anticoagulant or antiplatelet                            agents. ASA Grade Assessment: III - A patient with                            severe systemic disease. After reviewing the risks                            and benefits, the patient was deemed in                            satisfactory condition to undergo the procedure.  After obtaining informed consent, the colonoscope                            was passed under direct vision. Throughout the                            procedure, the patient's blood pressure, pulse, and                            oxygen saturations were monitored continuously. The                            Model CF-[id number] scope was introduced                            through the anus and  advanced to the the cecum,                            identified by appendiceal orifice and ileocecal                            valve. The colonoscopy was performed without                            difficulty. The patient tolerated the procedure                            well. The quality of the bowel preparation was                            good. The ileocecal valve, appendiceal orifice, and                            rectum were photographed. Scope In: 10:30:05 AM Scope Out: 10:45:49 AM Scope Withdrawal Time: 0 hours 10 minutes 27 seconds  Total Procedure Duration: 0 hours 15 minutes 44 seconds  Findings:                 The digital rectal exam was normal.                           A 4 mm polyp was found in the cecum. The polyp was                            sessile. The polyp was removed with a cold snare.                            Resection and retrieval were complete.                           A 5 mm polyp was found in the ascending colon. The                            polyp was sessile. The  polyp was removed with a                            cold snare. Resection and retrieval were complete.                           A 4 mm polyp was found in the transverse colon. The                            polyp was sessile. The polyp was removed with a                            cold snare. Resection and retrieval were complete.                           A 4 mm polyp was found in the sigmoid colon. The                            polyp was sessile. The polyp was removed with a                            cold snare. Resection and retrieval were complete.                           Multiple small and large-mouthed diverticula were                            found in the sigmoid colon, descending colon and                            ascending colon.                           A localized area of mildly erythematous mucosa                            consistent with nonbleeding radiation  proctitis was                            found in the distal rectum.                           Internal hemorrhoids were found during                            retroflexion. The hemorrhoids were medium-sized. Complications:            No immediate complications. Estimated Blood Loss:     Estimated blood loss was minimal. Impression:               - One 4 mm polyp in the cecum, removed with a cold                            snare. Resected and retrieved.                           -  One 5 mm polyp in the ascending colon, removed                            with a cold snare. Resected and retrieved.                           - One 4 mm polyp in the transverse colon, removed                            with a cold snare. Resected and retrieved.                           - One 4 mm polyp in the sigmoid colon, removed with                            a cold snare. Resected and retrieved.                           - Moderate diverticulosis in the sigmoid colon, in                            the descending colon and in the ascending colon.                           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                           - Internal hemorrhoids. Recommendation:           - Patient has a contact number available for                            emergencies. The signs and symptoms of potential                            delayed complications were discussed with the                            patient. Return to normal activities tomorrow.                            Written discharge instructions were provided to the                            patient.                           - Resume previous diet.                           - Continue present medications.                           - Await pathology results.                           -  No recommendation at this time regarding repeat                            colonoscopy due to age. Jerene Bears, MD 04/13/2016 10:51:47 AM This report  has been signed electronically.

## 2016-04-13 NOTE — Progress Notes (Signed)
Called to room to assist during endoscopic procedure.  Patient ID and intended procedure confirmed with present staff. Received instructions for my participation in the procedure from the performing physician.  

## 2016-04-13 NOTE — Progress Notes (Signed)
No problems noted in the recovery room. maw 

## 2016-04-14 ENCOUNTER — Telehealth: Payer: Self-pay

## 2016-04-14 NOTE — Telephone Encounter (Signed)
Attempted to reach pt. With follow up call following endoscopic procedure yesterday.  LM on pt. Voice mail, will try to reach pt. Later today.

## 2016-04-14 NOTE — Telephone Encounter (Signed)
  Follow up Call-  Call back number 04/13/2016  Post procedure Call Back phone  # [phone]  Permission to leave phone message Yes  Some recent data might be hidden    Patient was called for follow up after his procedure on 04/13/2016. No answer at the number given for follow up phone call. A message was left on the answering machine.

## 2016-04-15 ENCOUNTER — Ambulatory Visit (INDEPENDENT_AMBULATORY_CARE_PROVIDER_SITE_OTHER): Payer: Medicare HMO

## 2016-04-15 VITALS — BP 146/80 | HR 64 | Ht 68.0 in | Wt 215.6 lb

## 2016-04-15 DIAGNOSIS — Z0000 Encounter for general adult medical examination without abnormal findings: Secondary | ICD-10-CM

## 2016-04-15 NOTE — Progress Notes (Addendum)
Subjective:   Eddie Ferguson is a 76 y.o. male who presents for Medicare Annual/Subsequent preventive examination.  Medicare Annual Preventive Care Visit - Subsequent Last OV 03/2016  Check BP from office note Recheck MMSE 28/30 In [facility] as poor, fair, good or great? Good   VS reviewed; patient brought in his spreadsheet  BP reading every day from jan 12 on One reading 150 77/ 1/4 1/26 reading 150 75 1/27 reading 150/69 1/28 reading 160/70 The rest of the reading wree 135 o 145/ 60-70  The last 7 days 139 70 146 74 148 73 154 74 143 67  112 52 they day of colonoscopy  145 75 States he has white coat syndrome 146 80   BMI 32 Diet; not a food person Likes salads; eats less now; doesn't clean his plate Gets full faster  Does not eat bread  Goes out to eat a lot May be a factor with BP  Pasta with shrimp; chinese place; green vegetable   Exercise No exercise but is no sedentary; volunteers Running errands and parking in the back of the lot [employer] 1/4 mile is not difficult Played softball and basketball  Discussed the pool; the cold bothers him Discussed silver sneaker [facility]; rec'd something in the mail regarding Silver sneakers and encouraged him to go and visit a class    1.)  HRA during the assessment today   2.) Review of Medical History: -PMH, PSH, Family History and current specialty and care providers reviewed and updated and listed below  - see scanned in document in chart and below  Social History   Social History  . Marital status: Married    Spouse name: N/A  . Number of children: N/A  . Years of education: N/A   Occupational History  . Not on file.   Social History Main Topics  . Smoking status: Former Smoker    Quit date: 02/03/2001  . Smokeless tobacco: Never Used     Comment: intermittent cigar use  . Alcohol use 0.0 oz/week     Comment: occasional  . Drug use: No  . Sexual activity: Not on file    Other Topics Concern  . Not on file   Social History Narrative   Married (wife patient outside practice). 2 children. 5 grandkids.       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       Hobbies: travel, enjoys going to shows, [facility] women's basketball tournament. Doesn't watch tv.     Family History  Problem Relation Age of Onset  . Diabetes Mother   . Diabetes Father   . Hyperlipidemia Father   . Colon polyps Father   . Brain cancer Brother     smoker  . Colon cancer Neg Hx   . Esophageal cancer Neg Hx   . Stomach cancer Neg Hx   . Rectal cancer Neg Hx     Medical issues that coincide with lifestyle:    3.) Review of functional ability and level of safety: See Medicare screens for hearing; vision; falls; IADLs and ADLs, Advanced Directives and I will update the later  Safety information given for community, home and personal safety;   Stressors: denies   General: alert, appear well hydrated and in no acute distress   Mood stable; attentive;   See patient instructions for recommendations.  4)The following written screening schedule of preventive measures were reviewed with assessment and plan made per below, orders  and patient instructions:     Alcohol screening done/no issue   Obesity Screening and counseling done Wife and he eat out, discussed sodium in foods and to drink a lot of water  AAA screening may need to be fup; long hx of cigar smoke(roughly 20 years) ; but did not smoke regularly States father worked for [employer] in 2002; over 15yo  Does not feel he had the equivalent of a 30 pack hs  Will send basket note to Dr. Yong Channel regarding AAA   Vaccines:  Up to date at present   Prostate cancer 07/22/2011  8 weeks of radiation and now fup with urologist on a regular basis eval coming up in March   Colorectal cancer screening: colonoscopy q10y or colo-guard reviewed  Completed 04/2016/ had on Wed; found polyps and most likely repeated in  5 years 2023;   VISION - last eye check  DM eye exam 01/2016 / due again 01/2017  Treating glaucoma  Hearing: 2000 hz both ears/ no issues of concern for him  Cardiovascular screening blood tests (lipids q5y) Chol 177; hdl 61; LDL 95; trig 105   Diabetes screening tests/ checks bs lowest 96 and average 109  A1c 6.2 and fbs 108 . No meds; managing well at this time   5) Summary: -risk factors and conditions per above assessment were discussed and treatment, recommendations and referrals were offered per documentation above and orders and patient instructions.  Education and counseling regarding the above review of health provided with a plan for the following: -fall prevention strategies discussed  -personal and community safety -healthy lifestyle discussed (weight loss, exercise, etc_  -importance and resources for completing advanced directives discussed -see patient instructions below for any other recommendations provided Prevention of falls- information given  Remove rugs or any tripping hazards in the home Use Non slip mats in bathtubs and showers Placing grab bars next to the toilet and or shower Placing handrails on both sides of the stair way Adding extra lighting in the home.  Lives in [place]; has a key to enter and is good when it is working   Nurse, children's issues reviewed:  1. Consider starting a community watch program per [facility] 2.  Changes batteries is smoke detector and/or carbon monoxide detector  3.  If you have firearms; keep them in a safe place 4.  Wear protection when in the sun; Always wear sunscreen or a hat; It is good to have your doctor check your skin annually or review any new areas of concern 5. Driving safety; Keep in the right lane; stay 3 car lengths behind the car in front of you on the highway; look 3 times prior to pulling out; carry your cell phone everywhere you go!    Learn about the Yellow Dot program:  The program  allows first responders at your emergency to have access to who your physician is, as well as your medications and medical conditions.  Citizens requesting the Yellow Dot Packages should contact Master Corporal Nunzio Cobbs at the [facility] [phone] for the first week of the program and beginning the week after Easter citizens should contact their Scientist, physiological.  Patient care Team   Dr. Noralee Chars; [facility] and he is treating his Prostate cancer and up Dr. Yong Channel;  Dr. Elvera Lennox; dermatologist  Dr. Hilarie Fredrickson GI  Dr. Teodoro Spray eye doctor      Objective:    Vitals: BP (!) 146/80  Pulse 64   Ht 5\' 8"  (1.727 m)   Wt 215 lb 9 oz (97.8 kg)   SpO2 98%   BMI 32.78 kg/m   Body mass index is 32.78 kg/m.  Tobacco History  Smoking Status  . Former Smoker  . Quit date: 02/03/2001  Smokeless Tobacco  . Never Used    Comment: intermittent cigar use     Counseling given: Yes   Past Medical History:  Diagnosis Date  . Allergy   . Ankle injury 06/01/2001   no surgery- skin debridement   . Arthritis   . Basal cell carcinoma 05/28/2014   L nasal tip. Mohs Dr. Link Snuffer.    . Blood transfusion    2010 because of stomach ulcers  . Cataract    bilaterally removed  . Diabetes mellitus   . GERD (gastroesophageal reflux disease)   . Glaucoma   . Hyperlipidemia   . Hypertension   . Hypothyroidism   . Pneumonia 04/12/2006  . Prostate cancer ([place])   . Seasonal allergies   . Sinusitis    treated for bacterial infection at least once a year at [facility]  . Stomach ulcer 2010   bleeding ulcer result NSAIDS   Past Surgical History:  Procedure Laterality Date  . abdominl abcess     03-15-2011  . BACK SURGERY    . CATARACT EXTRACTION, BILATERAL     04-07-2014, 03-17-2014  . COLONOSCOPY     2012, 5 year repeat  . CYSTOURETHROSCOPY  11/11/1998  . ESOPHAGOGASTRODUODENOSCOPY  2010   PUD  . LUMBAR LAMINECTOMY  2007   [place]  . MOHS SURGERY      left side of nose  . POLYPECTOMY    . PROSTATE BIOPSY     radiation 2016  . RECONSTRUCTION TENDON PULLEY W/ TENDON / FASCIAL GRAFT OF HAND / FINGER  1982   rt 3rd finger  . RECONSTRUCTION TENDON PULLEY W/ TENDON / FASCIAL GRAFT OF HAND / FINGER  1953   rt 5th finger  . UPPER GASTROINTESTINAL ENDOSCOPY     Family History  Problem Relation Age of Onset  . Diabetes Mother   . Diabetes Father   . Hyperlipidemia Father   . Colon polyps Father   . Brain cancer Brother     smoker  . Colon cancer Neg Hx   . Esophageal cancer Neg Hx   . Stomach cancer Neg Hx   . Rectal cancer Neg Hx    History  Sexual Activity  . Sexual activity: Not on file    Outpatient Encounter Prescriptions as of 04/15/2016  Medication Sig  . acetaminophen (TYLENOL) 500 MG tablet Take 500 mg by mouth every 6 (six) hours as needed. Reported on 06/16/2015  . allopurinol (ZYLOPRIM) 100 MG tablet Take 1 tablet (100 mg total) by mouth daily.  . carvedilol (COREG) 25 MG tablet Take 0.5 tablets (12.5 mg total) by mouth 2 (two) times daily with a meal.  . Cholecalciferol (VITAMIN D) 2000 UNITS CAPS Take by mouth daily.    . colchicine (COLCRYS) 0.6 MG tablet Take 2 tablets at first sign of gout, then once daily until attack resolves (7-10 days max)  . diltiazem (CARTIA XT) 180 MG 24 hr capsule Take 1 capsule (180 mg total) by mouth 2 (two) times daily.  . dorzolamide (TRUSOPT) 2 % ophthalmic solution   . fexofenadine (ALLEGRA) 180 MG tablet Take 180 mg by mouth.  . fluticasone (FLONASE) 50 MCG/ACT nasal spray Place 2 sprays into both nostrils daily.  Marland Kitchen  Glucosamine-Chondroit-Vit C-Mn (GLUCOSAMINE-CHONDROITIN) CAPS Take 1,500 mg by mouth.  Marland Kitchen glucose blood (FREESTYLE LITE) test strip Use to check blood sugars daily. Dx: E11.9  . hydrochlorothiazide (HYDRODIURIL) 25 MG tablet Take 1 tablet (25 mg total) by mouth daily.  Marland Kitchen latanoprost (XALATAN) 0.005 % ophthalmic solution Place 1 drop into both eyes at bedtime.   Marland Kitchen  levothyroxine (SYNTHROID) 50 MCG tablet Take 1 tablet (50 mcg total) by mouth daily.  . methocarbamol (ROBAXIN) 750 MG tablet Take 1 tablet (750 mg total) by mouth every 8 (eight) hours as needed for muscle spasms.  . Multiple Vitamin (MULTIVITAMIN) tablet Take 1 tablet by mouth daily.    . Omega-3 Fatty Acids (FISH OIL) 1200 MG CAPS Take by mouth daily.    Marland Kitchen omeprazole (PRILOSEC) 20 MG capsule Take 20 mg by mouth 2 (two) times daily before a meal.   . Potassium Chloride ER 20 MEQ TBCR Take one tablet, 20 mEq daily.  . pravastatin (PRAVACHOL) 40 MG tablet Take 1 tablet (40 mg total) by mouth daily.  . quinapril (ACCUPRIL) 40 MG tablet Take 1 tablet (40 mg total) by mouth daily.  . timolol (TIMOPTIC) 0.5 % ophthalmic solution    Facility-Administered Encounter Medications as of 04/15/2016  Medication  . 0.9 %  sodium chloride infusion    Activities of Daily Living In your present state of health, do you have any difficulty performing the following activities: 04/15/2016  Hearing? N  Vision? Y  Difficulty concentrating or making decisions? (No Data)  Walking or climbing stairs? Y  Dressing or bathing? N  Doing errands, shopping? N  Preparing Food and eating ? N  Using the Toilet? N  In the past six months, have you accidently leaked urine? N  Do you have problems with loss of bowel control? N  Managing your Medications? N  Managing your Finances? N  Housekeeping or managing your Housekeeping? N  Some recent data might be hidden    Patient Care Team: Marin Olp, MD as PCP - General (Family Medicine)   Assessment:     Exercise Activities and Dietary recommendations Current Exercise Habits: Home exercise routine  Goals    . Exercise 150 minutes per week (moderate activity)          [facility]  May try the silver sneaker class;       Fall Risk Fall Risk  04/15/2016 07/30/2015 01/09/2015 12/19/2014 04/23/2014  Falls in the past year? No No No No No    Depression Screen PHQ 2/9 Scores 04/15/2016 07/30/2015 01/09/2015 12/19/2014  PHQ - 2 Score 0 0 0 0    Cognitive Function MMSE - Mini Mental State Exam 04/15/2016  Orientation to time 5  Orientation to Place 5  Registration 3  Attention/ Calculation 5  Recall 1  Language- name 2 objects 2  Language- repeat 1  Language- follow 3 step command 3  Language- read & follow direction 1  Write a sentence 1  Copy design 1  Total score 28  states on occasion, he may not remember directions but then stops and thinks and generally remembers. 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  Keeps perfect spreadsheets Also takes notes to aid memory Uses GPS in car  No emotional changes/ no depression Was a Educational psychologist for company;  Recall issues noted in conversation.  Denies loss of interest in activities; Is with wife  most of the time. Can still use GPS. Clock test perfect How concerned are you? Doesn't keep him awake at hs Managing for now       Immunization History  Administered Date(s) Administered  . Hepatitis B 06/19/2007, 11/16/2007, 04/18/2008  . Influenza, High Dose Seasonal PF 12/21/2015  . Influenza,inj,Quad PF,36+ Mos 02/24/2015  . Influenza-Unspecified 01/27/2012, 03/22/2013, 01/21/2014  . Meningococcal Polysaccharide 12/19/2008  . Pneumococcal Conjugate-13 08/25/2014  . Pneumococcal Polysaccharide-23 12/21/2015  . Td 04/23/2014  . Tdap 03/07/2001   Screening Tests Health Maintenance  Topic Date Due  . ZOSTAVAX  05/06/2019 (Originally 05/25/1998)  . FOOT EXAM  07/08/2016  . HEMOGLOBIN A1C  09/15/2016  . OPHTHALMOLOGY EXAM  01/21/2017  . TETANUS/TDAP  04/23/2024  . INFLUENZA VACCINE  Completed  . PNA vac Low Risk Adult  Completed      Plan:      PCP Notes  Health Maintenance Had shingles 08/12/2008;  Educated on new vaccines are more effective   Abnormal Screens  Smoked cigars  probably over 20 years; Not daily and on irregular basis Will ask Dr. Yong Channel about AAA screen.   Referrals none  Patient concerns; retested memory with the same score Some loss noted during conversation regarding specific information Still managing meds; not repeating himself today  Nurse Concerns; keeps copious notes to aid memory  Next PCP apt as needed; ut by July to check DM screen Will continue to check BP; states doctor told him he does not have to check every day but continues to do so.   During the course of the visit the patient was educated and counseled about the following appropriate screening and preventive services:   Vaccines to include Pneumoccal, Influenza, Hepatitis B, Td, Zostavax, HCV  Electrocardiogram  Cardiovascular Disease  Colorectal cancer screening completed Wed   Diabetes screening very good for now  Prostate Cancer Screening followed by urology   Glaucoma screening under treatment  Nutrition counseling given regarding sodium  Smoking cessation counseling  Discussed AAA and will ask Dr. Yong Channel  Patient Instructions (the written plan) was given to the patient.    [id number], RN  04/15/2016  Agree with assessment and recommendations as above per Wynetta Fines, RN  Eulas Post MD [facility]

## 2016-04-15 NOTE — Progress Notes (Signed)
Subjective

## 2016-04-15 NOTE — Patient Instructions (Addendum)
Eddie Ferguson , Thank you for taking time to come for your Medicare Wellness Visit. I appreciate your ongoing commitment to your health goals. Please review the following plan we discussed and let me know if I can assist you in the future.   Prevention of falls: Remove rugs or any tripping hazards in the home Use Non slip mats in bathtubs and showers Placing grab bars next to the toilet and or shower Placing handrails on both sides of the stair way Adding extra lighting in the home.   Personal safety issues reviewed:  1. Consider starting a community watch program per [facility] 2.  Changes batteries is smoke detector and/or carbon monoxide detector  3.  If you have firearms; keep them in a safe place 4.  Wear protection when in the sun; Always wear sunscreen or a hat; It is good to have your doctor check your skin annually or review any new areas of concern 5. Driving safety; Keep in the right lane; stay 3 car lengths behind the car in front of you on the highway; look 3 times prior to pulling out; carry your cell phone everywhere you go!    Learn about the Yellow Dot program:  The program allows first responders at your emergency to have access to who your physician is, as well as your medications and medical conditions.  Citizens requesting the Yellow Dot Packages should contact Master Corporal Nunzio Cobbs at the [facility] [phone] for the first week of the program and beginning the week after Easter citizens should contact their Scientist, physiological.    These are the goals we discussed: Goals    . Exercise 150 minutes per week (moderate activity)          [facility]  May try the silver sneaker class;        This is a list of the screening recommended for you and due dates:  Health Maintenance  Topic Date Due  . Shingles Vaccine  05/06/2019*  . Complete foot exam   07/08/2016  . Hemoglobin A1C  09/15/2016  .  Eye exam for diabetics  01/21/2017  . Tetanus Vaccine  04/23/2024  . Flu Shot  Completed  . Pneumonia vaccines  Completed  *Topic was postponed. The date shown is not the original due date.   DASH Eating Plan DASH stands for "Dietary Approaches to Stop Hypertension." The DASH eating plan is a healthy eating plan that has been shown to reduce high blood pressure (hypertension). Additional health benefits may include reducing the risk of type 2 diabetes mellitus, heart disease, and stroke. The DASH eating plan may also help with weight loss. What do I need to know about the DASH eating plan? For the DASH eating plan, you will follow these general guidelines:  Choose foods with less than 150 milligrams of sodium per serving (as listed on the food label).  Use salt-free seasonings or herbs instead of table salt or sea salt.  Check with your health care provider or pharmacist before using salt substitutes.  Eat lower-sodium products. These are often labeled as "low-sodium" or "no salt added."  Eat fresh foods. Avoid eating a lot of canned foods.  Eat more vegetables, fruits, and low-fat dairy products.  Choose whole grains. Look for the word "whole" as the first word in the ingredient list.  Choose fish and skinless chicken or [place] more often than red meat. Limit fish, poultry, and meat to  6 oz (170 g) each day.  Limit sweets, desserts, sugars, and sugary drinks.  Choose heart-healthy fats.  Eat more home-cooked food and less restaurant, buffet, and fast food.  Limit fried foods.  Do not fry foods. Cook foods using methods such as baking, boiling, grilling, and broiling instead.  When eating at a restaurant, ask that your food be prepared with less salt, or no salt if possible. What foods can I eat? Seek help from a dietitian for individual calorie needs. Grains  Whole grain or whole wheat bread. Brown rice. Whole grain or whole wheat pasta. Quinoa, bulgur, and whole grain  cereals. Low-sodium cereals. Corn or whole wheat flour tortillas. Whole grain cornbread. Whole grain crackers. Low-sodium crackers. Vegetables  Fresh or frozen vegetables (raw, steamed, roasted, or grilled). Low-sodium or reduced-sodium tomato and vegetable juices. Low-sodium or reduced-sodium tomato sauce and paste. Low-sodium or reduced-sodium canned vegetables. Fruits  All fresh, canned (in natural juice), or frozen fruits. Meat and Other Protein Products  Ground beef (85% or leaner), grass-fed beef, or beef trimmed of fat. Skinless chicken or [place]. Ground chicken or [place]. Pork trimmed of fat. All fish and seafood. Eggs. Dried beans, peas, or lentils. Unsalted nuts and seeds. Unsalted canned beans. Dairy  Low-fat dairy products, such as skim or 1% milk, 2% or reduced-fat cheeses, low-fat ricotta or cottage cheese, or plain low-fat yogurt. Low-sodium or reduced-sodium cheeses. Fats and Oils  Tub margarines without trans fats. Light or reduced-fat mayonnaise and salad dressings (reduced sodium). Avocado. Safflower, olive, or canola oils. Natural peanut or almond butter. Other  Unsalted popcorn and pretzels. The items listed above may not be a complete list of recommended foods or beverages. Contact your dietitian for more options.  What foods are not recommended? Grains  White bread. White pasta. White rice. Refined cornbread. Bagels and croissants. Crackers that contain trans fat. Vegetables  Creamed or fried vegetables. Vegetables in a cheese sauce. Regular canned vegetables. Regular canned tomato sauce and paste. Regular tomato and vegetable juices. Fruits  Canned fruit in light or heavy syrup. Fruit juice. Meat and Other Protein Products  Fatty cuts of meat. Ribs, chicken wings, bacon, sausage, bologna, salami, chitterlings, fatback, hot dogs, bratwurst, and packaged luncheon meats. Salted nuts and seeds. Canned beans with salt. Dairy  Whole or 2% milk, cream, half-and-half, and  cream cheese. Whole-fat or sweetened yogurt. Full-fat cheeses or blue cheese. Nondairy creamers and whipped toppings. Processed cheese, cheese spreads, or cheese curds. Condiments  Onion and garlic salt, seasoned salt, table salt, and sea salt. Canned and packaged gravies. Worcestershire sauce. Tartar sauce. Barbecue sauce. Teriyaki sauce. Soy sauce, including reduced sodium. Steak sauce. Fish sauce. Oyster sauce. Cocktail sauce. Horseradish. Ketchup and mustard. Meat flavorings and tenderizers. Bouillon cubes. Hot sauce. Tabasco sauce. Marinades. Taco seasonings. Relishes. Fats and Oils  Butter, stick margarine, lard, shortening, ghee, and bacon fat. Coconut, palm kernel, or palm oils. Regular salad dressings. Other  Pickles and olives. Salted popcorn and pretzels. The items listed above may not be a complete list of foods and beverages to avoid. Contact your dietitian for more information.  Where can I find more information? National Heart, Lung, and Blood Institute: travelstabloid.com This information is not intended to replace advice given to you by your health care provider. Make sure you discuss any questions you have with your health care provider. Document Released: 02/10/2011 Document Revised: 07/30/2015 Document Reviewed: 12/26/2012 Elsevier Interactive Patient Education  2017 [place] Prevention in  the Home Introduction Falls can cause injuries. They can happen to people of all ages. There are many things you can do to make your home safe and to help prevent falls. What can I do on the outside of my home?  Regularly fix the edges of walkways and driveways and fix any cracks.  Remove anything that might make you trip as you walk through a door, such as a raised step or threshold.  Trim any bushes or trees on the path to your home.  Use bright outdoor lighting.  Clear any walking paths of anything that might make someone trip,  such as rocks or tools.  Regularly check to see if handrails are loose or broken. Make sure that both sides of any steps have handrails.  Any raised decks and porches should have guardrails on the edges.  Have any leaves, snow, or ice cleared regularly.  Use sand or salt on walking paths during winter.  Clean up any spills in your garage right away. This includes oil or grease spills. What can I do in the bathroom?  Use night lights.  Install grab bars by the toilet and in the tub and shower. Do not use towel bars as grab bars.  Use non-skid mats or decals in the tub or shower.  If you need to sit down in the shower, use a plastic, non-slip stool.  Keep the floor dry. Clean up any water that spills on the floor as soon as it happens.  Remove soap buildup in the tub or shower regularly.  Attach bath mats securely with double-sided non-slip rug tape.  Do not have throw rugs and other things on the floor that can make you trip. What can I do in the bedroom?  Use night lights.  Make sure that you have a light by your bed that is easy to reach.  Do not use any sheets or blankets that are too big for your bed. They should not hang down onto the floor.  Have a firm chair that has side arms. You can use this for support while you get dressed.  Do not have throw rugs and other things on the floor that can make you trip. What can I do in the kitchen?  Clean up any spills right away.  Avoid walking on wet floors.  Keep items that you use a lot in easy-to-reach places.  If you need to reach something above you, use a strong step stool that has a grab bar.  Keep electrical cords out of the way.  Do not use floor polish or wax that makes floors slippery. If you must use wax, use non-skid floor wax.  Do not have throw rugs and other things on the floor that can make you trip. What can I do with my stairs?  Do not leave any items on the stairs.  Make sure that there are  handrails on both sides of the stairs and use them. Fix handrails that are broken or loose. Make sure that handrails are as long as the stairways.  Check any carpeting to make sure that it is firmly attached to the stairs. Fix any carpet that is loose or worn.  Avoid having throw rugs at the top or bottom of the stairs. If you do have throw rugs, attach them to the floor with carpet tape.  Make sure that you have a light switch at the top of the stairs and the bottom of the stairs. If you do not  have them, ask someone to add them for you. What else can I do to help prevent falls?  Wear shoes that:  Do not have high heels.  Have rubber bottoms.  Are comfortable and fit you well.  Are closed at the toe. Do not wear sandals.  If you use a stepladder:  Make sure that it is fully opened. Do not climb a closed stepladder.  Make sure that both sides of the stepladder are locked into place.  Ask someone to hold it for you, if possible.  Clearly mark and make sure that you can see:  Any grab bars or handrails.  First and last steps.  Where the edge of each step is.  Use tools that help you move around (mobility aids) if they are needed. These include:  Canes.  Walkers.  Scooters.  Crutches.  Turn on the lights when you go into a dark area. Replace any light bulbs as soon as they burn out.  Set up your furniture so you have a clear path. Avoid moving your furniture around.  If any of your floors are uneven, fix them.  If there are any pets around you, be aware of where they are.  Review your medicines with your doctor. Some medicines can make you feel dizzy. This can increase your chance of falling. Ask your doctor what other things that you can do to help prevent falls. This information is not intended to replace advice given to you by your health care provider. Make sure you discuss any questions you have with your health care provider. Document Released: 12/18/2008  Document Revised: 07/30/2015 Document Reviewed: 03/28/2014  2017 Elsevier  Health Maintenance, Male A healthy lifestyle and preventative care can promote health and wellness.  Maintain regular health, dental, and eye exams.  Eat a healthy diet. Foods like vegetables, fruits, whole grains, low-fat dairy products, and lean protein foods contain the nutrients you need and are low in calories. Decrease your intake of foods high in solid fats, added sugars, and salt. Get information about a proper diet from your health care provider, if necessary.  Regular physical exercise is one of the most important things you can do for your health. Most adults should get at least 150 minutes of moderate-intensity exercise (any activity that increases your heart rate and causes you to sweat) each week. In addition, most adults need muscle-strengthening exercises on 2 or more days a week.   Maintain a healthy weight. The body mass index (BMI) is a screening tool to identify possible weight problems. It provides an estimate of body fat based on height and weight. Your health care provider can find your BMI and can help you achieve or maintain a healthy weight. For males 20 years and older:  A BMI below 18.5 is considered underweight.  A BMI of 18.5 to 24.9 is normal.  A BMI of 25 to 29.9 is considered overweight.  A BMI of 30 and above is considered obese.  Maintain normal blood lipids and cholesterol by exercising and minimizing your intake of saturated fat. Eat a balanced diet with plenty of fruits and vegetables. Blood tests for lipids and cholesterol should begin at age 70 and be repeated every 5 years. If your lipid or cholesterol levels are high, you are over age 96, or you are at high risk for heart disease, you may need your cholesterol levels checked more frequently.Ongoing high lipid and cholesterol levels should be treated with medicines if diet and exercise are not  working.  If you smoke, find out  from your health care provider how to quit. If you do not use tobacco, do not start.  Lung cancer screening is recommended for adults aged 39-80 years who are at high risk for developing lung cancer because of a history of smoking. A yearly low-dose CT scan of the lungs is recommended for people who have at least a 30-pack-year history of smoking and are current smokers or have quit within the past 15 years. A pack year of smoking is smoking an average of 1 pack of cigarettes a day for 1 year (for example, a 30-pack-year history of smoking could mean smoking 1 pack a day for 30 years or 2 packs a day for 15 years). Yearly screening should continue until the smoker has stopped smoking for at least 15 years. Yearly screening should be stopped for people who develop a health problem that would prevent them from having lung cancer treatment.  If you choose to drink alcohol, do not have more than 2 drinks per day. One drink is considered to be 12 oz (360 mL) of beer, 5 oz (150 mL) of wine, or 1.5 oz (45 mL) of liquor.  Avoid the use of street drugs. Do not share needles with anyone. Ask for help if you need support or instructions about stopping the use of drugs.  High blood pressure causes heart disease and increases the risk of stroke. High blood pressure is more likely to develop in:  People who have blood pressure in the end of the normal range (100-139/85-89 mm Hg).  People who are overweight or obese.  People who are African American.  If you are 19-58 years of age, have your blood pressure checked every 3-5 years. If you are 15 years of age or older, have your blood pressure checked every year. You should have your blood pressure measured twice-once when you are at a hospital or clinic, and once when you are not at a hospital or clinic. Record the average of the two measurements. To check your blood pressure when you are not at a hospital or clinic, you can use:  An automated blood pressure  machine at a pharmacy.  A home blood pressure monitor.  If you are 10-24 years old, ask your health care provider if you should take aspirin to prevent heart disease.  Diabetes screening involves taking a blood sample to check your fasting blood sugar level. This should be done once every 3 years after age 40 if you are at a normal weight and without risk factors for diabetes. Testing should be considered at a younger age or be carried out more frequently if you are overweight and have at least 1 risk factor for diabetes.  Colorectal cancer can be detected and often prevented. Most routine colorectal cancer screening begins at the age of 38 and continues through age 48. However, your health care provider may recommend screening at an earlier age if you have risk factors for colon cancer. On a yearly basis, your health care provider may provide home test kits to check for hidden blood in the stool. A small camera at the end of a tube may be used to directly examine the colon (sigmoidoscopy or colonoscopy) to detect the earliest forms of colorectal cancer. Talk to your health care provider about this at age 59 when routine screening begins. A direct exam of the colon should be repeated every 5-10 years through age 75, unless early forms of precancerous polyps  or small growths are found.  People who are at an increased risk for hepatitis B should be screened for this virus. You are considered at high risk for hepatitis B if:  You were born in a country where hepatitis B occurs often. Talk with your health care provider about which countries are considered high risk.  Your parents were born in a high-risk country and you have not received a shot to protect against hepatitis B (hepatitis B vaccine).  You have HIV or AIDS.  You use needles to inject street drugs.  You live with, or have sex with, someone who has hepatitis B.  You are a man who has sex with other men (MSM).  You get hemodialysis  treatment.  You take certain medicines for conditions like cancer, organ transplantation, and autoimmune conditions.  Hepatitis C blood testing is recommended for all people born from [birth year] through 1967 and any individual with known risk factors for hepatitis C.  Healthy men should no longer receive prostate-specific antigen (PSA) blood tests as part of routine cancer screening. Talk to your health care provider about prostate cancer screening.  Testicular cancer screening is not recommended for adolescents or adult males who have no symptoms. Screening includes self-exam, a health care provider exam, and other screening tests. Consult with your health care provider about any symptoms you have or any concerns you have about testicular cancer.  Practice safe sex. Use condoms and avoid high-risk sexual practices to reduce the spread of sexually transmitted infections (STIs).  You should be screened for STIs, including gonorrhea and chlamydia if:  You are sexually active and are younger than 24 years.  You are older than 24 years, and your health care provider tells you that you are at risk for this type of infection.  Your sexual activity has changed since you were last screened, and you are at an increased risk for chlamydia or gonorrhea. Ask your health care provider if you are at risk.  If you are at risk of being infected with HIV, it is recommended that you take a prescription medicine daily to prevent HIV infection. This is called pre-exposure prophylaxis (PrEP). You are considered at risk if:  You are a man who has sex with other men (MSM).  You are a heterosexual man who is sexually active with multiple partners.  You take drugs by injection.  You are sexually active with a partner who has HIV.  Talk with your health care provider about whether you are at high risk of being infected with HIV. If you choose to begin PrEP, you should first be tested for HIV. You should then be tested  every 3 months for as long as you are taking PrEP.  Use sunscreen. Apply sunscreen liberally and repeatedly throughout the day. You should seek shade when your shadow is shorter than you. Protect yourself by wearing long sleeves, pants, a wide-brimmed hat, and sunglasses year round whenever you are outdoors.  Tell your health care provider of new moles or changes in moles, especially if there is a change in shape or color. Also, tell your health care provider if a mole is larger than the size of a pencil eraser.  A one-time screening for abdominal aortic aneurysm (AAA) and surgical repair of large AAAs by ultrasound is recommended for men aged 81-75 years who are current or former smokers.  Stay current with your vaccines (immunizations). This information is not intended to replace advice given to you by your health care  provider. Make sure you discuss any questions you have with your health care provider. Document Released: 08/20/2007 Document Revised: 03/14/2014 Document Reviewed: 11/25/2014 Elsevier Interactive Patient Education  2017 [employer].

## 2016-04-21 ENCOUNTER — Encounter: Payer: Self-pay | Admitting: Internal Medicine

## 2016-05-02 ENCOUNTER — Encounter: Payer: Self-pay | Admitting: Family Medicine

## 2016-05-02 ENCOUNTER — Ambulatory Visit (INDEPENDENT_AMBULATORY_CARE_PROVIDER_SITE_OTHER): Payer: Medicare HMO | Admitting: Family Medicine

## 2016-05-02 VITALS — BP 156/70 | HR 66 | Temp 98.3°F | Ht 68.0 in | Wt 218.0 lb

## 2016-05-02 DIAGNOSIS — E034 Atrophy of thyroid (acquired): Secondary | ICD-10-CM

## 2016-05-02 DIAGNOSIS — E785 Hyperlipidemia, unspecified: Secondary | ICD-10-CM | POA: Diagnosis not present

## 2016-05-02 DIAGNOSIS — E119 Type 2 diabetes mellitus without complications: Secondary | ICD-10-CM | POA: Diagnosis not present

## 2016-05-02 DIAGNOSIS — I10 Essential (primary) hypertension: Secondary | ICD-10-CM | POA: Diagnosis not present

## 2016-05-02 MED ORDER — CHLORTHALIDONE 25 MG PO TABS: 12.5000 mg | ORAL_TABLET | Freq: Every day | ORAL | 5 refills | Status: DC

## 2016-05-02 NOTE — Progress Notes (Signed)
Subjective:  Eddie Ferguson is a 76 y.o. year old very pleasant male patient who presents for/with See problem oriented charting ROS- admits to fatigue. No chest pain or shortness of breath. No melena or BRBPR.    Past Medical History-  Patient Active Problem List   Diagnosis Date Noted  . Prostate cancer ([place]) 05/28/2014    Priority: High  . Diabetes mellitus type 2, controlled ([place]) 04/23/2014    Priority: High  . Gout 07/09/2015    Priority: Medium  . Short-term memory loss 06/16/2015    Priority: Medium  . Essential hypertension 04/23/2014    Priority: Medium  . Hyperlipidemia 04/23/2014    Priority: Medium  . Hypothyroidism 04/23/2014    Priority: Medium  . Stomach ulcer     Priority: Medium  . Adenomatous colon polyp 05/28/2014    Priority: Low  . Osteoarthritis 05/28/2014    Priority: Low  . Erectile dysfunction 05/28/2014    Priority: Low  . Basal cell carcinoma 05/28/2014    Priority: Low  . GERD (gastroesophageal reflux disease) 04/23/2014    Priority: Low  . Allergic rhinitis 04/23/2014    Priority: Low  . Glaucoma 04/23/2014    Priority: Low    Medications- reviewed and updated Current Outpatient Prescriptions  Medication Sig Dispense Refill  . acetaminophen (TYLENOL) 500 MG tablet Take 500 mg by mouth every 6 (six) hours as needed. Reported on 06/16/2015    . carvedilol (COREG) 25 MG tablet Take 0.5 tablets (12.5 mg total) by mouth 2 (two) times daily with a meal. 180 tablet 3  . Cholecalciferol (VITAMIN D) 2000 UNITS CAPS Take by mouth daily.      . colchicine (COLCRYS) 0.6 MG tablet Take 2 tablets at first sign of gout, then once daily until attack resolves (7-10 days max) 30 tablet 2  . diltiazem (CARTIA XT) 180 MG 24 hr capsule Take 1 capsule (180 mg total) by mouth 2 (two) times daily. 180 capsule 3  . dorzolamide (TRUSOPT) 2 % ophthalmic solution   2  . fexofenadine (ALLEGRA) 180 MG tablet Take 180 mg by mouth.    . fluticasone (FLONASE) 50  MCG/ACT nasal spray Place 2 sprays into both nostrils daily. 48 g 3  . Glucosamine-Chondroit-Vit C-Mn (GLUCOSAMINE-CHONDROITIN) CAPS Take 1,500 mg by mouth.    Marland Kitchen glucose blood (FREESTYLE LITE) test strip Use to check blood sugars daily. Dx: E11.9 100 each 12  . latanoprost (XALATAN) 0.005 % ophthalmic solution Place 1 drop into both eyes at bedtime.     Marland Kitchen levothyroxine (SYNTHROID) 50 MCG tablet Take 1 tablet (50 mcg total) by mouth daily. 90 tablet 3  . methocarbamol (ROBAXIN) 750 MG tablet Take 1 tablet (750 mg total) by mouth every 8 (eight) hours as needed for muscle spasms. 60 tablet 3  . Multiple Vitamin (MULTIVITAMIN) tablet Take 1 tablet by mouth daily.      . Omega-3 Fatty Acids (FISH OIL) 1200 MG CAPS Take by mouth daily.      Marland Kitchen omeprazole (PRILOSEC) 20 MG capsule Take 20 mg by mouth 2 (two) times daily before a meal.     . Potassium Chloride ER 20 MEQ TBCR Take one tablet, 20 mEq daily. 90 tablet 3  . pravastatin (PRAVACHOL) 40 MG tablet Take 1 tablet (40 mg total) by mouth daily. 90 tablet 3  . quinapril (ACCUPRIL) 40 MG tablet Take 1 tablet (40 mg total) by mouth daily. 90 tablet 3  . timolol (TIMOPTIC) 0.5 % ophthalmic solution  1  . allopurinol (ZYLOPRIM) 100 MG tablet Take 1 tablet (100 mg total) by mouth daily. (Patient not taking: Reported on 05/02/2016) 90 tablet 3  . chlorthalidone (HYGROTON) 25 MG tablet Take 0.5-1 tablets (12.5-25 mg total) by mouth daily. 30 tablet 5   Current Facility-Administered Medications  Medication Dose Route Frequency Provider Last Rate Last Dose  . 0.9 %  sodium chloride infusion  500 mL Intravenous Continuous Nelida Meuse III, MD        Objective: BP (!) 156/70 (BP Location: Left Arm, Patient Position: Sitting, Cuff Size: Large)   Pulse 66   Temp 98.3 F (36.8 C) (Oral)   Ht 5\' 8"  (1.727 m)   Wt 218 lb (98.9 kg)   SpO2 95%   BMI 33.15 kg/m  Gen: NAD, resting comfortably No mucus membrane pallor CV: RRR no murmurs rubs or  gallops Lungs: CTAB no crackles, wheeze, rhonchi Abdomen: soft/nontender/nondistended/normal bowel sounds. obese  Ext: no edema Skin: warm, dry, no rash  Assessment/Plan:  Diabetes mellitus type 2, controlled ([place]) S: has come off actos since 03/18/16. Average 117 fasting when testing once a week.  A/P: does not sound like has had a big increase in cbgs- will continue once a week checks and update a1c at follow up  Essential hypertension S: controlled poorly on coreg 12.5mg  BID, diltiazem 180mg  XL BID, quinapril 40mg . Had been on hctz 12.5mg --> 25mg .  He stopped this hctz as he was concerned about fatigue. He experimented and fatigue got worse each time he restarted hctz. At home averages 150 up from 145 previously. He had also stopped quinapril but restarted this and did not seem to worsen fatigue.  BP Readings from Last 3 Encounters:  05/02/16 (!) 156/70  04/15/16 (!) 146/80  04/13/16 (!) 134/59  A/P:Continue current meds with coreg 12.5mg  BID, diltiazem 180mg  XL BID, quinapril 40mg . We opted to trial chlorthalidone 12.5mg  and follow up in next few weeks. Perhaps less fatigue with this. If does not seem to help- we also discussed stopping chlorthalidone and considering changing diltiazem to amlodipine 10mg .   Hyperlipidemia S: Patient states multiple medicines were changed form wise from pharmacy including. Changed quinapril to different tablet, changed pravastatin, potassium, allopurinol. Changed them around the first of this month. Colonoscopy on 7th and then felt pretty good a few days later- now feeling more fatigued. As noted in HTN- stopped quinapril and hctz (though hctz did not seem to change) A/P: suspect worsened control off of pravastatin but we agreed as for primary prevention could continue trial off for now and may restart at later date.   Also in regards to fatigue-  No chest pain or shortness of breath. No headache or blurry vision. No dizziness. Does feel like his eyes and  body are tired. Up to date on colonoscopy (earlier this year) and has follow up for prostate cancer with urology.  We agreed to update labs.   2 weeks  Update labs to look into other causes of fatigue Orders Placed This Encounter  Procedures  . TSH    [place]  . CBC with Differential/Platelet  . Comprehensive metabolic panel       Meds ordered this encounter  Medications  . chlorthalidone (HYGROTON) 25 MG tablet    Sig: Take 0.5-1 tablets (12.5-25 mg total) by mouth daily.    Dispense:  30 tablet    Refill:  5    Return precautions advised.  Garret Reddish, MD

## 2016-05-02 NOTE — Assessment & Plan Note (Signed)
S: Patient states multiple medicines were changed form wise from pharmacy including. Changed quinapril to different tablet, changed pravastatin, potassium, allopurinol. Changed them around the first of this month. Colonoscopy on 7th and then felt pretty good a few days later- now feeling more fatigued. As noted in HTN- stopped quinapril and hctz (though hctz did not seem to change) A/P: suspect worsened control off of pravastatin but we agreed as for primary prevention could continue trial off for now and may restart at later date.   Also in regards to fatigue-  No chest pain or shortness of breath. No headache or blurry vision. No dizziness. Does feel like his eyes and body are tired. Up to date on colonoscopy (earlier this year) and has follow up for prostate cancer with urology.  We agreed to update labs.

## 2016-05-02 NOTE — Patient Instructions (Signed)
Please stop by lab before you go  Start chlorthalidone but only 12.5mg  (half tablet) for now  Lets follow up in about 2 weeks to see how you are doing. Sooner if you need [place] or worsening symptoms

## 2016-05-02 NOTE — Progress Notes (Signed)
Pre visit review using our clinic review tool, if applicable. No additional management support is needed unless otherwise documented below in the visit note. 

## 2016-05-02 NOTE — Assessment & Plan Note (Signed)
S: controlled poorly on coreg 12.5mg  BID, diltiazem 180mg  XL BID, quinapril 40mg . Had been on hctz 12.5mg --> 25mg .  He stopped this hctz as he was concerned about fatigue. He experimented and fatigue got worse each time he restarted hctz. At home averages 150 up from 145 previously. He had also stopped quinapril but restarted this and did not seem to worsen fatigue.  BP Readings from Last 3 Encounters:  05/02/16 (!) 156/70  04/15/16 (!) 146/80  04/13/16 (!) 134/59  A/P:Continue current meds with coreg 12.5mg  BID, diltiazem 180mg  XL BID, quinapril 40mg . We opted to trial chlorthalidone 12.5mg  and follow up in next few weeks. Perhaps less fatigue with this. If does not seem to help- we also discussed stopping chlorthalidone and considering changing diltiazem to amlodipine 10mg .

## 2016-05-02 NOTE — Assessment & Plan Note (Signed)
S: has come off actos since 03/18/16. Average 117 fasting when testing once a week.  A/P: does not sound like has had a big increase in cbgs- will continue once a week checks and update a1c at follow up

## 2016-05-03 LAB — CBC WITH DIFFERENTIAL/PLATELET
Basophils Absolute: 0 10*3/uL (ref 0.0–0.1)
Basophils Relative: 0.5 % (ref 0.0–3.0)
Eosinophils Absolute: 0.3 10*3/uL (ref 0.0–0.7)
Eosinophils Relative: 3.6 % (ref 0.0–5.0)
HCT: 40.6 % (ref 39.0–52.0)
HEMOGLOBIN: 13.7 g/dL (ref 13.0–17.0)
LYMPHS ABS: 1.3 10*3/uL (ref 0.7–4.0)
Lymphocytes Relative: 14.3 % (ref 12.0–46.0)
MCHC: 33.8 g/dL (ref 30.0–36.0)
MCV: 91.4 fl (ref 78.0–100.0)
Monocytes Absolute: 0.8 10*3/uL (ref 0.1–1.0)
Monocytes Relative: 8.8 % (ref 3.0–12.0)
NEUTROS PCT: 72.8 % (ref 43.0–77.0)
Neutro Abs: 6.4 10*3/uL (ref 1.4–7.7)
Platelets: 235 10*3/uL (ref 150.0–400.0)
RBC: 4.44 Mil/uL (ref 4.22–5.81)
RDW: 14.2 % (ref 11.5–15.5)
WBC: 8.8 10*3/uL (ref 4.0–10.5)

## 2016-05-03 LAB — COMPREHENSIVE METABOLIC PANEL
ALBUMIN: 4.2 g/dL (ref 3.5–5.2)
ALK PHOS: 53 U/L (ref 39–117)
ALT: 17 U/L (ref 0–53)
AST: 19 U/L (ref 0–37)
BUN: 13 mg/dL (ref 6–23)
CALCIUM: 9.3 mg/dL (ref 8.4–10.5)
CO2: 27 mEq/L (ref 19–32)
Chloride: 104 mEq/L (ref 96–112)
Creatinine, Ser: 1.26 mg/dL (ref 0.40–1.50)
GFR: 58.84 mL/min — AB (ref >60.00)
Glucose, Bld: 113 mg/dL — ABNORMAL HIGH (ref 70–99)
POTASSIUM: 3.9 meq/L (ref 3.5–5.1)
Sodium: 142 mEq/L (ref 135–145)
TOTAL PROTEIN: 6 g/dL (ref 6.0–8.3)
Total Bilirubin: 0.4 mg/dL (ref 0.2–1.2)

## 2016-05-03 LAB — TSH: TSH: 2.12 u[IU]/mL (ref 0.35–4.50)

## 2016-05-13 DIAGNOSIS — C61 Malignant neoplasm of prostate: Secondary | ICD-10-CM | POA: Diagnosis not present

## 2016-05-13 LAB — PSA: PSA: 0.22

## 2016-05-20 DIAGNOSIS — Z8546 Personal history of malignant neoplasm of prostate: Secondary | ICD-10-CM | POA: Diagnosis not present

## 2016-06-12 ENCOUNTER — Encounter (HOSPITAL_COMMUNITY): Payer: Self-pay | Admitting: Emergency Medicine

## 2016-06-12 ENCOUNTER — Ambulatory Visit (INDEPENDENT_AMBULATORY_CARE_PROVIDER_SITE_OTHER): Payer: Medicare HMO

## 2016-06-12 ENCOUNTER — Other Ambulatory Visit: Payer: Self-pay | Admitting: Family Medicine

## 2016-06-12 ENCOUNTER — Ambulatory Visit (HOSPITAL_COMMUNITY)
Admission: EM | Admit: 2016-06-12 | Discharge: 2016-06-12 | Disposition: A | Discharge status: Home / Self Care | Payer: Medicare HMO | Attending: Emergency Medicine | Admitting: Emergency Medicine

## 2016-06-12 DIAGNOSIS — M79671 Pain in right foot: Secondary | ICD-10-CM | POA: Diagnosis not present

## 2016-06-12 DIAGNOSIS — M19071 Primary osteoarthritis, right ankle and foot: Secondary | ICD-10-CM | POA: Diagnosis not present

## 2016-06-12 MED ORDER — DICLOFENAC SODIUM 1 % TD GEL: 1.0000 "application " | Freq: Four times a day (QID) | TRANSDERMAL | 0 refills | Status: DC

## 2016-06-12 NOTE — Discharge Instructions (Signed)
Increase your Tylenol 2000 mg 3, up to 4 times a day. Try the Voltaren gel. This may be helpful. You can also try increasing your allopurinol to 200 mg a day. Elevate your foot above your heart as much as possible. Follow-up with a podiatrist at [facility] if you're not getting better within a week, not immediately to the emergency department if your foot turns blue, white, cold, for pain not controlled with medications, or other concerns.

## 2016-06-12 NOTE — ED Triage Notes (Signed)
The patient presented to the [facility] with a complaint of right foot pain, specifically the great toe, for 1 week. The patient denied any known injury.

## 2016-06-12 NOTE — ED Provider Notes (Signed)
HPI  SUBJECTIVE:  Eddie Ferguson. Eddie Ferguson is a 76 y.o. male who presents with one week of sharp, constant lateral right foot pain at the distal head of the fifth metatarsal where a callus is. Splinting Dr. Felicie Morn callus pad on it and removing some of the calloused skin, but last did this about 2 or 3 weeks ago. He has been fine up until a week ago. He has been taking Tylenol with thousand milligrams twice a day, and occasionally elevating his foot. Symptoms better at rest. Symptoms worse with walking/weightbearing. He denies recent injury, changes physical activity, erythema, bruising, numbness, tingling. He reports mild swelling over his entire foot. No fevers. He has never had symptoms like this before. He has a past medical history including hypertension, diabetes, prostate cancer, osteoarthritis and gout for which he takes allopurinol 100 mg as prophylaxis. He also has history of stomach ulcers and cannot take NSAIDs. He has a history of crush injury to his right foot in 2003. No history of diabetic neuropathy or kidney disease. PMD: Garret Reddish, MD   Past Medical History:  Diagnosis Date  . Allergy   . Ankle injury 06/01/2001   no surgery- skin debridement   . Arthritis   . Basal cell carcinoma 05/28/2014   L nasal tip. Mohs Dr. Link Snuffer.    . Blood transfusion    2010 because of stomach ulcers  . Cataract    bilaterally removed  . Diabetes mellitus   . GERD (gastroesophageal reflux disease)   . Glaucoma   . Hyperlipidemia   . Hypertension   . Hypothyroidism   . Pneumonia 04/12/2006  . Prostate cancer ([place])   . Seasonal allergies   . Sinusitis    treated for bacterial infection at least once a year at [facility]  . Stomach ulcer 2010   bleeding ulcer result NSAIDS    Past Surgical History:  Procedure Laterality Date  . abdominl abcess     03-15-2011  . BACK SURGERY    . CATARACT EXTRACTION, BILATERAL     04-07-2014, 03-17-2014  . COLONOSCOPY     2012, 5 year repeat  .  CYSTOURETHROSCOPY  11/11/1998  . ESOPHAGOGASTRODUODENOSCOPY  2010   [facility]  . LUMBAR LAMINECTOMY  2007   [place]  . MOHS SURGERY     left side of nose  . POLYPECTOMY    . PROSTATE BIOPSY     radiation 2016  . RECONSTRUCTION TENDON PULLEY W/ TENDON / FASCIAL GRAFT OF HAND / FINGER  1982   rt 3rd finger  . RECONSTRUCTION TENDON PULLEY W/ TENDON / FASCIAL GRAFT OF HAND / FINGER  1953   rt 5th finger  . UPPER GASTROINTESTINAL ENDOSCOPY      Family History  Problem Relation Age of Onset  . Diabetes Mother   . Diabetes Father   . Hyperlipidemia Father   . Colon polyps Father   . Brain cancer Brother     smoker  . Colon cancer Neg Hx   . Esophageal cancer Neg Hx   . Stomach cancer Neg Hx   . Rectal cancer Neg Hx     Social History  Substance Use Topics  . Smoking status: Former Smoker    Quit date: 02/03/2001  . Smokeless tobacco: Never Used     Comment: intermittent cigar use  . Alcohol use 0.0 oz/week     Comment: occasional     Current Facility-Administered Medications:  .  0.9 %  sodium chloride infusion, 500 mL, Intravenous, Continuous, Mallie Mussel  Laurian Brim III, MD  Current Outpatient Prescriptions:  .  acetaminophen (TYLENOL) 500 MG tablet, Take 500 mg by mouth every 6 (six) hours as needed. Reported on 06/16/2015, Disp: , Rfl:  .  allopurinol (ZYLOPRIM) 100 MG tablet, Take 1 tablet (100 mg total) by mouth daily. (Patient not taking: Reported on 05/02/2016), Disp: 90 tablet, Rfl: 3 .  carvedilol (COREG) 25 MG tablet, Take 0.5 tablets (12.5 mg total) by mouth 2 (two) times daily with a meal., Disp: 180 tablet, Rfl: 3 .  chlorthalidone (HYGROTON) 25 MG tablet, Take 0.5-1 tablets (12.5-25 mg total) by mouth daily., Disp: 30 tablet, Rfl: 5 .  Cholecalciferol (VITAMIN D) 2000 UNITS CAPS, Take by mouth daily.  , Disp: , Rfl:  .  colchicine (COLCRYS) 0.6 MG tablet, Take 2 tablets at first sign of gout, then once daily until attack resolves (7-10 days max), Disp: 30 tablet, Rfl: 2 .   diclofenac sodium (VOLTAREN) 1 % GEL, Apply 1 application topically 4 (four) times daily., Disp: 100 g, Rfl: 0 .  diltiazem (CARTIA XT) 180 MG 24 hr capsule, Take 1 capsule (180 mg total) by mouth 2 (two) times daily., Disp: 180 capsule, Rfl: 3 .  dorzolamide (TRUSOPT) 2 % ophthalmic solution, , Disp: , Rfl: 2 .  fexofenadine (ALLEGRA) 180 MG tablet, Take 180 mg by mouth., Disp: , Rfl:  .  fluticasone (FLONASE) 50 MCG/ACT nasal spray, Place 2 sprays into both nostrils daily., Disp: 48 g, Rfl: 3 .  Glucosamine-Chondroit-Vit C-Mn (GLUCOSAMINE-CHONDROITIN) CAPS, Take 1,500 mg by mouth., Disp: , Rfl:  .  glucose blood (FREESTYLE LITE) test strip, Use to check blood sugars daily. Dx: E11.9, Disp: 100 each, Rfl: 12 .  latanoprost (XALATAN) 0.005 % ophthalmic solution, Place 1 drop into both eyes at bedtime. , Disp: , Rfl:  .  levothyroxine (SYNTHROID) 50 MCG tablet, Take 1 tablet (50 mcg total) by mouth daily., Disp: 90 tablet, Rfl: 3 .  methocarbamol (ROBAXIN) 750 MG tablet, Take 1 tablet (750 mg total) by mouth every 8 (eight) hours as needed for muscle spasms., Disp: 60 tablet, Rfl: 3 .  Multiple Vitamin (MULTIVITAMIN) tablet, Take 1 tablet by mouth daily.  , Disp: , Rfl:  .  Omega-3 Fatty Acids (FISH OIL) 1200 MG CAPS, Take by mouth daily.  , Disp: , Rfl:  .  omeprazole (PRILOSEC) 20 MG capsule, Take 20 mg by mouth 2 (two) times daily before a meal. , Disp: , Rfl:  .  Potassium Chloride ER 20 MEQ TBCR, Take one tablet, 20 mEq daily., Disp: 90 tablet, Rfl: 3 .  pravastatin (PRAVACHOL) 40 MG tablet, Take 1 tablet (40 mg total) by mouth daily., Disp: 90 tablet, Rfl: 3 .  quinapril (ACCUPRIL) 40 MG tablet, Take 1 tablet (40 mg total) by mouth daily., Disp: 90 tablet, Rfl: 3 .  timolol (TIMOPTIC) 0.5 % ophthalmic solution, , Disp: , Rfl: 1  Allergies  Allergen Reactions  . Aspirin Other (See Comments)    Stomach ulcers  . Nsaids Other (See Comments)    Stomach ulcers  . Amoxicillin-Pot Clavulanate  Rash  . Sulfa Antibiotics Swelling and Rash     ROS  As noted in HPI.   Physical Exam  BP (!) 134/56 (BP Location: Right Arm)   Pulse (!) 59   Temp 97.9 F (36.6 C) (Oral)   Resp 16   SpO2 97%   Constitutional: Well developed, well nourished, no acute distress Eyes:  EOMI, conjunctiva normal bilaterally HENT: Normocephalic, atraumatic,mucus membranes  moist Respiratory: Normal inspiratory effort Cardiovascular: Normal rate GI: nondistended skin: No rash, skin intact Musculoskeletal: Right head of the distal fifth metatarsal tender with a callus.  midfoot NT. Base of fifth metatarsal NT. No bruising. Skin intact. DP 2+. Refill less than 2 seconds. Sensation grossly intact. Patient able to move all toes actively.  Pain with inversion / eversion,  dorsiflexion / plantarflexion. No Tenderness along the plantar fascia. Distal fibula NT, Medial malleolus NT,  Deltoid ligament NT, Lateral ligaments NT, Achilles NT. Patient able to bear weight while in department. Neurologic: Alert & oriented x 3, no focal neuro deficits Psychiatric: Speech and behavior appropriate   ED Course   Medications - No data to display  Orders Placed This Encounter  Procedures  . DG Foot Complete Right    Standing Status:   Standing    Number of Occurrences:   1    Order Specific Question:   Reason for Exam (SYMPTOM  OR DIAGNOSIS REQUIRED)    Answer:   Foot Pain    No results found for this or any previous visit (from the past 24 hour(s)). Dg Foot Complete Right  Result Date: 06/12/2016 CLINICAL DATA:  Right foot pain x1 week EXAM: RIGHT FOOT COMPLETE - 3+ VIEW COMPARISON:  None. FINDINGS: No fracture or dislocation is seen. Moderate degenerative changes the 1st MTP joint. Possible marginal erosion, equivocal. Mild degenerative changes of the dorsal midfoot. Mild moderate degenerative changes the tibiotalar joint. The visualized soft tissues are unremarkable. IMPRESSION: No acute osseus abnormality is  seen. Mild-to-moderate degenerative changes, as above. Electronically Signed   By: Julian Hy M.D.   On: 06/12/2016 13:52    ED Clinical Impression  Foot pain, right   ED Assessment/Plan  Reviewed imaging independently. No acute fractures. Lower moderate degenerative changes in the first MCP joint, dorsal midfoot and tibiotalar joint. See radiology report for details.  The differential is a  neuroma, could be pain from the callus. He does not have a whole lot of soft tissue padding in this area. It does not appear be any infection or fracture on x-ray. Could be atypical gout, as he states he gets his gout in the left fifth metatarsal joint, and it rarely gets red, hot or significantly swollen. Plan is have him increase his allopurinol 200 mg by mouth daily, Voltaren gel, increase Tylenol to thousand milligrams 3-4 times a day, follow-up with [facility] if no better in a week. To the ER for any signs of vascular compromise.  Discussed imaging, MDM, plan and followup with patient. Discussed sn/sx that should prompt return to the ED. Patient agrees with plan.   Meds ordered this encounter  Medications  . diclofenac sodium (VOLTAREN) 1 % GEL    Sig: Apply 1 application topically 4 (four) times daily.    Dispense:  100 g    Refill:  0    *This clinic note was created using [employer]. Therefore, there may be occasional mistakes despite careful proofreading.  ?   Melynda Ripple, MD 06/12/16 1510

## 2016-06-28 ENCOUNTER — Ambulatory Visit (INDEPENDENT_AMBULATORY_CARE_PROVIDER_SITE_OTHER): Payer: Medicare HMO | Admitting: Podiatry

## 2016-06-28 ENCOUNTER — Ambulatory Visit: Payer: Medicare HMO

## 2016-06-28 ENCOUNTER — Encounter: Payer: Self-pay | Admitting: Podiatry

## 2016-06-28 DIAGNOSIS — R52 Pain, unspecified: Secondary | ICD-10-CM

## 2016-06-28 DIAGNOSIS — T300 Burn of unspecified body region, unspecified degree: Secondary | ICD-10-CM | POA: Diagnosis not present

## 2016-06-28 DIAGNOSIS — M129 Arthropathy, unspecified: Secondary | ICD-10-CM
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
## 2016-06-28 NOTE — Progress Notes (Signed)
   Subjective:    Patient ID: Eddie Ferguson, male    DOB: 30-Jul-1940, 76 y.o.   MRN: [id number]  HPI    Review of Systems  All other systems reviewed and are negative.      Objective:   Physical Exam        Assessment & Plan:

## 2016-07-18 DIAGNOSIS — H401111 Primary open-angle glaucoma, right eye, mild stage: Secondary | ICD-10-CM | POA: Diagnosis not present

## 2016-07-27 DIAGNOSIS — D492 Neoplasm of unspecified behavior of bone, soft tissue, and skin: Secondary | ICD-10-CM | POA: Diagnosis not present

## 2016-08-09 ENCOUNTER — Ambulatory Visit (INDEPENDENT_AMBULATORY_CARE_PROVIDER_SITE_OTHER): Payer: Medicare HMO | Admitting: Family Medicine

## 2016-08-09 ENCOUNTER — Encounter: Payer: Self-pay | Admitting: Family Medicine

## 2016-08-09 ENCOUNTER — Other Ambulatory Visit: Payer: Self-pay

## 2016-08-09 ENCOUNTER — Telehealth: Payer: Self-pay | Admitting: Family Medicine

## 2016-08-09 VITALS — BP 138/70 | HR 61 | Temp 98.7°F | Ht 68.0 in | Wt 219.0 lb

## 2016-08-09 DIAGNOSIS — B9689 Other specified bacterial agents as the cause of diseases classified elsewhere: Secondary | ICD-10-CM

## 2016-08-09 DIAGNOSIS — J329 Chronic sinusitis, unspecified: Secondary | ICD-10-CM | POA: Diagnosis not present

## 2016-08-09 MED ORDER — DOXYCYCLINE HYCLATE 100 MG PO TABS: 100.0000 mg | ORAL_TABLET | Freq: Two times a day (BID) | ORAL | 0 refills | Status: DC

## 2016-08-09 NOTE — Progress Notes (Signed)
Subjective:  Eddie Ferguson. Eddie Ferguson is a 76 y.o. year old very pleasant male patient who presents for/with See problem oriented charting ROS- nighttime sweatiness, temperatures into 99 most evenings, sinus pressure, mild cough, some throat drainage, minimal sinus drainage.    Past Medical History-  Patient Active Problem List   Diagnosis Date Noted  . Prostate cancer ([place]) 05/28/2014    Priority: High  . Diabetes mellitus type 2, controlled ([place]) 04/23/2014    Priority: High  . Gout 07/09/2015    Priority: Medium  . Short-term memory loss 06/16/2015    Priority: Medium  . Essential hypertension 04/23/2014    Priority: Medium  . Hyperlipidemia 04/23/2014    Priority: Medium  . Hypothyroidism 04/23/2014    Priority: Medium  . Stomach ulcer     Priority: Medium  . Adenomatous colon polyp 05/28/2014    Priority: Low  . Osteoarthritis 05/28/2014    Priority: Low  . Erectile dysfunction 05/28/2014    Priority: Low  . Basal cell carcinoma 05/28/2014    Priority: Low  . GERD (gastroesophageal reflux disease) 04/23/2014    Priority: Low  . Allergic rhinitis 04/23/2014    Priority: Low  . Glaucoma 04/23/2014    Priority: Low    Medications- reviewed and updated Current Outpatient Prescriptions  Medication Sig Dispense Refill  . acetaminophen (TYLENOL) 500 MG tablet Take 500 mg by mouth every 6 (six) hours as needed. Reported on 06/16/2015    . allopurinol (ZYLOPRIM) 100 MG tablet Take 1 tablet (100 mg total) by mouth daily. 90 tablet 3  . carvedilol (COREG) 25 MG tablet Take 0.5 tablets (12.5 mg total) by mouth 2 (two) times daily with a meal. 180 tablet 3  . chlorthalidone (HYGROTON) 25 MG tablet Take 0.5-1 tablets (12.5-25 mg total) by mouth daily. 30 tablet 5  . Cholecalciferol (VITAMIN D) 2000 UNITS CAPS Take by mouth daily.      . colchicine (COLCRYS) 0.6 MG tablet Take 2 tablets at first sign of gout, then once daily until attack resolves (7-10 days max) 30 tablet 2  .  diclofenac sodium (VOLTAREN) 1 % GEL Apply 1 application topically 4 (four) times daily. 100 g 0  . diltiazem (CARTIA XT) 180 MG 24 hr capsule Take 1 capsule (180 mg total) by mouth 2 (two) times daily. 180 capsule 3  . fexofenadine (ALLEGRA) 180 MG tablet Take 180 mg by mouth.    . fluticasone (FLONASE) 50 MCG/ACT nasal spray Place 2 sprays into both nostrils daily. 48 g 3  . Glucosamine-Chondroit-Vit C-Mn (GLUCOSAMINE-CHONDROITIN) CAPS Take 1,500 mg by mouth.    Marland Kitchen glucose blood (FREESTYLE LITE) test strip Use to check blood sugars daily. Dx: E11.9 100 each 12  . KLOR-CON M20 20 MEQ tablet TAKE 1 TABLET EVERY DAY 90 tablet 0  . latanoprost (XALATAN) 0.005 % ophthalmic solution Place 1 drop into both eyes at bedtime.     Marland Kitchen levothyroxine (SYNTHROID) 50 MCG tablet Take 1 tablet (50 mcg total) by mouth daily. 90 tablet 3  . methocarbamol (ROBAXIN) 750 MG tablet Take 1 tablet (750 mg total) by mouth every 8 (eight) hours as needed for muscle spasms. 60 tablet 3  . Multiple Vitamin (MULTIVITAMIN) tablet Take 1 tablet by mouth daily.      . Omega-3 Fatty Acids (FISH OIL) 1200 MG CAPS Take by mouth daily.      Marland Kitchen omeprazole (PRILOSEC) 20 MG capsule Take 20 mg by mouth 2 (two) times daily before a meal.     .  Potassium Chloride ER 20 MEQ TBCR Take one tablet, 20 mEq daily. 90 tablet 3  . pravastatin (PRAVACHOL) 40 MG tablet Take 1 tablet (40 mg total) by mouth daily. 90 tablet 3  . quinapril (ACCUPRIL) 40 MG tablet Take 1 tablet (40 mg total) by mouth daily. 90 tablet 3   Current Facility-Administered Medications  Medication Dose Route Frequency Provider Last Rate Last Dose  . 0.9 %  sodium chloride infusion  500 mL Intravenous Continuous Danis, Estill Cotta III, MD        Objective: BP 138/70 (BP Location: Left Arm, Patient Position: Sitting, Cuff Size: Large)   Pulse 61   Temp 98.7 F (37.1 C) (Oral)   Ht 5\' 8"  (1.727 m)   Wt 219 lb (99.3 kg)   SpO2 94%   BMI 33.30 kg/m  Gen: NAD, resting  comfortably TM normal, oropharynx largely normal except light amount of drainage, turbinates erythematous with yellow drainage.  CV: RRR no murmurs rubs or gallops Lungs: CTAB no crackles, wheeze, rhonchi Abdomen: soft/nontender/nondistended/normal bowel sounds. obese Ext: no edema Skin: warm, dry, no rash  Assessment/Plan:  Bacterial sinusitis S: Feels good in the morning and then starts to feel exhausted by afternoon. Feels sinus pressure. No discharge from nose. Minimal cough. No chest congestion. Last week was having temperatures around low 99s by evening time most nights. Some night sweats. No chills. No chest pain.   Wife with pneumonia since 2-3 weeks ago- finished 10 day antibiotics last week  Allegra and flonase takes regularly for allergies A/P: 3 weeks of sinus pressure, fatigue, low grade temperatures- will treat for potential bacterial sinus infection. He is worried because wife has pneumonia but his lung exam is clear. We discussed if not better in 8-10 days to return to care- would likely get bloodwork under fatigue (tsh, cbc diff, cmp) as well as chest x-ray under cough at that time. With night sweats and elevated evening temperatures something like leukemia possible but still think lower likelihood than infectious cause. Tells mehe has a long history of sinusitis issues and current episodes feels somewhat similar. j  8-10 days follow up if not improved or sooner if needed  Meds ordered this encounter  Medications  . doxycycline (VIBRA-TABS) 100 MG tablet    Sig: Take 1 tablet (100 mg total) by mouth 2 (two) times daily.    Dispense:  14 tablet    Refill:  0    Return precautions advised.  Garret Reddish, MD

## 2016-08-09 NOTE — Telephone Encounter (Signed)
Prescription sent to [facility] on [address]

## 2016-08-09 NOTE — Telephone Encounter (Signed)
° ° ° ° °  The below RX was sent to mail order and should have been sent to [facility] [address]     doxycycline (VIBRA-TABS) 100 MG tablet    [place]

## 2016-08-09 NOTE — Patient Instructions (Signed)
Trial doxycycline for potential sinus infection as cause of symptoms. 1 week treatment. See me back in 8-10 days if not improved- would likely get blood work and chest x-ray

## 2016-08-24 ENCOUNTER — Other Ambulatory Visit: Payer: Self-pay | Admitting: Family Medicine

## 2016-09-28 ENCOUNTER — Other Ambulatory Visit: Payer: Self-pay

## 2016-09-28 ENCOUNTER — Ambulatory Visit (INDEPENDENT_AMBULATORY_CARE_PROVIDER_SITE_OTHER): Payer: Medicare HMO | Admitting: Family Medicine

## 2016-09-28 ENCOUNTER — Encounter: Payer: Self-pay | Admitting: Family Medicine

## 2016-09-28 VITALS — BP 140/68 | HR 56 | Temp 98.1°F | Ht 68.0 in | Wt 218.2 lb

## 2016-09-28 DIAGNOSIS — E034 Atrophy of thyroid (acquired): Secondary | ICD-10-CM | POA: Diagnosis not present

## 2016-09-28 DIAGNOSIS — E119 Type 2 diabetes mellitus without complications: Secondary | ICD-10-CM | POA: Diagnosis not present

## 2016-09-28 DIAGNOSIS — K219 Gastro-esophageal reflux disease without esophagitis: Secondary | ICD-10-CM | POA: Diagnosis not present

## 2016-09-28 DIAGNOSIS — E785 Hyperlipidemia, unspecified: Secondary | ICD-10-CM | POA: Diagnosis not present

## 2016-09-28 DIAGNOSIS — R413 Other amnesia: Secondary | ICD-10-CM

## 2016-09-28 DIAGNOSIS — M1A0720 Idiopathic chronic gout, left ankle and foot, without tophus (tophi): Secondary | ICD-10-CM | POA: Diagnosis not present

## 2016-09-28 DIAGNOSIS — Z79899 Other long term (current) drug therapy: Secondary | ICD-10-CM | POA: Diagnosis not present

## 2016-09-28 DIAGNOSIS — I10 Essential (primary) hypertension: Secondary | ICD-10-CM | POA: Diagnosis not present

## 2016-09-28 LAB — BASIC METABOLIC PANEL
BUN: 16 mg/dL (ref 6–23)
CALCIUM: 9.6 mg/dL (ref 8.4–10.5)
CHLORIDE: 97 meq/L (ref 96–112)
CO2: 35 meq/L — AB (ref 19–32)
CREATININE: 1.39 mg/dL (ref 0.40–1.50)
GFR: 52.48 mL/min — ABNORMAL LOW (ref >60.00)
Glucose, Bld: 140 mg/dL — ABNORMAL HIGH (ref 70–99)
Potassium: 3.5 mEq/L (ref 3.5–5.1)
Sodium: 139 mEq/L (ref 135–145)

## 2016-09-28 LAB — VITAMIN B12: VITAMIN B 12: 697 pg/mL (ref 211–911)

## 2016-09-28 LAB — LDL CHOLESTEROL, DIRECT: LDL DIRECT: 90 mg/dL

## 2016-09-28 LAB — HEMOGLOBIN A1C: Hgb A1c MFr Bld: 6.6 % — ABNORMAL HIGH (ref 4.6–6.5)

## 2016-09-28 LAB — TSH: TSH: 3.06 u[IU]/mL (ref 0.35–4.50)

## 2016-09-28 LAB — URIC ACID: Uric Acid, Serum: 9.2 mg/dL — ABNORMAL HIGH (ref 4.0–7.8)

## 2016-09-28 MED ORDER — ALLOPURINOL 100 MG PO TABS: 100.0000 mg | ORAL_TABLET | Freq: Every day | ORAL | 1 refills | Status: DC

## 2016-09-28 MED ORDER — FLUTICASONE PROPIONATE 50 MCG/ACT NA SUSP: 2.0000 | Freq: Every day | NASAL | 3 refills | Status: DC

## 2016-09-28 MED ORDER — CHLORTHALIDONE 25 MG PO TABS: 12.5000 mg | ORAL_TABLET | Freq: Every day | ORAL | 2 refills | Status: DC

## 2016-09-28 MED ORDER — METHOCARBAMOL 750 MG PO TABS: 750.0000 mg | ORAL_TABLET | Freq: Three times a day (TID) | ORAL | 3 refills | Status: DC | PRN

## 2016-09-28 MED ORDER — POTASSIUM CHLORIDE CRYS ER 20 MEQ PO TBCR: 20.0000 meq | EXTENDED_RELEASE_TABLET | Freq: Every day | ORAL | 2 refills | Status: DC

## 2016-09-28 NOTE — Assessment & Plan Note (Signed)
On synthroid 50 mcg- update tsh

## 2016-09-28 NOTE — Assessment & Plan Note (Signed)
No flares- check uric acid on allopurinol 100mg 

## 2016-09-28 NOTE — Assessment & Plan Note (Signed)
S: hopefully controlled. On no meds CBGs- last visit was 117 after coming off actos. 116-131 over last few weeks - checks about once a week Exercise and diet- weight stable. Not exercising due to arthritis.  Lab Results  Component Value Date   HGBA1C 6.2 03/18/2016   HGBA1C 6.1 12/16/2015   HGBA1C 6.5 07/30/2015   A/P: no rx right now. Update a1c

## 2016-09-28 NOTE — Patient Instructions (Addendum)
Please stop by lab before you go  Trial zantac instead of prilosec- as prilosec can cause low b12 and some associations with memory loss  Memory test was stable- we should repeat at least yearly but with how feels like things are changing possibly 6 months- we will check b12 today to make sure not contributing

## 2016-09-28 NOTE — Assessment & Plan Note (Addendum)
S: feels like short term memory is worsening. MMSE 28/30 06/16/15. Long term memory no issues. Seems to have some issues everyday. May take him longer to figure out how to get somewhere but has not been getting lost- perhaps missed just a few turns.  A/P: MMSE stable today at 28/30 (loses 2 for 3 word recall). Update b12. Doubt HIV or syphilis- monogamous, no needle sticks. Discussed repeating every 6 months

## 2016-09-28 NOTE — Assessment & Plan Note (Signed)
S: well controlled on pravastatin 40mg  with LDL at least under 100. No myalgias.  Lab Results  Component Value Date   CHOL 177 12/16/2015   HDL 61.00 12/16/2015   LDLCALC 95 12/16/2015   LDLDIRECT 91.0 04/23/2014   TRIG 105.0 12/16/2015   CHOLHDL 3 12/16/2015   A/P: update LDL today

## 2016-09-28 NOTE — Progress Notes (Signed)
Subjective:  Eddie Ferguson is a 76 y.o. year old very pleasant male patient who presents for/with See problem oriented charting ROS- No chest pain or shortness of breath. No headache or blurry vision. Admits to some memory loss   Past Medical History-  Patient Active Problem List   Diagnosis Date Noted  . Prostate cancer ([place]) 05/28/2014    Priority: High  . Diabetes mellitus type 2, controlled ([place]) 04/23/2014    Priority: High  . Gout 07/09/2015    Priority: Medium  . Short-term memory loss 06/16/2015    Priority: Medium  . Essential hypertension 04/23/2014    Priority: Medium  . Hyperlipidemia 04/23/2014    Priority: Medium  . Hypothyroidism 04/23/2014    Priority: Medium  . Stomach ulcer     Priority: Medium  . Adenomatous colon polyp 05/28/2014    Priority: Low  . Osteoarthritis 05/28/2014    Priority: Low  . Erectile dysfunction 05/28/2014    Priority: Low  . Basal cell carcinoma 05/28/2014    Priority: Low  . GERD (gastroesophageal reflux disease) 04/23/2014    Priority: Low  . Allergic rhinitis 04/23/2014    Priority: Low  . Glaucoma 04/23/2014    Priority: Low    Medications- reviewed and updated Current Outpatient Prescriptions  Medication Sig Dispense Refill  . acetaminophen (TYLENOL) 500 MG tablet Take 500 mg by mouth every 6 (six) hours as needed. Reported on 06/16/2015    . allopurinol (ZYLOPRIM) 100 MG tablet TAKE 1 TABLET EVERY DAY 90 tablet 1  . carvedilol (COREG) 25 MG tablet Take 0.5 tablets (12.5 mg total) by mouth 2 (two) times daily with a meal. 180 tablet 3  . chlorthalidone (HYGROTON) 25 MG tablet Take 0.5-1 tablets (12.5-25 mg total) by mouth daily. 30 tablet 5  . Cholecalciferol (VITAMIN D) 2000 UNITS CAPS Take by mouth daily.      . colchicine (COLCRYS) 0.6 MG tablet Take 2 tablets at first sign of gout, then once daily until attack resolves (7-10 days max) 30 tablet 2  . diltiazem (CARTIA XT) 180 MG 24 hr capsule Take 1 capsule (180 mg  total) by mouth 2 (two) times daily. 180 capsule 3  . fexofenadine (ALLEGRA) 180 MG tablet Take 180 mg by mouth.    . fluticasone (FLONASE) 50 MCG/ACT nasal spray Place 2 sprays into both nostrils daily. 48 g 3  . Glucosamine-Chondroit-Vit C-Mn (GLUCOSAMINE-CHONDROITIN) CAPS Take 1,500 mg by mouth.    Marland Kitchen glucose blood (FREESTYLE LITE) test strip Use to check blood sugars daily. Dx: E11.9 100 each 12  . KLOR-CON M20 20 MEQ tablet TAKE 1 TABLET EVERY DAY 90 tablet 0  . latanoprost (XALATAN) 0.005 % ophthalmic solution Place 1 drop into both eyes at bedtime.     Marland Kitchen levothyroxine (SYNTHROID) 50 MCG tablet Take 1 tablet (50 mcg total) by mouth daily. 90 tablet 3  . methocarbamol (ROBAXIN) 750 MG tablet Take 1 tablet (750 mg total) by mouth every 8 (eight) hours as needed for muscle spasms. 60 tablet 3  . Multiple Vitamin (MULTIVITAMIN) tablet Take 1 tablet by mouth daily.      . Omega-3 Fatty Acids (FISH OIL) 1200 MG CAPS Take by mouth daily.      Marland Kitchen omeprazole (PRILOSEC) 20 MG capsule Take 20 mg by mouth 2 (two) times daily before a meal.     . pravastatin (PRAVACHOL) 40 MG tablet Take 1 tablet (40 mg total) by mouth daily. 90 tablet 3  . quinapril (ACCUPRIL) 40  MG tablet Take 1 tablet (40 mg total) by mouth daily. 90 tablet 3   Objective: BP 140/68 (BP Location: Left Arm, Patient Position: Sitting, Cuff Size: Large)   Pulse (!) 56   Temp 98.1 F (36.7 C) (Oral)   Ht 5\' 8"  (1.727 m)   Wt 218 lb 3.2 oz (99 kg)   SpO2 94%   BMI 33.18 kg/m  Gen: NAD, resting comfortably CV: RRR no murmurs rubs or gallops Lungs: CTAB no crackles, wheeze, rhonchi Abdomen: soft/nontender/nondistended/normal bowel sounds. obese Ext: no edema Skin: warm, dry Diabetic Foot Exam - Simple   Simple Foot Form Diabetic Foot exam was performed with the following findings:  Yes 09/28/2016 10:36 AM  Visual Inspection No deformities, no ulcerations, no other skin breakdown bilaterally:  Yes Sensation Testing Intact to  touch and monofilament testing bilaterally:  Yes Pulse Check Posterior Tibialis and Dorsalis pulse intact bilaterally:  Yes Comments    Assessment/Plan:  Encouraged healthy eating and at least a few lbs weigh tloss by follow up. Consider full neuro exam at follow up.   Essential hypertension S: controlled at home on  coreg 12.5mg  diltiazem 180mg  XL, quinapril 40mg , chlorthalidone 25 mg. 40 day home average at 137/65. Slightly poor control in office  Last visit, We added chlorthalidone 12.5mg  then he went up to 25mg  (changed from hctz 25 mg as had fatigue). Could change diltiazem to amlodipine if needed. He feels fatigue is better than with HCTZ BP Readings from Last 3 Encounters:  09/28/16 140/68  08/09/16 138/70  06/12/16 (!) 134/56  A/P: We discussed blood pressure goal of <140/90. Continue current meds- as long as home #s at goal- has new cuff so should verify that next visit  Diabetes mellitus type 2, controlled ([place]) S: hopefully controlled. On no meds CBGs- last visit was 117 after coming off actos. 116-131 over last few weeks - checks about once a week Exercise and diet- weight stable. Not exercising due to arthritis.  Lab Results  Component Value Date   HGBA1C 6.2 03/18/2016   HGBA1C 6.1 12/16/2015   HGBA1C 6.5 07/30/2015   A/P: no rx right now. Update a1c  Hyperlipidemia S: well controlled on pravastatin 40mg  with LDL at least under 100. No myalgias.  Lab Results  Component Value Date   CHOL 177 12/16/2015   HDL 61.00 12/16/2015   LDLCALC 95 12/16/2015   LDLDIRECT 91.0 04/23/2014   TRIG 105.0 12/16/2015   CHOLHDL 3 12/16/2015   A/P: update LDL today  Gout No flares- check uric acid on allopurinol 100mg   Hypothyroidism On synthroid 50 mcg- update tsh  GERD (gastroesophageal reflux disease) Omeprazole--> zantac. Due to possibility for low b12 and possible links ot memory loss  Short-term memory loss S: feels like short term memory is worsening. MMSE 28/30  06/16/15. Long term memory no issues. Seems to have some issues everyday. May take him longer to figure out how to get somewhere but has not been getting lost- perhaps missed just a few turns.  A/P: MMSE stable today at 28/30 (loses 2 for 3 word recall). Update b12. Doubt HIV- monogamous, no needle sticks. Discussed repeating every 6 months  6 months  Orders Placed This Encounter  Procedures  . Basic metabolic panel    [place]  . LDL cholesterol, direct    [place]  . Hemoglobin A1c    [place]  . TSH    [place]  . Uric Acid  . Vitamin B12   Return precautions advised.  Garret Reddish,  MD   

## 2016-09-28 NOTE — Assessment & Plan Note (Signed)
Omeprazole--> zantac. Due to possibility for low b12 and possible links ot memory loss

## 2016-09-28 NOTE — Assessment & Plan Note (Signed)
S: controlled at home on  coreg 12.5mg  diltiazem 180mg  XL, quinapril 40mg , chlorthalidone 25 mg. 40 day home average at 137/65. Slightly poor control in office  Last visit, We added chlorthalidone 12.5mg  then he went up to 25mg  (changed from hctz 25 mg as had fatigue). Could change diltiazem to amlodipine if needed. He feels fatigue is better than with HCTZ BP Readings from Last 3 Encounters:  09/28/16 140/68  08/09/16 138/70  06/12/16 (!) 134/56  A/P: We discussed blood pressure goal of <140/90. Continue current meds- as long as home #s at goal- has new cuff so should verify that next visit

## 2016-10-03 ENCOUNTER — Encounter: Payer: Self-pay | Admitting: Family Medicine

## 2016-10-04 ENCOUNTER — Other Ambulatory Visit: Payer: Self-pay

## 2016-10-04 DIAGNOSIS — M109 Gout, unspecified: Secondary | ICD-10-CM

## 2016-10-04 MED ORDER — ALLOPURINOL 100 MG PO TABS: 100.0000 mg | ORAL_TABLET | Freq: Two times a day (BID) | ORAL | 1 refills | Status: DC

## 2016-10-18 ENCOUNTER — Telehealth: Payer: Self-pay | Admitting: Family Medicine

## 2016-10-18 NOTE — Telephone Encounter (Signed)
Pt has AWV scheduled for 8/15 - pt is not due until Feb of next year.  First vm left 8/8.   2nd attempt to reach pt - 8/14 - lmom for him to call us back so we can acknowledge that he got my message about canceling his appt.

## 2016-10-19 ENCOUNTER — Ambulatory Visit: Payer: Medicare HMO

## 2016-10-21 DIAGNOSIS — M79676 Pain in unspecified toe(s): Secondary | ICD-10-CM

## 2016-11-08 ENCOUNTER — Other Ambulatory Visit: Payer: Self-pay

## 2016-11-08 ENCOUNTER — Ambulatory Visit (INDEPENDENT_AMBULATORY_CARE_PROVIDER_SITE_OTHER): Payer: Medicare HMO | Admitting: Family Medicine

## 2016-11-08 ENCOUNTER — Encounter: Payer: Self-pay | Admitting: Family Medicine

## 2016-11-08 DIAGNOSIS — M1A0720 Idiopathic chronic gout, left ankle and foot, without tophus (tophi): Secondary | ICD-10-CM | POA: Diagnosis not present

## 2016-11-08 DIAGNOSIS — M109 Gout, unspecified: Secondary | ICD-10-CM | POA: Diagnosis not present

## 2016-11-08 DIAGNOSIS — E119 Type 2 diabetes mellitus without complications: Secondary | ICD-10-CM

## 2016-11-08 DIAGNOSIS — I10 Essential (primary) hypertension: Secondary | ICD-10-CM

## 2016-11-08 LAB — URIC ACID: Uric Acid, Serum: 7.1 mg/dL (ref 4.0–7.8)

## 2016-11-08 MED ORDER — GLUCOSE BLOOD VI STRP: ORAL_STRIP | 12 refills | Status: DC

## 2016-11-08 NOTE — Addendum Note (Signed)
Addended by: Wyvonne Lenz on: 11/08/2016 03:10 PM   Modules accepted: Orders

## 2016-11-08 NOTE — Assessment & Plan Note (Addendum)
S: controlled on coreg 12.5mg  BID, diltiazem 180mg  XL, quinapril 40mg , chlorthalidone 25mg .   Home #s over 32 days has been 139/66 up slightly from last 30 day average of 137/65 but still at goal.  BP Readings from Last 3 Encounters:  11/08/16 (!) 118/56  09/28/16 140/68  08/09/16 138/70  A/P: We discussed blood pressure goal of <140/90. Continue current meds:  Home #s close to goal but in office # much improve. dIf needed in future could change diltiazem to amlodipine.

## 2016-11-08 NOTE — Patient Instructions (Addendum)
Please stop by lab before you go  Call for a flu shot in October- come for nurse visit  ______________________________________________________________________  Starting October 1st 2018, I will be transferring to our new location:  I would love to have you remain my patient at this new location as long as it remains convenient for you. I am excited about the opportunity to have x-ray and sports medicine in the new building but will really miss the awesome staff and physicians at [facility]. Continue to schedule appointments at [facility] and we will automatically transfer them to the [place] location starting October 1st.

## 2016-11-08 NOTE — Assessment & Plan Note (Signed)
S: well controlled but slightly worse on last check by a1c. Wife broke her wrist - and has not been as active since then.  CBGs- 30 day average of 119 for blood sugars Exercise and diet- exercise down some Lab Results  Component Value Date   HGBA1C 6.6 (H) 09/28/2016   HGBA1C 6.2 03/18/2016   HGBA1C 6.1 12/16/2015   A/P: continue without rx as long as a1c under 7

## 2016-11-08 NOTE — Assessment & Plan Note (Signed)
S: last uric acid was over 9- we increased allopurinol to 200mg  daily.  A/P: repeat uric acid today- consider 300mg  if uric acid above 6.5. Fortunately no flares

## 2016-11-08 NOTE — Progress Notes (Signed)
Subjective:  Eddie Ferguson. Eddie Ferguson is a 76 y.o. year old very pleasant male patient who presents for/with See problem oriented charting ROS- No chest pain or shortness of breath. No headache or blurry vision.  Continued mild issues with memory0 monitoring   Past Medical History-  Patient Active Problem List   Diagnosis Date Noted  . Prostate cancer ([place]) 05/28/2014    Priority: High  . Diabetes mellitus type 2, controlled ([place]) 04/23/2014    Priority: High  . Gout 07/09/2015    Priority: Medium  . Short-term memory loss 06/16/2015    Priority: Medium  . Essential hypertension 04/23/2014    Priority: Medium  . Hyperlipidemia 04/23/2014    Priority: Medium  . Hypothyroidism 04/23/2014    Priority: Medium  . Stomach ulcer     Priority: Medium  . Adenomatous colon polyp 05/28/2014    Priority: Low  . Osteoarthritis 05/28/2014    Priority: Low  . Erectile dysfunction 05/28/2014    Priority: Low  . Basal cell carcinoma 05/28/2014    Priority: Low  . GERD (gastroesophageal reflux disease) 04/23/2014    Priority: Low  . Allergic rhinitis 04/23/2014    Priority: Low  . Glaucoma 04/23/2014    Priority: Low    Medications- reviewed and updated Current Outpatient Prescriptions  Medication Sig Dispense Refill  . acetaminophen (TYLENOL) 500 MG tablet Take 500 mg by mouth every 6 (six) hours as needed. Reported on 06/16/2015    . allopurinol (ZYLOPRIM) 100 MG tablet Take 1 tablet (100 mg total) by mouth 2 (two) times daily. 180 tablet 1  . carvedilol (COREG) 25 MG tablet Take 0.5 tablets (12.5 mg total) by mouth 2 (two) times daily with a meal. 180 tablet 3  . chlorthalidone (HYGROTON) 25 MG tablet Take 0.5-1 tablets (12.5-25 mg total) by mouth daily. 90 tablet 2  . Cholecalciferol (VITAMIN D) 2000 UNITS CAPS Take by mouth daily.      . colchicine (COLCRYS) 0.6 MG tablet Take 2 tablets at first sign of gout, then once daily until attack resolves (7-10 days max) 30 tablet 2  . diltiazem  (CARTIA XT) 180 MG 24 hr capsule Take 1 capsule (180 mg total) by mouth 2 (two) times daily. 180 capsule 3  . fexofenadine (ALLEGRA) 180 MG tablet Take 180 mg by mouth.    . fluticasone (FLONASE) 50 MCG/ACT nasal spray Place 2 sprays into both nostrils daily. 48 g 3  . Glucosamine-Chondroit-Vit C-Mn (GLUCOSAMINE-CHONDROITIN) CAPS Take 1,500 mg by mouth.    Marland Kitchen glucose blood (FREESTYLE LITE) test strip Use to check blood sugars daily. Dx: E11.9 100 each 12  . latanoprost (XALATAN) 0.005 % ophthalmic solution Place 1 drop into both eyes at bedtime.     Marland Kitchen levothyroxine (SYNTHROID) 50 MCG tablet Take 1 tablet (50 mcg total) by mouth daily. 90 tablet 3  . methocarbamol (ROBAXIN) 750 MG tablet Take 1 tablet (750 mg total) by mouth every 8 (eight) hours as needed for muscle spasms. 60 tablet 3  . Multiple Vitamin (MULTIVITAMIN) tablet Take 1 tablet by mouth daily.      . Omega-3 Fatty Acids (FISH OIL) 1200 MG CAPS Take by mouth daily.      Marland Kitchen omeprazole (PRILOSEC) 20 MG capsule Take 20 mg by mouth 2 (two) times daily before a meal.     . potassium chloride SA (KLOR-CON M20) 20 MEQ tablet Take 1 tablet (20 mEq total) by mouth daily. 90 tablet 2  . pravastatin (PRAVACHOL) 40 MG tablet Take  1 tablet (40 mg total) by mouth daily. 90 tablet 3  . quinapril (ACCUPRIL) 40 MG tablet Take 1 tablet (40 mg total) by mouth daily. 90 tablet 3   No current facility-administered medications for this visit.     Objective: BP (!) 118/56 (BP Location: Left Arm, Patient Position: Sitting, Cuff Size: Large)   Pulse 68   Temp 98.8 F (37.1 C) (Oral)   Ht 5\' 8"  (1.727 m)   Wt 212 lb 9.6 oz (96.4 kg)   SpO2 95%   BMI 32.33 kg/m  Gen: NAD, resting comfortably CV: RRR no murmurs rubs or gallops Lungs: CTAB no crackles, wheeze, rhonchi Abdomen: soft/nontender/nondistended/normal bowel sounds. No rebound or guarding.  Ext: no edema Skin: warm, dry  Assessment/Plan:  Diabetes mellitus type 2, controlled ([place]) S: well  controlled but slightly worse on last check by a1c. Wife broke her wrist - and has not been as active since then.  CBGs- 30 day average of 119 for blood sugars Exercise and diet- exercise down some Lab Results  Component Value Date   HGBA1C 6.6 (H) 09/28/2016   HGBA1C 6.2 03/18/2016   HGBA1C 6.1 12/16/2015   A/P: continue without rx as long as a1c under 7  Essential hypertension S: controlled on coreg 12.5mg  BID, diltiazem 180mg  XL, quinapril 40mg , chlorthalidone 25mg .   Home #s over 32 days has been 139/66 up slightly from last 30 day average of 137/65 but still at goal.  BP Readings from Last 3 Encounters:  11/08/16 (!) 118/56  09/28/16 140/68  08/09/16 138/70  A/P: We discussed blood pressure goal of <140/90. Continue current meds:  Home #s close to goal but in office # much improve. dIf needed in future could change diltiazem to amlodipine.   Gout S: last uric acid was over 9- we increased allopurinol to 200mg  daily.  A/P: repeat uric acid today- consider 300mg  if uric acid above 6.5. Fortunately no flares  Return in about 6 months (around 05/08/2017) for follow up- or sooner if needed.  Return precautions advised.  Garret Reddish, MD

## 2016-11-12 ENCOUNTER — Encounter: Payer: Self-pay | Admitting: Family Medicine

## 2016-12-01 DIAGNOSIS — L814 Other melanin hyperpigmentation: Secondary | ICD-10-CM | POA: Diagnosis not present

## 2016-12-01 DIAGNOSIS — L438 Other lichen planus: Secondary | ICD-10-CM | POA: Diagnosis not present

## 2016-12-01 DIAGNOSIS — Z85828 Personal history of other malignant neoplasm of skin: Secondary | ICD-10-CM | POA: Diagnosis not present

## 2016-12-01 DIAGNOSIS — D1801 Hemangioma of skin and subcutaneous tissue: Secondary | ICD-10-CM | POA: Diagnosis not present

## 2016-12-01 DIAGNOSIS — L821 Other seborrheic keratosis: Secondary | ICD-10-CM | POA: Diagnosis not present

## 2016-12-01 DIAGNOSIS — D485 Neoplasm of uncertain behavior of skin: Secondary | ICD-10-CM | POA: Diagnosis not present

## 2016-12-01 DIAGNOSIS — L720 Epidermal cyst: Secondary | ICD-10-CM | POA: Diagnosis not present

## 2016-12-01 DIAGNOSIS — D225 Melanocytic nevi of trunk: Secondary | ICD-10-CM | POA: Diagnosis not present

## 2016-12-01 DIAGNOSIS — L570 Actinic keratosis: Secondary | ICD-10-CM | POA: Diagnosis not present

## 2016-12-20 ENCOUNTER — Ambulatory Visit (INDEPENDENT_AMBULATORY_CARE_PROVIDER_SITE_OTHER): Payer: Medicare HMO

## 2016-12-20 DIAGNOSIS — Z23 Encounter for immunization: Secondary | ICD-10-CM

## 2017-01-06 DIAGNOSIS — Z8546 Personal history of malignant neoplasm of prostate: Secondary | ICD-10-CM | POA: Diagnosis not present

## 2017-01-06 LAB — PSA: PSA: 0.11

## 2017-01-09 ENCOUNTER — Encounter: Payer: Self-pay | Admitting: Family Medicine

## 2017-01-09 ENCOUNTER — Ambulatory Visit: Payer: Medicare HMO | Admitting: Family Medicine

## 2017-01-09 VITALS — BP 120/56 | HR 58 | Temp 98.3°F | Ht 68.0 in | Wt 214.4 lb

## 2017-01-09 DIAGNOSIS — Z9849 Cataract extraction status, unspecified eye: Secondary | ICD-10-CM | POA: Diagnosis not present

## 2017-01-09 DIAGNOSIS — H401131 Primary open-angle glaucoma, bilateral, mild stage: Secondary | ICD-10-CM | POA: Diagnosis not present

## 2017-01-09 DIAGNOSIS — H524 Presbyopia: Secondary | ICD-10-CM | POA: Diagnosis not present

## 2017-01-09 DIAGNOSIS — E119 Type 2 diabetes mellitus without complications: Secondary | ICD-10-CM | POA: Diagnosis not present

## 2017-01-09 DIAGNOSIS — R413 Other amnesia: Secondary | ICD-10-CM

## 2017-01-09 DIAGNOSIS — K219 Gastro-esophageal reflux disease without esophagitis: Secondary | ICD-10-CM | POA: Diagnosis not present

## 2017-01-09 DIAGNOSIS — Z961 Presence of intraocular lens: Secondary | ICD-10-CM | POA: Diagnosis not present

## 2017-01-09 DIAGNOSIS — H5212 Myopia, left eye: Secondary | ICD-10-CM | POA: Diagnosis not present

## 2017-01-09 DIAGNOSIS — H52223 Regular astigmatism, bilateral: Secondary | ICD-10-CM | POA: Diagnosis not present

## 2017-01-09 DIAGNOSIS — H5201 Hypermetropia, right eye: Secondary | ICD-10-CM | POA: Diagnosis not present

## 2017-01-09 LAB — HM DIABETES EYE EXAM

## 2017-01-09 MED ORDER — OMEPRAZOLE 40 MG PO CPDR: 40.0000 mg | DELAYED_RELEASE_CAPSULE | Freq: Every day | ORAL | 1 refills | Status: DC

## 2017-01-09 NOTE — Assessment & Plan Note (Signed)
S:  starting last week has had several episodes of burning in upper abdomen as well as diarrhea (randomly and not always after meals). Burning in epigastric area Usually after meals or snacks- rates as up to 7/10.  Last endoscopy in 2010 due to bleeding ulcer. Denies ibuprofen/aleve/aspirin. Only takes tylenol. No blood in stool. No melena. Takes prilosec 20mg  after breakfast.  A/P: we will trial Omeprazole 40mg  once a day before breakfast with history of ulceration and primarily post meal symptoms with burning. Would consider cbc, cmp, lipase, [place] RUQ if not improved as first step then potentially CT abd/pelvis if needed.

## 2017-01-09 NOTE — Assessment & Plan Note (Signed)
S: Continued memory issues. Asking same question regarding activity of the day. Losing way ohome or to familiar places. Not remembering name of food. Cant remember directions to local places  From overview "MMSE 28/30 with 2 losses for 1/3 three word recall. Stable 06/16/15 and 09/28/16" A/P: MMSE has not worsened on prior checks but functionally worsening symptoms- we discussed referral to neurology and he agrees. Have done lab testing but have not checked neuroimaging- would request neurology opinion.

## 2017-01-09 NOTE — Progress Notes (Signed)
Subjective:  Eddie Ferguson is a 76 y.o. year old very pleasant male patient who presents for/with See problem oriented charting ROS- no shortness of breath or chest pain. No diaphoresis with episodes. No left arm or neck pain.    Past Medical History-  Patient Active Problem List   Diagnosis Date Noted  . Prostate cancer ([place]) 05/28/2014    Priority: High  . Diabetes mellitus type 2, controlled ([place]) 04/23/2014    Priority: High  . Gout 07/09/2015    Priority: Medium  . Short-term memory loss 06/16/2015    Priority: Medium  . Essential hypertension 04/23/2014    Priority: Medium  . Hyperlipidemia 04/23/2014    Priority: Medium  . Hypothyroidism 04/23/2014    Priority: Medium  . Stomach ulcer     Priority: Medium  . Adenomatous colon polyp 05/28/2014    Priority: Low  . Osteoarthritis 05/28/2014    Priority: Low  . Erectile dysfunction 05/28/2014    Priority: Low  . Basal cell carcinoma 05/28/2014    Priority: Low  . GERD (gastroesophageal reflux disease) 04/23/2014    Priority: Low  . Allergic rhinitis 04/23/2014    Priority: Low  . Glaucoma 04/23/2014    Priority: Low    Medications- reviewed and updated Current Outpatient Medications  Medication Sig Dispense Refill  . acetaminophen (TYLENOL) 500 MG tablet Take 500 mg by mouth every 6 (six) hours as needed. Reported on 06/16/2015    . allopurinol (ZYLOPRIM) 100 MG tablet Take 1 tablet (100 mg total) by mouth 2 (two) times daily. 180 tablet 1  . carvedilol (COREG) 25 MG tablet Take 0.5 tablets (12.5 mg total) by mouth 2 (two) times daily with a meal. 180 tablet 3  . chlorthalidone (HYGROTON) 25 MG tablet Take 0.5-1 tablets (12.5-25 mg total) by mouth daily. 90 tablet 2  . Cholecalciferol (VITAMIN D) 2000 UNITS CAPS Take by mouth daily.      . colchicine (COLCRYS) 0.6 MG tablet Take 2 tablets at first sign of gout, then once daily until attack resolves (7-10 days max) 30 tablet 2  . diltiazem (CARTIA XT) 180 MG 24 hr  capsule Take 1 capsule (180 mg total) by mouth 2 (two) times daily. 180 capsule 3  . fexofenadine (ALLEGRA) 180 MG tablet Take 180 mg by mouth.    . fluticasone (FLONASE) 50 MCG/ACT nasal spray Place 2 sprays into both nostrils daily. 48 g 3  . Glucosamine-Chondroit-Vit C-Mn (GLUCOSAMINE-CHONDROITIN) CAPS Take 1,500 mg by mouth.    Marland Kitchen glucose blood (FREESTYLE LITE) test strip Use to check blood sugars daily. Dx: E11.9 100 each 12  . latanoprost (XALATAN) 0.005 % ophthalmic solution Place 1 drop into both eyes at bedtime.     Marland Kitchen levothyroxine (SYNTHROID) 50 MCG tablet Take 1 tablet (50 mcg total) by mouth daily. 90 tablet 3  . methocarbamol (ROBAXIN) 750 MG tablet Take 1 tablet (750 mg total) by mouth every 8 (eight) hours as needed for muscle spasms. 60 tablet 3  . Multiple Vitamin (MULTIVITAMIN) tablet Take 1 tablet by mouth daily.      . Omega-3 Fatty Acids (FISH OIL) 1200 MG CAPS Take by mouth daily.      Marland Kitchen omeprazole (PRILOSEC) 40 MG capsule Take 1 capsule (40 mg total) daily by mouth. 30 capsule 1  . potassium chloride SA (KLOR-CON M20) 20 MEQ tablet Take 1 tablet (20 mEq total) by mouth daily. 90 tablet 2  . pravastatin (PRAVACHOL) 40 MG tablet Take 1 tablet (40 mg  total) by mouth daily. 90 tablet 3  . quinapril (ACCUPRIL) 40 MG tablet Take 1 tablet (40 mg total) by mouth daily. 90 tablet 3   No current facility-administered medications for this visit.     Objective: BP (!) 120/56 (BP Location: Left Arm, Patient Position: Sitting, Cuff Size: Large)   Pulse (!) 58   Temp 98.3 F (36.8 C) (Oral)   Ht 5\' 8"  (1.727 m)   Wt 214 lb 6.4 oz (97.3 kg)   SpO2 96%   BMI 32.60 kg/m  Gen: NAD, resting comfortably CV: RRR no murmurs rubs or gallops Lungs: CTAB no crackles, wheeze, rhonchi Abdomen: soft/nontender/nondistended/normal bowel sounds. No rebound or guarding.  Ext: no edema Skin: warm, dry Struggles with recall at times  Assessment/Plan:  GERD (gastroesophageal reflux  disease) S:  starting last week has had several episodes of burning in upper abdomen as well as diarrhea (randomly and not always after meals). Burning in epigastric area Usually after meals or snacks- rates as up to 7/10.  Last endoscopy in 2010 due to bleeding ulcer. Denies ibuprofen/aleve/aspirin. Only takes tylenol. No blood in stool. No melena. Takes prilosec 20mg  after breakfast.  A/P: we will trial Omeprazole 40mg  once a day before breakfast with history of ulceration and primarily post meal symptoms with burning. Would consider cbc, cmp, lipase, [place] RUQ if not improved as first step then potentially CT abd/pelvis if needed.   Short-term memory loss S: Continued memory issues. Asking same question regarding activity of the day. Losing way ohome or to familiar places. Not remembering name of food. Cant remember directions to local places  From overview "MMSE 28/30 with 2 losses for 1/3 three word recall. Stable 06/16/15 and 09/28/16" A/P: MMSE has not worsened on prior checks but functionally worsening symptoms- we discussed referral to neurology and he agrees. Have done lab testing but have not checked neuroimaging- would request neurology opinion.   Future Appointments  Date Time Provider [place]  01/11/2017  4:15 PM Gardiner Barefoot, DPM [facility]  04/04/2017  8:30 AM Yong Channel, Brayton Mars, MD [facility] None   Orders Placed This Encounter  Procedures  . Ambulatory referral to Neurology    Referral Priority:   Routine    Referral Type:   Consultation    Referral Reason:   Specialty Services Required    Requested Specialty:   Neurology    Number of Visits Requested:   1   Meds ordered this encounter  Medications  . omeprazole (PRILOSEC) 40 MG capsule    Sig: Take 1 capsule (40 mg total) daily by mouth.    Dispense:  30 capsule    Refill:  1   Return precautions advised.  Garret Reddish, MD

## 2017-01-09 NOTE — Patient Instructions (Addendum)
Take omeprazole 40mg  daily BEFORE breakfast. If you are not improving within 2-3 weeks or symptoms worsen return to see [place]. Take at least for a month . May take 2 months if improving but not completely resolved- after that time return to 20mg  daily  We will call you within a week or two about your referral to neurology. If you do not hear within 3 weeks, give [place] a call.  It may take [place] a month or two to get you in but with worsening memory want to evaluate further

## 2017-01-10 ENCOUNTER — Encounter: Payer: Self-pay | Admitting: Neurology

## 2017-01-11 ENCOUNTER — Encounter: Payer: Self-pay | Admitting: Podiatry

## 2017-01-11 ENCOUNTER — Ambulatory Visit (INDEPENDENT_AMBULATORY_CARE_PROVIDER_SITE_OTHER): Payer: Medicare HMO | Admitting: Podiatry

## 2017-01-11 DIAGNOSIS — M216X9 Other acquired deformities of unspecified foot: Secondary | ICD-10-CM

## 2017-01-11 DIAGNOSIS — Q828 Other specified congenital malformations of skin: Secondary | ICD-10-CM

## 2017-01-11 DIAGNOSIS — Z8546 Personal history of malignant neoplasm of prostate: Secondary | ICD-10-CM | POA: Diagnosis not present

## 2017-01-11 NOTE — Progress Notes (Signed)
This patient presents to the office for continued evaluation and treatment of painful calluses on the bottom of both feet.  He was seen in the office initially treated  With debridement  and dispersion padding.  He presents the office today saying that the callus has returned and is causing pain and discomfort walking and wearing his shoes.       Podiatric Exam: Vascular: dorsalis pedis and posterior tibial pulses are palpable bilateral. Capillary return is immediate. Temperature gradient is WNL. Skin turgor WNL  Sensorium: Normal Semmes Weinstein monofilament test. Normal tactile sensation bilaterally. Nail Exam: normotropic  nails with no evidence of bacterial or fungal infection Ulcer Exam: There is no evidence of ulcer or pre-ulcerative changes or infection. Orthopedic Exam: Muscle tone and strength are WNL. No limitations in general ROM. No crepitus or effusions noted. Foot type and digits show no abnormalities. Bony prominences are unremarkable. Midfoot and rearfoot arthritis right foot. 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 No infection or ulcer.  Porokeratosis  B/L  Debridement of porokeratosis  Padding applied to his powerstep insoles.  RTC prn.  Gardiner Barefoot DPM

## 2017-01-25 ENCOUNTER — Encounter: Payer: Self-pay | Admitting: Family Medicine

## 2017-03-01 ENCOUNTER — Other Ambulatory Visit: Payer: Self-pay

## 2017-03-01 MED ORDER — OMEPRAZOLE 40 MG PO CPDR: 40.0000 mg | DELAYED_RELEASE_CAPSULE | Freq: Every day | ORAL | 2 refills | Status: DC

## 2017-03-14 ENCOUNTER — Other Ambulatory Visit: Payer: Self-pay

## 2017-03-14 MED ORDER — PRAVASTATIN SODIUM 40 MG PO TABS: 40.0000 mg | ORAL_TABLET | Freq: Every day | ORAL | 3 refills | Status: DC

## 2017-03-14 MED ORDER — QUINAPRIL HCL 40 MG PO TABS: 40.0000 mg | ORAL_TABLET | Freq: Every day | ORAL | 3 refills | Status: DC

## 2017-03-27 ENCOUNTER — Encounter: Payer: Self-pay | Admitting: Neurology

## 2017-03-27 ENCOUNTER — Ambulatory Visit (INDEPENDENT_AMBULATORY_CARE_PROVIDER_SITE_OTHER): Payer: Medicare HMO | Admitting: Neurology

## 2017-03-27 VITALS — BP 158/84 | HR 76 | Ht 69.0 in | Wt 216.0 lb

## 2017-03-27 DIAGNOSIS — R413 Other amnesia: Secondary | ICD-10-CM | POA: Diagnosis not present

## 2017-03-27 DIAGNOSIS — G3184 Mild cognitive impairment, so stated: Secondary | ICD-10-CM | POA: Diagnosis not present

## 2017-03-27 MED ORDER — DONEPEZIL HCL 10 MG PO TABS: ORAL_TABLET | ORAL | 11 refills | Status: DC

## 2017-03-27 NOTE — Progress Notes (Signed)
NEUROLOGY CONSULTATION NOTE  [name]. Hausen MRN: [id number] DOB: 1940-10-01  Referring provider: Dr. Garret Reddish Primary care provider: Dr. Garret Reddish  Reason for consult:  Short-term memory changes  Dear Dr Yong Channel:  Thank you for your kind referral of Preet Perrier. Terral for consultation of the above symptoms. Although his history is well known to you, please allow me to reiterate it for the purpose of our medical record. The patient was accompanied to the clinic by his wife who also provides collateral information. Records and images were personally reviewed where available.   HISTORY OF PRESENT ILLNESS: This is a very pleasant 77 year old right-handed man with a history of hypertension, hyperlipidemia, diabetes, hypothyroidism, presenting for evaluation of short-term memory loss. He and his wife report changes started around a year ago. He would not be able to recall where he ate the day prior. His wife reports he repeats himself and asks the same questions several times. He used to be accurate and precise, and has a very detailed spreadsheet of his medications (has similar one for bills), but now has difficulty using his computer. He is better in the morning, but later in the afternoon, he cannot recall how he set up a worksheet or where it is in his files. He makes wrong turns while driving, but is able to get back on track easily. He occasionally misplaces things at home. His wife denies any personality changes, but he gets frustrated with himself because he is usually so precise. He has problems with multitasking. No paranoia or hallucinations. He is independent with ADLs. MMSE at his PCP office in November 2018 was 28/30. 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 His paternal grandmother had dementia. No history of significant head injuries. He drinks beer on occasion.   He has numbness in both feet. He has a  left knee brace from a torn tendon. He denies any headaches, dizziness, diplopia, dysarthria/dypshagia, neck/back pain, focal numbness/tingling/weakness, bowel/bladder dysfunction, anosmia, or tremors.   Laboratory Data: Lab Results  Component Value Date   TSH 3.06 09/28/2016   Lab Results  Component Value Date   [id number] 09/28/2016     PAST MEDICAL HISTORY: Past Medical History:  Diagnosis Date  . Allergy   . Ankle injury 06/01/2001   no surgery- skin debridement   . Arthritis   . Basal cell carcinoma 05/28/2014   L nasal tip. Mohs Dr. Link Snuffer.    . Blood transfusion    2010 because of stomach ulcers  . Cataract    bilaterally removed  . Diabetes mellitus   . GERD (gastroesophageal reflux disease)   . Glaucoma   . Hyperlipidemia   . Hypertension   . Hypothyroidism   . Pneumonia 04/12/2006  . Prostate cancer ([place])   . Seasonal allergies   . Sinusitis    treated for bacterial infection at least once a year at [facility]  . Stomach ulcer 2010   bleeding ulcer result NSAIDS    PAST SURGICAL HISTORY: Past Surgical History:  Procedure Laterality Date  . abdominl abcess     03-15-2011  . BACK SURGERY    . CATARACT EXTRACTION, BILATERAL     04-07-2014, 03-17-2014  . COLONOSCOPY     2012, 5 year repeat  . CYSTOURETHROSCOPY  11/11/1998  . ESOPHAGOGASTRODUODENOSCOPY  2010   [facility]  . LUMBAR LAMINECTOMY  2007   [place]  . MOHS SURGERY  left side of nose  . POLYPECTOMY    . PROSTATE BIOPSY     radiation 2016  . RECONSTRUCTION TENDON PULLEY W/ TENDON / FASCIAL GRAFT OF HAND / FINGER  1982   rt 3rd finger  . RECONSTRUCTION TENDON PULLEY W/ TENDON / FASCIAL GRAFT OF HAND / FINGER  1953   rt 5th finger  . UPPER GASTROINTESTINAL ENDOSCOPY      MEDICATIONS: Current Outpatient Medications on File Prior to Visit  Medication Sig Dispense Refill  . acetaminophen (TYLENOL) 500 MG tablet Take 500 mg by mouth every 6 (six) hours as needed. Reported on 06/16/2015    .  allopurinol (ZYLOPRIM) 100 MG tablet Take 1 tablet (100 mg total) by mouth 2 (two) times daily. 180 tablet 1  . carvedilol (COREG) 25 MG tablet Take 0.5 tablets (12.5 mg total) by mouth 2 (two) times daily with a meal. 180 tablet 3  . chlorthalidone (HYGROTON) 25 MG tablet Take 0.5-1 tablets (12.5-25 mg total) by mouth daily. 90 tablet 2  . Cholecalciferol (VITAMIN D) 2000 UNITS CAPS Take by mouth daily.      . colchicine (COLCRYS) 0.6 MG tablet Take 2 tablets at first sign of gout, then once daily until attack resolves (7-10 days max) 30 tablet 2  . diltiazem (CARTIA XT) 180 MG 24 hr capsule Take 1 capsule (180 mg total) by mouth 2 (two) times daily. 180 capsule 3  . fexofenadine (ALLEGRA) 180 MG tablet Take 180 mg by mouth.    . fluticasone (FLONASE) 50 MCG/ACT nasal spray Place 2 sprays into both nostrils daily. 48 g 3  . Glucosamine-Chondroit-Vit C-Mn (GLUCOSAMINE-CHONDROITIN) CAPS Take 1,500 mg by mouth.    Marland Kitchen glucose blood (FREESTYLE LITE) test strip Use to check blood sugars daily. Dx: E11.9 100 each 12  . latanoprost (XALATAN) 0.005 % ophthalmic solution Place 1 drop into both eyes at bedtime.     Marland Kitchen levothyroxine (SYNTHROID) 50 MCG tablet Take 1 tablet (50 mcg total) by mouth daily. 90 tablet 3  . methocarbamol (ROBAXIN) 750 MG tablet Take 1 tablet (750 mg total) by mouth every 8 (eight) hours as needed for muscle spasms. 60 tablet 3  . Multiple Vitamin (MULTIVITAMIN) tablet Take 1 tablet by mouth daily.      . Omega-3 Fatty Acids (FISH OIL) 1200 MG CAPS Take by mouth daily.      Marland Kitchen omeprazole (PRILOSEC) 40 MG capsule Take 1 capsule (40 mg total) by mouth daily. 30 capsule 2  . potassium chloride SA (KLOR-CON M20) 20 MEQ tablet Take 1 tablet (20 mEq total) by mouth daily. 90 tablet 2  . pravastatin (PRAVACHOL) 40 MG tablet Take 1 tablet (40 mg total) by mouth daily. 90 tablet 3  . quinapril (ACCUPRIL) 40 MG tablet Take 1 tablet (40 mg total) by mouth daily. 90 tablet 3   No current  facility-administered medications on file prior to visit.     ALLERGIES: Allergies  Allergen Reactions  . Aspirin Other (See Comments)    Stomach ulcers  . Nsaids Other (See Comments)    Stomach ulcers  . Amoxicillin-Pot Clavulanate Rash  . Sulfa Antibiotics Swelling and Rash    FAMILY HISTORY: Family History  Problem Relation Age of Onset  . Diabetes Mother   . Diabetes Father   . Hyperlipidemia Father   . Colon polyps Father   . Brain cancer Brother        smoker  . Colon cancer Neg Hx   . Esophageal cancer Neg Hx   .  Stomach cancer Neg Hx   . Rectal cancer Neg Hx     SOCIAL HISTORY: Social History   Socioeconomic History  . Marital status: Married    Spouse name: Not on file  . Number of children: Not on file  . Years of education: Not on file  . Highest education level: Not on file  Social Needs  . Financial resource strain: Not on file  . Food insecurity - worry: Not on file  . Food insecurity - inability: Not on file  . Transportation needs - medical: Not on file  . Transportation needs - non-medical: Not on file  Occupational History  . Not on file  Tobacco Use  . Smoking status: Former Smoker    Last attempt to quit: 02/03/2001    Years since quitting: 16.1  . Smokeless tobacco: Never Used  . Tobacco comment: intermittent cigar use  Substance and Sexual Activity  . Alcohol use: Yes    Alcohol/week: 0.0 oz    Comment: occasional  . Drug use: No  . Sexual activity: Not on file  Other Topics Concern  . Not on file  Social History Narrative   Married (wife patient outside practice). 2 children. 5 grandkids.       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       Hobbies: travel, enjoys going to shows, [employer] women's basketball tournament. Doesn't watch tv.     REVIEW OF SYSTEMS: Constitutional: No fevers, chills, or sweats, no generalized fatigue, change in appetite Eyes: No visual changes, double vision, eye pain Ear, nose and throat: No hearing  loss, ear pain, nasal congestion, sore throat Cardiovascular: No chest pain, palpitations Respiratory:  No shortness of breath at rest or with exertion, wheezes GastrointestinaI: No nausea, vomiting, diarrhea, abdominal pain, fecal incontinence Genitourinary:  No dysuria, urinary retention or frequency Musculoskeletal:  No neck pain, back pain Integumentary: No rash, pruritus, skin lesions Neurological: as above Psychiatric: No depression, insomnia, anxiety Endocrine: No palpitations, fatigue, diaphoresis, mood swings, change in appetite, change in weight, increased thirst Hematologic/Lymphatic:  No anemia, purpura, petechiae. Allergic/Immunologic: no itchy/runny eyes, nasal congestion, recent allergic reactions, rashes  PHYSICAL EXAM: Vitals:   03/27/17 0853  BP: (!) 158/84  Pulse: 76  SpO2: 93%   General: No acute distress Head:  Normocephalic/atraumatic Eyes: Fundoscopic exam shows bilateral sharp discs, no vessel changes, exudates, or hemorrhages Neck: supple, no paraspinal tenderness, full range of motion Back: No paraspinal tenderness Heart: regular rate and rhythm Lungs: Clear to auscultation bilaterally. Vascular: No carotid bruits. Skin/Extremities: No rash, no edema, left knee brace Neurological Exam: Mental status: alert and oriented to person, place, and time, no dysarthria or aphasia, Fund of knowledge is appropriate.  Recent and remote memory are intact.  Attention and concentration are normal.    Able to name objects and repeat phrases.  Montreal Cognitive Assessment  03/27/2017  Visuospatial/ Executive (0/5) 4  Naming (0/3) 3  Attention: Read list of digits (0/2) 2  Attention: Read list of letters (0/1) 1  Attention: Serial 7 subtraction starting at 100 (0/3) 3  Language: Repeat phrase (0/2) 2  Language : Fluency (0/1) 1  Abstraction (0/2) 2  Delayed Recall (0/5) 0  Orientation (0/6) 6  Total 24   Cranial nerves: CN I: not tested CN II: pupils equal, round  and reactive to light, visual fields intact, fundi unremarkable. CN III, IV, VI:  full range of motion, no nystagmus, no ptosis CN V: facial sensation intact CN VII: upper and  lower face symmetric CN VIII: hearing intact to finger rub CN IX, X: gag intact, uvula midline CN XI: sternocleidomastoid and trapezius muscles intact CN XII: tongue midline Bulk & Tone: normal, no fasciculations. Motor: 5/5 throughout with no pronator drift. Sensation: intact to light touch, cold, pin, vibration and joint position sense.  No extinction to double simultaneous stimulation.  Romberg test negative Deep Tendon Reflexes: +1 throughout except for absent ankle jerks bilaterally, no ankle clonus Plantar responses: downgoing bilaterally Cerebellar: no incoordination on finger to nose testing Gait: narrow-based and steady, mild difficulty with tandem walk Tremor: none  IMPRESSION: This is a very pleasant 77 year old right-handed man with vascular risk factors including hypertension, hyperlipidemia, diabetes, presenting for worsening short-term memory loss. MOCA score today 24/30, indicating mild cognitive impairment, however they report difficulties with complex tasks such as using his computer and some driving concerns (wrong turns), suggestive of mild dementia. MRI brain without contrast will be ordered to assess for underlying structural abnormality and assess vascular load. We discussed starting cholinesterase inhibitors such as Aricept, we discussed side effects and expectations from the medication. Start Aricept 5mg  daily for a month, then increase to 10mg  daily. Continue to monitor driving, he may benefit from a DMV driving evaluation if driving concerns worsen. We also discussed the importance of control of vascular risk factors, physical exercise, and brain stimulation exercises for brain health. He will follow-up in 6 months and knows to call for any changes.   Thank you for allowing me to participate in  the care of this patient. Please do not hesitate to call for any questions or concerns.   Ellouise Newer, M.D.  CC: Dr. Yong Channel

## 2017-03-27 NOTE — Patient Instructions (Addendum)
1. Schedule MRI brain without contrast  We have sent a referral to [facility] for your MRI and they will call you directly to schedule your appt. They are located at [place]. If you need to contact them directly please call [phone].   2. Start Aricept 10mg : Take 1/2 tablet daily for a month, then increase to 1 tablet daily 3. Continue to monitor driving, if starting to have more difficulties, would stop driving 4. Follow-up in 6 months, call for any changes  FALL PRECAUTIONS: Be cautious when walking. Scan the area for obstacles that may increase the risk of trips and falls. When getting up in the mornings, sit up at the edge of the bed for a few minutes before getting out of bed. Consider elevating the bed at the head end to avoid drop of blood pressure when getting up. Walk always in a well-lit room (use night lights in the walls). Avoid area rugs or power cords from appliances in the middle of the walkways. Use a walker or a cane if necessary and consider physical therapy for balance exercise. Get your eyesight checked regularly.  FINANCIAL OVERSIGHT: Supervision, especially oversight when making financial decisions or transactions is also recommended.  HOME SAFETY: Consider the safety of the kitchen when operating appliances like stoves, microwave oven, and blender. Consider having supervision and share cooking responsibilities until no longer able to participate in those. Accidents with firearms and other hazards in the house should be identified and addressed as well.  DRIVING: Regarding driving, in patients with progressive memory problems, driving will be impaired. We advise to have someone else do the driving if trouble finding directions or if minor accidents are reported. Independent driving assessment is available to determine safety of driving.  ABILITY TO BE LEFT ALONE: If patient is unable to contact 911 operator, consider using LifeLine, or when the need is  there, arrange for someone to stay with patients. Smoking is a fire hazard, consider supervision or cessation. Risk of wandering should be assessed by caregiver and if detected at any point, supervision and safe proof recommendations should be instituted.  MEDICATION SUPERVISION: Inability to self-administer medication needs to be constantly addressed. Implement a mechanism to ensure safe administration of the medications.  RECOMMENDATIONS FOR ALL PATIENTS WITH MEMORY PROBLEMS: 1. Continue to exercise (Recommend 30 minutes of walking everyday, or 3 hours every week) 2. Increase social interactions - continue going to [place] and enjoy social gatherings with friends and family 3. Eat healthy, avoid fried foods and eat more fruits and vegetables 4. Maintain adequate blood pressure, blood sugar, and blood cholesterol level. Reducing the risk of stroke and cardiovascular disease also helps promoting better memory. 5. Avoid stressful situations. Live a simple life and avoid aggravations. Organize your time and prepare for the next day in anticipation. 6. Sleep well, avoid any interruptions of sleep and avoid any distractions in the bedroom that may interfere with adequate sleep quality 7. Avoid sugar, avoid sweets as there is a strong link between excessive sugar intake, diabetes, and cognitive impairment We discussed the Mediterranean diet, which has been shown to help patients reduce the risk of progressive memory disorders and reduces cardiovascular risk. This includes eating fish, eat fruits and green leafy vegetables, nuts like almonds and hazelnuts, walnuts, and also use olive oil. Avoid fast foods and fried foods as much as possible. Avoid sweets and sugar as sugar use has been linked to worsening of memory function.  There is always a concern  of gradual progression of memory problems. If this is the case, then we may need to adjust level of care according to patient needs. Support, both to the  patient and caregiver, should then be put into place.

## 2017-04-04 ENCOUNTER — Ambulatory Visit: Payer: Medicare HMO | Admitting: Family Medicine

## 2017-04-07 ENCOUNTER — Other Ambulatory Visit: Payer: Self-pay

## 2017-04-07 MED ORDER — DILTIAZEM HCL ER COATED BEADS 180 MG PO CP24: 180.0000 mg | ORAL_CAPSULE | Freq: Two times a day (BID) | ORAL | 3 refills | Status: DC

## 2017-04-17 ENCOUNTER — Ambulatory Visit
Admission: RE | Admit: 2017-04-17 | Discharge: 2017-04-17 | Disposition: A | Discharge status: Home / Self Care | Payer: Medicare HMO | Source: Ambulatory Visit | Attending: Neurology | Admitting: Neurology

## 2017-04-17 DIAGNOSIS — R413 Other amnesia: Secondary | ICD-10-CM

## 2017-04-18 ENCOUNTER — Other Ambulatory Visit: Payer: Self-pay

## 2017-04-18 ENCOUNTER — Ambulatory Visit (INDEPENDENT_AMBULATORY_CARE_PROVIDER_SITE_OTHER): Payer: Medicare HMO | Admitting: Family Medicine

## 2017-04-18 ENCOUNTER — Encounter: Payer: Self-pay | Admitting: Family Medicine

## 2017-04-18 ENCOUNTER — Telehealth: Payer: Self-pay

## 2017-04-18 VITALS — BP 140/70 | HR 58 | Temp 98.2°F | Ht 69.0 in | Wt 212.0 lb

## 2017-04-18 DIAGNOSIS — I10 Essential (primary) hypertension: Secondary | ICD-10-CM

## 2017-04-18 DIAGNOSIS — G3184 Mild cognitive impairment, so stated: Secondary | ICD-10-CM | POA: Diagnosis not present

## 2017-04-18 DIAGNOSIS — E119 Type 2 diabetes mellitus without complications: Secondary | ICD-10-CM

## 2017-04-18 DIAGNOSIS — N183 Chronic kidney disease, stage 3 unspecified: Secondary | ICD-10-CM | POA: Insufficient documentation

## 2017-04-18 DIAGNOSIS — K219 Gastro-esophageal reflux disease without esophagitis: Secondary | ICD-10-CM | POA: Diagnosis not present

## 2017-04-18 DIAGNOSIS — E039 Hypothyroidism, unspecified: Secondary | ICD-10-CM

## 2017-04-18 DIAGNOSIS — E876 Hypokalemia: Secondary | ICD-10-CM

## 2017-04-18 LAB — HEMOGLOBIN A1C: HEMOGLOBIN A1C: 6.7 % — AB (ref 4.6–6.5)

## 2017-04-18 LAB — BASIC METABOLIC PANEL
BUN: 17 mg/dL (ref 6–23)
CHLORIDE: 98 meq/L (ref 96–112)
CO2: 32 mEq/L (ref 19–32)
Calcium: 9.3 mg/dL (ref 8.4–10.5)
Creatinine, Ser: 1.32 mg/dL (ref 0.40–1.50)
GFR: 55.62 mL/min — ABNORMAL LOW (ref >60.00)
Glucose, Bld: 143 mg/dL — ABNORMAL HIGH (ref 70–99)
POTASSIUM: 2.8 meq/L — AB (ref 3.5–5.1)
SODIUM: 140 meq/L (ref 135–145)

## 2017-04-18 LAB — TSH: TSH: 2.65 u[IU]/mL (ref 0.35–4.50)

## 2017-04-18 NOTE — Assessment & Plan Note (Addendum)
S: burning epigastric pain resolved on omeprazole 40mg  A/P: we will trial otc 20mg  omeprazole again- will not change med list yet- unless he is able to tolerate long term the 20mg  dose. We discussed could get another GI evaluation/possible EGD if not able to get back to 20mg  dose. May consider b12 recheck with future labs if on long term ppi

## 2017-04-18 NOTE — Telephone Encounter (Signed)
Spoke with patient who verbalized understanding. I scheduled him for a lab appointment on Thursday and entered in the order

## 2017-04-18 NOTE — Assessment & Plan Note (Signed)
S:  Diet Controlled in past- may be worsened with higher CBGS CBGs- averaging 139 in the morning- had been around 110 or 120 previously Exercise and diet-  admits to not exercising, small portion sizes- weight actually slightly down.  Lab Results  Component Value Date   HGBA1C 6.7 (H) 04/18/2017   HGBA1C 6.6 (H) 09/28/2016   HGBA1C 6.2 03/18/2016   A/P:  Doesn't tolerate metformin for fatigue and loose stools. If a1c above 7 considre going back on low dose actos which he tolerated well. Roselyn Reef is getting a copy of diabetic eye exam.  Luckily a1c still at goal under 7

## 2017-04-18 NOTE — Assessment & Plan Note (Signed)
MMSE 24/30 with neurology. Started on aricept. He is tolerating medication without issues

## 2017-04-18 NOTE — Progress Notes (Signed)
Subjective:  Eddie Coin. Desa is a 77 y.o. year old very pleasant male patient who presents for/with See problem oriented charting ROS- wife with him and states has continued to have memory issues. No chest pain or shortness of breath. No fever or chills. No hypoglycemia.    Past Medical History-  Patient Active Problem List   Diagnosis Date Noted  . Mild cognitive impairment 06/16/2015    Priority: High  . Prostate cancer ([place]) 05/28/2014    Priority: High  . Diabetes mellitus type 2, controlled ([place]) 04/23/2014    Priority: High  . Gout 07/09/2015    Priority: Medium  . Essential hypertension 04/23/2014    Priority: Medium  . Hyperlipidemia 04/23/2014    Priority: Medium  . Hypothyroidism 04/23/2014    Priority: Medium  . Stomach ulcer     Priority: Medium  . Adenomatous colon polyp 05/28/2014    Priority: Low  . Osteoarthritis 05/28/2014    Priority: Low  . Erectile dysfunction 05/28/2014    Priority: Low  . Basal cell carcinoma 05/28/2014    Priority: Low  . GERD (gastroesophageal reflux disease) 04/23/2014    Priority: Low  . Allergic rhinitis 04/23/2014    Priority: Low  . Glaucoma 04/23/2014    Priority: Low  . CKD (chronic kidney disease), stage III ([place]) 04/18/2017    Medications- reviewed and updated Current Outpatient Medications  Medication Sig Dispense Refill  . acetaminophen (TYLENOL) 500 MG tablet Take 500 mg by mouth every 6 (six) hours as needed. Reported on 06/16/2015    . allopurinol (ZYLOPRIM) 100 MG tablet Take 1 tablet (100 mg total) by mouth 2 (two) times daily. 180 tablet 1  . carvedilol (COREG) 25 MG tablet Take 0.5 tablets (12.5 mg total) by mouth 2 (two) times daily with a meal. 180 tablet 3  . chlorthalidone (HYGROTON) 25 MG tablet Take 0.5-1 tablets (12.5-25 mg total) by mouth daily. 90 tablet 2  . Cholecalciferol (VITAMIN D) 2000 UNITS CAPS Take by mouth daily.      Marland Kitchen diltiazem (CARTIA XT) 180 MG 24 hr capsule Take 1 capsule (180 mg total)  by mouth 2 (two) times daily. 180 capsule 3  . donepezil (ARICEPT) 10 MG tablet Take 1/2 tablet daily for a month, then increase to 1 tablet daily and continue 30 tablet 11  . fexofenadine (ALLEGRA) 180 MG tablet Take 180 mg by mouth.    . fluticasone (FLONASE) 50 MCG/ACT nasal spray Place 2 sprays into both nostrils daily. 48 g 3  . Glucosamine-Chondroit-Vit C-Mn (GLUCOSAMINE-CHONDROITIN) CAPS Take 1,500 mg by mouth.    Marland Kitchen glucose blood (FREESTYLE LITE) test strip Use to check blood sugars daily. Dx: E11.9 100 each 12  . latanoprost (XALATAN) 0.005 % ophthalmic solution Place 1 drop into both eyes at bedtime.     Marland Kitchen levothyroxine (SYNTHROID) 50 MCG tablet Take 1 tablet (50 mcg total) by mouth daily. 90 tablet 3  . methocarbamol (ROBAXIN) 750 MG tablet Take 1 tablet (750 mg total) by mouth every 8 (eight) hours as needed for muscle spasms. 60 tablet 3  . Multiple Vitamin (MULTIVITAMIN) tablet Take 1 tablet by mouth daily.      . Omega-3 Fatty Acids (FISH OIL) 1200 MG CAPS Take by mouth daily.      . potassium chloride SA (KLOR-CON M20) 20 MEQ tablet Take 1 tablet (20 mEq total) by mouth daily. 90 tablet 2  . pravastatin (PRAVACHOL) 40 MG tablet Take 1 tablet (40 mg total) by mouth daily.  90 tablet 3  . quinapril (ACCUPRIL) 40 MG tablet Take 1 tablet (40 mg total) by mouth daily. 90 tablet 3  . omeprazole (PRILOSEC) 40 MG capsule Take 1 capsule (40 mg total) by mouth daily. (Patient not taking: Reported on 04/18/2017) 30 capsule 2   No current facility-administered medications for this visit.     Objective: BP 140/70 (BP Location: Left Arm, Patient Position: Sitting, Cuff Size: Large)   Pulse (!) 58   Temp 98.2 F (36.8 C) (Oral)   Ht 5\' 9"  (1.753 m)   Wt 212 lb (96.2 kg)   SpO2 95%   BMI 31.31 kg/m  Gen: NAD, resting comfortably CV: RRR  Not bradycardic on my exam as was in vitals no murmurs rubs or gallops Lungs: CTAB no crackles, wheeze, rhonchi Abdomen:  soft/nontender/nondistended/normal bowel sounds. Ext: trace edema Skin: warm, dry Able to recall but seems delayed compared to prior  Assessment/Plan:  Other issues: 1. Still having some loose stools. Reassuring colonoscopy 04/2016.  2. left eyelid lesion- sees dermatology next month. No issues. Was seen by optometrist who sent him to optho it sounds- but he states it wasn't evaluated.  3. Notices improvement in joint pain with CBD oil- oral drops.  4. Last few GFRs in high 50s- may need to add CKD III  GERD (gastroesophageal reflux disease) S: burning epigastric pain resolved on omeprazole 40mg  A/P: we will trial otc 20mg  omeprazole again- will not change med list yet- unless he is able to tolerate long term the 20mg  dose. We discussed could get another GI evaluation/possible EGD if not able to get back to 20mg  dose. May consider b12 recheck with future labs if on long term ppi     Diabetes mellitus type 2, controlled ([place]) S:  Diet Controlled in past- may be worsened with higher CBGS CBGs- averaging 139 in the morning- had been around 110 or 120 previously Exercise and diet-  admits to not exercising, small portion sizes- weight actually slightly down.  Lab Results  Component Value Date   HGBA1C 6.7 (H) 04/18/2017   HGBA1C 6.6 (H) 09/28/2016   HGBA1C 6.2 03/18/2016   A/P:  Doesn't tolerate metformin for fatigue and loose stools. If a1c above 7 considre going back on low dose actos which he tolerated well. Roselyn Reef is getting a copy of diabetic eye exam.  Luckily a1c still at goal under 7  Hypothyroidism S: Lab Results  Component Value Date   TSH 2.65 04/18/2017   On thyroid medication-levothyroxine 50 mcg.  ROS-some loose stools A/P: updated TSh normal- contiue current medications  Essential hypertension S: controlled on home readings. BPs at home - averaging 138/64. Will not adjust meds as long as <140/90- slightly high in office today  BP Readings from Last 3 Encounters:   04/18/17 140/70  03/27/17 (!) 158/84  01/09/17 (!) 120/56  A/P: We discussed blood pressure goal of <140/90. Continue current meds:  Hair high in office but with home #s at goal will not make changes  CKD (chronic kidney disease), stage III ([place]) S: 3 consecutive GFR in high 50s through 04/2017 A/P: we discussed potential diagnosis at visit but confirmed after visit. I have encouraged him to avoid nsaids, control BP - doing well, control diabetes, control lipids. Continue to monitor q6-12 months at least   Mild cognitive impairment MMSE 24/30 with neurology. Started on aricept. He is tolerating medication without issues  Future Appointments  Date Time Provider [facility]  04/20/2017  9:15 AM [facility]  [facility]  09/26/2017 10:00 AM Cameron Sprang, MD [facility] None   Return in about 6 months (around 10/16/2017) for follow up- or sooner if needed.  Lab/Order associations: Controlled type 2 diabetes mellitus without complication, without long-term current use of insulin ([place]) - Plan: Hemoglobin R6V, Basic metabolic panel  Hypothyroidism, unspecified type - Plan: TSH   Return precautions advised.  Garret Reddish, MD

## 2017-04-18 NOTE — Assessment & Plan Note (Signed)
S: Lab Results  Component Value Date   TSH 2.65 04/18/2017   On thyroid medication-levothyroxine 50 mcg.  ROS-some loose stools A/P: updated TSh normal- contiue current medications

## 2017-04-18 NOTE — Assessment & Plan Note (Signed)
S: controlled on home readings. BPs at home - averaging 138/64. Will not adjust meds as long as <140/90- slightly high in office today  BP Readings from Last 3 Encounters:  04/18/17 140/70  03/27/17 (!) 158/84  01/09/17 (!) 120/56  A/P: We discussed blood pressure goal of <140/90. Continue current meds:  Hair high in office but with home #s at goal will not make changes

## 2017-04-18 NOTE — Patient Instructions (Addendum)
we will trial otc 20mg  omeprazole again- will not change med list yet- unless he is able to tolerate long term the 20mg  dose. Could consider GI appointment if symptoms worsen again for possible endoscopy  No other changes today  If a1c is above 7 we will need to see each other in 14 weeks to 4 months  Please stop by lab before you go

## 2017-04-18 NOTE — Assessment & Plan Note (Signed)
S: 3 consecutive GFR in high 50s through 04/2017 A/P: we discussed potential diagnosis at visit but confirmed after visit. I have encouraged him to avoid nsaids, control BP - doing well, control diabetes, control lipids. Continue to monitor q6-12 months at least

## 2017-04-18 NOTE — Telephone Encounter (Signed)
Has patient been taking his potassium once a day as prescribed?  If already taking Have him take this twice a day today and twice a day tomorrow with recheck on thursday

## 2017-04-18 NOTE — Telephone Encounter (Signed)
Received a phone call from [facility] at [facility] stating patient's Potassium level is 2.8. Dr. Yong Channel notified. Awaiting orders

## 2017-04-20 ENCOUNTER — Other Ambulatory Visit (INDEPENDENT_AMBULATORY_CARE_PROVIDER_SITE_OTHER): Payer: Medicare HMO

## 2017-04-20 DIAGNOSIS — E876 Hypokalemia: Secondary | ICD-10-CM

## 2017-04-20 LAB — BASIC METABOLIC PANEL
BUN: 15 mg/dL (ref 6–23)
CALCIUM: 9.2 mg/dL (ref 8.4–10.5)
CO2: 35 meq/L — AB (ref 19–32)
Chloride: 95 mEq/L — ABNORMAL LOW (ref 96–112)
Creatinine, Ser: 1.44 mg/dL (ref 0.40–1.50)
GFR: 50.31 mL/min — ABNORMAL LOW (ref >60.00)
GLUCOSE: 226 mg/dL — AB (ref 70–99)
POTASSIUM: 3.1 meq/L — AB (ref 3.5–5.1)
SODIUM: 139 meq/L (ref 135–145)

## 2017-04-21 ENCOUNTER — Other Ambulatory Visit: Payer: Self-pay

## 2017-04-21 ENCOUNTER — Encounter: Payer: Self-pay | Admitting: Family Medicine

## 2017-04-21 DIAGNOSIS — E876 Hypokalemia: Secondary | ICD-10-CM

## 2017-04-24 ENCOUNTER — Encounter: Payer: Self-pay | Admitting: Family Medicine

## 2017-04-25 ENCOUNTER — Other Ambulatory Visit (INDEPENDENT_AMBULATORY_CARE_PROVIDER_SITE_OTHER): Payer: Medicare HMO

## 2017-04-25 ENCOUNTER — Other Ambulatory Visit: Payer: Self-pay

## 2017-04-25 DIAGNOSIS — E876 Hypokalemia: Secondary | ICD-10-CM

## 2017-04-25 LAB — BASIC METABOLIC PANEL
BUN: 19 mg/dL (ref 6–23)
CHLORIDE: 97 meq/L (ref 96–112)
CO2: 35 mEq/L — ABNORMAL HIGH (ref 19–32)
CREATININE: 1.47 mg/dL (ref 0.40–1.50)
Calcium: 9.4 mg/dL (ref 8.4–10.5)
GFR: 49.12 mL/min — AB (ref >60.00)
Glucose, Bld: 158 mg/dL — ABNORMAL HIGH (ref 70–99)
Potassium: 3.4 mEq/L — ABNORMAL LOW (ref 3.5–5.1)
Sodium: 139 mEq/L (ref 135–145)

## 2017-04-25 MED ORDER — LEVOTHYROXINE SODIUM 50 MCG PO TABS: 50.0000 ug | ORAL_TABLET | Freq: Every day | ORAL | 3 refills | Status: DC

## 2017-04-25 MED ORDER — POTASSIUM CHLORIDE CRYS ER 20 MEQ PO TBCR: 20.0000 meq | EXTENDED_RELEASE_TABLET | Freq: Every day | ORAL | 2 refills | Status: DC

## 2017-04-26 ENCOUNTER — Other Ambulatory Visit: Payer: Self-pay | Admitting: *Deleted

## 2017-04-26 ENCOUNTER — Other Ambulatory Visit: Payer: Self-pay | Admitting: Family Medicine

## 2017-04-26 DIAGNOSIS — E876 Hypokalemia: Secondary | ICD-10-CM

## 2017-04-26 MED ORDER — POTASSIUM CHLORIDE CRYS ER 20 MEQ PO TBCR: EXTENDED_RELEASE_TABLET | ORAL | 1 refills | Status: DC

## 2017-04-26 MED ORDER — LEVOTHYROXINE SODIUM 50 MCG PO TABS: 50.0000 ug | ORAL_TABLET | Freq: Every day | ORAL | 3 refills | Status: DC

## 2017-05-15 ENCOUNTER — Other Ambulatory Visit (INDEPENDENT_AMBULATORY_CARE_PROVIDER_SITE_OTHER): Payer: Medicare HMO

## 2017-05-15 DIAGNOSIS — E876 Hypokalemia: Secondary | ICD-10-CM | POA: Diagnosis not present

## 2017-05-15 LAB — BASIC METABOLIC PANEL
BUN: 14 mg/dL (ref 6–23)
CHLORIDE: 99 meq/L (ref 96–112)
CO2: 34 meq/L — AB (ref 19–32)
Calcium: 9.6 mg/dL (ref 8.4–10.5)
Creatinine, Ser: 1.49 mg/dL (ref 0.40–1.50)
GFR: 48.36 mL/min — ABNORMAL LOW (ref >60.00)
Glucose, Bld: 149 mg/dL — ABNORMAL HIGH (ref 70–99)
POTASSIUM: 3.4 meq/L — AB (ref 3.5–5.1)
Sodium: 142 mEq/L (ref 135–145)

## 2017-05-16 ENCOUNTER — Other Ambulatory Visit: Payer: Self-pay

## 2017-05-16 DIAGNOSIS — E876 Hypokalemia: Secondary | ICD-10-CM

## 2017-05-16 MED ORDER — POTASSIUM CHLORIDE CRYS ER 20 MEQ PO TBCR: 40.0000 meq | EXTENDED_RELEASE_TABLET | Freq: Two times a day (BID) | ORAL | 1 refills | Status: DC

## 2017-05-25 ENCOUNTER — Other Ambulatory Visit: Payer: Self-pay

## 2017-05-25 ENCOUNTER — Ambulatory Visit (INDEPENDENT_AMBULATORY_CARE_PROVIDER_SITE_OTHER): Payer: Medicare HMO | Admitting: Physician Assistant

## 2017-05-25 ENCOUNTER — Encounter: Payer: Self-pay | Admitting: Physician Assistant

## 2017-05-25 VITALS — BP 160/80 | HR 79 | Temp 98.8°F | Ht 69.0 in | Wt 207.4 lb

## 2017-05-25 DIAGNOSIS — J069 Acute upper respiratory infection, unspecified: Secondary | ICD-10-CM | POA: Diagnosis not present

## 2017-05-25 MED ORDER — DOXYCYCLINE HYCLATE 100 MG PO CAPS
100.0000 mg | ORAL_CAPSULE | Freq: Two times a day (BID) | ORAL | 0 refills | Status: AC
Start: 2017-05-25 — End: 2017-06-04

## 2017-05-25 MED ORDER — CARVEDILOL 25 MG PO TABS: 12.5000 mg | ORAL_TABLET | Freq: Two times a day (BID) | ORAL | 3 refills | Status: DC

## 2017-05-25 NOTE — Progress Notes (Signed)
Eddie Ferguson is a 77 y.o. male here for a new problem.  I acted as a Education administrator for [employer], PA-C Anselmo Pickler, LPN  History of Present Illness:   Chief Complaint  Patient presents with  . Nasal Congestion    2 weeks on and off  . Sore Throat  . Cough    Non Productive    Sinus Problem  This is a new problem. Episode onset: Started 2 weeks ago. The problem has been waxing and waning since onset. There has been no fever. His pain is at a severity of 5/10. The pain is moderate. Associated symptoms include congestion, coughing, headaches, sinus pressure and a sore throat. Pertinent negatives include no chills, ear pain, neck pain, shortness of breath or sneezing. Past treatments include acetaminophen and saline sprays (Flonase). The treatment provided no relief.   Wife has also been sick and was taking Amoxicillin but then had to switch to a Z-pack. Appetite is fair. He feels like he is well hydrated.  Denies CP, SOB. Endorses history of sinusitis, most recently treated in June 2018 by PCP.  Past Medical History:  Diagnosis Date  . Allergy   . Ankle injury 06/01/2001   no surgery- skin debridement   . Arthritis   . Basal cell carcinoma 05/28/2014   L nasal tip. Mohs Dr. Link Snuffer.    . Blood transfusion    2010 because of stomach ulcers  . Cataract    bilaterally removed  . Diabetes mellitus   . GERD (gastroesophageal reflux disease)   . Glaucoma   . Hyperlipidemia   . Hypertension   . Hypothyroidism   . Pneumonia 04/12/2006  . Prostate cancer ([place])   . Seasonal allergies   . Sinusitis    treated for bacterial infection at least once a year at [facility]  . Stomach ulcer 2010   bleeding ulcer result NSAIDS     Social History   Socioeconomic History  . Marital status: Married    Spouse name: Not on file  . Number of children: Not on file  . Years of education: Not on file  . Highest education level: Not on file  Occupational History  . Not on file  Social  Needs  . Financial resource strain: Not on file  . Food insecurity:    Worry: Not on file    Inability: Not on file  . Transportation needs:    Medical: Not on file    Non-medical: Not on file  Tobacco Use  . Smoking status: Former Smoker    Last attempt to quit: 02/03/2001    Years since quitting: 16.3  . Smokeless tobacco: Never Used  . Tobacco comment: intermittent cigar use  Substance and Sexual Activity  . Alcohol use: Yes    Alcohol/week: 0.0 oz    Comment: occasional  . Drug use: No  . Sexual activity: Not on file  Lifestyle  . Physical activity:    Days per week: Not on file    Minutes per session: Not on file  . Stress: Not on file  Relationships  . Social connections:    Talks on phone: Not on file    Gets together: Not on file    Attends religious service: Not on file    Active member of club or organization: Not on file    Attends meetings of clubs or organizations: Not on file    Relationship status: Not on file  . Intimate partner violence:    Fear  of current or ex partner: Not on file    Emotionally abused: Not on file    Physically abused: Not on file    Forced sexual activity: Not on file  Other Topics Concern  . Not on file  Social History Narrative   Married (wife patient outside practice). 2 children. 5 grandkids.       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       Hobbies: travel, enjoys going to shows, [facility] women's basketball tournament. Doesn't watch tv.             Lives in 1 story condo on the 3rd floor   Bachelor's degree    Past Surgical History:  Procedure Laterality Date  . abdominl abcess     03-15-2011  . BACK SURGERY    . CATARACT EXTRACTION, BILATERAL     04-07-2014, 03-17-2014  . COLONOSCOPY     2012, 5 year repeat  . CYSTOURETHROSCOPY  11/11/1998  . ESOPHAGOGASTRODUODENOSCOPY  2010   [facility]  . LUMBAR LAMINECTOMY  2007   [place]  . MOHS SURGERY     left side of nose  . POLYPECTOMY    . PROSTATE BIOPSY     radiation 2016   . RECONSTRUCTION TENDON PULLEY W/ TENDON / FASCIAL GRAFT OF HAND / FINGER  1982   rt 3rd finger  . RECONSTRUCTION TENDON PULLEY W/ TENDON / FASCIAL GRAFT OF HAND / FINGER  1953   rt 5th finger  . UPPER GASTROINTESTINAL ENDOSCOPY      Family History  Problem Relation Age of Onset  . Diabetes Mother   . Diabetes Father   . Hyperlipidemia Father   . Colon polyps Father   . Brain cancer Brother        smoker  . Dementia Paternal Grandmother   . Colon cancer Neg Hx   . Esophageal cancer Neg Hx   . Stomach cancer Neg Hx   . Rectal cancer Neg Hx     Allergies  Allergen Reactions  . Aspirin Other (See Comments)    Stomach ulcers  . Nsaids Other (See Comments)    Stomach ulcers  . Amoxicillin-Pot Clavulanate Rash  . Sulfa Antibiotics Swelling and Rash    Current Medications:   Current Outpatient Medications:  .  acetaminophen (TYLENOL) 500 MG tablet, Take 500 mg by mouth every 6 (six) hours as needed. Reported on 06/16/2015, Disp: , Rfl:  .  allopurinol (ZYLOPRIM) 100 MG tablet, Take 1 tablet (100 mg total) by mouth 2 (two) times daily., Disp: 180 tablet, Rfl: 1 .  carvedilol (COREG) 25 MG tablet, Take 0.5 tablets (12.5 mg total) by mouth 2 (two) times daily with a meal., Disp: 180 tablet, Rfl: 3 .  chlorthalidone (HYGROTON) 25 MG tablet, Take 0.5-1 tablets (12.5-25 mg total) by mouth daily., Disp: 90 tablet, Rfl: 2 .  Cholecalciferol (VITAMIN D) 2000 UNITS CAPS, Take by mouth daily.  , Disp: , Rfl:  .  diltiazem (CARTIA XT) 180 MG 24 hr capsule, Take 1 capsule (180 mg total) by mouth 2 (two) times daily., Disp: 180 capsule, Rfl: 3 .  donepezil (ARICEPT) 10 MG tablet, Take 1/2 tablet daily for a month, then increase to 1 tablet daily and continue, Disp: 30 tablet, Rfl: 11 .  fexofenadine (ALLEGRA) 180 MG tablet, Take 180 mg by mouth., Disp: , Rfl:  .  fluticasone (FLONASE) 50 MCG/ACT nasal spray, Place 2 sprays into both nostrils daily., Disp: 48 g, Rfl: 3 .   Glucosamine-Chondroit-Vit C-Mn (  GLUCOSAMINE-CHONDROITIN) CAPS, Take 1,500 mg by mouth., Disp: , Rfl:  .  glucose blood (FREESTYLE LITE) test strip, Use to check blood sugars daily. Dx: E11.9, Disp: 100 each, Rfl: 12 .  latanoprost (XALATAN) 0.005 % ophthalmic solution, Place 1 drop into both eyes at bedtime. , Disp: , Rfl:  .  levothyroxine (SYNTHROID) 50 MCG tablet, Take 1 tablet (50 mcg total) by mouth daily., Disp: 90 tablet, Rfl: 3 .  methocarbamol (ROBAXIN) 750 MG tablet, Take 1 tablet (750 mg total) by mouth every 8 (eight) hours as needed for muscle spasms., Disp: 60 tablet, Rfl: 3 .  Multiple Vitamin (MULTIVITAMIN) tablet, Take 1 tablet by mouth daily.  , Disp: , Rfl:  .  Omega-3 Fatty Acids (FISH OIL) 1200 MG CAPS, Take by mouth daily.  , Disp: , Rfl:  .  omeprazole (PRILOSEC) 40 MG capsule, Take 1 capsule (40 mg total) by mouth daily., Disp: 30 capsule, Rfl: 2 .  potassium chloride SA (KLOR-CON M20) 20 MEQ tablet, Take 2 tablets (40 mEq total) by mouth 2 (two) times daily., Disp: 360 tablet, Rfl: 1 .  pravastatin (PRAVACHOL) 40 MG tablet, Take 1 tablet (40 mg total) by mouth daily., Disp: 90 tablet, Rfl: 3 .  quinapril (ACCUPRIL) 40 MG tablet, Take 1 tablet (40 mg total) by mouth daily., Disp: 90 tablet, Rfl: 3 .  doxycycline (VIBRAMYCIN) 100 MG capsule, Take 1 capsule (100 mg total) by mouth 2 (two) times daily for 10 days., Disp: 20 capsule, Rfl: 0   Review of Systems:   Review of Systems  Constitutional: Negative for chills.  HENT: Positive for congestion, sinus pressure and sore throat. Negative for ear pain and sneezing.   Respiratory: Positive for cough. Negative for shortness of breath.   Musculoskeletal: Negative for neck pain.  Neurological: Positive for headaches.    Vitals:   Vitals:   05/25/17 1026 05/25/17 1059  BP: (!) 160/92 (!) 160/80  Pulse: 79   Temp: 98.8 F (37.1 C)   TempSrc: Oral   SpO2: 93%   Weight: 207 lb 6.4 oz (94.1 kg)   Height: 5\' 9"  (1.753 m)       Body mass index is 30.63 kg/m.  Physical Exam:   Physical Exam  Constitutional: He appears well-developed. He is cooperative.  Non-toxic appearance. He does not have a sickly appearance. He does not appear ill. No distress.  HENT:  Head: Normocephalic and atraumatic.  Right Ear: Tympanic membrane, external ear and ear canal normal. Tympanic membrane is not erythematous, not retracted and not bulging.  Left Ear: Tympanic membrane, external ear and ear canal normal. Tympanic membrane is not erythematous, not retracted and not bulging.  Nose: Mucosal edema and rhinorrhea present. Right sinus exhibits frontal sinus tenderness. Right sinus exhibits no maxillary sinus tenderness. Left sinus exhibits frontal sinus tenderness. Left sinus exhibits no maxillary sinus tenderness.  Mouth/Throat: Uvula is midline and mucous membranes are normal. Posterior oropharyngeal erythema present. No posterior oropharyngeal edema. Tonsils are 0 on the right. Tonsils are 0 on the left.  Eyes: Conjunctivae and lids are normal.  Neck: Trachea normal.  Cardiovascular: Normal rate, regular rhythm, S1 normal, S2 normal and normal heart sounds.  Pulmonary/Chest: Effort normal and breath sounds normal. He has no decreased breath sounds. He has no wheezes. He has no rhonchi. He has no rales.  Lymphadenopathy:    He has no cervical adenopathy.  Neurological: He is alert.  Skin: Skin is warm, dry and intact.  Psychiatric: He has a  normal mood and affect. His speech is normal and behavior is normal.  Nursing note and vitals reviewed.   Assessment and Plan:    Mykeal was seen today for nasal congestion, sore throat and cough.  Diagnoses and all orders for this visit:  Upper respiratory tract infection, unspecified type  Other orders -     doxycycline (VIBRAMYCIN) 100 MG capsule; Take 1 capsule (100 mg total) by mouth 2 (two) times daily for 10 days.    Will initiate Doxycycline per orders given duration of  symptoms. Discussed taking medications as prescribed. Reviewed return precautions including worsening fever, SOB, worsening cough or other concerns. Push fluids and rest. I recommend that patient follow-up if symptoms worsen or persist despite treatment x 7-10 days, sooner if needed.  I asked him to keep an eye on his blood pressure and if it didn't improve, stated that he needs to let us know.    . Reviewed expectations re: course of current medical issues. . Discussed self-management of symptoms. . Outlined signs and symptoms indicating need for more acute intervention. . Patient verbalized understanding and all questions were answered. . See orders for this visit as documented in the electronic medical record. . Patient received an After-Visit Summary.  CMA or LPN served as scribe during this visit. History, Physical, and Plan performed by medical provider. Documentation and orders reviewed and attested to.  Inda Coke, PA-C

## 2017-05-25 NOTE — Patient Instructions (Signed)
It was great to meet you!  If you continue to have issues if consistently >150/90, please let me know.  Start doxycycline oral antibiotic and take as advised.  Push fluids and get plenty of rest. Please return if you are not improving as expected, or if you have high fevers (>101.5) or difficulty swallowing or worsening productive cough.  Call clinic with questions.  I hope you start feeling better soon!

## 2017-06-01 DIAGNOSIS — Z85828 Personal history of other malignant neoplasm of skin: Secondary | ICD-10-CM | POA: Diagnosis not present

## 2017-06-01 DIAGNOSIS — D235 Other benign neoplasm of skin of trunk: Secondary | ICD-10-CM | POA: Diagnosis not present

## 2017-06-01 DIAGNOSIS — L430 Hypertrophic lichen planus: Secondary | ICD-10-CM | POA: Diagnosis not present

## 2017-06-01 DIAGNOSIS — L720 Epidermal cyst: Secondary | ICD-10-CM | POA: Diagnosis not present

## 2017-06-01 DIAGNOSIS — L570 Actinic keratosis: Secondary | ICD-10-CM | POA: Diagnosis not present

## 2017-06-01 DIAGNOSIS — D485 Neoplasm of uncertain behavior of skin: Secondary | ICD-10-CM | POA: Diagnosis not present

## 2017-06-01 DIAGNOSIS — L821 Other seborrheic keratosis: Secondary | ICD-10-CM | POA: Diagnosis not present

## 2017-06-06 ENCOUNTER — Other Ambulatory Visit (INDEPENDENT_AMBULATORY_CARE_PROVIDER_SITE_OTHER): Payer: Medicare HMO

## 2017-06-06 DIAGNOSIS — E876 Hypokalemia: Secondary | ICD-10-CM

## 2017-06-06 LAB — BASIC METABOLIC PANEL
BUN: 12 mg/dL (ref 6–23)
CALCIUM: 9.3 mg/dL (ref 8.4–10.5)
CO2: 29 mEq/L (ref 19–32)
Chloride: 101 mEq/L (ref 96–112)
Creatinine, Ser: 1.34 mg/dL (ref 0.40–1.50)
GFR: 54.65 mL/min — AB (ref >60.00)
GLUCOSE: 135 mg/dL — AB (ref 70–99)
Potassium: 3.6 mEq/L (ref 3.5–5.1)
SODIUM: 139 meq/L (ref 135–145)

## 2017-06-06 LAB — MAGNESIUM: Magnesium: 2 mg/dL (ref 1.5–2.5)

## 2017-06-08 ENCOUNTER — Ambulatory Visit (INDEPENDENT_AMBULATORY_CARE_PROVIDER_SITE_OTHER): Payer: Medicare HMO | Admitting: *Deleted

## 2017-06-08 ENCOUNTER — Telehealth: Payer: Self-pay

## 2017-06-08 ENCOUNTER — Encounter: Payer: Self-pay | Admitting: *Deleted

## 2017-06-08 VITALS — BP 146/70 | HR 68 | Ht 68.0 in | Wt 208.0 lb

## 2017-06-08 DIAGNOSIS — Z0000 Encounter for general adult medical examination without abnormal findings: Secondary | ICD-10-CM

## 2017-06-08 NOTE — Telephone Encounter (Signed)
Call to [place] locally to call back regarding early memory programs for the patient and his spouse. Left my number [phone]

## 2017-06-08 NOTE — Progress Notes (Signed)
I have reviewed and agree with note, evaluation, plan.   Jonnette Nuon, MD  

## 2017-06-08 NOTE — Patient Instructions (Addendum)
Eddie Ferguson , Thank you for taking time to come for your Medicare Wellness Visit. I appreciate your ongoing commitment to your health goals. Please review the following plan we discussed and let me know if I can assist you in the future.   Educated to check with insurance regarding coverage of Shingles vaccination on Part D or Part B and may have lower co-pay if provided on the Part D side Shingrix is a vaccine for the prevention of Shingles in Adults 18 and older. Please check with your benefits regarding applicable copays or out of pocket expenses.  The Shingrix is given in 2 vaccines approx 8 weeks apart. You must receive the 2nd dose prior to 6 months from receipt of the first.    Given resources regarding Alz Early memory loss group for you and your spouse. The next class may be summer. You can call and ask.   Alzheimer's Association / Family information and training  https://www.clark-whitaker.org/.asp Loss adjuster, chartered for information  Driving resource center; Can send out driving contract  Get help & support Media planner  Support groups  Education programs  SAFETY; TeleconferenceOnDemand.fr.asp#howdementiaaffects   [facility] [address], [address] [place], [place] [address] P: [phone] F: [phone]  Families to call the 800 number to get information on all the resources in the area 24 hour 1 ([phone]   Can provide resources; Questions about Dementia; Support groups; Give the caregiver information regarding respite ([facility]) Call the [place] on Aging;as they oversee who gets the grant fund for certain areas (315-176-1607) Also have Tools to help navigate the needs of the patient  Opportunities for free educational programs online 100 support groups [place];  Just starting Early stage program and care partners for the patient and the family 12 weeks of a  support group;  12 weeks of social engagement component End with Educational wrap up [place] the 800 number given for more information  Call to the [place]; and they will call me back regarding classes    F: [phone]       Prevention of falls: Remove rugs or any tripping hazards in the home Use Non slip mats in bathtubs and showers Placing grab bars next to the toilet and or shower Placing handrails on both sides of the stair way Adding extra lighting in the home.   Personal safety issues reviewed:  1. Consider starting a community watch program per [facility] 2.  Changes batteries is smoke detector and/or carbon monoxide detector  3.  If you have firearms; keep them in a safe place 4.  Wear protection when in the sun; Always wear sunscreen or a hat; It is good to have your doctor check your skin annually or review any new areas of concern 5. Driving safety; Keep in the right lane; stay 3 car lengths behind the car in front of you on the highway; look 3 times prior to pulling out; carry your cell phone everywhere you go!      These are the goals we discussed: Goals    . Patient Stated     Keep moving and staying engaged in life May consider silver sneakers        This is a list of the screening recommended for you and due dates:  Health Maintenance  Topic Date Due  . Complete foot exam   09/28/2017  . Flu Shot  10/05/2017  . Hemoglobin A1C  10/16/2017  . Eye exam  for diabetics  01/09/2018  . Colon Cancer Screening  04/13/2021  . Tetanus Vaccine  04/23/2024  . Pneumonia vaccines  Completed   Meal Ideas & Options:   Breakfast:  ? Whole wheat toast or whole wheat English muffins with peanut butter & hard boiled egg ? Steel cut oats topped with apples & cinnamon and skim milk  ? Fresh fruit: banana, strawberries, melon, berries, peaches  ? Smoothies: strawberries, bananas, greek yogurt, peanut butter ? Low fat greek yogurt with blueberries and  granola  ? Egg white omelet with spinach and mushrooms ? Breakfast couscous: whole wheat couscous, apricots, skim milk, cranberries   Sandwiches:  ? Hummus and grilled vegetables (peppers, zucchini, squash) on whole wheat bread   ? Grilled chicken on whole wheat pita with lettuce, tomatoes, cucumbers or tzatziki  ? Tuna salad on whole wheat bread: tuna salad made with greek yogurt, olives, red peppers, capers, green onions ? Garlic rosemary lamb pita: lamb sauted with garlic, rosemary, salt & pepper; add lettuce, cucumber, greek yogurt to pita - flavor with lemon juice and black pepper   Seafood:  ? Mediterranean grilled salmon, seasoned with garlic, basil, parsley, lemon juice and black pepper ? Shrimp, lemon, and spinach whole-grain pasta salad made with low fat greek yogurt  ? Seared scallops with lemon orzo  ? Seared tuna steaks seasoned salt, pepper, coriander topped with tomato mixture of olives, tomatoes, olive oil, minced garlic, parsley, green onions and cappers   Meats:  ? Herbed greek chicken salad with kalamata olives, cucumber, feta  ? Red bell peppers stuffed with spinach, bulgur, lean ground beef (or lentils) & topped with feta   ? Kebabs: skewers of chicken, tomatoes, onions, zucchini, squash  ? [place] burgers: made with red onions, mint, dill, lemon juice, feta cheese topped with roasted red peppers  Vegetarian ? Cucumber salad: cucumbers, artichoke hearts, celery, red onion, feta cheese, tossed in olive oil & lemon juice  ? Hummus and whole grain pita points with a greek salad (lettuce, tomato, feta, olives, cucumbers, red onion) ? Lentil soup with celery, carrots made with vegetable broth, garlic, salt and pepper  ? Tabouli salad: parsley, bulgur, mint, scallions, cucumbers, tomato, radishes, lemon juice, olive oil, salt and pepper.    Fall Prevention in the Home Falls can cause injuries. They can happen to people of all ages. There are many things you can do to  make your home safe and to help prevent falls. What can I do on the outside of my home?  Regularly fix the edges of walkways and driveways and fix any cracks.  Remove anything that might make you trip as you walk through a door, such as a raised step or threshold.  Trim any bushes or trees on the path to your home.  Use bright outdoor lighting.  Clear any walking paths of anything that might make someone trip, such as rocks or tools.  Regularly check to see if handrails are loose or broken. Make sure that both sides of any steps have handrails.  Any raised decks and porches should have guardrails on the edges.  Have any leaves, snow, or ice cleared regularly.  Use sand or salt on walking paths during winter.  Clean up any spills in your garage right away. This includes oil or grease spills. What can I do in the bathroom?  Use night lights.  Install grab bars by the toilet and in the tub and shower. Do not use towel bars as grab bars.  Use non-skid mats or decals in the tub or shower.  If you need to sit down in the shower, use a plastic, non-slip stool.  Keep the floor dry. Clean up any water that spills on the floor as soon as it happens.  Remove soap buildup in the tub or shower regularly.  Attach bath mats securely with double-sided non-slip rug tape.  Do not have throw rugs and other things on the floor that can make you trip. What can I do in the bedroom?  Use night lights.  Make sure that you have a light by your bed that is easy to reach.  Do not use any sheets or blankets that are too big for your bed. They should not hang down onto the floor.  Have a firm chair that has side arms. You can use this for support while you get dressed.  Do not have throw rugs and other things on the floor that can make you trip. What can I do in the kitchen?  Clean up any spills right away.  Avoid walking on wet floors.  Keep items that you use a lot in easy-to-reach  places.  If you need to reach something above you, use a strong step stool that has a grab bar.  Keep electrical cords out of the way.  Do not use floor polish or wax that makes floors slippery. If you must use wax, use non-skid floor wax.  Do not have throw rugs and other things on the floor that can make you trip. What can I do with my stairs?  Do not leave any items on the stairs.  Make sure that there are handrails on both sides of the stairs and use them. Fix handrails that are broken or loose. Make sure that handrails are as long as the stairways.  Check any carpeting to make sure that it is firmly attached to the stairs. Fix any carpet that is loose or worn.  Avoid having throw rugs at the top or bottom of the stairs. If you do have throw rugs, attach them to the floor with carpet tape.  Make sure that you have a light switch at the top of the stairs and the bottom of the stairs. If you do not have them, ask someone to add them for you. What else can I do to help prevent falls?  Wear shoes that: ? Do not have high heels. ? Have rubber bottoms. ? Are comfortable and fit you well. ? Are closed at the toe. Do not wear sandals.  If you use a stepladder: ? Make sure that it is fully opened. Do not climb a closed stepladder. ? Make sure that both sides of the stepladder are locked into place. ? Ask someone to hold it for you, if possible.  Clearly mark and make sure that you can see: ? Any grab bars or handrails. ? First and last steps. ? Where the edge of each step is.  Use tools that help you move around (mobility aids) if they are needed. These include: ? Canes. ? Walkers. ? Scooters. ? Crutches.  Turn on the lights when you go into a dark area. Replace any light bulbs as soon as they burn out.  Set up your furniture so you have a clear path. Avoid moving your furniture around.  If any of your floors are uneven, fix them.  If there are any pets around you, be  aware of where they are.  Review your medicines  with your doctor. Some medicines can make you feel dizzy. This can increase your chance of falling. Ask your doctor what other things that you can do to help prevent falls. This information is not intended to replace advice given to you by your health care provider. Make sure you discuss any questions you have with your health care provider. Document Released: 12/18/2008 Document Revised: 07/30/2015 Document Reviewed: 03/28/2014 Elsevier Interactive Patient Education  2018 [place] Maintenance, Male A healthy lifestyle and preventive care is important for your health and wellness. Ask your health care provider about what schedule of regular examinations is right for you. What should I know about weight and diet? Eat a Healthy Diet  Eat plenty of vegetables, fruits, whole grains, low-fat dairy products, and lean protein.  Do not eat a lot of foods high in solid fats, added sugars, or salt.  Maintain a Healthy Weight Regular exercise can help you achieve or maintain a healthy weight. You should:  Do at least 150 minutes of exercise each week. The exercise should increase your heart rate and make you sweat (moderate-intensity exercise).  Do strength-training exercises at least twice a week.  Watch Your Levels of Cholesterol and Blood Lipids  Have your blood tested for lipids and cholesterol every 5 years starting at 79 years of age. If you are at high risk for heart disease, you should start having your blood tested when you are 79 years old. You may need to have your cholesterol levels checked more often if: ? Your lipid or cholesterol levels are high. ? You are older than 79 years of age. ? You are at high risk for heart disease.  What should I know about cancer screening? Many types of cancers can be detected early and may often be prevented. Lung Cancer  You should be screened every year for lung cancer if: ? You are  a current smoker who has smoked for at least 30 years. ? You are a former smoker who has quit within the past 15 years.  Talk to your health care provider about your screening options, when you should start screening, and how often you should be screened.  Colorectal Cancer  Routine colorectal cancer screening usually begins at 79 years of age and should be repeated every 5-10 years until you are 79 years old. You may need to be screened more often if early forms of precancerous polyps or small growths are found. Your health care provider may recommend screening at an earlier age if you have risk factors for colon cancer.  Your health care provider may recommend using home test kits to check for hidden blood in the stool.  A small camera at the end of a tube can be used to examine your colon (sigmoidoscopy or colonoscopy). This checks for the earliest forms of colorectal cancer.  Prostate and Testicular Cancer  Depending on your age and overall health, your health care provider may do certain tests to screen for prostate and testicular cancer.  Talk to your health care provider about any symptoms or concerns you have about testicular or prostate cancer.  Skin Cancer  Check your skin from head to toe regularly.  Tell your health care provider about any new moles or changes in moles, especially if: ? There is a change in a mole's size, shape, or color. ? You have a mole that is larger than a pencil eraser.  Always use sunscreen. Apply sunscreen liberally and repeat throughout the day.  Protect yourself by wearing long sleeves, pants, a wide-brimmed hat, and sunglasses when outside.  What should I know about heart disease, diabetes, and high blood pressure?  If you are 36-34 years of age, have your blood pressure checked every 3-5 years. If you are 56 years of age or older, have your blood pressure checked every year. You should have your blood pressure measured twice-once when you are  at a hospital or clinic, and once when you are not at a hospital or clinic. Record the average of the two measurements. To check your blood pressure when you are not at a hospital or clinic, you can use: ? An automated blood pressure machine at a pharmacy. ? A home blood pressure monitor.  Talk to your health care provider about your target blood pressure.  If you are between 33-17 years old, ask your health care provider if you should take aspirin to prevent heart disease.  Have regular diabetes screenings by checking your fasting blood sugar level. ? If you are at a normal weight and have a low risk for diabetes, have this test once every three years after the age of 24. ? If you are overweight and have a high risk for diabetes, consider being tested at a younger age or more often.  A one-time screening for abdominal aortic aneurysm (AAA) by ultrasound is recommended for men aged 29-75 years who are current or former smokers. What should I know about preventing infection? Hepatitis B If you have a higher risk for hepatitis B, you should be screened for this virus. Talk with your health care provider to find out if you are at risk for hepatitis B infection. Hepatitis C Blood testing is recommended for:  Everyone born from [birth year] through 1967.  Anyone with known risk factors for hepatitis C.  Sexually Transmitted Diseases (STDs)  You should be screened each year for STDs including gonorrhea and chlamydia if: ? You are sexually active and are younger than 79 years of age. ? You are older than 79 years of age and your health care provider tells you that you are at risk for this type of infection. ? Your sexual activity has changed since you were last screened and you are at an increased risk for chlamydia or gonorrhea. Ask your health care provider if you are at risk.  Talk with your health care provider about whether you are at high risk of being infected with HIV. Your health care  provider may recommend a prescription medicine to help prevent HIV infection.  What else can I do?  Schedule regular health, dental, and eye exams.  Stay current with your vaccines (immunizations).  Do not use any tobacco products, such as cigarettes, chewing tobacco, and e-cigarettes. If you need help quitting, ask your health care provider.  Limit alcohol intake to no more than 2 drinks per day. One drink equals 12 ounces of beer, 5 ounces of wine, or 1 ounces of hard liquor.  Do not use street drugs.  Do not share needles.  Ask your health care provider for help if you need support or information about quitting drugs.  Tell your health care provider if you often feel depressed.  Tell your health care provider if you have ever been abused or do not feel safe at home. This information is not intended to replace advice given to you by your health care provider. Make sure you discuss any questions you have with your health care provider. Document Released: 08/20/2007 Document  Revised: 10/21/2015 Document Reviewed: 11/25/2014 Elsevier Interactive Patient Education  2018 [employer].   Hearing Loss Hearing loss is a partial or total loss of the ability to hear. This can be temporary or permanent, and it can happen in one or both ears. Hearing loss may be referred to as deafness. Medical care is necessary to treat hearing loss properly and to prevent the condition from getting worse. Your hearing may partially or completely come back, depending on what caused your hearing loss and how severe it is. In some cases, hearing loss is permanent. What are the causes? Common causes of hearing loss include:  Too much wax in the ear canal.  Infection of the ear canal or middle ear.  Fluid in the middle ear.  Injury to the ear or surrounding area.  An object stuck in the ear.  Prolonged exposure to loud sounds, such as music.  Less common causes of hearing loss include:  Tumors in  the ear.  Viral or bacterial infections, such as meningitis.  A hole in the eardrum (perforated eardrum).  Problems with the hearing nerve that sends signals between the brain and the ear.  Certain medicines.  What are the signs or symptoms? Symptoms of this condition may include:  Difficulty telling the difference between sounds.  Difficulty following a conversation when there is background noise.  Lack of response to sounds in your environment. This may be most noticeable when you do not respond to startling sounds.  Needing to turn up the volume on the television, radio, etc.  Ringing in the ears.  Dizziness.  Pain in the ears.  How is this diagnosed? This condition is diagnosed based on a physical exam and a hearing test (audiometry). The audiometry test will be performed by a hearing specialist (audiologist). You may also be referred to an ear, nose, and throat (ENT) specialist (otolaryngologist). How is this treated? Treatment for recent onset of hearing loss may include:  Ear wax removal.  Being prescribed medicines to prevent infection (antibiotics).  Being prescribed medicines to reduce inflammation (corticosteroids).  Follow these instructions at home:  If you were prescribed an antibiotic medicine, take it as told by your health care provider. Do not stop taking the antibiotic even if you start to feel better.  Take over-the-counter and prescription medicines only as told by your health care provider.  Avoid loud noises.  Return to your normal activities as told by your health care provider. Ask your health care provider what activities are safe for you.  Keep all follow-up visits as told by your health care provider. This is important. Contact a health care provider if:  You feel dizzy.  You develop new symptoms.  You vomit or feel nauseous.  You have a fever. Get help right away if:  You develop sudden changes in your vision.  You have severe  ear pain.  You have new or increased weakness.  You have a severe headache. This information is not intended to replace advice given to you by your health care provider. Make sure you discuss any questions you have with your health care provider. Document Released: 02/21/2005 Document Revised: 07/30/2015 Document Reviewed: 07/09/2014 Elsevier Interactive Patient Education  2018 [employer].

## 2017-06-08 NOTE — Progress Notes (Signed)
Subjective:   [name]. Thrun is a 77 y.o. male who presents for Medicare Annual/Subsequent preventive examination.  Complains of dementia Driving is becoming an issues  Still managing finances  Has 2 sons that live in this area Has told his 2 sons to come over this month  Lives in a condo off [address]  Has an apt in July to fup with the Doctor  Live 3rd floor He and wife continue to support senior rides   Discussed [facility]   Seeing Dr. Delice Lesch and will defer mental status testing to her  Reviewed her information with the patient   BMI 32 This year, BMS 31  Diet; not a food person Likes salads;  Breakfast; banana and egg; muffin Lunch today probably a sandwich of some type Drinks a lot of unsweetened tea Supper goes out to eat   DM 2   Exercise No exercise but is no sedentary; volunteers Walks 1/4 mile is not difficult Played softball and basketball when younger    Discussed silver sneaker Can go to the Y; rec'd something in the mail regarding Silver sneakers and encouraged him to go and visit a class  Try to eat fruits and vegetables;  Given ideas for meals within the [place] but has his own ideas about what he eats   Social; has some friends they go to shows with  Going to visit her family in [place] this weekend   VISION - last eye check  DM eye exam 01/2016 / due again 01/2017  Treating glaucoma  Hearing: 2000 hz both ears/ no issues of concern for him Same this year    Cardiac Risk Factors include: advanced age (>9men, >90 women);dyslipidemia;hypertension;male gender;sedentary lifestyle        Objective:    Vitals: BP (!) 146/70   Pulse 68   Ht 5\' 8"  (1.727 m)   Wt 208 lb (94.3 kg)   SpO2 96%   BMI 31.63 kg/m   Body mass index is 31.63 kg/m.  141/70 at home this am Keeps up with his BP every day   Advanced Directives 06/08/2017 04/15/2016 01/27/2016 08/15/2014  Does Patient Have a Medical Advance Directive? No  Yes - Yes  Type of Advance Directive - - Living will Healthcare Power of [place];Living will  Does patient want to make changes to medical advance directive? - - - No - Patient declined  Copy of [place] in Chart? - - - No - copy requested   In process of working on AD with his sons   Tobacco Social History   Tobacco Use  Smoking Status Former Smoker  . Last attempt to quit: 02/03/2001  . Years since quitting: 16.3  Smokeless Tobacco Never Used  Tobacco Comment   intermittent cigar use stopped     Counseling given: Yes Comment: intermittent cigar use stopped   Clinical Intake:  Past Medical History:  Diagnosis Date  . Allergy   . Ankle injury 06/01/2001   no surgery- skin debridement   . Arthritis   . Basal cell carcinoma 05/28/2014   L nasal tip. Mohs Dr. Link Snuffer.    . Blood transfusion    2010 because of stomach ulcers  . Cataract    bilaterally removed  . Diabetes mellitus   . GERD (gastroesophageal reflux disease)   . Glaucoma   . Hyperlipidemia   . Hypertension   . Hypothyroidism   . Pneumonia 04/12/2006  . Prostate cancer ([place])   . Seasonal  allergies   . Sinusitis    treated for bacterial infection at least once a year at [facility]  . Stomach ulcer 2010   bleeding ulcer result NSAIDS   Past Surgical History:  Procedure Laterality Date  . abdominl abcess     03-15-2011  . BACK SURGERY    . CATARACT EXTRACTION, BILATERAL     04-07-2014, 03-17-2014  . COLONOSCOPY     2012, 5 year repeat  . CYSTOURETHROSCOPY  11/11/1998  . ESOPHAGOGASTRODUODENOSCOPY  2010   [facility]  . LUMBAR LAMINECTOMY  2007   [place]  . MOHS SURGERY     left side of nose  . POLYPECTOMY    . PROSTATE BIOPSY     radiation 2016  . RECONSTRUCTION TENDON PULLEY W/ TENDON / FASCIAL GRAFT OF HAND / FINGER  1982   rt 3rd finger  . RECONSTRUCTION TENDON PULLEY W/ TENDON / FASCIAL GRAFT OF HAND / FINGER  1953   rt 5th finger  . UPPER GASTROINTESTINAL ENDOSCOPY     Family  History  Problem Relation Age of Onset  . Diabetes Mother   . Diabetes Father   . Hyperlipidemia Father   . Colon polyps Father   . Brain cancer Brother        smoker  . Dementia Paternal Grandmother   . Colon cancer Neg Hx   . Esophageal cancer Neg Hx   . Stomach cancer Neg Hx   . Rectal cancer Neg Hx    Social History   Socioeconomic History  . Marital status: Married    Spouse name: Not on file  . Number of children: Not on file  . Years of education: Not on file  . Highest education level: Not on file  Occupational History  . Not on file  Social Needs  . Financial resource strain: Not on file  . Food insecurity:    Worry: Not on file    Inability: Not on file  . Transportation needs:    Medical: Not on file    Non-medical: Not on file  Tobacco Use  . Smoking status: Former Smoker    Last attempt to quit: 02/03/2001    Years since quitting: 16.3  . Smokeless tobacco: Never Used  . Tobacco comment: intermittent cigar use stopped  Substance and Sexual Activity  . Alcohol use: Yes    Alcohol/week: 0.0 oz    Comment: occasional beer  . Drug use: No  . Sexual activity: Not on file  Lifestyle  . Physical activity:    Days per week: Not on file    Minutes per session: Not on file  . Stress: Not on file  Relationships  . Social connections:    Talks on phone: Not on file    Gets together: Not on file    Attends religious service: Not on file    Active member of club or organization: Not on file    Attends meetings of clubs or organizations: Not on file    Relationship status: Not on file  Other Topics Concern  . Not on file  Social History Narrative   Married (wife patient outside practice). 2 children. 5 grandkids.       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       Hobbies: travel, enjoys going to shows, [employer] women's basketball tournament. Doesn't watch tv.             Lives in 1 story condo on the 3rd floor   Bachelor's degree  Outpatient  Encounter Medications as of 06/08/2017  Medication Sig  . acetaminophen (TYLENOL) 500 MG tablet Take 500 mg by mouth every 6 (six) hours as needed. Reported on 06/16/2015  . allopurinol (ZYLOPRIM) 100 MG tablet Take 1 tablet (100 mg total) by mouth 2 (two) times daily.  . carvedilol (COREG) 25 MG tablet Take 0.5 tablets (12.5 mg total) by mouth 2 (two) times daily with a meal.  . chlorthalidone (HYGROTON) 25 MG tablet Take 0.5-1 tablets (12.5-25 mg total) by mouth daily.  . Cholecalciferol (VITAMIN D) 2000 UNITS CAPS Take by mouth daily.    Marland Kitchen diltiazem (CARTIA XT) 180 MG 24 hr capsule Take 1 capsule (180 mg total) by mouth 2 (two) times daily.  Marland Kitchen donepezil (ARICEPT) 10 MG tablet Take 1/2 tablet daily for a month, then increase to 1 tablet daily and continue  . fexofenadine (ALLEGRA) 180 MG tablet Take 180 mg by mouth.  . fluticasone (FLONASE) 50 MCG/ACT nasal spray Place 2 sprays into both nostrils daily.  Marland Kitchen glucose blood (FREESTYLE LITE) test strip Use to check blood sugars daily. Dx: E11.9  . latanoprost (XALATAN) 0.005 % ophthalmic solution Place 1 drop into both eyes at bedtime.   Marland Kitchen levothyroxine (SYNTHROID) 50 MCG tablet Take 1 tablet (50 mcg total) by mouth daily.  . methocarbamol (ROBAXIN) 750 MG tablet Take 1 tablet (750 mg total) by mouth every 8 (eight) hours as needed for muscle spasms.  . Multiple Vitamin (MULTIVITAMIN) tablet Take 1 tablet by mouth daily.    . Omega-3 Fatty Acids (FISH OIL) 1200 MG CAPS Take by mouth daily.    Marland Kitchen omeprazole (PRILOSEC) 40 MG capsule Take 1 capsule (40 mg total) by mouth daily.  . potassium chloride SA (KLOR-CON M20) 20 MEQ tablet Take 2 tablets (40 mEq total) by mouth 2 (two) times daily.  . pravastatin (PRAVACHOL) 40 MG tablet Take 1 tablet (40 mg total) by mouth daily.  . quinapril (ACCUPRIL) 40 MG tablet Take 1 tablet (40 mg total) by mouth daily.  . Glucosamine-Chondroit-Vit C-Mn (GLUCOSAMINE-CHONDROITIN) CAPS Take 1,500 mg by mouth.   No  facility-administered encounter medications on file as of 06/08/2017.     Activities of Daily Living In your present state of health, do you have any difficulty performing the following activities: 06/08/2017  Hearing? N  Vision? N  Difficulty concentrating or making decisions? (No Data)  Comment memoery deferred to Neurology   Walking or climbing stairs? Y  Comment because of his right ankle   Dressing or bathing? N  Doing errands, shopping? N  Preparing Food and eating ? N  Using the Toilet? N  In the past six months, have you accidently leaked urine? N  Do you have problems with loss of bowel control? N  Managing your Medications? N  Managing your Finances? N  Housekeeping or managing your Housekeeping? N  Some recent data might be hidden    Patient Care Team: Marin Olp, MD as PCP - General (Family Medicine)     Dr. Noralee Chars; [facility] and he is treating his Prostate cancer and up Dr. Elvera Lennox; dermatologist  Dr. Hilarie Fredrickson GI  Dr. Teodoro Spray eye doctor Dr. Delice Lesch following mental status     Assessment:   This is a routine wellness examination for [place].  Exercise Activities and Dietary recommendations Current Exercise Habits: Home exercise routine, Type of exercise: walking, Intensity: Mild  Goals    . Patient Stated     Keep moving and staying engaged in life  May consider silver sneakers        Fall Risk Fall Risk  06/08/2017 04/18/2017 03/27/2017 04/15/2016 07/30/2015  Falls in the past year? No No No No No     Depression Screen PHQ 2/9 Scores 06/08/2017 04/18/2017 04/15/2016 07/30/2015  PHQ - 2 Score 0 0 0 0    Cognitive Function MMSE - Mini Mental State Exam 06/08/2017 04/15/2016  Not completed: Refused -  Orientation to time - 5  Orientation to Place - 5  Registration - 3  Attention/ Calculation - 5  Recall - 1  Language- name 2 objects - 2  Language- repeat - 1  Language- follow 3 step command - 3  Language- read & follow direction - 1    Write a sentence - 1  Copy design - 1  Total score - 28   Montreal Cognitive Assessment  03/27/2017  Visuospatial/ Executive (0/5) 4  Naming (0/3) 3  Attention: Read list of digits (0/2) 2  Attention: Read list of letters (0/1) 1  Attention: Serial 7 subtraction starting at 100 (0/3) 3  Language: Repeat phrase (0/2) 2  Language : Fluency (0/1) 1  Abstraction (0/2) 2  Delayed Recall (0/5) 0  Orientation (0/6) 6  Total 24      Immunization History  Administered Date(s) Administered  . Hepatitis B 06/19/2007, 11/16/2007, 04/18/2008  . Hepatitis B, ped/adol 06/19/2007, 11/16/2007, 04/18/2008  . Influenza Whole 01/27/2012  . Influenza, High Dose Seasonal PF 12/21/2015, 12/20/2016  . Influenza,inj,Quad PF,6+ Mos 02/24/2015  . Influenza-Unspecified 01/27/2012, 03/22/2013, 01/21/2014  . Meningococcal Polysaccharide 12/19/2008  . Pneumococcal Conjugate-13 08/25/2014  . Pneumococcal Polysaccharide-23 06/19/2007, 12/19/2008, 12/21/2015  . Td 04/23/2014  . Tdap 03/07/2001  . Zoster 08/12/2008      Screening Tests Health Maintenance  Topic Date Due  . FOOT EXAM  09/28/2017  . INFLUENZA VACCINE  10/05/2017  . HEMOGLOBIN A1C  10/16/2017  . OPHTHALMOLOGY EXAM  01/09/2018  . COLONOSCOPY  04/13/2021  . TETANUS/TDAP  04/23/2024  . PNA vac Low Risk Adult  Completed         Plan:      PCP Notes   Health Maintenance Educated regarding the shigrix. Given number for the Alz asso and early memory group which he and his spouse can attend. In process of getting information for him and will fup  Still driving without incident; Has GPS and understands the maps.   Fup on Dr. Amparo Bristol plan with low sugar diet. States he does not eat much sugar;  Left him and spouse menu to follow which are mediterranean for health food choices. May join a silver sneaker program if his wife goes with him  Overall, managed conversations; insight into memory, keeps a very detailed plan of his meds  which was correct when reviewing today.   Abnormal Screens  Deferred memory to Dr. Delice Lesch as he has OV q 6 months, last seen in Jan. Does not eat many sweets. BMI 31;  A1c was still in good control; states he is very eats the same thing most days and not likely to change diet at this time   Referrals  none  Patient concerns; Has asked his sons to come over this month and discuss his wishes and complete his AD.   Nurse Concerns; Very nice gentleman, good insight and judgement at this juncture.  Tolerating aricept at 10mg    Next PCP apt tbs- labs 04/10/2017 Dr. Delice Lesch in July.        I have personally reviewed  and noted the following in the patient's chart:   . Medical and social history . Use of alcohol, tobacco or illicit drugs  . Current medications and supplements . Functional ability and status . Nutritional status . Physical activity . Advanced directives . List of other physicians . Hospitalizations, surgeries, and ER visits in previous 12 months . Vitals . Screenings to include cognitive, depression, and falls . Referrals and appointments  In addition, I have reviewed and discussed with patient certain preventive protocols, quality metrics, and best practice recommendations. A written personalized care plan for preventive services as well as general preventive health recommendations were provided to patient.     Wynetta Fines, RN  06/08/2017

## 2017-06-13 NOTE — Telephone Encounter (Signed)
Call back from Annabelle Harman, SW for the [employer] in our area. Left her number for call back [phone]  Left VM to call back [phone]

## 2017-06-15 ENCOUNTER — Other Ambulatory Visit: Payer: Self-pay | Admitting: Family Medicine

## 2017-06-15 ENCOUNTER — Ambulatory Visit (INDEPENDENT_AMBULATORY_CARE_PROVIDER_SITE_OTHER): Payer: Medicare HMO | Admitting: Family Medicine

## 2017-06-15 ENCOUNTER — Encounter: Payer: Self-pay | Admitting: Family Medicine

## 2017-06-15 VITALS — BP 140/80 | HR 58 | Temp 97.4°F | Ht 68.0 in | Wt 211.2 lb

## 2017-06-15 DIAGNOSIS — C61 Malignant neoplasm of prostate: Secondary | ICD-10-CM | POA: Diagnosis not present

## 2017-06-15 DIAGNOSIS — M546 Pain in thoracic spine: Secondary | ICD-10-CM | POA: Diagnosis not present

## 2017-06-15 MED ORDER — ALLOPURINOL 100 MG PO TABS: 100.0000 mg | ORAL_TABLET | Freq: Two times a day (BID) | ORAL | 1 refills | Status: DC

## 2017-06-15 MED ORDER — CHLORTHALIDONE 25 MG PO TABS: 12.5000 mg | ORAL_TABLET | Freq: Every day | ORAL | 2 refills | Status: DC

## 2017-06-15 NOTE — Patient Instructions (Addendum)
I think you have strained your mid back and having some radiation of pain  I want you to try heat 20 minutes 3x a day  Also take 500mg  tylenol 3x a day for 3-4 days. If no improvement, increased to 1000mg  three times a day.   If you are not much improved in the next 10-14 days see [place] back so we can reevaluate. I want to see you sooner if you have worsening pain, fever, nausea, vomiting. We will consider x-rays or imaging next visit if not improved

## 2017-06-15 NOTE — Progress Notes (Signed)
Subjective:  Eddie Ferguson is a 77 y.o. year old very pleasant male patient who presents for/with See problem oriented charting ROS- no fever, chills, nausea, vomiting . Does have some thoracic back pain. No rash  Past Medical History-  Patient Active Problem List   Diagnosis Date Noted  . Mild cognitive impairment 06/16/2015    Priority: High  . Prostate cancer ([place]) 05/28/2014    Priority: High  . Diabetes mellitus type 2, controlled ([place]) 04/23/2014    Priority: High  . Gout 07/09/2015    Priority: Medium  . Essential hypertension 04/23/2014    Priority: Medium  . Hyperlipidemia 04/23/2014    Priority: Medium  . Hypothyroidism 04/23/2014    Priority: Medium  . Stomach ulcer     Priority: Medium  . Adenomatous colon polyp 05/28/2014    Priority: Low  . Osteoarthritis 05/28/2014    Priority: Low  . Erectile dysfunction 05/28/2014    Priority: Low  . Basal cell carcinoma 05/28/2014    Priority: Low  . GERD (gastroesophageal reflux disease) 04/23/2014    Priority: Low  . Allergic rhinitis 04/23/2014    Priority: Low  . Glaucoma 04/23/2014    Priority: Low  . CKD (chronic kidney disease), stage III ([place]) 04/18/2017    Medications- reviewed and updated Current Outpatient Medications  Medication Sig Dispense Refill  . omeprazole (PRILOSEC) 20 MG capsule Take 20 mg by mouth daily.    Marland Kitchen acetaminophen (TYLENOL) 500 MG tablet Take 500 mg by mouth every 6 (six) hours as needed. Reported on 06/16/2015    . allopurinol (ZYLOPRIM) 100 MG tablet Take 1 tablet (100 mg total) by mouth 2 (two) times daily. 180 tablet 1  . carvedilol (COREG) 25 MG tablet Take 0.5 tablets (12.5 mg total) by mouth 2 (two) times daily with a meal. 180 tablet 3  . chlorthalidone (HYGROTON) 25 MG tablet Take 0.5-1 tablets (12.5-25 mg total) by mouth daily. 90 tablet 2  . Cholecalciferol (VITAMIN D) 2000 UNITS CAPS Take by mouth daily.      Marland Kitchen diltiazem (CARTIA XT) 180 MG 24 hr capsule Take 1 capsule (180  mg total) by mouth 2 (two) times daily. 180 capsule 3  . donepezil (ARICEPT) 10 MG tablet Take 1/2 tablet daily for a month, then increase to 1 tablet daily and continue 30 tablet 11  . fexofenadine (ALLEGRA) 180 MG tablet Take 180 mg by mouth.    . fluticasone (FLONASE) 50 MCG/ACT nasal spray Place 2 sprays into both nostrils daily. 48 g 3  . glucose blood (FREESTYLE LITE) test strip Use to check blood sugars daily. Dx: E11.9 100 each 12  . latanoprost (XALATAN) 0.005 % ophthalmic solution Place 1 drop into both eyes at bedtime.     Marland Kitchen levothyroxine (SYNTHROID) 50 MCG tablet Take 1 tablet (50 mcg total) by mouth daily. 90 tablet 3  . methocarbamol (ROBAXIN) 750 MG tablet Take 1 tablet (750 mg total) by mouth every 8 (eight) hours as needed for muscle spasms. 60 tablet 3  . Multiple Vitamin (MULTIVITAMIN) tablet Take 1 tablet by mouth daily.      . Omega-3 Fatty Acids (FISH OIL) 1200 MG CAPS Take by mouth daily.      . potassium chloride SA (KLOR-CON M20) 20 MEQ tablet Take 2 tablets (40 mEq total) by mouth 2 (two) times daily. 360 tablet 1  . pravastatin (PRAVACHOL) 40 MG tablet Take 1 tablet (40 mg total) by mouth daily. 90 tablet 3  . quinapril (ACCUPRIL)  40 MG tablet Take 1 tablet (40 mg total) by mouth daily. 90 tablet 3   No current facility-administered medications for this visit.     Objective: BP 140/80 (BP Location: Left Arm, Patient Position: Sitting, Cuff Size: Normal)   Pulse (!) 58   Temp (!) 97.4 F (36.3 C) (Oral)   Ht 5\' 8"  (1.727 m)   Wt 211 lb 3.2 oz (95.8 kg)   SpO2 96%   BMI 32.11 kg/m  Gen: NAD, resting comfortably CV: RRR no murmurs rubs or gallops Lungs: CTAB no crackles, wheeze, rhonchi Abdomen: soft/very mild tenderness in left lower abdomen- otherwise no pain with palpation/nondistended/normal bowel sounds. No rebound or guarding.  Ext: no edema Skin: warm, dry, he did have a cyst about 2 x 2 cm on back which we manually palpated/drained with his permission-  he had fair amount of cottage cheese like discharge- ikely sebacious cyst MSK: pain with palpation of lateral thoracic left back. No central back pain. Good ROM hips without pain. No groin pain with palpation. No pain over greater trochanter.   Assessment/Plan:  Acute left-sided thoracic back pain  Prostate cancer ([place]) S: left hip pain more lateral- also some pain into left thoracic (actually left thoracic is the worst of his pain) as well as low back as well as left lower abdomen. Going on for about a month. Getting slightly better since he called for appointment last week. Seems to wax and wane. Can hurt into left upper abdomen as well. No nausea or vomiting. Having bowel movement each day  (even 3-4 at times) and is soft. No hard stools or straining. No fever or chills. Worst of his pain is 3/10.  A/P: from avs "I think you have strained your mid back and having some radiation of pain  I want you to try heat 20 minutes 3x a day  Also take 500mg  tylenol 3x a day for 3-4 days. If no improvement, increased to 1000mg  three times a day.   If you are not much improved in the next 10-14 days see [place] back so we can reevaluate. I want to see you sooner if you have worsening pain, fever, nausea, vomiting. We will consider x-rays or imaging next visit if not improved"  I do not see red flags on exam or from history. The duration of 1 month does concern me some but he is already improving today. Doesn't sound like constipation. Has pain on exam with palpation of the thoracic left back but it does not cause radiation around to abdomen. I would consider thoracic films as well as labs if he fails to improve particularly with his history prostate cancer though PSA has trended down (for some reason I cannot access 01/11/17 note at this time in epic "unable to display the image, image cannot be retrieved from the server at this time". If that is unrevealing, would consider CT scan. Wondered about shingles- no rash  and has been a month so strongly doubt.   Future Appointments  Date Time Provider [place]  09/26/2017 10:00 AM Cameron Sprang, MD [facility] None   Meds ordered this encounter  Medications  . allopurinol (ZYLOPRIM) 100 MG tablet    Sig: Take 1 tablet (100 mg total) by mouth 2 (two) times daily.    Dispense:  180 tablet    Refill:  1  . chlorthalidone (HYGROTON) 25 MG tablet    Sig: Take 0.5-1 tablets (12.5-25 mg total) by mouth daily.    Dispense:  90 tablet    Refill:  2   Return precautions advised.  Garret Reddish, MD

## 2017-06-27 NOTE — Telephone Encounter (Signed)
Call to Eddie Ferguson  And left VM that the caregiver group is no longer planned but there is an online patient and caregiver group he can outreach at the [employer]. Online. I will mail out further information

## 2017-07-11 DIAGNOSIS — H401131 Primary open-angle glaucoma, bilateral, mild stage: Secondary | ICD-10-CM | POA: Diagnosis not present

## 2017-07-12 DIAGNOSIS — Z8546 Personal history of malignant neoplasm of prostate: Secondary | ICD-10-CM | POA: Diagnosis not present

## 2017-07-12 LAB — PSA: PSA: 0.137

## 2017-08-02 ENCOUNTER — Encounter: Payer: Self-pay | Admitting: Family Medicine

## 2017-08-02 ENCOUNTER — Ambulatory Visit (INDEPENDENT_AMBULATORY_CARE_PROVIDER_SITE_OTHER): Payer: Medicare HMO

## 2017-08-02 ENCOUNTER — Ambulatory Visit (INDEPENDENT_AMBULATORY_CARE_PROVIDER_SITE_OTHER): Payer: Medicare HMO | Admitting: Family Medicine

## 2017-08-02 VITALS — BP 148/68 | HR 51 | Temp 97.8°F | Ht 68.0 in | Wt 208.0 lb

## 2017-08-02 DIAGNOSIS — E039 Hypothyroidism, unspecified: Secondary | ICD-10-CM | POA: Diagnosis not present

## 2017-08-02 DIAGNOSIS — I10 Essential (primary) hypertension: Secondary | ICD-10-CM | POA: Diagnosis not present

## 2017-08-02 DIAGNOSIS — E1121 Type 2 diabetes mellitus with diabetic nephropathy: Secondary | ICD-10-CM

## 2017-08-02 DIAGNOSIS — N183 Chronic kidney disease, stage 3 unspecified: Secondary | ICD-10-CM

## 2017-08-02 DIAGNOSIS — E785 Hyperlipidemia, unspecified: Secondary | ICD-10-CM

## 2017-08-02 DIAGNOSIS — M546 Pain in thoracic spine: Secondary | ICD-10-CM

## 2017-08-02 DIAGNOSIS — C61 Malignant neoplasm of prostate: Secondary | ICD-10-CM | POA: Diagnosis not present

## 2017-08-02 DIAGNOSIS — M4804 Spinal stenosis, thoracic region: Secondary | ICD-10-CM | POA: Diagnosis not present

## 2017-08-02 DIAGNOSIS — Z8546 Personal history of malignant neoplasm of prostate: Secondary | ICD-10-CM | POA: Diagnosis not present

## 2017-08-02 LAB — COMPREHENSIVE METABOLIC PANEL
ALT: 14 U/L (ref 0–53)
AST: 17 U/L (ref 0–37)
Albumin: 4.1 g/dL (ref 3.5–5.2)
Alkaline Phosphatase: 58 U/L (ref 39–117)
BUN: 13 mg/dL (ref 6–23)
CALCIUM: 9.3 mg/dL (ref 8.4–10.5)
CHLORIDE: 99 meq/L (ref 96–112)
CO2: 32 meq/L (ref 19–32)
CREATININE: 1.37 mg/dL (ref 0.40–1.50)
GFR: 53.25 mL/min — ABNORMAL LOW (ref >60.00)
Glucose, Bld: 135 mg/dL — ABNORMAL HIGH (ref 70–99)
Potassium: 3.3 mEq/L — ABNORMAL LOW (ref 3.5–5.1)
Sodium: 139 mEq/L (ref 135–145)
Total Bilirubin: 0.6 mg/dL (ref 0.2–1.2)
Total Protein: 6.6 g/dL (ref 6.0–8.3)

## 2017-08-02 LAB — CBC
HCT: 40.2 % (ref 39.0–52.0)
Hemoglobin: 14 g/dL (ref 13.0–17.0)
MCHC: 34.7 g/dL (ref 30.0–36.0)
MCV: 92.4 fl (ref 78.0–100.0)
PLATELETS: 217 10*3/uL (ref 150.0–400.0)
RBC: 4.35 Mil/uL (ref 4.22–5.81)
RDW: 14.8 % (ref 11.5–15.5)
WBC: 7.4 10*3/uL (ref 4.0–10.5)

## 2017-08-02 LAB — LIPID PANEL
CHOL/HDL RATIO: 3
Cholesterol: 147 mg/dL (ref 0–200)
HDL: 53.4 mg/dL (ref >39.00)
LDL CALC: 73 mg/dL (ref 0–99)
NONHDL: 93.47
Triglycerides: 104 mg/dL (ref 0.0–149.0)
VLDL: 20.8 mg/dL (ref 0.0–40.0)

## 2017-08-02 LAB — HEMOGLOBIN A1C: Hgb A1c MFr Bld: 6.4 % (ref 4.6–6.5)

## 2017-08-02 LAB — TSH: TSH: 2.74 u[IU]/mL (ref 0.35–4.50)

## 2017-08-02 MED ORDER — AMLODIPINE BESYLATE 5 MG PO TABS: 5.0000 mg | ORAL_TABLET | Freq: Every day | ORAL | 5 refills | Status: DC

## 2017-08-02 NOTE — Assessment & Plan Note (Signed)
S: Poorly controlled on carvedilol 12.5mg  BID, diltiazem 180mg  XL BID, quinapril 40mg , chlorthalidone 25 mg.  Home numbers previously under 276 systolic are now averaging 141-142 BP Readings from Last 3 Encounters:  08/02/17 (!) 148/68  06/15/17 140/80  06/08/17 (!) 146/70  A/P: We discussed blood pressure goal of <140/90. Continue all medications other than diltiazem which we will stop-change to amlodipine 5 mg and follow-up in 2 to 4 weeks.

## 2017-08-02 NOTE — Assessment & Plan Note (Signed)
Update GFR today.  Avoid NSAIDs-could consider prednisone for back but would raise blood sugars.

## 2017-08-02 NOTE — Progress Notes (Signed)
Subjective:  Eddie Ferguson is a 77 y.o. year old very pleasant male patient who presents for/with See problem oriented charting ROS-No saddle anesthesia, bladder incontinence, fecal incontinence, weakness in extremity, numbness or tingling in extremity. History negative for trauma, fever, chills, unintentional weight loss, recent bacterial infection, recent IV drug use, HIV, pain worse at night or while supine. Does have history of cancer.   Past Medical History-  Patient Active Problem List   Diagnosis Date Noted  . Mild cognitive impairment 06/16/2015    Priority: High  . Prostate cancer ([place]) 05/28/2014    Priority: High  . Diabetes mellitus type 2, controlled ([place]) 04/23/2014    Priority: High  . CKD (chronic kidney disease), stage III ([place]) 04/18/2017    Priority: Medium  . Gout 07/09/2015    Priority: Medium  . Essential hypertension 04/23/2014    Priority: Medium  . Hyperlipidemia 04/23/2014    Priority: Medium  . Hypothyroidism 04/23/2014    Priority: Medium  . Stomach ulcer     Priority: Medium  . Adenomatous colon polyp 05/28/2014    Priority: Low  . Osteoarthritis 05/28/2014    Priority: Low  . Erectile dysfunction 05/28/2014    Priority: Low  . Basal cell carcinoma 05/28/2014    Priority: Low  . GERD (gastroesophageal reflux disease) 04/23/2014    Priority: Low  . Allergic rhinitis 04/23/2014    Priority: Low  . Glaucoma 04/23/2014    Priority: Low    Medications- reviewed and updated Current Outpatient Medications  Medication Sig Dispense Refill  . acetaminophen (TYLENOL) 500 MG tablet Take 500 mg by mouth every 6 (six) hours as needed. Reported on 06/16/2015    . allopurinol (ZYLOPRIM) 100 MG tablet Take 1 tablet (100 mg total) by mouth 2 (two) times daily. 180 tablet 1  . carvedilol (COREG) 25 MG tablet Take 0.5 tablets (12.5 mg total) by mouth 2 (two) times daily with a meal. 180 tablet 3  . chlorthalidone (HYGROTON) 25 MG tablet Take 0.5-1 tablets  (12.5-25 mg total) by mouth daily. 90 tablet 2  . Cholecalciferol (VITAMIN D) 2000 UNITS CAPS Take by mouth daily.      Marland Kitchen donepezil (ARICEPT) 10 MG tablet Take 1/2 tablet daily for a month, then increase to 1 tablet daily and continue 30 tablet 11  . fexofenadine (ALLEGRA) 180 MG tablet Take 180 mg by mouth.    . fluticasone (FLONASE) 50 MCG/ACT nasal spray Place 2 sprays into both nostrils daily. 48 g 3  . glucose blood (FREESTYLE LITE) test strip Use to check blood sugars daily. Dx: E11.9 100 each 12  . latanoprost (XALATAN) 0.005 % ophthalmic solution Place 1 drop into both eyes at bedtime.     Marland Kitchen levothyroxine (SYNTHROID) 50 MCG tablet Take 1 tablet (50 mcg total) by mouth daily. 90 tablet 3  . methocarbamol (ROBAXIN) 750 MG tablet Take 1 tablet (750 mg total) by mouth every 8 (eight) hours as needed for muscle spasms. 60 tablet 3  . Multiple Vitamin (MULTIVITAMIN) tablet Take 1 tablet by mouth daily.      . Omega-3 Fatty Acids (FISH OIL) 1200 MG CAPS Take by mouth daily.      Marland Kitchen omeprazole (PRILOSEC) 20 MG capsule Take 20 mg by mouth daily.    . potassium chloride SA (KLOR-CON M20) 20 MEQ tablet Take 2 tablets (40 mEq total) by mouth 2 (two) times daily. 360 tablet 1  . pravastatin (PRAVACHOL) 40 MG tablet Take 1 tablet (40 mg total)  by mouth daily. 90 tablet 3  . quinapril (ACCUPRIL) 40 MG tablet Take 1 tablet (40 mg total) by mouth daily. 90 tablet 3  . amLODipine (NORVASC) 5 MG tablet Take 1 tablet (5 mg total) by mouth daily. 30 tablet 5   No current facility-administered medications for this visit.     Objective: BP (!) 148/68   Pulse (!) 51   Temp 97.8 F (36.6 C) (Oral)   Ht 5\' 8"  (1.727 m)   Wt 208 lb (94.3 kg)   SpO2 95%   BMI 31.63 kg/m  Gen: NAD, resting comfortably CV: RRR no murmurs rubs or gallops Lungs: CTAB no crackles, wheeze, rhonchi Abdomen: soft/nontender/nondistended/normal bowel sounds. No rebound or guarding.  Ext: no edema Skin: warm, dry  Back - Normal  skin, Spine with normal alignment and no deformity.  No tenderness to vertebral process palpation.  Paraspinous muscles are tender and with spasm on right side of mid back.   Range of motion is full at neck and lumbar sacral regions. Negative Straight leg raise.  Neuro- no saddle anesthesia, 5/5 strength lower extremities. Some memory loss  Assessment/Plan:  Thoracic back Pain S: We saw patient about 6 weeks ago for right thoracic back pain.  We thought this was a strain of his mid back.  He was to use heat and Tylenol and follow-up if not improved within 2 weeks since he was improving some at that time.  We had discussed thoracic films and labs if he fails to improve.  Does have history of prostate cancer but PSA has trended down. He has appointment this afternoon and will find out most recent PSA- wife believes it was trending up Lab Results  Component Value Date   PSA 0.11 01/06/2017   PSA 0.22 05/13/2016   PSA 0.73 11/11/2015   Pain since 6 weeks- has not really got better or worse. Yesterday was about 3-4/10. Sometimes will worsen up a bit. 75% of the time not enough to need medicine. Tylenol gives some mild relief. Has noted some pain onto left side at times.   Has some old robaxin that he has tried sparingly- has not had worsening confusion on this (could try baclofen if need be) A/P: We will get thoracic spine films today.  He does have a history of prostate cancer- want to evaluate for any bony metastasis as apparently his PSA is trending up.  We will also update blood work including CBC, CMP and TSH.  Diabetes mellitus type 2, controlled ([place]) Has been diet controlled.  Update A1c today-target of 7 or less Lab Results  Component Value Date   HGBA1C 6.7 (H) 04/18/2017   HGBA1C 6.6 (H) 09/28/2016   HGBA1C 6.2 03/18/2016      CKD (chronic kidney disease), stage III (HCC) Update GFR today.  Avoid NSAIDs-could consider prednisone for back but would raise blood sugars.  Essential  hypertension S: Poorly controlled on carvedilol 12.5mg  BID, diltiazem 180mg  XL BID, quinapril 40mg , chlorthalidone 25 mg.  Home numbers previously under 789 systolic are now averaging 141-142 BP Readings from Last 3 Encounters:  08/02/17 (!) 148/68  06/15/17 140/80  06/08/17 (!) 146/70  A/P: We discussed blood pressure goal of <140/90. Continue all medications other than diltiazem which we will stop-change to amlodipine 5 mg and follow-up in 2 to 4 weeks.    Hyperlipidemia Continue pravastatin 40 mg with LDL goal at least under 100.  Update lipid panel today  Future Appointments  Date Time Provider [place]  09/26/2017 10:00 AM Cameron Sprang, MD [facility] None   Lab/Order associations:     Did have unsweetened tea this morning but otherwise fasting Acute left-sided thoracic back pain - Plan: DG Thoracic Spine W/[place]  Controlled type 2 diabetes mellitus with diabetic nephropathy, without long-term current use of insulin (H[facility]) - Plan: CBC, Comprehensive metabolic panel, Hemoglobin A1c, Lipid panel  CKD (chronic kidney disease), stage III (H[facility]) - Plan: Comprehensive metabolic panel  Hypothyroidism, unspecified type - Plan: TSH  Meds ordered this encounter  Medications  . amLODipine (NORVASC) 5 MG tablet    Sig: Take 1 tablet (5 mg total) by mouth daily.    Dispense:  30 tablet    Refill:  5    Return precautions advised.  Garret Reddish, MD

## 2017-08-02 NOTE — Assessment & Plan Note (Signed)
If PSA is in fact trending up-patient is not sure if you would want to undergo further prostate cancer treatments especially in light of his dementia.  We discussed if that is what he decides that we could get a palliative care consult on an outpatient basis

## 2017-08-02 NOTE — Progress Notes (Signed)
Your CBC was normal (blood counts, infection fighting cells, platelets). Your CMET was stable (kidney, liver, and electrolytes, blood sugar) with slightly low potassium, diabetes range blood sugar, moderate but stable loss of kidney function.  Are you taking your potassium twice a day still? Your cholesterol is reasonable Your A1c is at goal under 7 at 6.4. Your thyroid was normal

## 2017-08-02 NOTE — Patient Instructions (Addendum)
Please stop by lab before you go  If films and labs are ok- we could either start with sports medicine or physical therapy- think over which you would like and let our nurses know when they call with results  Stop diltiazem. Start amlodipine 5mg . Follow up in 2-4 weeks for recheck. Keep bringing your excellent blood pressure logs

## 2017-08-02 NOTE — Assessment & Plan Note (Signed)
Has been diet controlled.  Update A1c today-target of 7 or less Lab Results  Component Value Date   HGBA1C 6.7 (H) 04/18/2017   HGBA1C 6.6 (H) 09/28/2016   HGBA1C 6.2 03/18/2016

## 2017-08-02 NOTE — Assessment & Plan Note (Signed)
Continue pravastatin 40 mg with LDL goal at least under 100.  Update lipid panel today

## 2017-08-07 ENCOUNTER — Encounter: Payer: Self-pay | Admitting: Family Medicine

## 2017-09-26 ENCOUNTER — Encounter: Payer: Self-pay | Admitting: Neurology

## 2017-09-26 ENCOUNTER — Ambulatory Visit: Payer: Medicare HMO | Admitting: Neurology

## 2017-09-26 VITALS — BP 122/58 | HR 69 | Wt 209.0 lb

## 2017-09-26 DIAGNOSIS — R69 Illness, unspecified: Secondary | ICD-10-CM | POA: Diagnosis not present

## 2017-09-26 DIAGNOSIS — F0390 Unspecified dementia without behavioral disturbance: Secondary | ICD-10-CM | POA: Diagnosis not present

## 2017-09-26 DIAGNOSIS — F03A0 Unspecified dementia, mild, without behavioral disturbance, psychotic disturbance, mood disturbance, and anxiety: Secondary | ICD-10-CM | POA: Insufficient documentation

## 2017-09-26 MED ORDER — DONEPEZIL HCL 10 MG PO TABS: 10.0000 mg | ORAL_TABLET | Freq: Every day | ORAL | 3 refills | Status: DC

## 2017-09-26 NOTE — Patient Instructions (Signed)
1. Continue Donepezil 10mg  daily 2. Recommend doing a Driving Evaluation Please contact [employer] in [place] or [employer] [phone].  3. Follow-up in 6 months, call for any changes  FALL PRECAUTIONS: Be cautious when walking. Scan the area for obstacles that may increase the risk of trips and falls. When getting up in the mornings, sit up at the edge of the bed for a few minutes before getting out of bed. Consider elevating the bed at the head end to avoid drop of blood pressure when getting up. Walk always in a well-lit room (use night lights in the walls). Avoid area rugs or power cords from appliances in the middle of the walkways. Use a walker or a cane if necessary and consider physical therapy for balance exercise. Get your eyesight checked regularly.  FINANCIAL OVERSIGHT: Supervision, especially oversight when making financial decisions or transactions is also recommended.  HOME SAFETY: Consider the safety of the kitchen when operating appliances like stoves, microwave oven, and blender. Consider having supervision and share cooking responsibilities until no longer able to participate in those. Accidents with firearms and other hazards in the house should be identified and addressed as well.  DRIVING: Regarding driving, in patients with progressive memory problems, driving will be impaired. We advise to have someone else do the driving if trouble finding directions or if minor accidents are reported. Independent driving assessment is available to determine safety of driving.  ABILITY TO BE LEFT ALONE: If patient is unable to contact 911 operator, consider using [employer], or when the need is there, arrange for someone to stay with patients. Smoking is a fire hazard, consider supervision or cessation. Risk of wandering should be assessed by caregiver and if detected at any point, supervision and safe proof recommendations should be  instituted.  MEDICATION SUPERVISION: Inability to self-administer medication needs to be constantly addressed. Implement a mechanism to ensure safe administration of the medications.  RECOMMENDATIONS FOR ALL PATIENTS WITH MEMORY PROBLEMS: 1. Continue to exercise (Recommend 30 minutes of walking everyday, or 3 hours every week) 2. Increase social interactions - continue going to [place] and enjoy social gatherings with friends and family 3. Eat healthy, avoid fried foods and eat more fruits and vegetables 4. Maintain adequate blood pressure, blood sugar, and blood cholesterol level. Reducing the risk of stroke and cardiovascular disease also helps promoting better memory. 5. Avoid stressful situations. Live a simple life and avoid aggravations. Organize your time and prepare for the next day in anticipation. 6. Sleep well, avoid any interruptions of sleep and avoid any distractions in the bedroom that may interfere with adequate sleep quality 7. Avoid sugar, avoid sweets as there is a strong link between excessive sugar intake, diabetes, and cognitive impairment We discussed the Mediterranean diet, which has been shown to help patients reduce the risk of progressive memory disorders and reduces cardiovascular risk. This includes eating fish, eat fruits and green leafy vegetables, nuts like almonds and hazelnuts, walnuts, and also use olive oil. Avoid fast foods and fried foods as much as possible. Avoid sweets and sugar as sugar use has been linked to worsening of memory function.  There is always a concern of gradual progression of memory problems. If this is the case, then we may need to adjust level of care according to patient needs. Support, both to the patient and caregiver, should then be put into place.

## 2017-09-26 NOTE — Progress Notes (Signed)
NEUROLOGY FOLLOW UP OFFICE NOTE  Eddie Ferguson. Querry [id number] 01-26-1941  HISTORY OF PRESENT ILLNESS: I had the pleasure of seeing Eddie Ferguson in follow-up in the neurology clinic on 09/26/2017.  The patient was last seen 6 months ago for mild dementia. He is again accompanied by his wife who helps supplement the history today. MOCA score in January 2019 was 24/30. 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 He was started on Donepezil 10mg  daily, which he is tolerating without side effects. He feels his memory continues to deteriorate, he knows where he wants to go but has trouble remembering how to get places. He is using his GPS more. His wife agrees to the same issues of difficulties getting acclimated to where they are going, he cannot figure out how to get there. She is always in the car with him. She notices he is pretty sharp in the morning and seems to decline in the afternoon. Sleep is good, no apneic episodes noted. He denies missing any medications. All bills are on autopay. He denies any headaches, dizziness, vision changes, focal numbness/tingling/weakness, no falls. No significant personality changes, hallucinations, or paranoia.   History on Initial Assessment 03/27/2017: This is a very pleasant 77 year old right-handed man with a history of hypertension, hyperlipidemia, diabetes, hypothyroidism, presenting for evaluation of short-term memory loss. He and his wife report changes started around a year ago. He would not be able to recall where he ate the day prior. His wife reports he repeats himself and asks the same questions several times. He used to be accurate and precise, and has a very detailed spreadsheet of his medications (has similar one for bills), but now has difficulty using his computer. He is  better in the morning, but later in the afternoon, he cannot recall how he set up a worksheet or where it is in his files. He makes wrong turns while driving, but is able to get back on track easily. He occasionally misplaces things at home. His wife denies any personality changes, but he gets frustrated with himself because he is usually so precise. He has problems with multitasking. No paranoia or hallucinations. He is independent with ADLs. MMSE at his PCP office in November 2018 was 28/30. 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 His paternal grandmother had dementia. No history of significant head injuries. He drinks beer on occasion.   He has numbness in both feet. He has a left knee brace from a torn tendon. He denies any headaches, dizziness, diplopia, dysarthria/dypshagia, neck/back pain, focal numbness/tingling/weakness, bowel/bladder dysfunction, anosmia, or tremors.   PAST MEDICAL HISTORY: Past Medical History:  Diagnosis Date  . Allergy   . Ankle injury 06/01/2001   no surgery- skin debridement   . Arthritis   . Basal cell carcinoma 05/28/2014   L nasal tip. Mohs Dr. Link Snuffer.    . Blood transfusion    2010 because of stomach ulcers  . Cataract    bilaterally removed  . Diabetes mellitus   . GERD (gastroesophageal reflux disease)   . Glaucoma   . Hyperlipidemia   . Hypertension   . Hypothyroidism   . Pneumonia 04/12/2006  . Prostate cancer ([place])   . Seasonal allergies   . Sinusitis    treated for bacterial infection  at least once a year at [facility]  . Stomach ulcer 2010   bleeding ulcer result NSAIDS    MEDICATIONS: Current Outpatient Medications on File Prior to Visit  Medication Sig Dispense Refill  . acetaminophen (TYLENOL) 500 MG tablet Take 500 mg by mouth every 6 (six) hours as needed. Reported on 06/16/2015    . allopurinol (ZYLOPRIM) 100 MG tablet Take 1 tablet (100 mg total) by mouth 2 (two)  times daily. 180 tablet 1  . amLODipine (NORVASC) 5 MG tablet Take 1 tablet (5 mg total) by mouth daily. 30 tablet 5  . carvedilol (COREG) 25 MG tablet Take 0.5 tablets (12.5 mg total) by mouth 2 (two) times daily with a meal. 180 tablet 3  . chlorthalidone (HYGROTON) 25 MG tablet Take 0.5-1 tablets (12.5-25 mg total) by mouth daily. 90 tablet 2  . Cholecalciferol (VITAMIN D) 2000 UNITS CAPS Take by mouth daily.      Marland Kitchen donepezil (ARICEPT) 10 MG tablet Take 1/2 tablet daily for a month, then increase to 1 tablet daily and continue 30 tablet 11  . fexofenadine (ALLEGRA) 180 MG tablet Take 180 mg by mouth.    . fluticasone (FLONASE) 50 MCG/ACT nasal spray Place 2 sprays into both nostrils daily. 48 g 3  . glucose blood (FREESTYLE LITE) test strip Use to check blood sugars daily. Dx: E11.9 100 each 12  . latanoprost (XALATAN) 0.005 % ophthalmic solution Place 1 drop into both eyes at bedtime.     Marland Kitchen levothyroxine (SYNTHROID) 50 MCG tablet Take 1 tablet (50 mcg total) by mouth daily. 90 tablet 3  . methocarbamol (ROBAXIN) 750 MG tablet Take 1 tablet (750 mg total) by mouth every 8 (eight) hours as needed for muscle spasms. 60 tablet 3  . Multiple Vitamin (MULTIVITAMIN) tablet Take 1 tablet by mouth daily.      . Omega-3 Fatty Acids (FISH OIL) 1200 MG CAPS Take by mouth daily.      Marland Kitchen omeprazole (PRILOSEC) 20 MG capsule Take 20 mg by mouth daily.    . potassium chloride SA (KLOR-CON M20) 20 MEQ tablet Take 2 tablets (40 mEq total) by mouth 2 (two) times daily. 360 tablet 1  . pravastatin (PRAVACHOL) 40 MG tablet Take 1 tablet (40 mg total) by mouth daily. 90 tablet 3  . quinapril (ACCUPRIL) 40 MG tablet Take 1 tablet (40 mg total) by mouth daily. 90 tablet 3   No current facility-administered medications on file prior to visit.     ALLERGIES: Allergies  Allergen Reactions  . Aspirin Other (See Comments)    Stomach ulcers  . Nsaids Other (See Comments)    Stomach ulcers  . Amoxicillin-Pot  Clavulanate Rash  . Sulfa Antibiotics Swelling and Rash    FAMILY HISTORY: Family History  Problem Relation Age of Onset  . Diabetes Mother   . Diabetes Father   . Hyperlipidemia Father   . Colon polyps Father   . Brain cancer Brother        smoker  . Dementia Paternal Grandmother   . Colon cancer Neg Hx   . Esophageal cancer Neg Hx   . Stomach cancer Neg Hx   . Rectal cancer Neg Hx     SOCIAL HISTORY: Social History   Socioeconomic History  . Marital status: Married    Spouse name: Not on file  . Number of children: Not on file  . Years of education: Not on file  . Highest education level: Not on file  Occupational History  .  Not on file  Social Needs  . Financial resource strain: Not on file  . Food insecurity:    Worry: Not on file    Inability: Not on file  . Transportation needs:    Medical: Not on file    Non-medical: Not on file  Tobacco Use  . Smoking status: Former Smoker    Last attempt to quit: 02/03/2001    Years since quitting: 16.6  . Smokeless tobacco: Never Used  . Tobacco comment: intermittent cigar use stopped  Substance and Sexual Activity  . Alcohol use: Yes    Alcohol/week: 0.0 oz    Comment: occasional beer  . Drug use: No  . Sexual activity: Not on file  Lifestyle  . Physical activity:    Days per week: Not on file    Minutes per session: Not on file  . Stress: Not on file  Relationships  . Social connections:    Talks on phone: Not on file    Gets together: Not on file    Attends religious service: Not on file    Active member of club or organization: Not on file    Attends meetings of clubs or organizations: Not on file    Relationship status: Not on file  . Intimate partner violence:    Fear of current or ex partner: Not on file    Emotionally abused: Not on file    Physically abused: Not on file    Forced sexual activity: Not on file  Other Topics Concern  . Not on file  Social History Narrative   Married (wife patient  outside practice). 2 children. 5 grandkids.       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       Hobbies: travel, enjoys going to shows, [facility] women's basketball tournament. Doesn't watch tv.             Lives in 1 story condo on the 3rd floor   Bachelor's degree    REVIEW OF SYSTEMS: Constitutional: No fevers, chills, or sweats, no generalized fatigue, change in appetite Eyes: No visual changes, double vision, eye pain Ear, nose and throat: No hearing loss, ear pain, nasal congestion, sore throat Cardiovascular: No chest pain, palpitations Respiratory:  No shortness of breath at rest or with exertion, wheezes GastrointestinaI: No nausea, vomiting, diarrhea, abdominal pain, fecal incontinence Genitourinary:  No dysuria, urinary retention or frequency Musculoskeletal:  No neck pain, back pain Integumentary: No rash, pruritus, skin lesions Neurological: as above Psychiatric: No depression, insomnia, anxiety Endocrine: No palpitations, fatigue, diaphoresis, mood swings, change in appetite, change in weight, increased thirst Hematologic/Lymphatic:  No anemia, purpura, petechiae. Allergic/Immunologic: no itchy/runny eyes, nasal congestion, recent allergic reactions, rashes  PHYSICAL EXAM: Vitals:   09/26/17 1013  BP: (!) 122/58  Pulse: 69  SpO2: 93%   General: No acute distress Head:  Normocephalic/atraumatic Neck: supple, no paraspinal tenderness, full range of motion Heart:  Regular rate and rhythm Lungs:  Clear to auscultation bilaterally Back: No paraspinal tenderness Skin/Extremities: No rash, no edema Neurological Exam: alert and oriented to person, place, and time. No aphasia or dysarthria. Fund of knowledge is appropriate.  Recent and remote memory are intact.  Attention and concentration are normal.    Able to name objects and repeat phrases.  Montreal Cognitive Assessment  09/26/2017 03/27/2017  Visuospatial/ Executive (0/5) 4 4  Naming (0/3) 2 3  Attention: Read list  of digits (0/2) 2 2  Attention: Read list of letters (0/1) 1 1  Attention: Serial 7 subtraction starting at 100 (0/3) 3 3  Language: Repeat phrase (0/2) 2 2  Language : Fluency (0/1) 1 1  Abstraction (0/2) 2 2  Delayed Recall (0/5) 0 0  Orientation (0/6) 6 6  Total 23 24   Cranial nerves: Pupils equal, round, reactive to light. Extraocular movements intact with no nystagmus. Visual fields full. Facial sensation intact. No facial asymmetry. Tongue, uvula, palate midline.  Motor: Bulk and tone normal, muscle strength 5/5 throughout with no pronator drift.  Sensation to light touch intact.  No extinction to double simultaneous stimulation.  Deep tendon reflexes 2+ throughout, toes downgoing.  Finger to nose testing intact.  Gait narrow-based and steady, able to tandem walk adequately.  Romberg negative.  IMPRESSION: This is a very pleasant 77 yo RH man with vascular risk factors including hypertension, hyperlipidemia, diabetes, presenting for worsening short-term memory loss. MRI brain no acute changes, there was mild chronic microvascular disease. MOCA score today 23/30 (24/30 in January 2019). He notes continued decline. He overall appears to manage complex tasks but has more difficulty with driving. Symptoms suggestive of mild dementia. We discussed expectations from Aricept, continue 10mg  daily dose, refills sent. They are interested in joining a research trial on dementia at [facility]. We again discussed driving concerns, recommend a driving evaluation with OT. We again also discussed the importance of control of vascular risk factors, physical exercise, and brain stimulation exercises for brain health. He will follow-up in 6 months and knows to call for any changes.   Thank you for allowing me to participate in his care.  Please do not hesitate to call for any questions or concerns.  The duration of this appointment visit was 30 minutes of face-to-face time with the patient.  Greater than 50% of  this time was spent in counseling, explanation of diagnosis, planning of further management, and coordination of care.   Ellouise Newer, M.D.   CC: Dr. Yong Channel

## 2017-11-21 ENCOUNTER — Ambulatory Visit: Payer: Medicare HMO | Admitting: Family Medicine

## 2017-11-24 ENCOUNTER — Encounter: Payer: Medicare HMO | Admitting: Family Medicine

## 2017-11-30 DIAGNOSIS — D1801 Hemangioma of skin and subcutaneous tissue: Secondary | ICD-10-CM | POA: Diagnosis not present

## 2017-11-30 DIAGNOSIS — D485 Neoplasm of uncertain behavior of skin: Secondary | ICD-10-CM | POA: Diagnosis not present

## 2017-11-30 DIAGNOSIS — L821 Other seborrheic keratosis: Secondary | ICD-10-CM | POA: Diagnosis not present

## 2017-11-30 DIAGNOSIS — D225 Melanocytic nevi of trunk: Secondary | ICD-10-CM | POA: Diagnosis not present

## 2017-11-30 DIAGNOSIS — Z85828 Personal history of other malignant neoplasm of skin: Secondary | ICD-10-CM | POA: Diagnosis not present

## 2017-11-30 DIAGNOSIS — L570 Actinic keratosis: Secondary | ICD-10-CM | POA: Diagnosis not present

## 2017-12-01 ENCOUNTER — Encounter: Payer: Self-pay | Admitting: Family Medicine

## 2017-12-01 ENCOUNTER — Ambulatory Visit (INDEPENDENT_AMBULATORY_CARE_PROVIDER_SITE_OTHER): Payer: Medicare HMO | Admitting: Family Medicine

## 2017-12-01 VITALS — BP 130/60 | HR 60 | Temp 98.0°F | Ht 68.0 in | Wt 208.4 lb

## 2017-12-01 DIAGNOSIS — Z23 Encounter for immunization: Secondary | ICD-10-CM

## 2017-12-01 DIAGNOSIS — E119 Type 2 diabetes mellitus without complications: Secondary | ICD-10-CM | POA: Diagnosis not present

## 2017-12-01 DIAGNOSIS — K219 Gastro-esophageal reflux disease without esophagitis: Secondary | ICD-10-CM

## 2017-12-01 DIAGNOSIS — I10 Essential (primary) hypertension: Secondary | ICD-10-CM | POA: Diagnosis not present

## 2017-12-01 DIAGNOSIS — E785 Hyperlipidemia, unspecified: Secondary | ICD-10-CM

## 2017-12-01 LAB — POCT GLYCOSYLATED HEMOGLOBIN (HGB A1C): HEMOGLOBIN A1C: 6.1 % — AB (ref 4.0–5.6)

## 2017-12-01 NOTE — Patient Instructions (Addendum)
Trial pepcid over the counter instead of omeprazole. The only problem with pepcid is it works better twice a day- perhaps try just once before dinner and go to twice a day if needed  As long as a1c is 7 or less- you are good to go. I want to come back in to chat if above this level.   6 month follow up

## 2017-12-01 NOTE — Assessment & Plan Note (Signed)
S: reasonably controlled on pravastatin 40mg  with LDL under 100. Also on fish oil.  Lab Results  Component Value Date   CHOL 147 08/02/2017   HDL 53.40 08/02/2017   LDLCALC 73 08/02/2017   LDLDIRECT 90.0 09/28/2016   TRIG 104.0 08/02/2017   CHOLHDL 3 08/02/2017   A/P: continue current rx

## 2017-12-01 NOTE — Assessment & Plan Note (Signed)
S:  omeprazole is controlling GERD A/P:Trial pepcid over the counter instead of omeprazole. The only problem with pepcid is it works better twice a day- perhaps try just once before dinner and go to twice a day if needed - prefer h2 blocker for memory loss as well as CKD III- hoping he can tolerate this

## 2017-12-01 NOTE — Addendum Note (Signed)
Addended by: Elmer Bales on: 12/01/2017 10:36 AM   Modules accepted: Orders

## 2017-12-01 NOTE — Assessment & Plan Note (Signed)
S: controlled on  coreg 12.5mg  BID,  quinapril 40mg , chlorthalidone 25mg --> with addition of amlodipine up to 5mg  from last visit (stopped diltiazem)- home #s look good  BP Readings from Last 3 Encounters:  12/01/17 130/60  09/26/17 (!) 122/58  08/02/17 (!) 148/68  A/P: We discussed blood pressure goal of <140/90. Continue current meds:  Amlodipine more helpful than diltiazem

## 2017-12-01 NOTE — Assessment & Plan Note (Signed)
S:  controlled on no rx Lab Results  Component Value Date   HGBA1C 6.4 08/02/2017   HGBA1C 6.7 (H) 04/18/2017   HGBA1C 6.6 (H) 09/28/2016   A/P: update a1c- continue current rx

## 2017-12-01 NOTE — Progress Notes (Signed)
Subjective:  Eddie Ferguson is a 77 y.o. year old very pleasant male patient who presents for/with See problem oriented charting ROS- No chest pain or shortness of breath. No headache or blurry vision. Some right back pain (different spot than before and only 3 weeks- prior pain resolved).    Past Medical History-  Patient Active Problem List   Diagnosis Date Noted  . Mild cognitive impairment 06/16/2015    Priority: High  . Prostate cancer ([place]) 05/28/2014    Priority: High  . Diabetes mellitus type 2, controlled ([place]) 04/23/2014    Priority: High  . CKD (chronic kidney disease), stage III ([place]) 04/18/2017    Priority: Medium  . Gout 07/09/2015    Priority: Medium  . Essential hypertension 04/23/2014    Priority: Medium  . Hyperlipidemia 04/23/2014    Priority: Medium  . Hypothyroidism 04/23/2014    Priority: Medium  . Stomach ulcer     Priority: Medium  . Adenomatous colon polyp 05/28/2014    Priority: Low  . Osteoarthritis 05/28/2014    Priority: Low  . Erectile dysfunction 05/28/2014    Priority: Low  . Basal cell carcinoma 05/28/2014    Priority: Low  . GERD (gastroesophageal reflux disease) 04/23/2014    Priority: Low  . Allergic rhinitis 04/23/2014    Priority: Low  . Glaucoma 04/23/2014    Priority: Low  . Mild dementia 09/26/2017  . History of nonmelanoma skin cancer 11/18/2012  . History of shingles 04/24/2011    Medications- reviewed and updated Current Outpatient Medications  Medication Sig Dispense Refill  . acetaminophen (TYLENOL) 500 MG tablet Take 500 mg by mouth every 6 (six) hours as needed. Reported on 06/16/2015    . allopurinol (ZYLOPRIM) 100 MG tablet Take 1 tablet (100 mg total) by mouth 2 (two) times daily. 180 tablet 1  . amLODipine (NORVASC) 5 MG tablet Take 1 tablet (5 mg total) by mouth daily. 30 tablet 5  . carvedilol (COREG) 25 MG tablet Take 0.5 tablets (12.5 mg total) by mouth 2 (two) times daily with a meal. 180 tablet 3  .  chlorthalidone (HYGROTON) 25 MG tablet Take 0.5-1 tablets (12.5-25 mg total) by mouth daily. 90 tablet 2  . Cholecalciferol (VITAMIN D) 2000 UNITS CAPS Take by mouth daily.      Marland Kitchen donepezil (ARICEPT) 10 MG tablet Take 1 tablet (10 mg total) by mouth daily. 90 tablet 3  . fexofenadine (ALLEGRA) 180 MG tablet Take 180 mg by mouth.    . fluticasone (FLONASE) 50 MCG/ACT nasal spray Place 2 sprays into both nostrils daily. 48 g 3  . glucose blood (FREESTYLE LITE) test strip Use to check blood sugars daily. Dx: E11.9 100 each 12  . latanoprost (XALATAN) 0.005 % ophthalmic solution Place 1 drop into both eyes at bedtime.     Marland Kitchen levothyroxine (SYNTHROID) 50 MCG tablet Take 1 tablet (50 mcg total) by mouth daily. 90 tablet 3  . methocarbamol (ROBAXIN) 750 MG tablet Take 1 tablet (750 mg total) by mouth every 8 (eight) hours as needed for muscle spasms. 60 tablet 3  . Multiple Vitamin (MULTIVITAMIN) tablet Take 1 tablet by mouth daily.      . Omega-3 Fatty Acids (FISH OIL) 1200 MG CAPS Take by mouth daily.      Marland Kitchen omeprazole (PRILOSEC) 20 MG capsule Take 20 mg by mouth daily.    . potassium chloride SA (KLOR-CON M20) 20 MEQ tablet Take 2 tablets (40 mEq total) by mouth 2 (two) times  daily. 360 tablet 1  . pravastatin (PRAVACHOL) 40 MG tablet Take 1 tablet (40 mg total) by mouth daily. 90 tablet 3  . quinapril (ACCUPRIL) 40 MG tablet Take 1 tablet (40 mg total) by mouth daily. 90 tablet 3   No current facility-administered medications for this visit.     Objective: BP 130/60 (BP Location: Left Arm, Patient Position: Sitting, Cuff Size: Normal)   Pulse 60   Temp 98 F (36.7 C) (Oral)   Ht 5\' 8"  (1.727 m)   Wt 208 lb 6.1 oz (94.5 kg)   SpO2 97%   BMI 31.68 kg/m  Gen: NAD, resting comfortably CV: RRR  Lungs: nonlabored, normal respiratory rate Abdomen: soft/nondistended Msk: some pain with palpation 10 cm to right of thoracic spine. Some tightness in musculature Ext: no edema Skin: warm,  dry Neuro: grossly normal, moves all extremities  Diabetic Foot Exam - Simple   Simple Foot Form Diabetic Foot exam was performed with the following findings:  Yes 12/01/2017 10:27 AM  Visual Inspection See comments:  Yes Sensation Testing Intact to touch and monofilament testing bilaterally:  Yes Pulse Check Posterior Tibialis and Dorsalis pulse intact bilaterally:  Yes Comments Callous below MTP 5th toe on right foot    Assessment/Plan:  Other notes: 1. IMOVE dementia study at [facility] for his dementia. Seeing Dr. Delice Lesch and on aricept 10mg .  2. cbd oil on joints. Pain to right of mid back  Is better. Now some more lateral pain- lidocaine patch didn't help a ton. About 3 weeks of pain. He will monitor and if doesn't improve return to care. Advised trial massage 3. Potassium was better at [facility] study he states- we will skip this today. On 2 tablets twice a day (80 meq per day)  Hypothyroidism S: On thyroid medication-  Levothyroxine 50 mcg Lab Results  Component Value Date   TSH 2.74 08/02/2017   A/P: continue current rx  Diabetes mellitus type 2, controlled ([place]) S:  controlled on no rx Lab Results  Component Value Date   HGBA1C 6.4 08/02/2017   HGBA1C 6.7 (H) 04/18/2017   HGBA1C 6.6 (H) 09/28/2016   A/P: update a1c- continue current rx  Essential hypertension S: controlled on  coreg 12.5mg  BID,  quinapril 40mg , chlorthalidone 25mg --> with addition of amlodipine up to 5mg  from last visit (stopped diltiazem)- home #s look good  BP Readings from Last 3 Encounters:  12/01/17 130/60  09/26/17 (!) 122/58  08/02/17 (!) 148/68  A/P: We discussed blood pressure goal of <140/90. Continue current meds:  Amlodipine more helpful than diltiazem  Hyperlipidemia S: reasonably controlled on pravastatin 40mg  with LDL under 100. Also on fish oil.  Lab Results  Component Value Date   CHOL 147 08/02/2017   HDL 53.40 08/02/2017   LDLCALC 73 08/02/2017   LDLDIRECT 90.0  09/28/2016   TRIG 104.0 08/02/2017   CHOLHDL 3 08/02/2017   A/P: continue current rx  GERD (gastroesophageal reflux disease) S:  omeprazole is controlling GERD A/P:Trial pepcid over the counter instead of omeprazole. The only problem with pepcid is it works better twice a day- perhaps try just once before dinner and go to twice a day if needed - prefer h2 blocker for memory loss as well as CKD III- hoping he can tolerate this   Future Appointments  Date Time Provider [place]  05/10/2018  8:30 AM Cameron Sprang, MD [facility] None   6 months planned  Lab/Order associations: Need for prophylactic vaccination and inoculation  against influenza - Plan: Flu vaccine HIGH DOSE PF  Controlled type 2 diabetes mellitus without complication, without long-term current use of insulin (H[facility])  Essential hypertension  Hyperlipidemia, unspecified hyperlipidemia type  Gastroesophageal reflux disease without esophagitis  Return precautions advised.  Garret Reddish, MD

## 2017-12-04 ENCOUNTER — Other Ambulatory Visit: Payer: Self-pay | Admitting: Family Medicine

## 2017-12-22 ENCOUNTER — Encounter: Payer: Self-pay | Admitting: Family Medicine

## 2017-12-22 ENCOUNTER — Ambulatory Visit (INDEPENDENT_AMBULATORY_CARE_PROVIDER_SITE_OTHER): Payer: Medicare HMO | Admitting: Family Medicine

## 2017-12-22 VITALS — BP 130/58 | HR 71 | Temp 98.7°F | Ht 68.0 in | Wt 208.2 lb

## 2017-12-22 DIAGNOSIS — R1011 Right upper quadrant pain: Secondary | ICD-10-CM | POA: Diagnosis not present

## 2017-12-22 DIAGNOSIS — R109 Unspecified abdominal pain: Secondary | ICD-10-CM

## 2017-12-22 NOTE — Patient Instructions (Addendum)
Please stop by lab before you go  We will call you within two weeks about your referral for CT scan . If you do not hear within 2 weeks, give [place] a call.   If you were to have significant worsening of symptoms- please seek care before CT scan

## 2017-12-22 NOTE — Progress Notes (Signed)
Subjective:  Eddie Ferguson is a 77 y.o. year old very pleasant male patient who presents for/with See problem oriented charting ROS-complains of right side pain, loose stools.  No nausea vomiting.  Has felt fevers for a few days in the afternoons.  Denies chest pain.  Denies shortness of breath.  Past Medical History-  Patient Active Problem List   Diagnosis Date Noted  . Mild cognitive impairment 06/16/2015    Priority: High  . Prostate cancer ([place]) 05/28/2014    Priority: High  . Diabetes mellitus type 2, controlled ([place]) 04/23/2014    Priority: High  . CKD (chronic kidney disease), stage III ([place]) 04/18/2017    Priority: Medium  . Gout 07/09/2015    Priority: Medium  . Essential hypertension 04/23/2014    Priority: Medium  . Hyperlipidemia 04/23/2014    Priority: Medium  . Hypothyroidism 04/23/2014    Priority: Medium  . Stomach ulcer     Priority: Medium  . Adenomatous colon polyp 05/28/2014    Priority: Low  . Osteoarthritis 05/28/2014    Priority: Low  . Erectile dysfunction 05/28/2014    Priority: Low  . Basal cell carcinoma 05/28/2014    Priority: Low  . GERD (gastroesophageal reflux disease) 04/23/2014    Priority: Low  . Allergic rhinitis 04/23/2014    Priority: Low  . Glaucoma 04/23/2014    Priority: Low  . Mild dementia ([place]) 09/26/2017  . History of nonmelanoma skin cancer 11/18/2012  . History of shingles 04/24/2011    Medications- reviewed and updated Current Outpatient Medications  Medication Sig Dispense Refill  . acetaminophen (TYLENOL) 500 MG tablet Take 500 mg by mouth every 6 (six) hours as needed. Reported on 06/16/2015    . allopurinol (ZYLOPRIM) 100 MG tablet Take 1 tablet (100 mg total) by mouth 2 (two) times daily. 180 tablet 1  . amLODipine (NORVASC) 5 MG tablet Take 1 tablet (5 mg total) by mouth daily. 30 tablet 5  . carvedilol (COREG) 25 MG tablet Take 0.5 tablets (12.5 mg total) by mouth 2 (two) times daily with a meal. 180 tablet 3   . chlorthalidone (HYGROTON) 25 MG tablet Take 0.5-1 tablets (12.5-25 mg total) by mouth daily. 90 tablet 2  . Cholecalciferol (VITAMIN D) 2000 UNITS CAPS Take by mouth daily.      Marland Kitchen donepezil (ARICEPT) 10 MG tablet Take 1 tablet (10 mg total) by mouth daily. 90 tablet 3  . fexofenadine (ALLEGRA) 180 MG tablet Take 180 mg by mouth.    . fluticasone (FLONASE) 50 MCG/ACT nasal spray Place 2 sprays into both nostrils daily. 48 g 3  . glucose blood (FREESTYLE LITE) test strip Use to check blood sugars daily. Dx: E11.9 100 each 12  . KLOR-CON M20 20 MEQ tablet TAKE 2 TABLETS BY MOUTH 2 TIMES DAILY. 360 tablet 1  . latanoprost (XALATAN) 0.005 % ophthalmic solution Place 1 drop into both eyes at bedtime.     Marland Kitchen levothyroxine (SYNTHROID) 50 MCG tablet Take 1 tablet (50 mcg total) by mouth daily. 90 tablet 3  . methocarbamol (ROBAXIN) 750 MG tablet Take 1 tablet (750 mg total) by mouth every 8 (eight) hours as needed for muscle spasms. 60 tablet 3  . Multiple Vitamin (MULTIVITAMIN) tablet Take 1 tablet by mouth daily.      . Omega-3 Fatty Acids (FISH OIL) 1200 MG CAPS Take by mouth daily.      Marland Kitchen omeprazole (PRILOSEC) 20 MG capsule Take 20 mg by mouth daily.    Marland Kitchen  pravastatin (PRAVACHOL) 40 MG tablet Take 1 tablet (40 mg total) by mouth daily. 90 tablet 3  . quinapril (ACCUPRIL) 40 MG tablet Take 1 tablet (40 mg total) by mouth daily. 90 tablet 3   No current facility-administered medications for this visit.     Objective: BP (!) 130/58 (BP Location: Left Arm, Patient Position: Sitting, Cuff Size: Large)   Pulse 71   Temp 98.7 F (37.1 C) (Oral)   Ht 5\' 8"  (1.727 m)   Wt 208 lb 3.2 oz (94.4 kg)   SpO2 96%   BMI 31.66 kg/m  Gen: NAD, resting comfortably CV: RRR no murmurs rubs or gallops Lungs: CTAB no crackles, wheeze, rhonchi Abdomen: soft/nontender/nondistended/normal bowel sounds. No rebound or guarding.  I am unable to reproduce his pain on exam today Ext: no edema Skin: warm,  dry  Assessment/Plan:  Right thoracic back pain/right side pain/Right upper quadrant pain S:  Patient seen for right thoracic back pain back in April- conservative care with heat, tylenol was tried. We saw him back about 6 weeks later and no significant improveent. We did thoracic spine films that day due to history of prostate cancer to rule out bony metastasis. We also updated cbc, cmp, tsh at that time. Thoracic films were largely normal. Labs were reassuring/stable. Discussed follow up if not improving- also mentioned PT or sports medicine referral options- pain had improved some and he held off then about 3 months later saw me back and was having reoccuring pain but improved in same area.   Had tried cbd oil for joints- no rub on back- that was helping some. Lidocaine patch didn't help. We opted for follow up if not improving.   Pain now less int he back though can radiate into back as well as into RUQ. Pain primarily in mid axillary line on right and further into the back. Pain is really irregular. May be gone in AM but when he gets up and moves it seems to flare up- doesn't hurt too bad or for too long but has just been long term nagging reoccurring issues. In the last week or so has noted subjective fevers in the afternoons and feels lack of energy. Also very mild headache.    Loose stools for several months-denies constipation A/P:Given chronicity of pain now at 10 months and inability to reproduce on exam but with ongoing recurrence- I told patient I though we needed imaging - Since more in adbominal area opted for ct abd/pelvis with contrast to evaluate gallbladder, liver, vascularity. If this exam is reassuring could refer to sports medicine for their opinion. I feel less strongly about lung imaging- other than bases that will likely be obtained on CT. Update labs as well today- CKD III so contrast will need to be adjusted  Future Appointments  Date Time Provider [place]   02/16/2018  7:45 AM Gardiner Barefoot, DPM [facility]  05/10/2018  8:30 AM Cameron Sprang, MD [facility] None  05/29/2018  9:20 AM Marin Olp, MD [facility]   Lab/Order associations: Right sided abdominal pain - Plan: CBC with Differential/Platelet, Comprehensive metabolic panel, Comprehensive metabolic panel, CBC with Differential/Platelet, CANCELED: Comprehensive metabolic panel, CANCELED: CBC with Differential/Platelet  Right upper quadrant pain - Plan: CT Abdomen Pelvis W Contrast, CBC with Differential/Platelet, Comprehensive metabolic panel, Comprehensive metabolic panel, CBC with Differential/Platelet, CANCELED: Comprehensive metabolic panel, CANCELED: CBC with Differential/Platelet  Return precautions advised.  Garret Reddish, MD

## 2017-12-23 LAB — CBC WITH DIFFERENTIAL/PLATELET
BASOS PCT: 1 %
Basophils Absolute: 102 cells/uL (ref 0–200)
EOS ABS: 418 {cells}/uL (ref 15–500)
Eosinophils Relative: 4.1 %
HCT: 41 % (ref 38.5–50.0)
Hemoglobin: 14.1 g/dL (ref 13.2–17.1)
Lymphs Abs: 1387 cells/uL (ref 850–3900)
MCH: 30.9 pg (ref 27.0–33.0)
MCHC: 34.4 g/dL (ref 32.0–36.0)
MCV: 89.9 fL (ref 80.0–100.0)
MONOS PCT: 8.6 %
MPV: 11.1 fL (ref 7.5–12.5)
Neutro Abs: 7415 cells/uL (ref 1500–7800)
Neutrophils Relative %: 72.7 %
PLATELETS: 218 10*3/uL (ref 140–400)
RBC: 4.56 10*6/uL (ref 4.20–5.80)
RDW: 13.1 % (ref 11.0–15.0)
TOTAL LYMPHOCYTE: 13.6 %
WBC mixed population: 877 cells/uL (ref 200–950)
WBC: 10.2 10*3/uL (ref 3.8–10.8)

## 2017-12-23 LAB — COMPREHENSIVE METABOLIC PANEL
AG RATIO: 2.1 (calc) (ref 1.0–2.5)
ALT: 21 U/L (ref 9–46)
AST: 20 U/L (ref 10–35)
Albumin: 4.2 g/dL (ref 3.6–5.1)
Alkaline phosphatase (APISO): 56 U/L (ref 40–115)
BUN / CREAT RATIO: 10 (calc) (ref 6–22)
BUN: 14 mg/dL (ref 7–25)
CO2: 30 mmol/L (ref 20–32)
Calcium: 9.6 mg/dL (ref 8.6–10.3)
Chloride: 98 mmol/L (ref 98–110)
Creat: 1.45 mg/dL — ABNORMAL HIGH (ref 0.70–1.18)
GLOBULIN: 2 g/dL (ref 1.9–3.7)
GLUCOSE: 170 mg/dL — AB (ref 65–99)
Potassium: 3.6 mmol/L (ref 3.5–5.3)
SODIUM: 140 mmol/L (ref 135–146)
TOTAL PROTEIN: 6.2 g/dL (ref 6.1–8.1)
Total Bilirubin: 0.5 mg/dL (ref 0.2–1.2)

## 2018-01-03 DIAGNOSIS — Z8546 Personal history of malignant neoplasm of prostate: Secondary | ICD-10-CM | POA: Diagnosis not present

## 2018-01-03 LAB — PSA: PSA: 0.12

## 2018-01-05 ENCOUNTER — Encounter: Payer: Self-pay | Admitting: Family Medicine

## 2018-01-05 ENCOUNTER — Ambulatory Visit (INDEPENDENT_AMBULATORY_CARE_PROVIDER_SITE_OTHER)
Admission: RE | Admit: 2018-01-05 | Discharge: 2018-01-05 | Disposition: A | Discharge status: Home / Self Care | Payer: Medicare HMO | Source: Ambulatory Visit | Attending: Family Medicine | Admitting: Family Medicine

## 2018-01-05 DIAGNOSIS — K769 Liver disease, unspecified: Secondary | ICD-10-CM | POA: Diagnosis not present

## 2018-01-05 DIAGNOSIS — R1011 Right upper quadrant pain: Secondary | ICD-10-CM | POA: Diagnosis not present

## 2018-01-05 DIAGNOSIS — I700 Atherosclerosis of aorta: Secondary | ICD-10-CM | POA: Insufficient documentation

## 2018-01-05 MED ORDER — IOPAMIDOL (ISOVUE-300) INJECTION 61%
100.0000 mL | Freq: Once | INTRAVENOUS | Status: AC | PRN
  Administered 2018-01-05: 80 mL via INTRAVENOUS

## 2018-01-09 DIAGNOSIS — Z8546 Personal history of malignant neoplasm of prostate: Secondary | ICD-10-CM | POA: Diagnosis not present

## 2018-01-11 ENCOUNTER — Encounter: Payer: Self-pay | Admitting: Family Medicine

## 2018-01-11 ENCOUNTER — Other Ambulatory Visit: Payer: Self-pay | Admitting: Family Medicine

## 2018-01-16 ENCOUNTER — Other Ambulatory Visit: Payer: Self-pay

## 2018-01-16 MED ORDER — FLUTICASONE PROPIONATE 50 MCG/ACT NA SUSP: 2.0000 | Freq: Every day | NASAL | 3 refills | Status: DC

## 2018-01-22 DIAGNOSIS — H5213 Myopia, bilateral: Secondary | ICD-10-CM | POA: Diagnosis not present

## 2018-01-22 DIAGNOSIS — H52223 Regular astigmatism, bilateral: Secondary | ICD-10-CM | POA: Diagnosis not present

## 2018-01-22 DIAGNOSIS — H353131 Nonexudative age-related macular degeneration, bilateral, early dry stage: Secondary | ICD-10-CM | POA: Diagnosis not present

## 2018-01-22 DIAGNOSIS — E119 Type 2 diabetes mellitus without complications: Secondary | ICD-10-CM | POA: Diagnosis not present

## 2018-01-22 DIAGNOSIS — H401132 Primary open-angle glaucoma, bilateral, moderate stage: Secondary | ICD-10-CM | POA: Diagnosis not present

## 2018-01-22 DIAGNOSIS — H3530 Unspecified macular degeneration: Secondary | ICD-10-CM | POA: Diagnosis not present

## 2018-01-22 DIAGNOSIS — H524 Presbyopia: Secondary | ICD-10-CM | POA: Diagnosis not present

## 2018-02-12 ENCOUNTER — Ambulatory Visit (INDEPENDENT_AMBULATORY_CARE_PROVIDER_SITE_OTHER): Payer: Medicare HMO | Admitting: Family Medicine

## 2018-02-12 ENCOUNTER — Encounter: Payer: Self-pay | Admitting: Family Medicine

## 2018-02-12 VITALS — BP 128/70 | HR 62 | Temp 98.0°F | Ht 68.0 in | Wt 202.0 lb

## 2018-02-12 DIAGNOSIS — R142 Eructation: Secondary | ICD-10-CM

## 2018-02-12 DIAGNOSIS — R195 Other fecal abnormalities: Secondary | ICD-10-CM | POA: Diagnosis not present

## 2018-02-12 LAB — HM DIABETES EYE EXAM

## 2018-02-12 NOTE — Patient Instructions (Addendum)
Health Maintenance Due  Topic Date Due  . OPHTHALMOLOGY EXAM -called and requested that they fax over the report 01/09/2018   For loose stools - lets have you cut out the alcohol to see if that resolves issues - can also use imodium on as needed basis  For burping/belching - cutting alcohol may help - could trial gas-x/simethicone - could trial tums on top of the pepcid  If not helpful and this remains distressing could get you back in with GI

## 2018-02-12 NOTE — Progress Notes (Signed)
Subjective:  Eddie Ferguson is a 77 y.o. year old very pleasant male patient who presents for/with See problem oriented charting ROS-no fevers, chills, fatigue/malaise, vomiting, or recent weight change   Past Medical History-  Patient Active Problem List   Diagnosis Date Noted  . Mild cognitive impairment 06/16/2015    Priority: High  . Prostate cancer ([place]) 05/28/2014    Priority: High  . Diabetes mellitus type 2, controlled ([place]) 04/23/2014    Priority: High  . CKD (chronic kidney disease), stage III ([place]) 04/18/2017    Priority: Medium  . Gout 07/09/2015    Priority: Medium  . Essential hypertension 04/23/2014    Priority: Medium  . Hyperlipidemia 04/23/2014    Priority: Medium  . Hypothyroidism 04/23/2014    Priority: Medium  . Stomach ulcer     Priority: Medium  . Adenomatous colon polyp 05/28/2014    Priority: Low  . Osteoarthritis 05/28/2014    Priority: Low  . Erectile dysfunction 05/28/2014    Priority: Low  . Basal cell carcinoma 05/28/2014    Priority: Low  . GERD (gastroesophageal reflux disease) 04/23/2014    Priority: Low  . Allergic rhinitis 04/23/2014    Priority: Low  . Glaucoma 04/23/2014    Priority: Low  . Aortic atherosclerosis ([place]) 01/05/2018  . Mild dementia ([place]) 09/26/2017  . History of nonmelanoma skin cancer 11/18/2012  . History of shingles 04/24/2011    Medications- reviewed and updated Current Outpatient Medications  Medication Sig Dispense Refill  . acetaminophen (TYLENOL) 500 MG tablet Take 500 mg by mouth every 6 (six) hours as needed. Reported on 06/16/2015    . allopurinol (ZYLOPRIM) 100 MG tablet Take 1 tablet (100 mg total) by mouth 2 (two) times daily. 180 tablet 1  . amLODipine (NORVASC) 5 MG tablet TAKE 1 TABLET BY MOUTH EVERY DAY 90 tablet 1  . carvedilol (COREG) 25 MG tablet Take 0.5 tablets (12.5 mg total) by mouth 2 (two) times daily with a meal. 180 tablet 3  . chlorthalidone (HYGROTON) 25 MG tablet Take 0.5-1  tablets (12.5-25 mg total) by mouth daily. 90 tablet 2  . Cholecalciferol (VITAMIN D) 2000 UNITS CAPS Take by mouth daily.      Marland Kitchen donepezil (ARICEPT) 10 MG tablet Take 1 tablet (10 mg total) by mouth daily. 90 tablet 3  . fexofenadine (ALLEGRA) 180 MG tablet Take 180 mg by mouth.    . fluticasone (FLONASE) 50 MCG/ACT nasal spray Place 2 sprays into both nostrils daily. 48 g 3  . glucose blood (FREESTYLE LITE) test strip Use to check blood sugars daily. Dx: E11.9 100 each 12  . KLOR-CON M20 20 MEQ tablet TAKE 2 TABLETS BY MOUTH 2 TIMES DAILY. 360 tablet 1  . latanoprost (XALATAN) 0.005 % ophthalmic solution Place 1 drop into both eyes at bedtime.     Marland Kitchen levothyroxine (SYNTHROID) 50 MCG tablet Take 1 tablet (50 mcg total) by mouth daily. 90 tablet 3  . methocarbamol (ROBAXIN) 750 MG tablet Take 1 tablet (750 mg total) by mouth every 8 (eight) hours as needed for muscle spasms. 60 tablet 3  . Multiple Vitamin (MULTIVITAMIN) tablet Take 1 tablet by mouth daily.      . Omega-3 Fatty Acids (FISH OIL) 1200 MG CAPS Take by mouth daily.      Marland Kitchen omeprazole (PRILOSEC) 20 MG capsule Take 20 mg by mouth daily.    . pravastatin (PRAVACHOL) 40 MG tablet Take 1 tablet (40 mg total) by mouth daily. 90 tablet  3  . quinapril (ACCUPRIL) 40 MG tablet Take 1 tablet (40 mg total) by mouth daily. 90 tablet 3   No current facility-administered medications for this visit.     Objective: BP 128/70 (BP Location: Left Arm, Patient Position: Sitting, Cuff Size: Large)   Pulse 62   Temp 98 F (36.7 C) (Oral)   Ht 5\' 8"  (1.727 m)   Wt 202 lb (91.6 kg)   SpO2 95%   BMI 30.71 kg/m  Gen: NAD, appears comfortable CV: RRR no murmurs rubs or gallops Lungs: CTAB no crackles, wheeze, rhonchi Abdomen: soft/nontender/nondistended/normal bowel sounds. No rebound or guarding.  Ext: no edema Skin: warm, dry  Assessment/Plan:  Loose stools  Burping  Belching S: on and off loose stools - tends to group around times where  he drinks beer- almost uncontrollable. Has had fecal incontinence with it. Had 3 beers last night and was up and down 2 hours - very lose. 2 weeks ago had a bout with this as well.   Also has a fair amount of burping. Recently has noted a sour smell. Takes tums- not sure if it helps. Does feel nauseous with this- but has not had vomiting. Heavy gurgling sounds from stomac  Sparing imodium has helped.   A/P: 77 year old male with loose stools mainly after alcohol as well as some burping belching with benign abdominal exam and no red flags. Also up to date on colonoscopy as of 04/13/16 with 5 year repeat planned.   Plan per avs "For loose stools - lets have you cut out the alcohol to see if that resolves issues - can also use imodium on as needed basis  For burping/belching - cutting alcohol may help - could trial gas-x/simethicone - could trial tums on top of the pepcid  If not helpful and this remains distressing could get you back in with GI "  Future Appointments  Date Time Provider [place]  02/16/2018  7:45 AM Gardiner Barefoot, DPM [facility]  05/10/2018  8:30 AM Cameron Sprang, MD [facility] None  05/29/2018  9:20 AM Marin Olp, MD [facility]  already scheduled 4 month folllow up  Return precautions advised.  Garret Reddish, MD

## 2018-02-13 ENCOUNTER — Encounter: Payer: Self-pay | Admitting: Family Medicine

## 2018-02-16 ENCOUNTER — Ambulatory Visit: Payer: Medicare HMO | Admitting: Podiatry

## 2018-02-16 ENCOUNTER — Encounter: Payer: Self-pay | Admitting: Podiatry

## 2018-02-16 DIAGNOSIS — M79674 Pain in right toe(s): Secondary | ICD-10-CM

## 2018-02-16 DIAGNOSIS — M79675 Pain in left toe(s): Secondary | ICD-10-CM

## 2018-02-16 DIAGNOSIS — Q828 Other specified congenital malformations of skin: Secondary | ICD-10-CM

## 2018-02-16 DIAGNOSIS — M216X9 Other acquired deformities of unspecified foot: Secondary | ICD-10-CM

## 2018-02-16 DIAGNOSIS — B351 Tinea unguium: Secondary | ICD-10-CM

## 2018-02-16 DIAGNOSIS — E119 Type 2 diabetes mellitus without complications: Secondary | ICD-10-CM

## 2018-02-16 NOTE — Progress Notes (Signed)
This patient presents to the office for continued evaluation and treatment of painful calluses on the bottom of both feet.  He was seen in the office initially treated  With debridement  and dispersion padding.  He presents the office today saying that the callus has returned and is causing pain and discomfort walking and wearing his shoes.  He also says his nails have grown thick and long.  He has paon walking in his shoes.  He presents for preventative foot care services.     Podiatric Exam: Vascular: dorsalis pedis and posterior tibial pulses are palpable bilateral. Capillary return is immediate. Temperature gradient is WNL. Skin turgor WNL  Sensorium: Normal Semmes Weinstein monofilament test. Normal tactile sensation bilaterally. Nail Exam: normotropic  nails with no evidence of bacterial or fungal infection Ulcer Exam: There is no evidence of ulcer or pre-ulcerative changes or infection. Orthopedic Exam: Muscle tone and strength are WNL. No limitations in general ROM. No crepitus or effusions noted. Foot type and digits show no abnormalities. Plantar flexed fifth metatarsal  B/L.  Midfoot and rearfoot arthritis right foot. 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 No infection or ulcer.  Porokeratosis  B/L  Debridement of porokeratosis  Padding applied to his powerstep insoles. Debridement of nails  B/l.   RTC prn.    DPM 

## 2018-03-20 ENCOUNTER — Other Ambulatory Visit: Payer: Self-pay

## 2018-03-20 MED ORDER — ALLOPURINOL 100 MG PO TABS: 100.0000 mg | ORAL_TABLET | Freq: Two times a day (BID) | ORAL | 1 refills | Status: DC

## 2018-04-06 ENCOUNTER — Other Ambulatory Visit: Payer: Self-pay | Admitting: Family Medicine

## 2018-04-22 ENCOUNTER — Encounter: Payer: Self-pay | Admitting: Family Medicine

## 2018-04-22 DIAGNOSIS — R69 Illness, unspecified: Secondary | ICD-10-CM | POA: Diagnosis not present

## 2018-04-22 DIAGNOSIS — J069 Acute upper respiratory infection, unspecified: Secondary | ICD-10-CM | POA: Diagnosis not present

## 2018-04-22 DIAGNOSIS — R05 Cough: Secondary | ICD-10-CM | POA: Diagnosis not present

## 2018-04-22 DIAGNOSIS — E039 Hypothyroidism, unspecified: Secondary | ICD-10-CM | POA: Diagnosis not present

## 2018-04-22 DIAGNOSIS — I10 Essential (primary) hypertension: Secondary | ICD-10-CM | POA: Diagnosis not present

## 2018-04-22 DIAGNOSIS — M62830 Muscle spasm of back: Secondary | ICD-10-CM | POA: Diagnosis not present

## 2018-04-25 ENCOUNTER — Ambulatory Visit: Payer: Medicare HMO | Admitting: Family Medicine

## 2018-04-27 ENCOUNTER — Ambulatory Visit: Payer: Medicare HMO | Admitting: Family Medicine

## 2018-04-27 ENCOUNTER — Encounter: Payer: Self-pay | Admitting: Family Medicine

## 2018-04-27 ENCOUNTER — Ambulatory Visit (INDEPENDENT_AMBULATORY_CARE_PROVIDER_SITE_OTHER): Payer: Medicare HMO | Admitting: Family Medicine

## 2018-04-27 VITALS — BP 122/68 | HR 82 | Temp 98.1°F | Ht 68.0 in | Wt 197.0 lb

## 2018-04-27 DIAGNOSIS — R509 Fever, unspecified: Secondary | ICD-10-CM | POA: Diagnosis not present

## 2018-04-27 DIAGNOSIS — R5383 Other fatigue: Secondary | ICD-10-CM

## 2018-04-27 DIAGNOSIS — E119 Type 2 diabetes mellitus without complications: Secondary | ICD-10-CM | POA: Diagnosis not present

## 2018-04-27 LAB — CBC WITH DIFFERENTIAL/PLATELET
Basophils Absolute: 0.1 10*3/uL (ref 0.0–0.1)
Basophils Relative: 0.7 % (ref 0.0–3.0)
Eosinophils Absolute: 0.2 10*3/uL (ref 0.0–0.7)
Eosinophils Relative: 2.3 % (ref 0.0–5.0)
HCT: 41.9 % (ref 39.0–52.0)
Hemoglobin: 14.4 g/dL (ref 13.0–17.0)
Lymphocytes Relative: 13 % (ref 12.0–46.0)
Lymphs Abs: 1 10*3/uL (ref 0.7–4.0)
MCHC: 34.5 g/dL (ref 30.0–36.0)
MCV: 91.9 fl (ref 78.0–100.0)
Monocytes Absolute: 0.7 10*3/uL (ref 0.1–1.0)
Monocytes Relative: 9.1 % (ref 3.0–12.0)
Neutro Abs: 5.5 10*3/uL (ref 1.4–7.7)
Neutrophils Relative %: 74.9 % (ref 43.0–77.0)
Platelets: 201 10*3/uL (ref 150.0–400.0)
RBC: 4.55 Mil/uL (ref 4.22–5.81)
RDW: 14 % (ref 11.5–15.5)
WBC: 7.4 10*3/uL (ref 4.0–10.5)

## 2018-04-27 LAB — COMPREHENSIVE METABOLIC PANEL
ALT: 74 U/L — ABNORMAL HIGH (ref 0–53)
AST: 45 U/L — ABNORMAL HIGH (ref 0–37)
Albumin: 4.1 g/dL (ref 3.5–5.2)
Alkaline Phosphatase: 86 U/L (ref 39–117)
BILIRUBIN TOTAL: 0.6 mg/dL (ref 0.2–1.2)
BUN: 18 mg/dL (ref 6–23)
CO2: 34 meq/L — AB (ref 19–32)
Calcium: 9.3 mg/dL (ref 8.4–10.5)
Chloride: 96 mEq/L (ref 96–112)
Creatinine, Ser: 1.32 mg/dL (ref 0.40–1.50)
GFR: 52.2 mL/min — ABNORMAL LOW (ref >60.00)
Glucose, Bld: 140 mg/dL — ABNORMAL HIGH (ref 70–99)
Potassium: 3.7 mEq/L (ref 3.5–5.1)
Sodium: 138 mEq/L (ref 135–145)
Total Protein: 6.1 g/dL (ref 6.0–8.3)

## 2018-04-27 LAB — POC URINALSYSI DIPSTICK (AUTOMATED)
Bilirubin, UA: NEGATIVE
Blood, UA: NEGATIVE
Glucose, UA: NEGATIVE
Ketones, UA: POSITIVE
LEUKOCYTES UA: NEGATIVE
Nitrite, UA: NEGATIVE
Protein, UA: NEGATIVE
Spec Grav, UA: >=1.03 — AB (ref 1.010–1.025)
Urobilinogen, UA: 0.2 E.U./dL
pH, UA: 6 (ref 5.0–8.0)

## 2018-04-27 LAB — HEMOGLOBIN A1C: Hgb A1c MFr Bld: 6.7 % — ABNORMAL HIGH (ref 4.6–6.5)

## 2018-04-27 LAB — TSH: TSH: 3.16 u[IU]/mL (ref 0.35–4.50)

## 2018-04-27 MED ORDER — DOXYCYCLINE HYCLATE 100 MG PO TABS: 100.0000 mg | ORAL_TABLET | Freq: Two times a day (BID) | ORAL | 0 refills | Status: DC

## 2018-04-27 NOTE — Patient Instructions (Addendum)
Sign release of information at the check out desk for records from [facility]   Please stop by lab before you go If you do not have mychart- we will call you about results within 5 business days of [place] receiving them.  If you have mychart- we will send your results within 3 business days of [place] receiving them.  If abnormal or we want to clarify a result, we will call or mychart you to make sure you receive the message.  If you have questions or concerns or don't hear within 5-7 days, please send [place] a message or call us.   We are going to trial doxycycline for 10 days in case this is nonclearing sinusitis  If you have new or worsening symptoms or symptoms do not resolve within 10 days or symptoms come back- lets check in - not next week but week after that

## 2018-04-27 NOTE — Progress Notes (Signed)
Phone [phone]   Subjective:  Eddie Ferguson is a 78 y.o. year old very pleasant male patient who presents for/with See problem oriented charting ROS- low appetite. No vomiting. Night sweats noted. Intermittent fevers over a month. Has lost weight. No burning with peeing. Has noted nasal congestion and runny nose. Frontal sinus pressure.    Past Medical History-  Patient Active Problem List   Diagnosis Date Noted  . Mild cognitive impairment 06/16/2015    Priority: High  . Prostate cancer ([place]) 05/28/2014    Priority: High  . Diabetes mellitus type 2, controlled ([place]) 04/23/2014    Priority: High  . CKD (chronic kidney disease), stage III ([place]) 04/18/2017    Priority: Medium  . Gout 07/09/2015    Priority: Medium  . Essential hypertension 04/23/2014    Priority: Medium  . Hyperlipidemia 04/23/2014    Priority: Medium  . Hypothyroidism 04/23/2014    Priority: Medium  . Stomach ulcer     Priority: Medium  . Adenomatous colon polyp 05/28/2014    Priority: Low  . Osteoarthritis 05/28/2014    Priority: Low  . Erectile dysfunction 05/28/2014    Priority: Low  . Basal cell carcinoma 05/28/2014    Priority: Low  . GERD (gastroesophageal reflux disease) 04/23/2014    Priority: Low  . Allergic rhinitis 04/23/2014    Priority: Low  . Glaucoma 04/23/2014    Priority: Low  . Aortic atherosclerosis ([place]) 01/05/2018  . Mild dementia ([place]) 09/26/2017  . History of nonmelanoma skin cancer 11/18/2012  . History of shingles 04/24/2011    Medications- reviewed and updated Current Outpatient Medications  Medication Sig Dispense Refill  . acetaminophen (TYLENOL) 500 MG tablet Take 500 mg by mouth every 6 (six) hours as needed. Reported on 06/16/2015    . allopurinol (ZYLOPRIM) 100 MG tablet Take 1 tablet (100 mg total) by mouth 2 (two) times daily. 180 tablet 1  . amLODipine (NORVASC) 5 MG tablet TAKE 1 TABLET BY MOUTH EVERY DAY 90 tablet 1  . carvedilol (COREG) 25 MG tablet  Take 0.5 tablets (12.5 mg total) by mouth 2 (two) times daily with a meal. 180 tablet 3  . chlorthalidone (HYGROTON) 25 MG tablet Take 0.5-1 tablets (12.5-25 mg total) by mouth daily. 90 tablet 2  . Cholecalciferol (VITAMIN D) 2000 UNITS CAPS Take by mouth daily.      Marland Kitchen donepezil (ARICEPT) 10 MG tablet Take 1 tablet (10 mg total) by mouth daily. 90 tablet 3  . fexofenadine (ALLEGRA) 180 MG tablet Take 180 mg by mouth.    . fluticasone (FLONASE) 50 MCG/ACT nasal spray Place 2 sprays into both nostrils daily. 48 g 3  . glucose blood (FREESTYLE LITE) test strip Use to check blood sugars daily. Dx: E11.9 100 each 12  . KLOR-CON M20 20 MEQ tablet TAKE 2 TABLETS BY MOUTH 2 TIMES DAILY. 360 tablet 1  . latanoprost (XALATAN) 0.005 % ophthalmic solution Place 1 drop into both eyes at bedtime.     Marland Kitchen levothyroxine (SYNTHROID) 50 MCG tablet Take 1 tablet (50 mcg total) by mouth daily. 90 tablet 3  . methocarbamol (ROBAXIN) 750 MG tablet Take 1 tablet (750 mg total) by mouth every 8 (eight) hours as needed for muscle spasms. 60 tablet 3  . Multiple Vitamin (MULTIVITAMIN) tablet Take 1 tablet by mouth daily.      . Omega-3 Fatty Acids (FISH OIL) 1200 MG CAPS Take by mouth daily.      Marland Kitchen omeprazole (PRILOSEC) 20 MG capsule  Take 20 mg by mouth daily.    . pravastatin (PRAVACHOL) 40 MG tablet TAKE 1 TABLET BY MOUTH EVERY DAY 90 tablet 3  . quinapril (ACCUPRIL) 40 MG tablet TAKE 1 TABLET BY MOUTH EVERY DAY 90 tablet 3  . doxycycline (VIBRA-TABS) 100 MG tablet Take 1 tablet (100 mg total) by mouth 2 (two) times daily. 20 tablet 0   No current facility-administered medications for this visit.      Objective:  BP 122/68   Pulse 82   Temp 98.1 F (36.7 C) (Oral)   Wt 197 lb (89.4 kg)   SpO2 98%   BMI 29.95 kg/m  Gen: NAD, resting comfortably Frontal and maxillary sinus tenderness noted.  Nasal turbinates erythematous with yellow discharge. CV: RRR no murmurs rubs or gallops Lungs: CTAB no crackles, wheeze,  rhonchi Abdomen: soft/nontender/nondistended/normal bowel sounds. Ext: no edema Skin: warm, dry Neuro: Defers to wife for some answers, normal gait  No cervical, submandibular, supra clavicular, posterior neck, axillary, groin lymphadenopathy noted    Assessment and Plan   Fever/night sweats- Plan: CBC with Differential/Platelet, Comprehensive metabolic panel, POCT Urinalysis Dipstick (Automated), POCT Urinalysis Dipstick (Automated) Fatigue, unspecified type - Plan: TSH S:  At the beginning of the year started with some intermittent symptoms of fever, chills and then cancelled visit as did better and has had recurrent similar issues. This week had several temps up into 102- on Tuesday and Thursday. Just got home from [place] last week- didn't have thermometer last week but seemed flushed. Sometimes would check but was taking tylenol to help. He has had night sweats intermittently during this time as well- more often then not. Down 11 lbs since October and is not eating well. Will sleep more than usual- 12 hours last 2 days and normally "not a sleeper". Feels like has a sour stomach when he eats and has had low appetite. He feels weak overall.  Complains of sinus pressure over the last month-worse in the frontal area  Patient has seemed more confused during this last month per wife.  Went to [facility] in [place]. Diagnosed with URI. They report having chest x-ray. No bloodwork or uine was take.   A/P: Patient with B symptoms- concern for potential lymphoma.  No obvious findings on lymph nodes easily accessible.  On the other hand does have sinus pressure and frontal area which could represent a frontal sinusitis-this could also be a headache-we are going to try doxycycline for 10 days and then see him back to [facility] consider neuroimaging with new headache pattern over 50 if no improvement in symptoms.  The increased confusion makes me lean toward scanning as well-on the other hand has  lower brain reserve due to mild cognitive impairment and fever/illness could cause issues with memory.  Advised 2-week follow-up or sooner if needed-unless has full resolution of symptoms.  Diabetes mellitus type 2, controlled ([place]) S: Controlled in the recent past without medication.  Unfortunately he has been drinking a lot of Coke as that seems to settle his stomach. Lab Results  Component Value Date   HGBA1C 6.1 (A) 12/01/2017   A/P: Update A1c is concerning for worsening control  Future Appointments  Date Time Provider [place]  05/10/2018  8:30 AM Cameron Sprang, MD [facility] None  05/16/2018  8:15 AM Gardiner Barefoot, DPM [facility]  05/16/2018 11:00 AM Marin Olp, MD [facility]  05/29/2018  9:20 AM Yong Channel Brayton Mars, MD [facility]   Lab/Order associations: Fever, unspecified fever  cause - Plan: CBC with Differential/Platelet, Comprehensive metabolic panel, POCT Urinalysis Dipstick (Automated), POCT Urinalysis Dipstick (Automated)  Controlled type 2 diabetes mellitus without complication, without long-term current use of insulin ([facility]) - Plan: Hemoglobin A1c  Fatigue, unspecified type - Plan: TSH  Meds ordered this encounter  Medications  . doxycycline (VIBRA-TABS) 100 MG tablet    Sig: Take 1 tablet (100 mg total) by mouth 2 (two) times daily.    Dispense:  20 tablet    Refill:  0    Return precautions advised.  Garret Reddish, MD

## 2018-04-27 NOTE — Assessment & Plan Note (Signed)
S: Controlled in the recent past without medication.  Unfortunately he has been drinking a lot of Coke as that seems to settle his stomach. Lab Results  Component Value Date   HGBA1C 6.1 (A) 12/01/2017   A/P: Update A1c is concerning for worsening control

## 2018-04-29 ENCOUNTER — Other Ambulatory Visit: Payer: Self-pay | Admitting: Family Medicine

## 2018-04-30 ENCOUNTER — Other Ambulatory Visit: Payer: Self-pay | Admitting: Family Medicine

## 2018-04-30 DIAGNOSIS — R7401 Elevation of levels of liver transaminase levels: Secondary | ICD-10-CM

## 2018-04-30 DIAGNOSIS — R740 Nonspecific elevation of levels of transaminase and lactic acid dehydrogenase [LDH]: Principal | ICD-10-CM

## 2018-04-30 NOTE — Progress Notes (Signed)
le

## 2018-05-01 ENCOUNTER — Encounter: Payer: Self-pay | Admitting: Family Medicine

## 2018-05-01 NOTE — Progress Notes (Signed)
Done

## 2018-05-07 ENCOUNTER — Telehealth: Payer: Self-pay | Admitting: Family Medicine

## 2018-05-07 NOTE — Telephone Encounter (Signed)
No phone call. Received [place] records for ER- accidentally opened phone note  Flu test negative  Diagnosed with URI  CXR no acute thoracid process. No lung consolidations on 04/22/2018

## 2018-05-10 ENCOUNTER — Ambulatory Visit: Payer: Medicare HMO | Admitting: Neurology

## 2018-05-10 ENCOUNTER — Encounter: Payer: Self-pay | Admitting: Neurology

## 2018-05-10 ENCOUNTER — Other Ambulatory Visit: Payer: Self-pay

## 2018-05-10 VITALS — BP 136/72 | HR 60 | Ht 68.0 in | Wt 197.0 lb

## 2018-05-10 DIAGNOSIS — F0390 Unspecified dementia without behavioral disturbance: Secondary | ICD-10-CM | POA: Diagnosis not present

## 2018-05-10 DIAGNOSIS — F03A0 Unspecified dementia, mild, without behavioral disturbance, psychotic disturbance, mood disturbance, and anxiety: Secondary | ICD-10-CM

## 2018-05-10 DIAGNOSIS — R69 Illness, unspecified: Secondary | ICD-10-CM | POA: Diagnosis not present

## 2018-05-10 MED ORDER — DONEPEZIL HCL 10 MG PO TABS: 10.0000 mg | ORAL_TABLET | Freq: Every day | ORAL | 3 refills | Status: DC

## 2018-05-10 NOTE — Progress Notes (Signed)
NEUROLOGY FOLLOW UP OFFICE NOTE  Eddie Ferguson. Teel [id number] [birth year]  HISTORY OF PRESENT ILLNESS: I had the pleasure of seeing Eddie Ferguson in follow-up in the neurology clinic on 05/10/2018.  The patient was last seen 80 months ago for mild dementia. He is again accompanied by his wife who helps supplement the history today. MOCA score 23/30 in July 2019 (24/30 in January 2019. He is on Donepezil 10mg  daily. He continues to drive and uses his GPS regularly. His wife is always in the car with him and denies any concerns. He continues to manage his medications with a very detailed spreadsheet. He also manages finances without any difficulties. Some days he forgets his password on the computer, she can tell when he gets frustrated with himself, he would not recall what he was doing. No paranoia or hallucinations. They travelled to [place] for a week, he was sleeping all the time and "looked lost," he had a fever up to 102 and was treated with antibiotics for an URI and sinus infection. He is back to baseline now. He joined a dementia study at [facility] and was in a study arm where they did brain games. He reports having 2 MRI brain studies and memory testing before and after. He denies any headaches, dizziness, vision changes, focal numbness/tingling/weakness, no falls. Sleep is good, no wandering behavior. He has some right ankle pain.  History on Initial Assessment 03/27/2017: This is a very pleasant 78 year old right-handed man with a history of hypertension, hyperlipidemia, diabetes, hypothyroidism, presenting for evaluation of short-term memory loss. He and his wife report changes started around a year ago. He would not be able to recall where he ate the day prior. His wife reports he repeats himself and asks the same questions several times. He used to be accurate and precise, and has a very detailed spreadsheet of his medications (has similar one for bills), but now has difficulty using his computer.  He is better in the morning, but later in the afternoon, he cannot recall how he set up a worksheet or where it is in his files. He makes wrong turns while driving, but is able to get back on track easily. He occasionally misplaces things at home. His wife denies any personality changes, but he gets frustrated with himself because he is usually so precise. He has problems with multitasking. No paranoia or hallucinations. He is independent with ADLs. MMSE at his PCP office in November 2018 was 28/30. 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 His paternal grandmother had dementia. No history of significant head injuries. He drinks beer on occasion.   He has numbness in both feet. He has a left knee brace from a torn tendon. He denies any headaches, dizziness, diplopia, dysarthria/dypshagia, neck/back pain, focal numbness/tingling/weakness, bowel/bladder dysfunction, anosmia, or tremors.   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   PAST MEDICAL HISTORY: Past Medical History:  Diagnosis Date  . Allergy   . Ankle injury 06/01/2001   no surgery- skin debridement   . Arthritis   . Basal cell carcinoma 05/28/2014   L nasal tip. Mohs Dr. Link Snuffer.    . Blood transfusion    2010 because of stomach ulcers  . Cataract    bilaterally removed  .  Diabetes mellitus   . GERD (gastroesophageal reflux disease)   . Glaucoma   . Hyperlipidemia   . Hypertension   . Hypothyroidism   . Pneumonia 04/12/2006  . Prostate cancer ([place])   . Seasonal allergies   . Sinusitis    treated for bacterial infection at least once a year at [facility]  . Stomach ulcer 2010   bleeding ulcer result NSAIDS    MEDICATIONS: Current Outpatient Medications on File  Prior to Visit  Medication Sig Dispense Refill  . acetaminophen (TYLENOL) 500 MG tablet Take 500 mg by mouth every 6 (six) hours as needed. Reported on 06/16/2015    . allopurinol (ZYLOPRIM) 100 MG tablet Take 1 tablet (100 mg total) by mouth 2 (two) times daily. 180 tablet 1  . amLODipine (NORVASC) 5 MG tablet TAKE 1 TABLET BY MOUTH EVERY DAY 90 tablet 1  . carvedilol (COREG) 25 MG tablet Take 0.5 tablets (12.5 mg total) by mouth 2 (two) times daily with a meal. 180 tablet 3  . chlorthalidone (HYGROTON) 25 MG tablet Take 0.5-1 tablets (12.5-25 mg total) by mouth daily. 90 tablet 2  . Cholecalciferol (VITAMIN D) 2000 UNITS CAPS Take by mouth daily.      Marland Kitchen donepezil (ARICEPT) 10 MG tablet Take 1 tablet (10 mg total) by mouth daily. 90 tablet 3  . doxycycline (VIBRA-TABS) 100 MG tablet Take 1 tablet (100 mg total) by mouth 2 (two) times daily. 20 tablet 0  . fexofenadine (ALLEGRA) 180 MG tablet Take 180 mg by mouth.    . fluticasone (FLONASE) 50 MCG/ACT nasal spray Place 2 sprays into both nostrils daily. 48 g 3  . glucose blood (FREESTYLE LITE) test strip Use to check blood sugars daily. Dx: E11.9 100 each 12  . KLOR-CON M20 20 MEQ tablet TAKE 2 TABLETS BY MOUTH 2 TIMES DAILY. 360 tablet 1  . latanoprost (XALATAN) 0.005 % ophthalmic solution Place 1 drop into both eyes at bedtime.     Marland Kitchen levothyroxine (SYNTHROID, LEVOTHROID) 50 MCG tablet TAKE 1 TABLET BY MOUTH EVERY DAY 90 tablet 3  . methocarbamol (ROBAXIN) 750 MG tablet Take 1 tablet (750 mg total) by mouth every 8 (eight) hours as needed for muscle spasms. 60 tablet 3  . Multiple Vitamin (MULTIVITAMIN) tablet Take 1 tablet by mouth daily.      . Omega-3 Fatty Acids (FISH OIL) 1200 MG CAPS Take by mouth daily.      Marland Kitchen omeprazole (PRILOSEC) 20 MG capsule Take 20 mg by mouth daily.    . pravastatin (PRAVACHOL) 40 MG tablet TAKE 1 TABLET BY MOUTH EVERY DAY 90 tablet 3  . quinapril (ACCUPRIL) 40 MG tablet TAKE 1 TABLET BY MOUTH EVERY DAY 90 tablet 3     No current facility-administered medications on file prior to visit.     ALLERGIES: Allergies  Allergen Reactions  . Aspirin Other (See Comments)    Stomach ulcers  . Nsaids Other (See Comments)    Stomach ulcers  . Amoxicillin-Pot Clavulanate Rash  . Sulfa Antibiotics Swelling and Rash    FAMILY HISTORY: Family History  Problem Relation Age of Onset  . Diabetes Mother   . Diabetes Father   . Hyperlipidemia Father   . Colon polyps Father   . Brain cancer Brother        smoker  . Dementia Paternal Grandmother   . Colon cancer Neg Hx   . Esophageal cancer Neg Hx   . Stomach cancer Neg Hx   . Rectal cancer  Neg Hx     SOCIAL HISTORY: Social History   Socioeconomic History  . Marital status: Married    Spouse name: Not on file  . Number of children: Not on file  . Years of education: Not on file  . Highest education level: Not on file  Occupational History  . Not on file  Social Needs  . Financial resource strain: Not on file  . Food insecurity:    Worry: Not on file    Inability: Not on file  . Transportation needs:    Medical: Not on file    Non-medical: Not on file  Tobacco Use  . Smoking status: Former Smoker    Last attempt to quit: 02/03/2001    Years since quitting: 17.2  . Smokeless tobacco: Never Used  . Tobacco comment: intermittent cigar use stopped  Substance and Sexual Activity  . Alcohol use: Yes    Alcohol/week: 0.0 standard drinks    Comment: occasional beer  . Drug use: No  . Sexual activity: Not on file  Lifestyle  . Physical activity:    Days per week: Not on file    Minutes per session: Not on file  . Stress: Not on file  Relationships  . Social connections:    Talks on phone: Not on file    Gets together: Not on file    Attends religious service: Not on file    Active member of club or organization: Not on file    Attends meetings of clubs or organizations: Not on file    Relationship status: Not on file  . Intimate partner  violence:    Fear of current or ex partner: Not on file    Emotionally abused: Not on file    Physically abused: Not on file    Forced sexual activity: Not on file  Other Topics Concern  . Not on file  Social History Narrative   Married (wife patient outside practice). 2 children. 5 grandkids.       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       Hobbies: travel, enjoys going to shows, [facility] women's basketball tournament. Doesn't watch tv.             Lives in 1 story condo on the 3rd floor   Bachelor's degree    REVIEW OF SYSTEMS: Constitutional: No fevers, chills, or sweats, no generalized fatigue, change in appetite Eyes: No visual changes, double vision, eye pain Ear, nose and throat: No hearing loss, ear pain, nasal congestion, sore throat Cardiovascular: No chest pain, palpitations Respiratory:  No shortness of breath at rest or with exertion, wheezes GastrointestinaI: No nausea, vomiting, diarrhea, abdominal pain, fecal incontinence Genitourinary:  No dysuria, urinary retention or frequency Musculoskeletal:  No neck pain, back pain Integumentary: No rash, pruritus, skin lesions Neurological: as above Psychiatric: No depression, insomnia, anxiety Endocrine: No palpitations, fatigue, diaphoresis, mood swings, change in appetite, change in weight, increased thirst Hematologic/Lymphatic:  No anemia, purpura, petechiae. Allergic/Immunologic: no itchy/runny eyes, nasal congestion, recent allergic reactions, rashes  PHYSICAL EXAM: Vitals:   05/10/18 0856  BP: 136/72  Pulse: 60  SpO2: 98%   General: No acute distress Head:  Normocephalic/atraumatic Neck: supple, no paraspinal tenderness, full range of motion Heart:  Regular rate and rhythm Lungs:  Clear to auscultation bilaterally Back: No paraspinal tenderness Skin/Extremities: No rash, no edema Neurological Exam: alert and oriented to person, place, and time. No aphasia or dysarthria. Fund of knowledge is appropriate.   Remote memory  are intact.  Attention and concentration are normal.    Able to name objects and repeat phrases.  Montreal Cognitive Assessment  05/10/2018 09/26/2017 03/27/2017  Visuospatial/ Executive (0/5) 2 4 4   Naming (0/3) 3 2 3   Attention: Read list of digits (0/2) 2 2 2   Attention: Read list of letters (0/1) 1 1 1   Attention: Serial 7 subtraction starting at 100 (0/3) 3 3 3   Language: Repeat phrase (0/2) 2 2 2   Language : Fluency (0/1) 1 1 1   Abstraction (0/2) 2 2 2   Delayed Recall (0/5) 0 0 0  Orientation (0/6) 6 6 6   Total 22 23 24    Cranial nerves: Pupils equal, round, reactive to light. Extraocular movements intact with no nystagmus. Visual fields full. Facial sensation intact. No facial asymmetry. Tongue, uvula, palate midline.  Motor: Bulk and tone normal, muscle strength 5/5 throughout with no pronator drift.  Sensation to light touch intact.  No extinction to double simultaneous stimulation. Finger to nose testing intact.  Gait narrow-based and steady, able to tandem walk adequately.  Romberg negative.  IMPRESSION: This is a very pleasant 78 yo RH man with vascular risk factors including hypertension, hyperlipidemia, diabetes, with worsening memory, he continues to manage complex tasks but was having more difficulty with driving. MOCA score today 22/30 (23/30 in July 2019, 24/30 in January 2019). 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 Continue Donepezil 10mg  daily. Continue to monitor driving. Continue to monitor mood. He continues to manage finances and medications, his wife was asked to check behind him. We again also discussed the importance of control of vascular risk factors, physical exercise, and brain stimulation exercises for brain health. He will follow-up in 6 months and knows to call for any changes.   Thank you for allowing me to participate in his care.  Please do not hesitate to call for any questions or concerns.  The duration of this appointment visit was  30 minutes of face-to-face time with the patient.  Greater than 50% of this time was spent in counseling, explanation of diagnosis, planning of further management, and coordination of care.   Ellouise Newer, M.D.   CC: Dr. Yong Channel

## 2018-05-10 NOTE — Patient Instructions (Signed)
1. Continue Donepezil 10mg  daily 2. Follow-up in 6 months or so, call for any changes  FALL PRECAUTIONS: Be cautious when walking. Scan the area for obstacles that may increase the risk of trips and falls. When getting up in the mornings, sit up at the edge of the bed for a few minutes before getting out of bed. Consider elevating the bed at the head end to avoid drop of blood pressure when getting up. Walk always in a well-lit room (use night lights in the walls). Avoid area rugs or power cords from appliances in the middle of the walkways. Use a walker or a cane if necessary and consider physical therapy for balance exercise. Get your eyesight checked regularly.  HOME SAFETY: Consider the safety of the kitchen when operating appliances like stoves, microwave oven, and blender. Consider having supervision and share cooking responsibilities until no longer able to participate in those. Accidents with firearms and other hazards in the house should be identified and addressed as well.  DRIVING: Regarding driving, in patients with progressive memory problems, driving will be impaired. We advise to have someone else do the driving if trouble finding directions or if minor accidents are reported. Independent driving assessment is available to determine safety of driving.  ABILITY TO BE LEFT ALONE: If patient is unable to contact 911 operator, consider using LifeLine, or when the need is there, arrange for someone to stay with patients. Smoking is a fire hazard, consider supervision or cessation. Risk of wandering should be assessed by caregiver and if detected at any point, supervision and safe proof recommendations should be instituted.  MEDICATION SUPERVISION: Inability to self-administer medication needs to be constantly addressed. Implement a mechanism to ensure safe administration of the medications.  RECOMMENDATIONS FOR ALL PATIENTS WITH MEMORY PROBLEMS: 1. Continue to exercise (Recommend 30 minutes  of walking everyday, or 3 hours every week) 2. Increase social interactions - continue going to [place] and enjoy social gatherings with friends and family 3. Eat healthy, avoid fried foods and eat more fruits and vegetables 4. Maintain adequate blood pressure, blood sugar, and blood cholesterol level. Reducing the risk of stroke and cardiovascular disease also helps promoting better memory. 5. Avoid stressful situations. Live a simple life and avoid aggravations. Organize your time and prepare for the next day in anticipation. 6. Sleep well, avoid any interruptions of sleep and avoid any distractions in the bedroom that may interfere with adequate sleep quality 7. Avoid sugar, avoid sweets as there is a strong link between excessive sugar intake, diabetes, and cognitive impairment The Mediterranean diet has been shown to help patients reduce the risk of progressive memory disorders and reduces cardiovascular risk. This includes eating fish, eat fruits and green leafy vegetables, nuts like almonds and hazelnuts, walnuts, and also use olive oil. Avoid fast foods and fried foods as much as possible. Avoid sweets and sugar as sugar use has been linked to worsening of memory function.  There is always a concern of gradual progression of memory problems. If this is the case, then we may need to adjust level of care according to patient needs. Support, both to the patient and caregiver, should then be put into place.

## 2018-05-16 ENCOUNTER — Encounter: Payer: Self-pay | Admitting: Family Medicine

## 2018-05-16 ENCOUNTER — Other Ambulatory Visit: Payer: Self-pay

## 2018-05-16 ENCOUNTER — Encounter: Payer: Self-pay | Admitting: Podiatry

## 2018-05-16 ENCOUNTER — Ambulatory Visit (INDEPENDENT_AMBULATORY_CARE_PROVIDER_SITE_OTHER): Payer: Medicare HMO | Admitting: Podiatry

## 2018-05-16 ENCOUNTER — Ambulatory Visit (INDEPENDENT_AMBULATORY_CARE_PROVIDER_SITE_OTHER): Payer: Medicare HMO | Admitting: Family Medicine

## 2018-05-16 VITALS — BP 130/68 | HR 70 | Temp 99.1°F | Ht 68.0 in | Wt 195.2 lb

## 2018-05-16 DIAGNOSIS — N183 Chronic kidney disease, stage 3 unspecified: Secondary | ICD-10-CM

## 2018-05-16 DIAGNOSIS — M216X9 Other acquired deformities of unspecified foot: Secondary | ICD-10-CM | POA: Diagnosis not present

## 2018-05-16 DIAGNOSIS — E1122 Type 2 diabetes mellitus with diabetic chronic kidney disease: Secondary | ICD-10-CM

## 2018-05-16 DIAGNOSIS — Q828 Other specified congenital malformations of skin: Secondary | ICD-10-CM

## 2018-05-16 DIAGNOSIS — M79605 Pain in left leg: Secondary | ICD-10-CM | POA: Diagnosis not present

## 2018-05-16 DIAGNOSIS — E119 Type 2 diabetes mellitus without complications: Secondary | ICD-10-CM | POA: Diagnosis not present

## 2018-05-16 DIAGNOSIS — I10 Essential (primary) hypertension: Secondary | ICD-10-CM

## 2018-05-16 DIAGNOSIS — R1032 Left lower quadrant pain: Secondary | ICD-10-CM | POA: Diagnosis not present

## 2018-05-16 NOTE — Assessment & Plan Note (Signed)
S: Well controlled without medication recently.  Updating diagnosis to reflect CKD stage III Lab Results  Component Value Date   HGBA1C 6.7 (H) 04/27/2018   A/P:  Stable. Continue current medications.

## 2018-05-16 NOTE — Assessment & Plan Note (Signed)
S: controlled on carvedilol 12.5mg  BID, diltiazem 180mg  XL, quinapril 40mg  BP Readings from Last 3 Encounters:  05/16/18 130/68  05/10/18 136/72  04/27/18 122/68  A/P:  Stable. Continue current medications.

## 2018-05-16 NOTE — Patient Instructions (Addendum)
Thrilled you are doing better  Sorry about the leg- I do think this is muscular. I like the bengay idea.   I want you to ice your thigh 3x a day for 20 minutes. After 3 days I want you to try heat 3x a day for 20 minutes.  If you have new or worsening symptoms or recurrence of other symptoms please see [place] back

## 2018-05-16 NOTE — Progress Notes (Signed)
This patient presents to the office for continued evaluation and treatment of painful calluses on the bottom of both feet.  He was seen in the office initially treated  with debridement  .  He presents the office today saying that the callus has returned and is causing pain and discomfort walking and wearing his shoes.  Patient is type 2 diabetic     Podiatric Exam: Vascular: dorsalis pedis and posterior tibial pulses are palpable bilateral. Capillary return is immediate. Temperature gradient is WNL. Skin turgor WNL  Sensorium: Normal Semmes Weinstein monofilament test. Normal tactile sensation bilaterally. Nail Exam:  Asymptomatic thick disfigured nails  Both feet. Ulcer Exam: There is no evidence of ulcer or pre-ulcerative changes or infection. Orthopedic Exam: Muscle tone and strength are WNL. No limitations in general ROM. No crepitus or effusions noted. Foot type and digits show no abnormalities. Bony prominences are unremarkable. Midfoot and rearfoot arthritis right foot. 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 No infection or ulcer.  Porokeratosis  B/L  Debridement of porokeratosis  Patient does not want nails trimmed.  RTC 3 months.  Gardiner Barefoot DPM

## 2018-05-16 NOTE — Progress Notes (Signed)
Phone [phone]   Subjective:  Eddie Ferguson. Eddie Ferguson is a 78 y.o. year old very pleasant male patient who presents for/with See problem oriented charting ROS-no fever, chills, significant headaches.  No night sweats.  Does have some left thigh and knee pain.  No leg weakness  Past Medical History-  Patient Active Problem List   Diagnosis Date Noted  . Mild cognitive impairment 06/16/2015    Priority: High  . Prostate cancer (Eddie Ferguson) 05/28/2014    Priority: High  . Type 2 diabetes mellitus with diabetic chronic kidney disease (Eddie Ferguson) 04/23/2014    Priority: High  . CKD (chronic kidney disease), stage III ([place]) 04/18/2017    Priority: Medium  . Gout 07/09/2015    Priority: Medium  . Essential hypertension 04/23/2014    Priority: Medium  . Hyperlipidemia 04/23/2014    Priority: Medium  . Hypothyroidism 04/23/2014    Priority: Medium  . Stomach ulcer     Priority: Medium  . Adenomatous colon polyp 05/28/2014    Priority: Low  . Osteoarthritis 05/28/2014    Priority: Low  . Erectile dysfunction 05/28/2014    Priority: Low  . Basal cell carcinoma 05/28/2014    Priority: Low  . GERD (gastroesophageal reflux disease) 04/23/2014    Priority: Low  . Allergic rhinitis 04/23/2014    Priority: Low  . Glaucoma 04/23/2014    Priority: Low  . Aortic atherosclerosis (Eddie Ferguson) 01/05/2018  . Mild dementia (Eddie Ferguson) 09/26/2017  . History of nonmelanoma skin cancer 11/18/2012  . History of shingles 04/24/2011    Medications- reviewed and updated Current Outpatient Medications  Medication Sig Dispense Refill  . acetaminophen (TYLENOL) 500 MG tablet Take 500 mg by mouth every 6 (six) hours as needed. Reported on 06/16/2015    . allopurinol (ZYLOPRIM) 100 MG tablet Take 1 tablet (100 mg total) by mouth 2 (two) times daily. 180 tablet 1  . amLODipine (NORVASC) 5 MG tablet TAKE 1 TABLET BY MOUTH EVERY DAY 90 tablet 1  . carvedilol (COREG) 25 MG tablet Take 0.5 tablets (12.5 mg total) by mouth 2 (two)  times daily with a meal. 180 tablet 3  . chlorthalidone (HYGROTON) 25 MG tablet Take 0.5-1 tablets (12.5-25 mg total) by mouth daily. 90 tablet 2  . Cholecalciferol (VITAMIN D) 2000 UNITS CAPS Take by mouth daily.      Marland Kitchen donepezil (ARICEPT) 10 MG tablet Take 1 tablet (10 mg total) by mouth daily. 90 tablet 3  . fexofenadine (ALLEGRA) 180 MG tablet Take 180 mg by mouth.    . fluticasone (FLONASE) 50 MCG/ACT nasal spray Place 2 sprays into both nostrils daily. 48 g 3  . glucose blood (FREESTYLE LITE) test strip Use to check blood sugars daily. Dx: E11.9 100 each 12  . KLOR-CON M20 20 MEQ tablet TAKE 2 TABLETS BY MOUTH 2 TIMES DAILY. 360 tablet 1  . latanoprost (XALATAN) 0.005 % ophthalmic solution Place 1 drop into both eyes at bedtime.     Marland Kitchen levothyroxine (SYNTHROID, LEVOTHROID) 50 MCG tablet TAKE 1 TABLET BY MOUTH EVERY DAY 90 tablet 3  . methocarbamol (ROBAXIN) 750 MG tablet Take 1 tablet (750 mg total) by mouth every 8 (eight) hours as needed for muscle spasms. 60 tablet 3  . Multiple Vitamin (MULTIVITAMIN) tablet Take 1 tablet by mouth daily.      . Omega-3 Fatty Acids (FISH OIL) 1200 MG CAPS Take by mouth daily.      . pravastatin (PRAVACHOL) 40 MG tablet TAKE 1 TABLET BY MOUTH EVERY DAY  90 tablet 3  . quinapril (ACCUPRIL) 40 MG tablet TAKE 1 TABLET BY MOUTH EVERY DAY 90 tablet 3   No current facility-administered medications for this visit.      Objective:  BP 130/68 (BP Location: Left Arm, Patient Position: Sitting, Cuff Size: Normal)   Pulse 70   Temp 99.1 F (37.3 C) (Oral)   Ht 5\' 8"  (1.727 m)   Wt 195 lb 3.2 oz (88.5 kg)   SpO2 97%   BMI 29.68 kg/m  Gen: NAD, resting comfortably CV: RRR no murmurs rubs or gallops Lungs: CTAB no crackles, wheeze, rhonchi Abdomen: soft/nontender even with deep palpation in left lower quadrant/nondistended/normal bowel sounds. No rebound or guarding.  Ext: no edema Skin: warm, dry MSK: Some tenderness in left upper thigh-no calf pain.  Some  medial joint line pain on left knee.  5 out of 5 strength in left and right lower extremity    Assessment and Plan   #Intermittent fevers/sinus pressure S: Patient treated with doxycycline last visit-he states he felt much better on this.Fever free for a week Headache gone for a week No night sweats for a week A/P: Has had significant improvement in symptoms after doxycycline-this points towards sinusitis as the cause of his issues.  We will need to reevaluate if recurrent-temperature was very mildly elevated at 99.1 today -No travel reported or exposure to covid-19   # Left leg pain/ LLQ pain (separate issue) S:[place] wom[place] basketball [place] last Friday Left leg started hurting him after walking a lot of stairs Has been a wearing a knee brace Seems to be getting better- not gone.  Pain 4-5/10.   Mild intermittent pain in left lower quadrant for perhaps 3 months. Mild pain this morning. Pain 2/10. About the same since it started. Bothers him about once a day- varies from 5 mins to 2 hours of pain but doesn't stop him from doing anything. Pushing/stretching doesn't affect it.   Has some constipation . Took dulcolax last night- BM this morning but small. A/P: Left leg pain seems like muscular strain-recommended BenGay as well as ice initially and then transition to heat.  No calf pain or edema in lower leg-doubt DVT  Left lower quadrant pain-benign abdominal exam today.  Intermittent issue.  Could be constipation related.  He agrees to follow-up if he has new or worsening symptoms. -Recently completed labs and do not feel strongly about repeating given pain was going on at time of labs.  We are repeating LFTs due to mild elevation but will postpone a few more weeks  Type 2 diabetes mellitus with diabetic chronic kidney disease (Eddie Ferguson) S: Well controlled without medication recently.  Updating diagnosis to reflect CKD stage III Lab Results  Component Value Date   HGBA1C 6.7 (H)  04/27/2018   A/P:  Stable. Continue current medications.    CKD (chronic kidney disease), stage III (HCC) S: GFR has been trending in the 50s.  Patient is on quinapril in case proteinuric element. A/P: Stable on recent labs-continue to monitor at least every 6 to 12 months  Essential hypertension S: controlled on carvedilol 12.5mg  BID, diltiazem 180mg  XL, quinapril 40mg  BP Readings from Last 3 Encounters:  05/16/18 130/68  05/10/18 136/72  04/27/18 122/68  A/P:  Stable. Continue current medications.      Future Appointments  Date Time Provider [place]  08/15/2018  8:15 AM Gardiner Barefoot, DPM [facility]  12/17/2018  8:30 AM Delice Lesch Lezlie Octave, MD [facility] None   Lab/Order  associations: Type 2 diabetes mellitus with stage 3 chronic kidney disease, without long-term current use of insulin (HCC)  CKD (chronic kidney disease), stage III ([place])  Essential hypertension  Return precautions advised.  Garret Reddish, MD

## 2018-05-16 NOTE — Assessment & Plan Note (Signed)
S: GFR has been trending in the 50s.  Patient is on quinapril in case proteinuric element. A/P: Stable on recent labs-continue to monitor at least every 6 to 12 months

## 2018-05-24 ENCOUNTER — Other Ambulatory Visit: Payer: Self-pay

## 2018-05-24 ENCOUNTER — Ambulatory Visit (INDEPENDENT_AMBULATORY_CARE_PROVIDER_SITE_OTHER): Payer: Medicare HMO | Admitting: Family Medicine

## 2018-05-24 ENCOUNTER — Encounter: Payer: Self-pay | Admitting: Family Medicine

## 2018-05-24 ENCOUNTER — Ambulatory Visit (INDEPENDENT_AMBULATORY_CARE_PROVIDER_SITE_OTHER)
Admission: RE | Admit: 2018-05-24 | Discharge: 2018-05-24 | Disposition: A | Discharge status: Home / Self Care | Payer: Medicare HMO | Source: Ambulatory Visit | Attending: Family Medicine | Admitting: Family Medicine

## 2018-05-24 ENCOUNTER — Ambulatory Visit (INDEPENDENT_AMBULATORY_CARE_PROVIDER_SITE_OTHER): Payer: Medicare HMO

## 2018-05-24 VITALS — BP 110/70 | HR 63 | Temp 97.9°F | Ht 68.0 in | Wt 194.0 lb

## 2018-05-24 DIAGNOSIS — M545 Low back pain, unspecified: Secondary | ICD-10-CM

## 2018-05-24 DIAGNOSIS — M79652 Pain in left thigh: Secondary | ICD-10-CM

## 2018-05-24 DIAGNOSIS — C61 Malignant neoplasm of prostate: Secondary | ICD-10-CM | POA: Diagnosis not present

## 2018-05-24 DIAGNOSIS — K5790 Diverticulosis of intestine, part unspecified, without perforation or abscess without bleeding: Secondary | ICD-10-CM | POA: Diagnosis not present

## 2018-05-24 DIAGNOSIS — R1032 Left lower quadrant pain: Secondary | ICD-10-CM | POA: Diagnosis not present

## 2018-05-24 DIAGNOSIS — M1712 Unilateral primary osteoarthritis, left knee: Secondary | ICD-10-CM | POA: Diagnosis not present

## 2018-05-24 LAB — CBC WITH DIFFERENTIAL/PLATELET
BASOS PCT: 0.9 % (ref 0.0–3.0)
Basophils Absolute: 0.1 10*3/uL (ref 0.0–0.1)
Eosinophils Absolute: 0.2 10*3/uL (ref 0.0–0.7)
Eosinophils Relative: 3.1 % (ref 0.0–5.0)
HCT: 41 % (ref 39.0–52.0)
Hemoglobin: 14.3 g/dL (ref 13.0–17.0)
Lymphocytes Relative: 14.2 % (ref 12.0–46.0)
Lymphs Abs: 1 10*3/uL (ref 0.7–4.0)
MCHC: 34.8 g/dL (ref 30.0–36.0)
MCV: 92.8 fl (ref 78.0–100.0)
Monocytes Absolute: 0.7 10*3/uL (ref 0.1–1.0)
Monocytes Relative: 10.2 % (ref 3.0–12.0)
Neutro Abs: 5 10*3/uL (ref 1.4–7.7)
Neutrophils Relative %: 71.6 % (ref 43.0–77.0)
Platelets: 177 10*3/uL (ref 150.0–400.0)
RBC: 4.42 Mil/uL (ref 4.22–5.81)
RDW: 14.6 % (ref 11.5–15.5)
WBC: 6.9 10*3/uL (ref 4.0–10.5)

## 2018-05-24 LAB — POC URINALSYSI DIPSTICK (AUTOMATED)
BILIRUBIN UA: NEGATIVE
Blood, UA: NEGATIVE
Glucose, UA: NEGATIVE
KETONES UA: NEGATIVE
Leukocytes, UA: NEGATIVE
Nitrite, UA: NEGATIVE
Protein, UA: NEGATIVE
Spec Grav, UA: 1.015 (ref 1.010–1.025)
Urobilinogen, UA: 0.2 E.U./dL
pH, UA: 6 (ref 5.0–8.0)

## 2018-05-24 LAB — COMPREHENSIVE METABOLIC PANEL
ALT: 24 U/L (ref 0–53)
AST: 23 U/L (ref 0–37)
Albumin: 4.3 g/dL (ref 3.5–5.2)
Alkaline Phosphatase: 64 U/L (ref 39–117)
BUN: 12 mg/dL (ref 6–23)
CO2: 34 mEq/L — ABNORMAL HIGH (ref 19–32)
Calcium: 9.3 mg/dL (ref 8.4–10.5)
Chloride: 91 mEq/L — ABNORMAL LOW (ref 96–112)
Creatinine, Ser: 1.05 mg/dL (ref 0.40–1.50)
GFR: 67.96 mL/min (ref >60.00)
GLUCOSE: 144 mg/dL — AB (ref 70–99)
Potassium: 2.9 mEq/L — ABNORMAL LOW (ref 3.5–5.1)
Sodium: 135 mEq/L (ref 135–145)
Total Bilirubin: 0.7 mg/dL (ref 0.2–1.2)
Total Protein: 6.2 g/dL (ref 6.0–8.3)

## 2018-05-24 LAB — PSA: PSA: 0.09 ng/mL — AB (ref 0.10–4.00)

## 2018-05-24 MED ORDER — IOHEXOL 300 MG/ML  SOLN
100.0000 mL | Freq: Once | INTRAMUSCULAR | Status: AC | PRN
  Administered 2018-05-24: 100 mL via INTRAVENOUS

## 2018-05-24 NOTE — Progress Notes (Addendum)
Phone [phone]   Subjective:  Eddie Ferguson is a 78 y.o. year old very pleasant male patient who presents for/with See problem oriented charting ROS-no unintentional weight loss.  No recent fevers.  No nausea or vomiting reported.  No chest pain or shortness of breath.  Past Medical History-  Patient Active Problem List   Diagnosis Date Noted  . Mild cognitive impairment 06/16/2015    Priority: High  . Prostate cancer ([place]) 05/28/2014    Priority: High  . Type 2 diabetes mellitus with diabetic chronic kidney disease ([place]) 04/23/2014    Priority: High  . CKD (chronic kidney disease), stage III ([place]) 04/18/2017    Priority: Medium  . Gout 07/09/2015    Priority: Medium  . Essential hypertension 04/23/2014    Priority: Medium  . Hyperlipidemia 04/23/2014    Priority: Medium  . Hypothyroidism 04/23/2014    Priority: Medium  . Stomach ulcer     Priority: Medium  . Adenomatous colon polyp 05/28/2014    Priority: Low  . Osteoarthritis 05/28/2014    Priority: Low  . Erectile dysfunction 05/28/2014    Priority: Low  . Basal cell carcinoma 05/28/2014    Priority: Low  . GERD (gastroesophageal reflux disease) 04/23/2014    Priority: Low  . Allergic rhinitis 04/23/2014    Priority: Low  . Glaucoma 04/23/2014    Priority: Low  . Aortic atherosclerosis ([place]) 01/05/2018  . Mild dementia ([place]) 09/26/2017  . History of nonmelanoma skin cancer 11/18/2012  . History of shingles 04/24/2011    Medications- reviewed and updated Current Outpatient Medications  Medication Sig Dispense Refill  . acetaminophen (TYLENOL) 500 MG tablet Take 500 mg by mouth every 6 (six) hours as needed. Reported on 06/16/2015    . allopurinol (ZYLOPRIM) 100 MG tablet Take 1 tablet (100 mg total) by mouth 2 (two) times daily. 180 tablet 1  . amLODipine (NORVASC) 5 MG tablet TAKE 1 TABLET BY MOUTH EVERY DAY 90 tablet 1  . carvedilol (COREG) 25 MG tablet Take 0.5 tablets (12.5 mg total) by mouth 2  (two) times daily with a meal. 180 tablet 3  . chlorthalidone (HYGROTON) 25 MG tablet Take 0.5-1 tablets (12.5-25 mg total) by mouth daily. 90 tablet 2  . Cholecalciferol (VITAMIN D) 2000 UNITS CAPS Take by mouth daily.      Marland Kitchen donepezil (ARICEPT) 10 MG tablet Take 1 tablet (10 mg total) by mouth daily. 90 tablet 3  . fexofenadine (ALLEGRA) 180 MG tablet Take 180 mg by mouth.    . fluticasone (FLONASE) 50 MCG/ACT nasal spray Place 2 sprays into both nostrils daily. 48 g 3  . glucose blood (FREESTYLE LITE) test strip Use to check blood sugars daily. Dx: E11.9 100 each 12  . KLOR-CON M20 20 MEQ tablet TAKE 2 TABLETS BY MOUTH 2 TIMES DAILY. 360 tablet 1  . latanoprost (XALATAN) 0.005 % ophthalmic solution Place 1 drop into both eyes at bedtime.     Marland Kitchen levothyroxine (SYNTHROID, LEVOTHROID) 50 MCG tablet TAKE 1 TABLET BY MOUTH EVERY DAY 90 tablet 3  . methocarbamol (ROBAXIN) 750 MG tablet Take 1 tablet (750 mg total) by mouth every 8 (eight) hours as needed for muscle spasms. 60 tablet 3  . Multiple Vitamin (MULTIVITAMIN) tablet Take 1 tablet by mouth daily.      . Omega-3 Fatty Acids (FISH OIL) 1200 MG CAPS Take by mouth daily.      . pravastatin (PRAVACHOL) 40 MG tablet TAKE 1 TABLET BY MOUTH EVERY DAY  90 tablet 3  . quinapril (ACCUPRIL) 40 MG tablet TAKE 1 TABLET BY MOUTH EVERY DAY 90 tablet 3   No current facility-administered medications for this visit.      Objective:  BP 110/70 (BP Location: Left Arm, Patient Position: Sitting, Cuff Size: Large)   Pulse 63   Temp 97.9 F (36.6 C) (Oral)   Ht 5\' 8"  (1.727 m)   Wt 194 lb (88 kg)   SpO2 97%   BMI 29.50 kg/m  Gen: NAD, resting comfortably CV: RRR no murmurs rubs or gallops Lungs: CTAB no crackles, wheeze, rhonchi Abdomen: soft/patient tender to palpation in left lower quadrant with deep palpation/nondistended/normal bowel sounds. No rebound or guarding.  Ext: no edema Skin: warm, dry MSK: Good hip range of motion without pain.  During  [place] testing-patient reports pain which prevents him from giving good effort.  He also struggles to get onto the table due to pain.    Assessment and Plan    Prostate cancer ([place]) - Plan: PSA  Left lower quadrant abdominal pain - Plan: Comprehensive metabolic panel, CT Abdomen Pelvis W Contrast, POCT Urinalysis Dipstick (Automated), CBC with Differential/Platelet  Left thigh pain - Plan: DG FEMUR MIN 2 VIEWS LEFT  Acute bilateral low back pain, unspecified whether sciatica present S: Patient has been dealing with back pain for about 2 weeks.  Denies urinary symptoms other than frequency starting beginning of 2020.   Has some lower abdominal pain.He reported this 8 days ago-intermittent left lower quadrant pain for 3 months-about a 2 out of 10.  Bothers him about once a day varying from 5 minutes to 2 hours.  Pushing on area or stretching does not seem make pain worse.  Does deal with some constipation. Since last visit has increased to 4-5/10.   Dealing with insomnia- pain seems to be keeping him up at night. Gets up and paces. Pains seem to be diffuse in different areas- may be the back, feet, ankle, groin is actually doing better for now but still some pain- thought he had a strain previously.   Traveled to [place] last 2 days- sister had a stroke. No covid 19 contacts they are aware of while there- he didn't go up on ward though.    A/P: Worsening left lower quadrant pain, new low back pain though this could be arthritis related, left thigh pain-could be related to that - family asks if could be related to prostate cancer. Has follow up with Dr. Alinda Money in coming months. We will check PSA.  - will get lumbar x-ray to look for any obvious bony metastasis -With ongoing left thigh pain-we will get x-ray to evaluate for fracture or mass.  I will admit I am being overly aggressive in attempts to keep him from having to come back in the office again - will order CT scan given ongoing  pain in left lower abdomen now over 3 months and worsening at this point though no signs of acute abdomen -Has urology visit early next month for rectal exam. PSA's have been low per family report -Should return for new or worsening symptoms-if evaluation is reassuring may be reasonable to monitor for a few more weeks    Future Appointments  Date Time Provider [place]  08/15/2018  8:15 AM Gardiner Barefoot, DPM [facility]  12/17/2018  8:30 AM Cameron Sprang, MD [facility] None   Lab/Order associations: Prostate cancer [facility]) - Plan: PSA  Left lower quadrant abdominal pain - Plan: Comprehensive metabolic panel,  CT Abdomen Pelvis W Contrast, POCT Urinalysis Dipstick (Automated), CBC with Differential/Platelet  Left thigh pain - Plan: DG FEMUR MIN 2 VIEWS LEFT  Acute bilateral low back pain, unspecified whether sciatica present   Time Stamp The duration of face-to-face time during this visit was greater than 25 minutes. Greater than 50% of this time was spent in counseling, explanation of diagnosis, planning of further management, and/or coordination of care including discussing concern about recurrent prostate cancer, discussing potential causes, discussing work-up, discussing being more aggressive about work-up and efforts to keep patient out of the office.    Return precautions advised.  Garret Reddish, MD

## 2018-05-24 NOTE — Addendum Note (Signed)
Addended by: Francis Dowse T on: 05/24/2018 09:35 AM   Modules accepted: Orders

## 2018-05-24 NOTE — Patient Instructions (Addendum)
Sit tight in room- they will come in about CT scan  Then Please stop by lab and x-ray before you go If you do not have mychart- we will call you about results within 5 business days of [place] receiving them.  If you have mychart- we will send your results within 3 business days of [place] receiving them.  If abnormal or we want to clarify a result, we will call or mychart you to make sure you receive the message.  If you have questions or concerns or don't hear within 5-7 days, please send [place] a message or call us.

## 2018-05-25 ENCOUNTER — Encounter: Payer: Self-pay | Admitting: Family Medicine

## 2018-05-29 ENCOUNTER — Ambulatory Visit: Payer: Medicare HMO | Admitting: Family Medicine

## 2018-06-04 ENCOUNTER — Telehealth: Payer: Self-pay | Admitting: Family Medicine

## 2018-06-04 NOTE — Telephone Encounter (Signed)
[facility] Patient Name: Eddie Ferguson Gender: Male DOB: 09-02-1940  Age: 78 Y 8 D Return Phone Number: [phone] (Primary), [phone] (Secondary) Address:  [place]  [address] Client [facility] Night Physician Garret Reddish- MD Contact Type Call Who Is Calling Patient / Member / Family / Caregiver Call Type Triage / Clinical Caller Name Noralee Chars Relationship To Patient Spouse Return Phone Number [phone] (Primary) Chief Complaint Pain - Generalized Reason for Call Symptomatic / Request for Health Information Initial Comment The pt. is experiencing overall pain. The pt. has dementia. He has been seen for pain all over his body before. The cream they used is no longer helping his pain. He has insomnia and is in constant discomfort. No other symptoms were reported. Translation No Nurse Assessment Nurse: Loreen Freud, RN, [place] Date/Time (Eastern Time): 06/02/2018 9:28:41 AM Confirm and document reason for call. If symptomatic, describe symptoms. ---Caller states patient has insomnia and is in constant discomfort, is experiencing overall pain. He has dementia. He has been seen for pain all over his body before and the cream they used is no longer helping his pain. No other symptoms were reported. He saw the doctor last week, everything seemed to come back normal. His pain has gotten much worse as the week progressed, he can not get comfortable, it hurts anywhere he is touched. Has the patient traveled to [place], [place], [place], [place], or [place] OR had close contact with a person known to have the novel coronavirus illness in the last 14 days? ---Not Applicable Does the patient have any new or worsening symptoms? ---Yes Will a triage be completed? ---Yes Related visit to physician  within the last 2 weeks? ---Yes Does the PT have any chronic conditions? (i.e. diabetes, asthma, this includes High risk factors for pregnancy, etc.) ---Yes List chronic conditions. ---Dementia, hypertension Is this a behavioral health or substance abuse call? ---No PLEASE NOTE:  All timestamps contained within this report are represented as [place] Standard Time. CONFIDENTIALTY NOTICE: This fax transmission is intended only for the addressee.  It contains information that is legally privileged, confidential or otherwise protected from use or disclosure.  If you are not the intended recipient, you are strictly prohibited from reviewing, disclosing, copying using or disseminating any of this information or taking any action in reliance on or regarding this information.  If you have received this fax in error, please notify us immediately by telephone so that we can arrange for its return to [place]. Phone:  [phone], Toll-Free:  [phone], Fax:  [phone] Page: 2 of 2 Call Id: [id number] Guidelines Guideline Title Affirmed Question Affirmed Notes Nurse Date/Time Eilene Ghazi Time) Muscle Aches and Body Pain Difficult to awaken or acting confused (e.g., disoriented, slurred speech)  [place], RN, [place] 06/02/2018 9:34:49 AM Disp. Time Eilene Ghazi Time) Disposition Final User 06/02/2018 9:45:13 AM 911 Outcome Documentation Depew, RN, [place] Reason: Caller has not called yet, she had to help him use the restroom. 06/02/2018 9:49:11 AM 911 Outcome Documentation Depew, RN, Crystal Reason: No answer on second follow-up call. 06/02/2018 9:40:38 AM Call EMS 911 Now Yes Depew, RN, Crystal     Caller Disagree/Comply Comply Caller Understands Yes PreDisposition InappropriateToAsk Care Advice Given Per Guideline CALL EMS 911 NOW: * Immediate medical attention is needed. You need to hang up and call 911 (or an ambulance). * Mudlogger  Discretion: I'll call you back in a few minutes to be sure you were able to  reach them. CARE ADVICE given per Muscle Aches and Body Pain (Adult) guideline. Comments User: Willette Brace, RN Date/Time (Eastern Time): 06/02/2018 9:40:37 AM Caller states her husband is not having more confusion but is having trouble expressing himself verball

## 2018-06-05 NOTE — Telephone Encounter (Signed)
Probably needs another webex

## 2018-06-05 NOTE — Telephone Encounter (Signed)
FYI Per phone note "Caller states patient has insomnia and is in constant discomfort, is experiencing overall pain. He has dementia. He has been seen for pain all over his body before and the cream they used is no longer helping his pain. No other symptoms were reported. He saw the doctor last week, everything seemed to come back normal. His pain has gotten much worse as the week progressed, he can not get comfortable, it hurts anywhere he is touched."  Spoke to [place], pt wife and she stated that they sent pt home from the ED no one could understand pt. They took the pt vitals and stated that he was okay per vitals. They stated that the risk of pt being in the ED was to high and wanted pt to go home. Kris Mouton stated that they agreed and went home.

## 2018-06-06 ENCOUNTER — Ambulatory Visit (INDEPENDENT_AMBULATORY_CARE_PROVIDER_SITE_OTHER): Payer: Medicare HMO | Admitting: Family Medicine

## 2018-06-06 ENCOUNTER — Encounter: Payer: Self-pay | Admitting: Family Medicine

## 2018-06-06 NOTE — Progress Notes (Signed)
Patient cancelled visit

## 2018-06-06 NOTE — Telephone Encounter (Signed)
Pt has webex visit today.

## 2018-06-06 NOTE — Patient Instructions (Signed)
There are no preventive care reminders to display for this patient.  Depression screen [facility] 2/9 04/27/2018 06/08/2017 04/18/2017  Decreased Interest 0 0 0  Down, Depressed, Hopeless 0 - 0  PHQ - 2 Score 0 0 0

## 2018-06-07 ENCOUNTER — Other Ambulatory Visit: Payer: Self-pay | Admitting: Family Medicine

## 2018-07-02 ENCOUNTER — Telehealth: Payer: Self-pay | Admitting: Family Medicine

## 2018-07-02 NOTE — Telephone Encounter (Signed)
Talked to the patient wife she said they have already tried the virtual visit and there computer would not work. Patient wife would rather come in the office so husband can be checked out.  Please Advise.

## 2018-07-02 NOTE — Telephone Encounter (Signed)
Yes thanks- in person visit- prefer last visit of the morning as the flow for in person visits is far different than video visits

## 2018-07-02 NOTE — Telephone Encounter (Signed)
Make sure no covid 19 symptoms once again when patient does come in

## 2018-07-02 NOTE — Telephone Encounter (Signed)
Copied from [place] [phone]. Topic: General - Other >> Jul 02, 2018 12:37 PM Leward Quan A wrote: Reason for TOI:[id number] wife called to say that he is having some mobility issues and would like for him to come in and see doctor Hunter. Please call  Ph# [phone]

## 2018-07-03 NOTE — Telephone Encounter (Signed)
FYI Spoke to [place] and she stated the pt I getting worse. [name] claimed pt wok up in the middle of the night crying from leg pain. I advised [name] that we can get pt in today to be seen but [name] declined. She stated that she has an 11 am appt and cannot do anything else today. [name] stated that pt was refusing to come in yesterday due to Covid-19. Pt has been scheduled for tomorrow. [name] stated that the pt was sleeping and will inform him of the visit later. She stated that if pt declines again she will call back and cancel appt.

## 2018-07-03 NOTE — Telephone Encounter (Signed)
Thanks for update- glad they have agreed to visit

## 2018-07-04 ENCOUNTER — Ambulatory Visit (INDEPENDENT_AMBULATORY_CARE_PROVIDER_SITE_OTHER): Payer: Medicare HMO | Admitting: Family Medicine

## 2018-07-04 ENCOUNTER — Encounter: Payer: Self-pay | Admitting: Family Medicine

## 2018-07-04 VITALS — BP 125/55 | HR 66 | Temp 98.5°F | Ht 68.0 in | Wt 190.0 lb

## 2018-07-04 DIAGNOSIS — M79605 Pain in left leg: Secondary | ICD-10-CM

## 2018-07-04 DIAGNOSIS — G8929 Other chronic pain: Secondary | ICD-10-CM

## 2018-07-04 DIAGNOSIS — M5442 Lumbago with sciatica, left side: Secondary | ICD-10-CM | POA: Diagnosis not present

## 2018-07-04 NOTE — Progress Notes (Signed)
Phone [phone]   Subjective:  Virtual visit via phonenote This visit type was conducted due to national recommendations for restrictions regarding the COVID-19 Pandemic (e.g. social distancing).  This format is felt to be most appropriate for this patient at this time balancing risks to patient and risks to population by having him in for in person visit.  All issues noted in this document were discussed and addressed.  No physical exam was performed (except for noted visual exam or audio findings with Telehealth visits).  The patient has consented to conduct a Telehealth visit and understands insurance will be billed.   Our team/I connected with Eddie Ferguson on 07/04/18 at 11:20 AM EDT by phone (patient did not have equipment for webex) and verified that I am speaking with the correct person using two identifiers.  Location patient: [facility] Location provider: [facility], office Persons participating in the virtual visit:  Patient, wife provides history as well  Time on phone: 11 minutes Counseling provided about covid 19, options for workup for hip/back pain  Our team/I discussed the limitations of evaluation and management by telemedicine and the availability of in person appointments. In light of current covid-19 pandemic, patient also understands that we are trying to protect them by minimizing in office contact if at all possible.  The patient expressed consent for telemedicine visit and agreed to proceed. Patient understands insurance will be billed.   ROS- no fever/chills/cough/sore throat/anosmia   Past Medical History-  Patient Active Problem List   Diagnosis Date Noted  . Mild cognitive impairment 06/16/2015    Priority: High  . Prostate cancer ([place]) 05/28/2014    Priority: High  . 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 CKD (chronic kidney disease), stage III ([place]) 04/18/2017    Priority: Medium  . Gout  07/09/2015    Priority: Medium  . Essential hypertension 04/23/2014    Priority: Medium  . Hyperlipidemia 04/23/2014    Priority: Medium  . Hypothyroidism 04/23/2014    Priority: Medium  . Stomach ulcer     Priority: Medium  . Adenomatous colon polyp 05/28/2014    Priority: Low  . Osteoarthritis 05/28/2014    Priority: Low  . Erectile dysfunction 05/28/2014    Priority: Low  . Basal cell carcinoma 05/28/2014    Priority: Low  . GERD (gastroesophageal reflux disease) 04/23/2014    Priority: Low  . Allergic rhinitis 04/23/2014    Priority: Low  . Glaucoma 04/23/2014    Priority: Low  . Aortic atherosclerosis ([place]) 01/05/2018  . Mild dementia ([place]) 09/26/2017  . History of nonmelanoma skin cancer 11/18/2012  . History of shingles 04/24/2011    Medications- reviewed and updated Current Outpatient Medications  Medication Sig Dispense Refill  . allopurinol (ZYLOPRIM) 100 MG tablet Take 1 tablet (100 mg total) by mouth 2 (two) times daily. 180 tablet 1  . amLODipine (NORVASC) 5 MG tablet TAKE 1 TABLET BY MOUTH EVERY DAY 90 tablet 1  . carvedilol (COREG) 25 MG tablet Take 0.5 tablets (12.5 mg total) by mouth 2 (two) times daily with a meal. 180 tablet 3  . chlorthalidone (HYGROTON) 25 MG tablet TAKE 1/2-1 TABLET BY MOUTH DAILY. 90 tablet 1  . Cholecalciferol (VITAMIN D) 2000 UNITS CAPS Take by mouth daily.      Marland Kitchen donepezil (ARICEPT) 10 MG tablet Take 1 tablet (10 mg total) by mouth daily. 90 tablet 3  . fexofenadine (ALLEGRA) 180 MG tablet  Take 180 mg by mouth.    . fluticasone (FLONASE) 50 MCG/ACT nasal spray Place 2 sprays into both nostrils daily. 48 g 3  . glucose blood (FREESTYLE LITE) test strip Use to check blood sugars daily. Dx: E11.9 100 each 12  . KLOR-CON M20 20 MEQ tablet TAKE 2 TABLETS BY MOUTH 2 TIMES DAILY. 360 tablet 1  . latanoprost (XALATAN) 0.005 % ophthalmic solution Place 1 drop into both eyes at bedtime.     Marland Kitchen levothyroxine (SYNTHROID, LEVOTHROID) 50 MCG  tablet TAKE 1 TABLET BY MOUTH EVERY DAY 90 tablet 3  . methocarbamol (ROBAXIN) 750 MG tablet Take 1 tablet (750 mg total) by mouth every 8 (eight) hours as needed for muscle spasms. 60 tablet 3  . Multiple Vitamin (MULTIVITAMIN) tablet Take 1 tablet by mouth daily.      . Omega-3 Fatty Acids (FISH OIL) 1200 MG CAPS Take by mouth daily.      . pravastatin (PRAVACHOL) 40 MG tablet TAKE 1 TABLET BY MOUTH EVERY DAY 90 tablet 3  . quinapril (ACCUPRIL) 40 MG tablet TAKE 1 TABLET BY MOUTH EVERY DAY 90 tablet 3  . acetaminophen (TYLENOL) 500 MG tablet Take 500 mg by mouth every 6 (six) hours as needed. Reported on 06/16/2015     No current facility-administered medications for this visit.      Objective:  BP (!) 125/55 (BP Location: Left Arm, Patient Position: Sitting, Cuff Size: Normal)   Pulse 66   Temp 98.5 F (36.9 C) (Oral)   Ht 5\' 8"  (1.727 m)   Wt 190 lb (86.2 kg)   BMI 28.89 kg/m  nonlabored voice Normal speech    Assessment and Plan   # Chronic left-sided low back pain with left-sided sciatica - Plan: Ambulatory referral to Orthopedics  Left leg pain - Plan: Ambulatory referral to Orthopedics S:patient states symptoms have been going on for 4-6 weeks. Describes pain in left low back going down into his left thigh. No calf pain.  Perhaps some groin pain. Has a salve that helps the back some- numbs it enough. Having to walk with a cane due to the pain. Feels unstable on the leg. Tylenol was not helpful- plus made him feel itchy A/P:  We did an x-ray last month of his leg which showed arthritis in both the knee and hip. We also did abdominal CT as was having pain at that time in lower abdomen- degenerative changes were noted in lumbar spine without blastic or lytic bone lesions.  Possibly issues could be coming from hip but I more strongly suspect low back as source of pain- may need MRI.   Given ongoing pain patient would like to further evaluate- he and his wife have asked for a  referral to  Dr. Ninfa Linden- [facility]- I think that's very appropriate and referral placed today. If due to covid 19- they cannot see him- then would consider referral to Dr. Tamala Julian of sports medicine  Future Appointments  Date Time Provider [place]  08/15/2018  8:15 AM Gardiner Barefoot, DPM [facility]  12/17/2018  8:30 AM Cameron Sprang, MD [facility] None   Lab/Order associations: Chronic left-sided low back pain with left-sided sciatica - Plan: Ambulatory referral to Orthopedics  Left leg pain - Plan: Ambulatory referral to Orthopedics  Return precautions advised.  Garret Reddish, MD

## 2018-07-04 NOTE — Patient Instructions (Addendum)
There are no preventive care reminders to display for this patient.  Depression screen [facility] 2/9 04/27/2018 06/08/2017 04/18/2017  Decreased Interest 0 0 0  Down, Depressed, Hopeless 0 - 0  PHQ - 2 Score 0 0 0   Phone visit

## 2018-07-12 ENCOUNTER — Ambulatory Visit: Payer: Medicare HMO | Admitting: Orthopaedic Surgery

## 2018-07-12 ENCOUNTER — Other Ambulatory Visit: Payer: Self-pay

## 2018-07-12 ENCOUNTER — Ambulatory Visit (INDEPENDENT_AMBULATORY_CARE_PROVIDER_SITE_OTHER): Payer: Medicare HMO

## 2018-07-12 ENCOUNTER — Encounter: Payer: Self-pay | Admitting: Orthopaedic Surgery

## 2018-07-12 DIAGNOSIS — M25551 Pain in right hip: Secondary | ICD-10-CM

## 2018-07-12 DIAGNOSIS — M7061 Trochanteric bursitis, right hip: Secondary | ICD-10-CM | POA: Diagnosis not present

## 2018-07-12 DIAGNOSIS — M5431 Sciatica, right side: Secondary | ICD-10-CM | POA: Diagnosis not present

## 2018-07-12 MED ORDER — DICLOFENAC SODIUM 1 % TD GEL: 2.0000 g | Freq: Four times a day (QID) | TRANSDERMAL | 3 refills | Status: DC

## 2018-07-12 MED ORDER — METHYLPREDNISOLONE 4 MG PO TABS: ORAL_TABLET | ORAL | 0 refills | Status: DC

## 2018-07-12 NOTE — Progress Notes (Signed)
Office Visit Note   Patient: Eddie Ferguson. Pfiester           Date of Birth: 28-Apr-1940           MRN: [id number] Visit Date: 07/12/2018              Requested by: Marin Olp, MD [place], [place] [id number] PCP: Marin Olp, MD   Assessment & Plan: Visit Diagnoses:  1. Pain in right hip   2. Sciatica, right side   3. Trochanteric bursitis, right hip     Plan: Certainly he may be having issues with left knee arthritis and right hip arthritis as well as a combination of sciatica and right trochanteric bursitis.  He was not interested in any type of injection of a steroid today in the trochanteric area or the issue area.  I then recommend at least a 6-day steroid taper and also seeing if I can send in some Voltaren gel to help with various areas that are bothering him from musculoskeletal standpoint.  All question concerns were answered and addressed.  His wife seemed to agree with this as well.  I would like to see him back in 2 weeks to see if he has had some relief from at least the 6-day steroid taper.  No x-rays are needed at the next visit.  Follow-Up Instructions: Return in about 2 weeks (around 07/26/2018).   Orders:  Orders Placed This Encounter  Procedures  . XR HIP UNILAT W OR W/O PELVIS 1V RIGHT   Meds ordered this encounter  Medications  . methylPREDNISolone (MEDROL) 4 MG tablet    Sig: Medrol dose pack. Take as instructed Taper steroid over 6 days.    Dispense:  21 tablet    Refill:  0  . diclofenac sodium (VOLTAREN) 1 % GEL    Sig: Apply 2 g topically 4 (four) times daily.    Dispense:  100 g    Refill:  3      Procedures: No procedures performed   Clinical Data: No additional findings.   Subjective: Chief Complaint  Patient presents with  . Lower Back - Pain  . Right Hip - Pain  The patient is a very pleasant 78 year old gentleman I am seeing for the first time.  He comes in with his wife due to the fact that he does have a  history of dementia.  He is still highly active and highly functioning.  He is seen his primary care physician over the last few months due to significant left lower extremity pain.  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  He then had a recent fall about a day ago injuring his right hip.  He complains of pain around the right hip and he points the sciatic area as well as the trochanteric area.  He has no other active acute medical issues.  I was able to look at a CT scan of his abdomen and pelvis that did show his back and his hips and this was done and March of this year.  We did order some x-rays of his pelvis and right hip today given his recent fall and his complaints.  HPI  Review of Systems He currently denies any headache, chest pain, shortness of breath, fever, chills, nausea, vomiting  Objective: Vital Signs: There were no vitals taken for this visit.  Physical Exam He is alert and orient x3 today in the office and in no acute distress.  He is very slow to mobilize and is using both a wheelchair and then a cane.  He is able to get up on the exam table slowly though. Ortho Exam Examination of his left hip is normal examination of his right hip shows just some slight pain with internal and external rotation and some of this is in the groin and some of this is over the trochanteric area.  He also has pain to palpation over the trochanteric area but more so over his right issue him and sciatic region.  He does have some grinding in his knees on exam.  There is no gross weakness in his lower extremities. Specialty Comments:  No specialty comments available.  Imaging: Xr Hip Unilat W Or W/o Pelvis 1v Right  Result Date: 07/12/2018 An AP pelvis and lateral of the right hip shows no acute changes.  There is moderate  osteoarthritis of the right hip comparing the right and left hips with joint space narrowing and sclerotic changes as well as para-articular osteophytes on the right hip.    PMFS History: Patient Active Problem List   Diagnosis Date Noted  . Aortic atherosclerosis ([place]) 01/05/2018  . Mild dementia ([place]) 09/26/2017  . CKD (chronic kidney disease), stage III ([place]) 04/18/2017  . Gout 07/09/2015  . Mild cognitive impairment 06/16/2015  . Adenomatous colon polyp 05/28/2014  . Prostate cancer ([place]) 05/28/2014  . Osteoarthritis 05/28/2014  . Erectile dysfunction 05/28/2014  . Basal cell carcinoma 05/28/2014  . Essential hypertension 04/23/2014  . Hyperlipidemia 04/23/2014  . GERD (gastroesophageal reflux disease) 04/23/2014  . 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 Hypothyroidism 04/23/2014  . Allergic rhinitis 04/23/2014  . Glaucoma 04/23/2014  . Stomach ulcer   . History of nonmelanoma skin cancer 11/18/2012  . History of shingles 04/24/2011   Past Medical History:  Diagnosis Date  . Allergy   . Ankle injury 06/01/2001   no surgery- skin debridement   . Arthritis   . Basal cell carcinoma 05/28/2014   L nasal tip. Mohs Dr. Link Snuffer.    . Blood transfusion    2010 because of stomach ulcers  . Cataract    bilaterally removed  . Diabetes mellitus   . GERD (gastroesophageal reflux disease)   . Glaucoma   . Hyperlipidemia   . Hypertension   . Hypothyroidism   . Pneumonia 04/12/2006  . Prostate cancer ([place])   . Seasonal allergies   . Sinusitis    treated for bacterial infection at least once a year at [facility]  . Stomach ulcer 2010   bleeding ulcer result NSAIDS    Family History  Problem Relation Age of Onset  . Diabetes Mother   . Diabetes Father   . Hyperlipidemia Father   . Colon polyps Father   . Brain cancer Brother        smoker  . Dementia Paternal Grandmother   . Colon cancer Neg Hx   . Esophageal cancer Neg Hx   .  Stomach cancer Neg Hx   . Rectal cancer Neg Hx     Past Surgical History:  Procedure Laterality Date  . abdominl abcess     03-15-2011  . BACK SURGERY    . CATARACT EXTRACTION, BILATERAL     04-07-2014, 03-17-2014  . COLONOSCOPY  2012, 5 year repeat  . CYSTOURETHROSCOPY  11/11/1998  . ESOPHAGOGASTRODUODENOSCOPY  2010   [facility]  . LUMBAR LAMINECTOMY  2007   [place]  . MOHS SURGERY     left side of nose  . POLYPECTOMY    . PROSTATE BIOPSY     radiation 2016  . RECONSTRUCTION TENDON PULLEY W/ TENDON / FASCIAL GRAFT OF HAND / FINGER  1982   rt 3rd finger  . RECONSTRUCTION TENDON PULLEY W/ TENDON / FASCIAL GRAFT OF HAND / FINGER  1953   rt 5th finger  . UPPER GASTROINTESTINAL ENDOSCOPY     Social History   Occupational History  . Not on file  Tobacco Use  . Smoking status: Former Smoker    Last attempt to quit: 02/03/2001    Years since quitting: 17.4  . Smokeless tobacco: Never Used  . Tobacco comment: intermittent cigar use stopped  Substance and Sexual Activity  . Alcohol use: Yes    Alcohol/week: 0.0 standard drinks    Comment: occasional beer  . Drug use: No  . Sexual activity: Not on file

## 2018-07-29 ENCOUNTER — Other Ambulatory Visit: Payer: Self-pay | Admitting: Family Medicine

## 2018-07-31 ENCOUNTER — Ambulatory Visit: Payer: Medicare HMO | Admitting: Orthopaedic Surgery

## 2018-08-11 ENCOUNTER — Other Ambulatory Visit: Payer: Self-pay | Admitting: Family Medicine

## 2018-08-15 ENCOUNTER — Ambulatory Visit: Payer: Medicare HMO | Admitting: Podiatry

## 2018-08-30 ENCOUNTER — Other Ambulatory Visit: Payer: Self-pay | Admitting: Family Medicine

## 2018-10-09 DIAGNOSIS — H353111 Nonexudative age-related macular degeneration, right eye, early dry stage: Secondary | ICD-10-CM | POA: Diagnosis not present

## 2018-10-09 DIAGNOSIS — H401132 Primary open-angle glaucoma, bilateral, moderate stage: Secondary | ICD-10-CM | POA: Diagnosis not present

## 2018-10-09 DIAGNOSIS — H524 Presbyopia: Secondary | ICD-10-CM | POA: Diagnosis not present

## 2018-10-09 DIAGNOSIS — H52221 Regular astigmatism, right eye: Secondary | ICD-10-CM | POA: Diagnosis not present

## 2018-10-09 DIAGNOSIS — H353121 Nonexudative age-related macular degeneration, left eye, early dry stage: Secondary | ICD-10-CM | POA: Diagnosis not present

## 2018-10-09 DIAGNOSIS — H5212 Myopia, left eye: Secondary | ICD-10-CM | POA: Diagnosis not present

## 2018-10-09 DIAGNOSIS — E119 Type 2 diabetes mellitus without complications: Secondary | ICD-10-CM | POA: Diagnosis not present

## 2018-10-23 DIAGNOSIS — Z8546 Personal history of malignant neoplasm of prostate: Secondary | ICD-10-CM | POA: Diagnosis not present

## 2018-10-31 DIAGNOSIS — Z8546 Personal history of malignant neoplasm of prostate: Secondary | ICD-10-CM | POA: Diagnosis not present

## 2018-11-29 ENCOUNTER — Other Ambulatory Visit: Payer: Self-pay | Admitting: Family Medicine

## 2018-12-04 ENCOUNTER — Other Ambulatory Visit: Payer: Self-pay | Admitting: Family Medicine

## 2018-12-17 ENCOUNTER — Ambulatory Visit (INDEPENDENT_AMBULATORY_CARE_PROVIDER_SITE_OTHER): Payer: Medicare HMO | Admitting: Neurology

## 2018-12-17 ENCOUNTER — Other Ambulatory Visit: Payer: Self-pay

## 2018-12-17 ENCOUNTER — Encounter: Payer: Self-pay | Admitting: Neurology

## 2018-12-17 DIAGNOSIS — F03A0 Unspecified dementia, mild, without behavioral disturbance, psychotic disturbance, mood disturbance, and anxiety: Secondary | ICD-10-CM

## 2018-12-17 DIAGNOSIS — F0390 Unspecified dementia without behavioral disturbance: Secondary | ICD-10-CM

## 2018-12-17 DIAGNOSIS — R69 Illness, unspecified: Secondary | ICD-10-CM | POA: Diagnosis not present

## 2018-12-17 MED ORDER — DONEPEZIL HCL 10 MG PO TABS: 10.0000 mg | ORAL_TABLET | Freq: Every day | ORAL | 3 refills | Status: DC

## 2018-12-17 NOTE — Patient Instructions (Signed)
Great seeing you! Continue Donepezil 10mg  daily. Follow-up in 6 months, call for any changes.  FALL PRECAUTIONS: Be cautious when walking. Scan the area for obstacles that may increase the risk of trips and falls. When getting up in the mornings, sit up at the edge of the bed for a few minutes before getting out of bed. Consider elevating the bed at the head end to avoid drop of blood pressure when getting up. Walk always in a well-lit room (use night lights in the walls). Avoid area rugs or power cords from appliances in the middle of the walkways. Use a walker or a cane if necessary and consider physical therapy for balance exercise. Get your eyesight checked regularly.  FINANCIAL OVERSIGHT: Supervision, especially oversight when making financial decisions or transactions is also recommended.  HOME SAFETY: Consider the safety of the kitchen when operating appliances like stoves, microwave oven, and blender. Consider having supervision and share cooking responsibilities until no longer able to participate in those. Accidents with firearms and other hazards in the house should be identified and addressed as well.  ABILITY TO BE LEFT ALONE: If patient is unable to contact 911 operator, consider using LifeLine, or when the need is there, arrange for someone to stay with patients. Smoking is a fire hazard, consider supervision or cessation. Risk of wandering should be assessed by caregiver and if detected at any point, supervision and safe proof recommendations should be instituted.  MEDICATION SUPERVISION: Inability to self-administer medication needs to be constantly addressed. Implement a mechanism to ensure safe administration of the medications.  RECOMMENDATIONS FOR ALL PATIENTS WITH MEMORY PROBLEMS: 1. Continue to exercise (Recommend 30 minutes of walking everyday, or 3 hours every week) 2. Increase social interactions - continue going to [place] and enjoy social gatherings with friends and  family 3. Eat healthy, avoid fried foods and eat more fruits and vegetables 4. Maintain adequate blood pressure, blood sugar, and blood cholesterol level. Reducing the risk of stroke and cardiovascular disease also helps promoting better memory. 5. Avoid stressful situations. Live a simple life and avoid aggravations. Organize your time and prepare for the next day in anticipation. 6. Sleep well, avoid any interruptions of sleep and avoid any distractions in the bedroom that may interfere with adequate sleep quality 7. Avoid sugar, avoid sweets as there is a strong link between excessive sugar intake, diabetes, and cognitive impairment The Mediterranean diet has been shown to help patients reduce the risk of progressive memory disorders and reduces cardiovascular risk. This includes eating fish, eat fruits and green leafy vegetables, nuts like almonds and hazelnuts, walnuts, and also use olive oil. Avoid fast foods and fried foods as much as possible. Avoid sweets and sugar as sugar use has been linked to worsening of memory function.  There is always a concern of gradual progression of memory problems. If this is the case, then we may need to adjust level of care according to patient needs. Support, both to the patient and caregiver, should then be put into place.

## 2018-12-17 NOTE — Progress Notes (Signed)
NEUROLOGY FOLLOW UP OFFICE NOTE  Eddie Ferguson. Eddie Ferguson 09/11/1940  HISTORY OF PRESENT ILLNESS: I had the pleasure of seeing Eddie Ferguson in follow-up in the neurology clinic on 12/17/2018.  The patient was last seen 7 months ago for mild dementia. He is again accompanied by his wife who helps supplement the history today. MOCA score 22/30 in March 2020 (23/30 in July 2019, 24/30 in January 2019). He is on Donepezil 10mg  daily. Since his last visit, he reports his short-term memory is not too good. His wife has noticed it is diminishing, but he is still pretty sharp. He continues to manage his own medications and bills, he is very methodical. He has decided to stop driving on his own after he turned 43. His wife has noticed he is sharper in the morning and gets less sharp after lunch, but no confusion. No mood changes, paranoia or hallucinations. No hygiene concerns, he is independent with dressing and bathing. Sleep is good, no wandering behavior. He denies any headaches, dizziness, vision changes, no falls. His legs feel weak, his feet are bad (nothing new), and he has a different sensation in his fingers. He uses a walker at home but a cane outside.   History on Initial Assessment 03/27/2017: This is a very pleasant 78 year old right-handed man with a history of hypertension, hyperlipidemia, diabetes, hypothyroidism, presenting for evaluation of short-term memory loss. He and his wife report changes started around a year ago. He would not be able to recall where he ate the day prior. His wife reports he repeats himself and asks the same questions several times. He used to be accurate and precise, and has a very detailed spreadsheet of his medications (has similar one for bills), but now has difficulty using his computer. He is better in the morning, but later in the afternoon, he cannot recall how he set up a worksheet or where it is in his files. He makes wrong turns while driving, but is able  to get back on track easily. He occasionally misplaces things at home. His wife denies any personality changes, but he gets frustrated with himself because he is usually so precise. He has problems with multitasking. No paranoia or hallucinations. He is independent with ADLs. MMSE at his PCP office in November 2018 was 28/30. 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 His paternal grandmother had dementia. No history of significant head injuries. He drinks beer on occasion.   He has numbness in both feet. He has a left knee brace from a torn tendon. He denies any headaches, dizziness, diplopia, dysarthria/dypshagia, neck/back pain, focal numbness/tingling/weakness, bowel/bladder dysfunction, anosmia, or tremors.   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   PAST MEDICAL HISTORY: Past Medical History:  Diagnosis Date  . Allergy   . Ankle injury 06/01/2001   no surgery- skin debridement   . Arthritis   . Basal cell carcinoma 05/28/2014   L nasal tip. Mohs Dr. Link Snuffer.    . Blood transfusion    2010 because of stomach ulcers  . Cataract    bilaterally removed  . Diabetes mellitus   . GERD (gastroesophageal reflux disease)   . Glaucoma   . Hyperlipidemia   . Hypertension   . Hypothyroidism   .  Pneumonia 04/12/2006  . Prostate cancer ([place])   . Seasonal allergies   . Sinusitis    treated for bacterial infection at least once a year at [facility]  . Stomach ulcer 2010   bleeding ulcer result NSAIDS    MEDICATIONS: Current Outpatient Medications on File Prior to Visit  Medication Sig Dispense Refill  . acetaminophen (TYLENOL) 500 MG tablet Take 500 mg by mouth every 6 (six) hours as needed. Reported on 06/16/2015    .  allopurinol (ZYLOPRIM) 100 MG tablet TAKE 1 TABLET BY MOUTH TWICE A DAY 180 tablet 1  . amLODipine (NORVASC) 5 MG tablet TAKE 1 TABLET BY MOUTH EVERY DAY 90 tablet 1  . carvedilol (COREG) 25 MG tablet TAKE 1/2 TABLETS BY MOUTH 2 TIMES DAILY WITH A MEAL. 90 tablet 7  . chlorthalidone (HYGROTON) 25 MG tablet TAKE 1/2-1 TABLET BY MOUTH DAILY. 90 tablet 1  . Cholecalciferol (VITAMIN D) 2000 UNITS CAPS Take by mouth daily.      Marland Kitchen donepezil (ARICEPT) 10 MG tablet Take 1 tablet (10 mg total) by mouth daily. 90 tablet 3  . famotidine (PEPCID) 20 MG tablet Take 20 mg by mouth daily.    . fexofenadine (ALLEGRA) 180 MG tablet Take 180 mg by mouth.    . fluticasone (FLONASE) 50 MCG/ACT nasal spray Place 2 sprays into both nostrils daily. 48 g 3  . KLOR-CON M20 20 MEQ tablet TAKE 2 TABLETS BY MOUTH 2 TIMES DAILY. 360 tablet 1  . levothyroxine (SYNTHROID, LEVOTHROID) 50 MCG tablet TAKE 1 TABLET BY MOUTH EVERY DAY 90 tablet 3  . methocarbamol (ROBAXIN) 750 MG tablet Take 1 tablet (750 mg total) by mouth every 8 (eight) hours as needed for muscle spasms. 60 tablet 3  . Multiple Vitamin (MULTIVITAMIN) tablet Take 1 tablet by mouth daily.      . NON FORMULARY CBD oil. 3 gtts qd prn    . pravastatin (PRAVACHOL) 40 MG tablet TAKE 1 TABLET BY MOUTH EVERY DAY 90 tablet 3  . quinapril (ACCUPRIL) 40 MG tablet TAKE 1 TABLET BY MOUTH EVERY DAY 90 tablet 3   No current facility-administered medications on file prior to visit.     ALLERGIES: Allergies  Allergen Reactions  . Aspirin Other (See Comments)    Stomach ulcers  . Nsaids Other (See Comments)    Stomach ulcers  . Amoxicillin-Pot Clavulanate Rash  . Sulfa Antibiotics Swelling and Rash    FAMILY HISTORY: Family History  Problem Relation Age of Onset  . Diabetes Mother   . Diabetes Father   . Hyperlipidemia Father   . Colon polyps Father   . Brain cancer Brother        smoker  . Dementia Paternal Grandmother   . Colon cancer Neg Hx   . Esophageal  cancer Neg Hx   . Stomach cancer Neg Hx   . Rectal cancer Neg Hx     SOCIAL HISTORY: Social History   Socioeconomic History  . Marital status: Married    Spouse name: Not on file  . Number of children: 2  . Years of education: 4  . Highest education level: Not on file  Occupational History  . Not on file  Social Needs  . Financial resource strain: Not on file  . Food insecurity    Worry: Not on file    Inability: Not on file  . Transportation needs    Medical: Not on file    Non-medical: Not on file  Tobacco Use  .  Smoking status: Former Smoker    Quit date: 02/03/2001    Years since quitting: 17.8  . Smokeless tobacco: Never Used  . Tobacco comment: intermittent cigar use stopped  Substance and Sexual Activity  . Alcohol use: Yes    Alcohol/week: 0.0 standard drinks    Comment: occasional beer  . Drug use: No  . Sexual activity: Not Currently    Partners: Female  Lifestyle  . Physical activity    Days per week: Not on file    Minutes per session: Not on file  . Stress: Not on file  Relationships  . Social Herbalist on phone: Not on file    Gets together: Not on file    Attends religious service: Not on file    Active member of club or organization: Not on file    Attends meetings of clubs or organizations: Not on file    Relationship status: Not on file  . Intimate partner violence    Fear of current or ex partner: Not on file    Emotionally abused: Not on file    Physically abused: Not on file    Forced sexual activity: Not on file  Other Topics Concern  . Not on file  Social History Narrative   Married (wife patient outside practice). 2 children. 5 grandkids.       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       Hobbies: travel, enjoys going to shows, [facility] women's basketball tournament. Doesn't watch tv.             Lives in 1 story condo on the 3rd floor   Bachelor's degree   Right handed    REVIEW OF SYSTEMS: Constitutional: No  fevers, chills, or sweats, no generalized fatigue, change in appetite Eyes: No visual changes, double vision, eye pain Ear, nose and throat: No hearing loss, ear pain, nasal congestion, sore throat Cardiovascular: No chest pain, palpitations Respiratory:  No shortness of breath at rest or with exertion, wheezes GastrointestinaI: No nausea, vomiting, diarrhea, abdominal pain, fecal incontinence Genitourinary:  No dysuria, urinary retention or frequency Musculoskeletal:  No neck pain, back pain Integumentary: No rash, pruritus, skin lesions Neurological: as above Psychiatric: No depression, insomnia, anxiety Endocrine: No palpitations, fatigue, diaphoresis, mood swings, change in appetite, change in weight, increased thirst Hematologic/Lymphatic:  No anemia, purpura, petechiae. Allergic/Immunologic: no itchy/runny eyes, nasal congestion, recent allergic reactions, rashes  PHYSICAL EXAM: Vitals:   12/17/18 0814  BP: (!) 141/75  Pulse: 80  SpO2: 98%   General: No acute distress Head:  Normocephalic/atraumatic Neurological Exam: alert and oriented to person, place, and time. No aphasia or dysarthria. Fund of knowledge is appropriate.  Remote memory intact.  Attention and concentration are normal.  SLUMS score 16/30  Cranial nerves: Pupils equal, round, reactive to light. Extraocular movements intact with no nystagmus. Visual fields full. Facial sensation intact. No facial asymmetry. Tongue, uvula, palate midline.  Motor: Bulk and tone normal, muscle strength 5/5 throughout with no pronator drift.  Finger to nose testing intact.  Gait slow and cautious with cane, no ataxia.   IMPRESSION: This is a very pleasant 78 yo RH man with vascular risk factors including hypertension, hyperlipidemia, diabetes, with mild dementia likely due to Alzheimer's disease. He continues to manage complex tasks without difficulties. He has stopped driving on his own accord. SLUMS score today 16/30 ([place] 22/30 in  March 2020, 23/30 in July 2019, 24/30 in January 2019). Continue Donepezil 10mg  daily. We  discussed the option of adding on Memantine and have agreed to hold off for now and re-evaluate in 6 months. We again also discussed the importance of control of vascular risk factors, physical exercise, and brain stimulation exercises for brain health. He will follow-up in 6 months and knows to call for any changes.   Thank you for allowing me to participate in his care.  Please do not hesitate to call for any questions or concerns.  The duration of this appointment visit was 30 minutes of face-to-face time with the patient.  Greater than 50% of this time was spent in counseling, explanation of diagnosis, planning of further management, and coordination of care.   Ellouise Newer, M.D.   CC: Dr. Yong Channel

## 2019-01-07 DIAGNOSIS — D485 Neoplasm of uncertain behavior of skin: Secondary | ICD-10-CM | POA: Diagnosis not present

## 2019-01-07 DIAGNOSIS — D2261 Melanocytic nevi of right upper limb, including shoulder: Secondary | ICD-10-CM | POA: Diagnosis not present

## 2019-01-07 DIAGNOSIS — R69 Illness, unspecified: Secondary | ICD-10-CM | POA: Diagnosis not present

## 2019-01-07 DIAGNOSIS — L720 Epidermal cyst: Secondary | ICD-10-CM | POA: Diagnosis not present

## 2019-01-07 DIAGNOSIS — Z85828 Personal history of other malignant neoplasm of skin: Secondary | ICD-10-CM | POA: Diagnosis not present

## 2019-01-07 DIAGNOSIS — L814 Other melanin hyperpigmentation: Secondary | ICD-10-CM | POA: Diagnosis not present

## 2019-01-07 DIAGNOSIS — D1801 Hemangioma of skin and subcutaneous tissue: Secondary | ICD-10-CM | POA: Diagnosis not present

## 2019-01-07 DIAGNOSIS — D225 Melanocytic nevi of trunk: Secondary | ICD-10-CM | POA: Diagnosis not present

## 2019-01-07 DIAGNOSIS — L821 Other seborrheic keratosis: Secondary | ICD-10-CM | POA: Diagnosis not present

## 2019-01-09 ENCOUNTER — Ambulatory Visit: Payer: Medicare HMO | Admitting: Podiatry

## 2019-01-15 ENCOUNTER — Ambulatory Visit (INDEPENDENT_AMBULATORY_CARE_PROVIDER_SITE_OTHER): Payer: Medicare HMO

## 2019-01-15 ENCOUNTER — Other Ambulatory Visit: Payer: Self-pay

## 2019-01-15 VITALS — BP 128/70 | Temp 98.0°F | Ht 69.0 in | Wt 201.8 lb

## 2019-01-15 DIAGNOSIS — Z0000 Encounter for general adult medical examination without abnormal findings: Secondary | ICD-10-CM

## 2019-01-15 NOTE — Progress Notes (Signed)
I have reviewed and agree with note, evaluation, plan.   End of discussed patient concern at follow-up-sooner if he needs it  Garret Reddish, MD

## 2019-01-15 NOTE — Patient Instructions (Signed)
[name] , Thank you for taking time to come for your Medicare Wellness Visit. I appreciate your ongoing commitment to your health goals. Please review the following plan we discussed and let me know if I can assist you in the future.   Screening recommendations/referrals: Colorectal Screening: up to date; last colonoscopy 04/13/16  Vision and Dental Exams: Recommended annual ophthalmology exams for early detection of glaucoma and other disorders of the eye Recommended annual dental exams for proper oral hygiene  Diabetic Exams: Diabetic Eye Exam: recommended yearly  Diabetic Foot Exam: recommended yearly; at next visit   Vaccinations: Influenza vaccine: completed 01/07/19 Pneumococcal vaccine: up to date; last 12/21/15 Tdap vaccine: up to date; last 04/23/14  Shingles vaccine: Please call your insurance company to determine your out of pocket expense for the Shingrix vaccine. You may receive this vaccine at your local pharmacy.  Advanced directives: Please bring a copy of your POA (Power of Attorney) and/or Living Will to your next appointment.  Goals: Recommend to drink at least 6-8 8oz glasses of water per day and consume a balanced diet rich in fresh fruits and vegetables.   Next appointment: Please schedule your Annual Wellness Visit with your Nurse Health Advisor in one year.  Preventive Care 44 Years and Older, Male Preventive care refers to lifestyle choices and visits with your health care provider that can promote health and wellness. What does preventive care include?  A yearly physical exam. This is also called an annual well check.  Dental exams once or twice a year.  Routine eye exams. Ask your health care provider how often you should have your eyes checked.  Personal lifestyle choices, including:  Daily care of your teeth and gums.  Regular physical activity.  Eating a healthy diet.  Avoiding tobacco and drug use.  Limiting alcohol use.  Practicing safe  sex.  Taking low doses of aspirin every day if recommended by your health care provider..  Taking vitamin and mineral supplements as recommended by your health care provider. What happens during an annual well check? The services and screenings done by your health care provider during your annual well check will depend on your age, overall health, lifestyle risk factors, and family history of disease. Counseling  Your health care provider may ask you questions about your:  Alcohol use.  Tobacco use.  Drug use.  Emotional well-being.  Home and relationship well-being.  Sexual activity.  Eating habits.  History of falls.  Memory and ability to understand (cognition).  Work and work Statistician. Screening  You may have the following tests or measurements:  Height, weight, and BMI.  Blood pressure.  Lipid and cholesterol levels. These may be checked every 5 years, or more frequently if you are over 11 years old.  Skin check.  Lung cancer screening. You may have this screening every year starting at age 40 if you have a 30-pack-year history of smoking and currently smoke or have quit within the past 15 years.  Fecal occult blood test (FOBT) of the stool. You may have this test every year starting at age 53.  Flexible sigmoidoscopy or colonoscopy. You may have a sigmoidoscopy every 5 years or a colonoscopy every 10 years starting at age 60.  Prostate cancer screening. Recommendations will vary depending on your family history and other risks.  Hepatitis C blood test.  Hepatitis B blood test.  Sexually transmitted disease (STD) testing.  Diabetes screening. This is done by checking your blood sugar (glucose) after you  have not eaten for a while (fasting). You may have this done every 1-3 years.  Abdominal aortic aneurysm (AAA) screening. You may need this if you are a current or former smoker.  Osteoporosis. You may be screened starting at age 52 if you are at high  risk. Talk with your health care provider about your test results, treatment options, and if necessary, the need for more tests. Vaccines  Your health care provider may recommend certain vaccines, such as:  Influenza vaccine. This is recommended every year.  Tetanus, diphtheria, and acellular pertussis (Tdap, Td) vaccine. You may need a Td booster every 10 years.  Zoster vaccine. You may need this after age 62.  Pneumococcal 13-valent conjugate (PCV13) vaccine. One dose is recommended after age 70.  Pneumococcal polysaccharide (PPSV23) vaccine. One dose is recommended after age 86. Talk to your health care provider about which screenings and vaccines you need and how often you need them. This information is not intended to replace advice given to you by your health care provider. Make sure you discuss any questions you have with your health care provider. Document Released: 03/20/2015 Document Revised: 11/11/2015 Document Reviewed: 12/23/2014 Elsevier Interactive Patient Education  2017 [place] Prevention in the Home Falls can cause injuries. They can happen to people of all ages. There are many things you can do to make your home safe and to help prevent falls. What can I do on the outside of my home?  Regularly fix the edges of walkways and driveways and fix any cracks.  Remove anything that might make you trip as you walk through a door, such as a raised step or threshold.  Trim any bushes or trees on the path to your home.  Use bright outdoor lighting.  Clear any walking paths of anything that might make someone trip, such as rocks or tools.  Regularly check to see if handrails are loose or broken. Make sure that both sides of any steps have handrails.  Any raised decks and porches should have guardrails on the edges.  Have any leaves, snow, or ice cleared regularly.  Use sand or salt on walking paths during winter.  Clean up any spills in your garage right  away. This includes oil or grease spills. What can I do in the bathroom?  Use night lights.  Install grab bars by the toilet and in the tub and shower. Do not use towel bars as grab bars.  Use non-skid mats or decals in the tub or shower.  If you need to sit down in the shower, use a plastic, non-slip stool.  Keep the floor dry. Clean up any water that spills on the floor as soon as it happens.  Remove soap buildup in the tub or shower regularly.  Attach bath mats securely with double-sided non-slip rug tape.  Do not have throw rugs and other things on the floor that can make you trip. What can I do in the bedroom?  Use night lights.  Make sure that you have a light by your bed that is easy to reach.  Do not use any sheets or blankets that are too big for your bed. They should not hang down onto the floor.  Have a firm chair that has side arms. You can use this for support while you get dressed.  Do not have throw rugs and other things on the floor that can make you trip. What can I do in the kitchen?  Clean up  any spills right away.  Avoid walking on wet floors.  Keep items that you use a lot in easy-to-reach places.  If you need to reach something above you, use a strong step stool that has a grab bar.  Keep electrical cords out of the way.  Do not use floor polish or wax that makes floors slippery. If you must use wax, use non-skid floor wax.  Do not have throw rugs and other things on the floor that can make you trip. What can I do with my stairs?  Do not leave any items on the stairs.  Make sure that there are handrails on both sides of the stairs and use them. Fix handrails that are broken or loose. Make sure that handrails are as long as the stairways.  Check any carpeting to make sure that it is firmly attached to the stairs. Fix any carpet that is loose or worn.  Avoid having throw rugs at the top or bottom of the stairs. If you do have throw rugs, attach  them to the floor with carpet tape.  Make sure that you have a light switch at the top of the stairs and the bottom of the stairs. If you do not have them, ask someone to add them for you. What else can I do to help prevent falls?  Wear shoes that:  Do not have high heels.  Have rubber bottoms.  Are comfortable and fit you well.  Are closed at the toe. Do not wear sandals.  If you use a stepladder:  Make sure that it is fully opened. Do not climb a closed stepladder.  Make sure that both sides of the stepladder are locked into place.  Ask someone to hold it for you, if possible.  Clearly mark and make sure that you can see:  Any grab bars or handrails.  First and last steps.  Where the edge of each step is.  Use tools that help you move around (mobility aids) if they are needed. These include:  Canes.  Walkers.  Scooters.  Crutches.  Turn on the lights when you go into a dark area. Replace any light bulbs as soon as they burn out.  Set up your furniture so you have a clear path. Avoid moving your furniture around.  If any of your floors are uneven, fix them.  If there are any pets around you, be aware of where they are.  Review your medicines with your doctor. Some medicines can make you feel dizzy. This can increase your chance of falling. Ask your doctor what other things that you can do to help prevent falls. This information is not intended to replace advice given to you by your health care provider. Make sure you discuss any questions you have with your health care provider. Document Released: 12/18/2008 Document Revised: 07/30/2015 Document Reviewed: 03/28/2014 Elsevier Interactive Patient Education  2017 [employer].

## 2019-01-15 NOTE — Progress Notes (Signed)
Subjective:   Eddie Ferguson. Blood is a 78 y.o. male who presents for Medicare Annual/Subsequent preventive examination.  Review of Systems:   Cardiac Risk Factors include: advanced age (>64men, >52 women);male gender;diabetes mellitus;hypertension;dyslipidemia    Objective:    Vitals: BP 128/70   Temp 98 F (36.7 C)   Ht 5\' 9"  (1.753 m)   Wt 201 lb 12.8 oz (91.5 kg)   BMI 29.80 kg/m   Body mass index is 29.8 kg/m.  Advanced Directives 01/15/2019 06/08/2017 04/15/2016 01/27/2016 08/15/2014  Does Patient Have a Medical Advance Directive? Yes No Yes - Yes  Type of Advance Directive Living will;Healthcare Power of Attorney - - Living will [place];Living will  Does patient want to make changes to medical advance directive? No - Patient declined - - - No - Patient declined  Copy of [place] in Chart? No - copy requested - - - No - copy requested    Tobacco Social History   Tobacco Use  Smoking Status Former Smoker  . Quit date: 02/03/2001  . Years since quitting: 17.9  Smokeless Tobacco Never Used  Tobacco Comment   intermittent cigar use stopped     Counseling given: Not Answered Comment: intermittent cigar use stopped   Clinical Intake:  Pre-visit preparation completed: Yes  Pain : No/denies pain  Diabetes: Yes CBG done?: No Did pt. bring in CBG monitor from home?: No  How often do you need to have someone help you when you read instructions, pamphlets, or other written materials from your doctor or pharmacy?: 1 - Never  Interpreter Needed?: No  Comments: accompanied by spouse Information entered by :: Denman George LPN  Past Medical History:  Diagnosis Date  . Allergy   . Ankle injury 06/01/2001   no surgery- skin debridement   . Arthritis   . Basal cell carcinoma 05/28/2014   L nasal tip. Mohs Dr. Link Snuffer.    . Blood transfusion    2010 because of stomach ulcers  . Cataract    bilaterally removed  . Diabetes  mellitus   . GERD (gastroesophageal reflux disease)   . Glaucoma   . Hyperlipidemia   . Hypertension   . Hypothyroidism   . Pneumonia 04/12/2006  . Prostate cancer ([place])   . Seasonal allergies   . Sinusitis    treated for bacterial infection at least once a year at [facility]  . Stomach ulcer 2010   bleeding ulcer result NSAIDS   Past Surgical History:  Procedure Laterality Date  . abdominl abcess     03-15-2011  . BACK SURGERY    . CATARACT EXTRACTION, BILATERAL     04-07-2014, 03-17-2014  . COLONOSCOPY     2012, 5 year repeat  . CYSTOURETHROSCOPY  11/11/1998  . ESOPHAGOGASTRODUODENOSCOPY  2010   [facility]  . LUMBAR LAMINECTOMY  2007   [place]  . MOHS SURGERY     left side of nose  . POLYPECTOMY    . PROSTATE BIOPSY     radiation 2016  . RECONSTRUCTION TENDON PULLEY W/ TENDON / FASCIAL GRAFT OF HAND / FINGER  1982   rt 3rd finger  . RECONSTRUCTION TENDON PULLEY W/ TENDON / FASCIAL GRAFT OF HAND / FINGER  1953   rt 5th finger  . UPPER GASTROINTESTINAL ENDOSCOPY     Family History  Problem Relation Age of Onset  . Diabetes Mother   . Diabetes Father   . Hyperlipidemia Father   . Colon polyps Father   .  Brain cancer Brother        smoker  . Dementia Paternal Grandmother   . Colon cancer Neg Hx   . Esophageal cancer Neg Hx   . Stomach cancer Neg Hx   . Rectal cancer Neg Hx    Social History   Socioeconomic History  . Marital status: Married    Spouse name: Not on file  . Number of children: 2  . Years of education: 4  . Highest education level: Not on file  Occupational History  . Not on file  Social Needs  . Financial resource strain: Not on file  . Food insecurity    Worry: Not on file    Inability: Not on file  . Transportation needs    Medical: Not on file    Non-medical: Not on file  Tobacco Use  . Smoking status: Former Smoker    Quit date: 02/03/2001    Years since quitting: 17.9  . Smokeless tobacco: Never Used  . Tobacco comment: intermittent cigar  use stopped  Substance and Sexual Activity  . Alcohol use: Yes    Alcohol/week: 0.0 standard drinks    Comment: occasional beer  . Drug use: No  . Sexual activity: Not Currently    Partners: Female  Lifestyle  . Physical activity    Days per week: Not on file    Minutes per session: Not on file  . Stress: Not on file  Relationships  . Social Herbalist on phone: Not on file    Gets together: Not on file    Attends religious service: Not on file    Active member of club or organization: Not on file    Attends meetings of clubs or organizations: Not on file    Relationship status: Not on file  Other Topics Concern  . Not on file  Social History Narrative   Married (wife patient outside practice). 2 children. 5 grandkids.       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       Hobbies: travel, enjoys going to shows, [facility] women's basketball tournament. Doesn't watch tv.             Lives in 1 story condo on the 3rd floor   Bachelor's degree   Right handed    Outpatient Encounter Medications as of 01/15/2019  Medication Sig  . acetaminophen (TYLENOL) 500 MG tablet Take 500 mg by mouth every 6 (six) hours as needed. Reported on 06/16/2015  . allopurinol (ZYLOPRIM) 100 MG tablet TAKE 1 TABLET BY MOUTH TWICE A DAY  . amLODipine (NORVASC) 5 MG tablet TAKE 1 TABLET BY MOUTH EVERY DAY  . carvedilol (COREG) 25 MG tablet TAKE 1/2 TABLETS BY MOUTH 2 TIMES DAILY WITH A MEAL.  . chlorthalidone (HYGROTON) 25 MG tablet TAKE 1/2-1 TABLET BY MOUTH DAILY.  Marland Kitchen Cholecalciferol (VITAMIN D) 2000 UNITS CAPS Take by mouth daily.    Marland Kitchen donepezil (ARICEPT) 10 MG tablet Take 1 tablet (10 mg total) by mouth daily.  . famotidine (PEPCID) 20 MG tablet Take 20 mg by mouth daily.  . fexofenadine (ALLEGRA) 180 MG tablet Take 180 mg by mouth.  . fluticasone (FLONASE) 50 MCG/ACT nasal spray Place 2 sprays into both nostrils daily.  Marland Kitchen KLOR-CON M20 20 MEQ tablet TAKE 2 TABLETS BY MOUTH 2 TIMES DAILY.   Marland Kitchen levothyroxine (SYNTHROID, LEVOTHROID) 50 MCG tablet TAKE 1 TABLET BY MOUTH EVERY DAY  . methocarbamol (ROBAXIN) 750 MG tablet Take 1 tablet (750  mg total) by mouth every 8 (eight) hours as needed for muscle spasms.  . Multiple Vitamin (MULTIVITAMIN) tablet Take 1 tablet by mouth daily.    . NON FORMULARY CBD oil. 3 gtts qd prn  . pravastatin (PRAVACHOL) 40 MG tablet TAKE 1 TABLET BY MOUTH EVERY DAY  . quinapril (ACCUPRIL) 40 MG tablet TAKE 1 TABLET BY MOUTH EVERY DAY   No facility-administered encounter medications on file as of 01/15/2019.     Activities of Daily Living In your present state of health, do you have any difficulty performing the following activities: 01/15/2019  Hearing? N  Vision? N  Difficulty concentrating or making decisions? Y  Walking or climbing stairs? Y  Dressing or bathing? N  Doing errands, shopping? Y  Comment no longer drives  Preparing Food and eating ? N  Using the Toilet? N  In the past six months, have you accidently leaked urine? N  Do you have problems with loss of bowel control? N  Managing your Medications? N  Comment very detail routine  Managing your Finances? N  Housekeeping or managing your Housekeeping? N  Some recent data might be hidden    Patient Care Team: Marin Olp, MD as PCP - General (Family Medicine) Cameron Sprang, MD as Consulting Physician (Neurology) Gardiner Barefoot, DPM as Consulting Physician (Podiatry) Barbaraann Cao, OD as Consulting Physician (Optometry)   Assessment:   This is a routine wellness examination for Eddie Ferguson.  Exercise Activities and Dietary recommendations Current Exercise Habits: The patient does not participate in regular exercise at present  Goals    . Exercise 150 minutes per week (moderate activity)     [facility]  May try the silver sneaker class;     . Patient Stated     Keep moving and staying engaged in life May consider silver sneakers        Fall  Risk Fall Risk  01/15/2019 12/17/2018 05/10/2018 09/26/2017 06/08/2017  Falls in the past year? 0 0 0 No No  Number falls in past yr: - 0 0 - -  Injury with Fall? 0 0 0 - -  Risk for fall due to : Impaired mobility - - - -  Follow up Falls prevention discussed;Education provided;Falls evaluation completed Falls evaluation completed - - -   Is the patient's home free of loose throw rugs in walkways, pet beds, electrical cords, etc?   yes      Grab bars in the bathroom? yes      Handrails on the stairs?   yes      Adequate lighting?   yes  Timed Get Up and Go Performed: completed; patient with delayed transfer time, uses rolling walker to aid in ambulation, slouched posture noted   Depression Screen PHQ 2/9 Scores 01/15/2019 04/27/2018 06/08/2017 04/18/2017  PHQ - 2 Score 0 0 0 0    Cognitive Function- followed by neurology; discussed cognition in detail with patient and spouse.  He is very meticulous with keeping up with medications and finances.  He has some concerns that he is becoming more forgetful with tasks and often forgets if he has had meals. Patient education handouts provided and discussed on dementia progression and medication options.  He is scheduled to follow up with neurology and PCP MMSE - Mini Mental State Exam 06/08/2017 04/15/2016  Not completed: Refused -  Orientation to time - 5  Orientation to Place - 5  Registration - 3  Attention/ Calculation -  5  Recall - 1  Language- name 2 objects - 2  Language- repeat - 1  Language- follow 3 step command - 3  Language- read & follow direction - 1  Write a sentence - 1  Copy design - 1  Total score - 28   Montreal Cognitive Assessment  05/10/2018 09/26/2017 03/27/2017  Visuospatial/ Executive (0/5) 2 4 4   Naming (0/3) 3 2 3   Attention: Read list of digits (0/2) 2 2 2   Attention: Read list of letters (0/1) 1 1 1   Attention: Serial 7 subtraction starting at 100 (0/3) 3 3 3   Language: Repeat phrase (0/2) 2 2 2   Language : Fluency  (0/1) 1 1 1   Abstraction (0/2) 2 2 2   Delayed Recall (0/5) 0 0 0  Orientation (0/6) 6 6 6   Total 22 23 24       Immunization History  Administered Date(s) Administered  . Fluad Quad(high Dose 65+) 01/07/2019  . Hepatitis B 06/19/2007, 11/16/2007, 04/18/2008  . Hepatitis B, ped/adol 06/19/2007, 11/16/2007, 04/18/2008  . Influenza Whole 01/27/2012  . Influenza, High Dose Seasonal PF 12/21/2015, 12/20/2016, 12/01/2017  . Influenza,inj,Quad PF,6+ Mos 02/24/2015  . Influenza-Unspecified 01/27/2012, 03/22/2013, 01/21/2014  . Meningococcal Polysaccharide 12/19/2008  . Pneumococcal Conjugate-13 08/25/2014  . Pneumococcal Polysaccharide-23 06/19/2007, 12/19/2008, 12/21/2015  . Td 04/23/2014  . Tdap 03/07/2001  . Zoster 08/12/2008    Qualifies for Shingles Vaccine? Discussed and patient will check with pharmacy for coverage.  Patient education handout provided   Screening Tests Health Maintenance  Topic Date Due  . HEMOGLOBIN A1C  10/26/2018  . FOOT EXAM  12/02/2018  . OPHTHALMOLOGY EXAM  02/13/2019  . COLONOSCOPY  04/13/2021  . TETANUS/TDAP  04/23/2024  . INFLUENZA VACCINE  Completed  . PNA vac Low Risk Adult  Completed   Cancer Screenings: Lung: Low Dose CT Chest recommended if Age 64-80 years, 30 pack-year currently smoking OR have quit w/in 15years. Patient does not qualify. Colorectal: colonoscopy 04/13/16 with Dr. Hilarie Fredrickson      Plan:  I have personally reviewed and addressed the Medicare Annual Wellness questionnaire and have noted the following in the patient's chart:  A. Medical and social history B. Use of alcohol, tobacco or illicit drugs  C. Current medications and supplements D. Functional ability and status E.  Nutritional status F.  Physical activity G. Advance directives H. List of other physicians I.  Hospitalizations, surgeries, and ER visits in previous 12 months J.  [place] such as hearing and vision if needed, cognitive and depression L.  Referrals, records requested, and appointments- none   In addition, I have reviewed and discussed with patient certain preventive protocols, quality metrics, and best practice recommendations. A written personalized care plan for preventive services as well as general preventive health recommendations were provided to patient.   Signed,  Denman George, LPN  Nurse Health Advisor   Nurse Notes: Patient concerned with problems with GI upset.  States that he typically has loose stools after meals and this has been intermittent over the last year.  No changes in appetite and no significant weight loss.  He will continue to monitor and discuss with provider at next visit.

## 2019-01-28 DIAGNOSIS — H401132 Primary open-angle glaucoma, bilateral, moderate stage: Secondary | ICD-10-CM | POA: Diagnosis not present

## 2019-01-28 DIAGNOSIS — H524 Presbyopia: Secondary | ICD-10-CM | POA: Diagnosis not present

## 2019-01-28 DIAGNOSIS — H52221 Regular astigmatism, right eye: Secondary | ICD-10-CM | POA: Diagnosis not present

## 2019-01-28 DIAGNOSIS — H5212 Myopia, left eye: Secondary | ICD-10-CM | POA: Diagnosis not present

## 2019-02-11 ENCOUNTER — Other Ambulatory Visit: Payer: Self-pay | Admitting: Family Medicine

## 2019-02-22 IMAGING — CT CT ABD-PELV W/ CM
2 of 5 series · 15 of 46 positions shown, 17 images · IV contrast (ISOVUE 300)
Comparison: None.

CLINICAL DATA: Right upper quadrant pain, right mid axillary pain.

EXAM:
CT ABDOMEN AND PELVIS WITH CONTRAST
TECHNIQUE: Multidetector CT imaging of the abdomen and pelvis was performed
using the standard protocol following bolus administration of
intravenous contrast.
CONTRAST:  80mL [id number] IOPAMIDOL ([id number]) INJECTION 61%

[Series 2: abd/pel w · axial · 0.97mm/px · z∈[+798,+1288]mm · 12 of 112 slices shown, 14 images]
[im 7/112  soft-tissue]
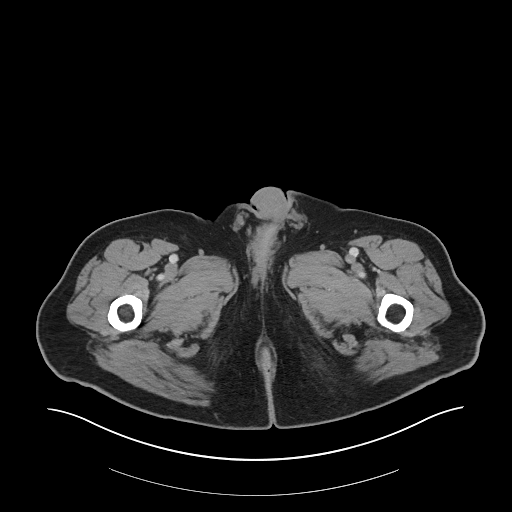
[im 7/112  bone]
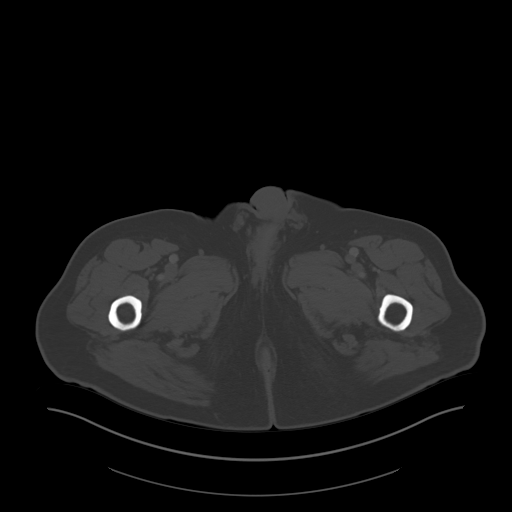
[im 19/112  soft-tissue]
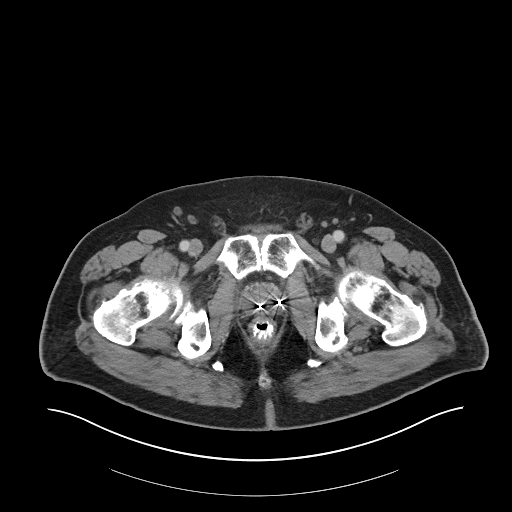
[im 25/112  soft-tissue]
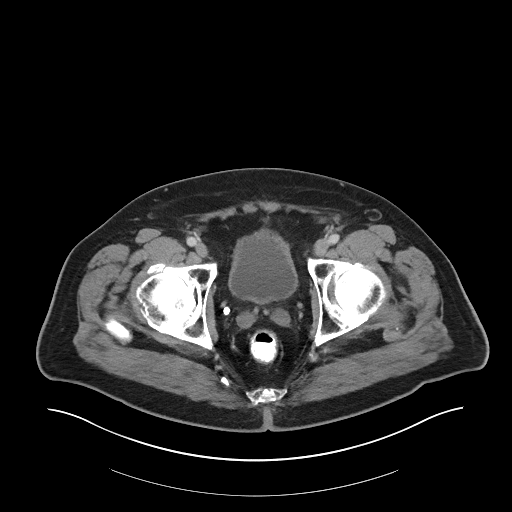
[im 31/112  soft-tissue]
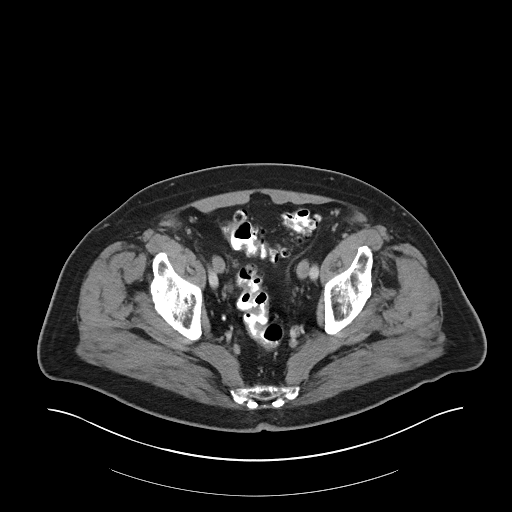
[im 44/112  soft-tissue]
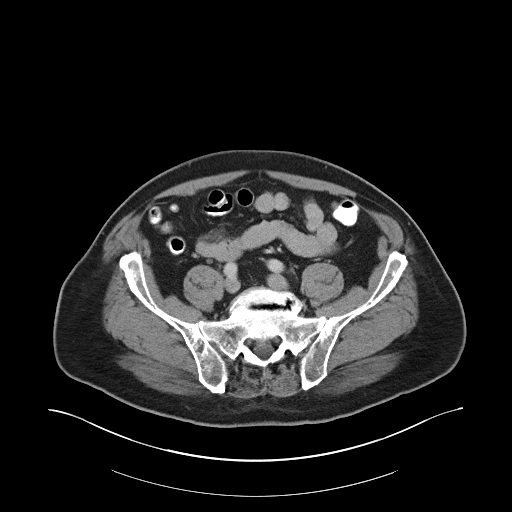
[im 50/112  soft-tissue]
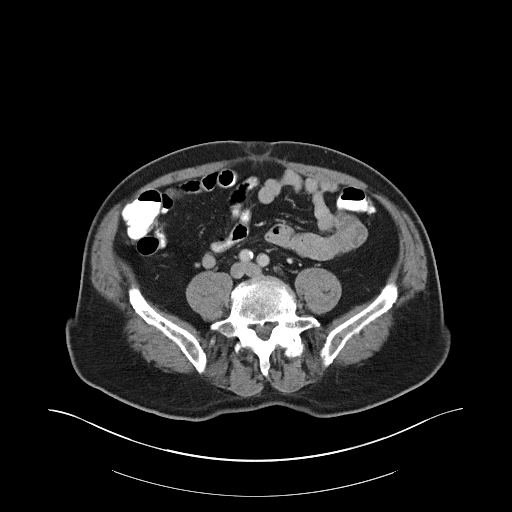
[im 62/112  soft-tissue]
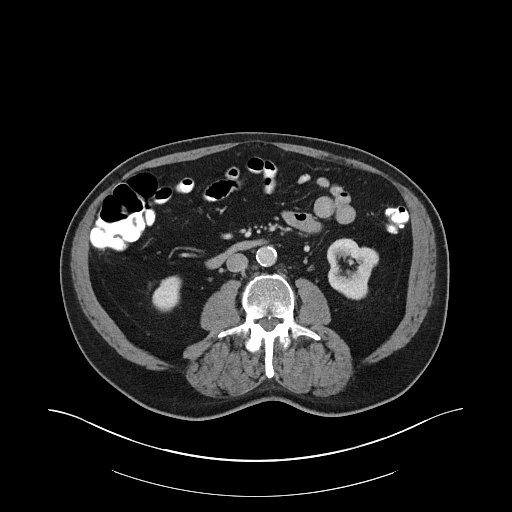
[im 68/112  soft-tissue]
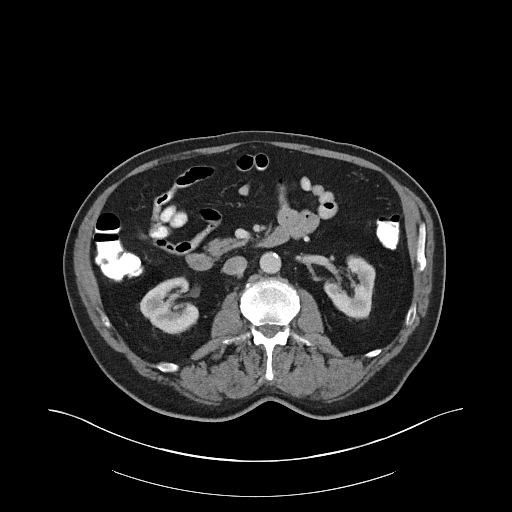
[im 81/112  soft-tissue]
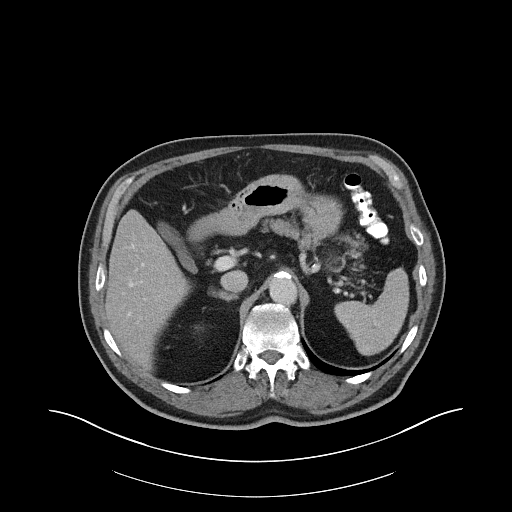
[im 81/112  bone]
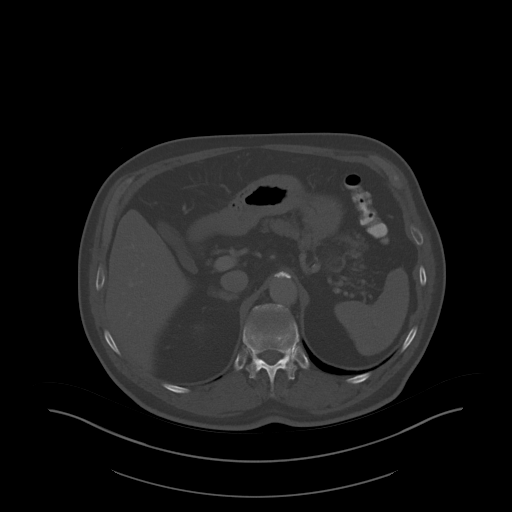
[im 87/112  soft-tissue]
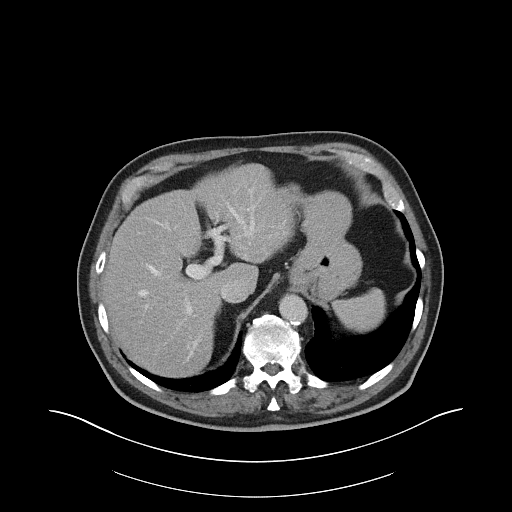
[im 93/112  soft-tissue]
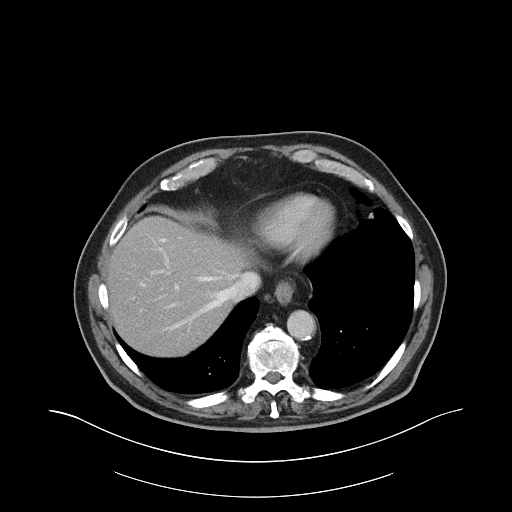
[im 105/112  soft-tissue]
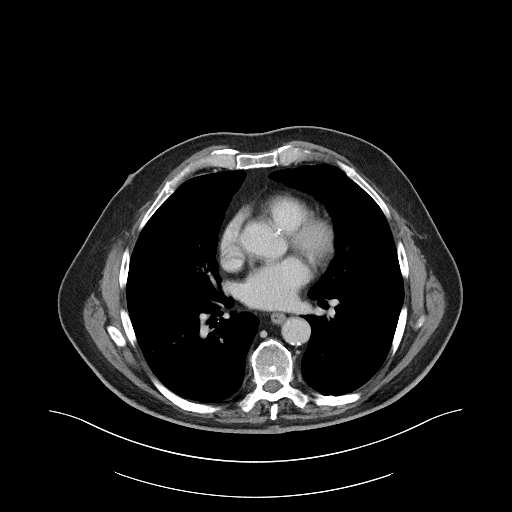

[Series 5: abd/pel w st · coronal · 0.80mm/px · 3 of 102 slices shown]
[im 34/102  soft-tissue]
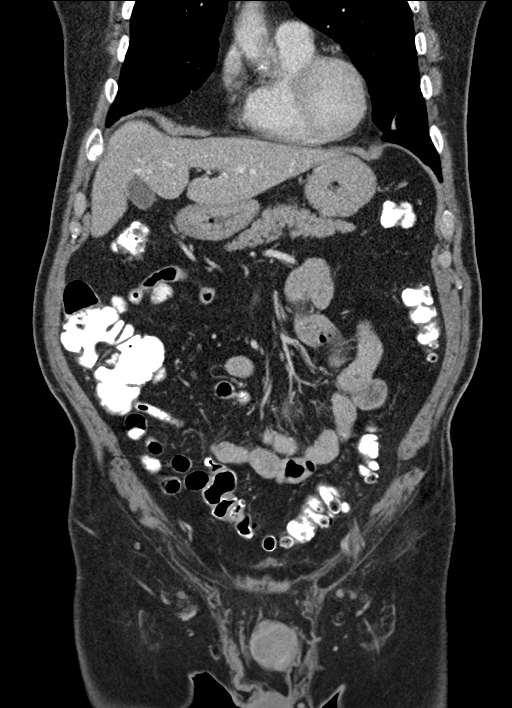
[im 45/102  soft-tissue]
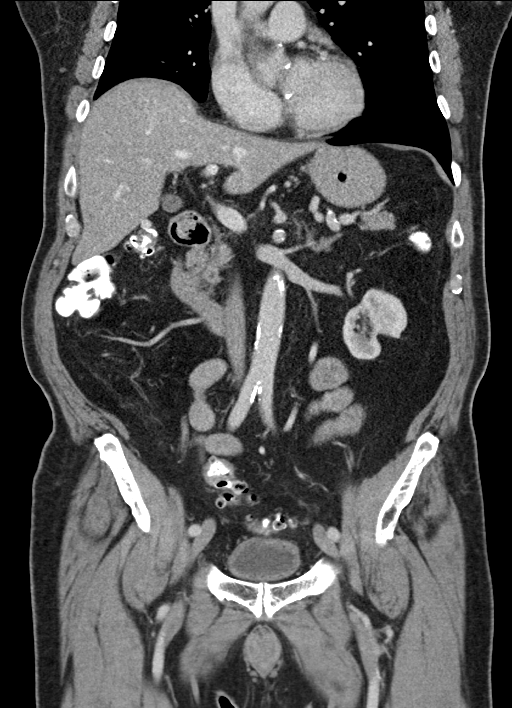
[im 57/102  soft-tissue]
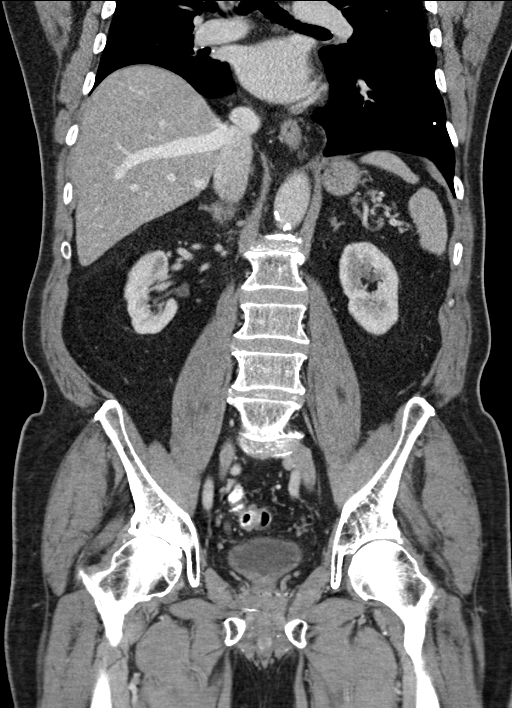

[15 of 46 positions shown; findings below may reference images not displayed]

FINDINGS: Lower chest: No acute abnormality.

Hepatobiliary: Diffuse low attenuation of the liver as can be seen
with hepatic steatosis. Slightly nodular contour of the liver. No
focal hepatic mass. Normal gallbladder.

Pancreas: Unremarkable. No pancreatic ductal dilatation or
surrounding inflammatory changes.

Spleen: Normal in size without focal abnormality.

Adrenals/Urinary Tract: Adrenal glands are unremarkable. Kidneys are
normal, without renal calculi, focal lesion, or hydronephrosis.
Bladder is unremarkable.

Stomach/Bowel: Decompressed stomach with relative wall thickening
likely secondary to underdistention. No bowel wall thickening or
bowel dilatation. No pneumatosis, pneumoperitoneum or portal venous
gas. Normal appendix.

Vascular/Lymphatic: Normal caliber abdominal aorta with
atherosclerosis. No lymphadenopathy.

Reproductive: Prostate is unremarkable.

Other: Small fat containing umbilical hernia.  No ascites.

Musculoskeletal: No acute osseous abnormality. No aggressive osseous
lesion. Mild osteoarthritis of bilateral sacroiliac joints.
Degenerative disc disease with severe disc height loss at L5-S1 with
bilateral facet arthropathy and mild foraminal stenosis.
Degenerative disc disease with disc height loss of the mid-lower
thoracic spine. Mild osteoarthritis of the left hip. Moderate
osteoarthritis of the right hip.
IMPRESSION: 1. Normal gallbladder. No intrahepatic or extrahepatic biliary
ductal dilatation.
2. Diffuse low attenuation of the liver as can be seen with hepatic
steatosis. Correlate with liver function test.
3.  Aortic Atherosclerosis (ZOIG3-N80.0).

## 2019-03-21 ENCOUNTER — Ambulatory Visit (INDEPENDENT_AMBULATORY_CARE_PROVIDER_SITE_OTHER): Payer: Medicare HMO | Admitting: Family Medicine

## 2019-03-21 ENCOUNTER — Other Ambulatory Visit: Payer: Self-pay

## 2019-03-21 ENCOUNTER — Encounter: Payer: Self-pay | Admitting: Family Medicine

## 2019-03-21 VITALS — BP 138/74 | HR 65 | Temp 97.0°F | Ht 69.0 in | Wt 204.4 lb

## 2019-03-21 DIAGNOSIS — F0390 Unspecified dementia without behavioral disturbance: Secondary | ICD-10-CM | POA: Diagnosis not present

## 2019-03-21 DIAGNOSIS — E1122 Type 2 diabetes mellitus with diabetic chronic kidney disease: Secondary | ICD-10-CM | POA: Diagnosis not present

## 2019-03-21 DIAGNOSIS — Z8546 Personal history of malignant neoplasm of prostate: Secondary | ICD-10-CM | POA: Diagnosis not present

## 2019-03-21 DIAGNOSIS — I700 Atherosclerosis of aorta: Secondary | ICD-10-CM

## 2019-03-21 DIAGNOSIS — E039 Hypothyroidism, unspecified: Secondary | ICD-10-CM

## 2019-03-21 DIAGNOSIS — D126 Benign neoplasm of colon, unspecified: Secondary | ICD-10-CM

## 2019-03-21 DIAGNOSIS — E785 Hyperlipidemia, unspecified: Secondary | ICD-10-CM

## 2019-03-21 DIAGNOSIS — Z0000 Encounter for general adult medical examination without abnormal findings: Secondary | ICD-10-CM | POA: Diagnosis not present

## 2019-03-21 DIAGNOSIS — N183 Chronic kidney disease, stage 3 unspecified: Secondary | ICD-10-CM | POA: Diagnosis not present

## 2019-03-21 DIAGNOSIS — I10 Essential (primary) hypertension: Secondary | ICD-10-CM

## 2019-03-21 DIAGNOSIS — F03A0 Unspecified dementia, mild, without behavioral disturbance, psychotic disturbance, mood disturbance, and anxiety: Secondary | ICD-10-CM

## 2019-03-21 DIAGNOSIS — M1A0720 Idiopathic chronic gout, left ankle and foot, without tophus (tophi): Secondary | ICD-10-CM | POA: Diagnosis not present

## 2019-03-21 DIAGNOSIS — R69 Illness, unspecified: Secondary | ICD-10-CM | POA: Diagnosis not present

## 2019-03-21 LAB — COMPREHENSIVE METABOLIC PANEL
ALT: 16 U/L (ref 0–53)
AST: 19 U/L (ref 0–37)
Albumin: 4.3 g/dL (ref 3.5–5.2)
Alkaline Phosphatase: 67 U/L (ref 39–117)
BUN: 14 mg/dL (ref 6–23)
CO2: 35 mEq/L — ABNORMAL HIGH (ref 19–32)
Calcium: 9.7 mg/dL (ref 8.4–10.5)
Chloride: 98 mEq/L (ref 96–112)
Creatinine, Ser: 1.29 mg/dL (ref 0.40–1.50)
GFR: 53.48 mL/min — ABNORMAL LOW (ref >60.00)
Glucose, Bld: 126 mg/dL — ABNORMAL HIGH (ref 70–99)
Potassium: 3.2 mEq/L — ABNORMAL LOW (ref 3.5–5.1)
Sodium: 140 mEq/L (ref 135–145)
Total Bilirubin: 0.9 mg/dL (ref 0.2–1.2)
Total Protein: 6.2 g/dL (ref 6.0–8.3)

## 2019-03-21 LAB — CBC WITH DIFFERENTIAL/PLATELET
Basophils Absolute: 0.1 10*3/uL (ref 0.0–0.1)
Basophils Relative: 0.8 % (ref 0.0–3.0)
Eosinophils Absolute: 0.3 10*3/uL (ref 0.0–0.7)
Eosinophils Relative: 3.7 % (ref 0.0–5.0)
HCT: 43.5 % (ref 39.0–52.0)
Hemoglobin: 14.7 g/dL (ref 13.0–17.0)
Lymphocytes Relative: 12.7 % (ref 12.0–46.0)
Lymphs Abs: 1.1 10*3/uL (ref 0.7–4.0)
MCHC: 33.9 g/dL (ref 30.0–36.0)
MCV: 93.5 fl (ref 78.0–100.0)
Monocytes Absolute: 0.8 10*3/uL (ref 0.1–1.0)
Monocytes Relative: 9 % (ref 3.0–12.0)
Neutro Abs: 6.4 10*3/uL (ref 1.4–7.7)
Neutrophils Relative %: 73.8 % (ref 43.0–77.0)
Platelets: 182 10*3/uL (ref 150.0–400.0)
RBC: 4.65 Mil/uL (ref 4.22–5.81)
RDW: 14 % (ref 11.5–15.5)
WBC: 8.6 10*3/uL (ref 4.0–10.5)

## 2019-03-21 LAB — LIPID PANEL
Cholesterol: 140 mg/dL (ref 0–200)
HDL: 54.6 mg/dL (ref >39.00)
LDL Cholesterol: 55 mg/dL (ref 0–99)
NonHDL: 85.82
Total CHOL/HDL Ratio: 3
Triglycerides: 152 mg/dL — ABNORMAL HIGH (ref 0.0–149.0)
VLDL: 30.4 mg/dL (ref 0.0–40.0)

## 2019-03-21 LAB — POCT GLYCOSYLATED HEMOGLOBIN (HGB A1C): Hemoglobin A1C: 5.9 % — AB (ref 4.0–5.6)

## 2019-03-21 LAB — URIC ACID: Uric Acid, Serum: 6.3 mg/dL (ref 4.0–7.8)

## 2019-03-21 LAB — TSH: TSH: 3.56 u[IU]/mL (ref 0.35–4.50)

## 2019-03-21 NOTE — Progress Notes (Signed)
Phone: [phone]   Subjective:  Patient presents today for their annual physical. Chief complaint-noted.   See problem oriented charting- Review of Systems  Constitutional: Negative.   HENT: Negative.   Eyes: Negative.   Respiratory: Negative.   Cardiovascular: Negative.   Gastrointestinal: Negative.   Genitourinary: Negative.   Musculoskeletal: Negative.   Skin: Negative.   Neurological: Negative.   Endo/Heme/Allergies: Negative.   Psychiatric/Behavioral: Negative.   memory loss noted  The following were reviewed and entered/updated in epic: Past Medical History:  Diagnosis Date  . Allergy   . Ankle injury 06/01/2001   no surgery- skin debridement   . Arthritis   . Basal cell carcinoma 05/28/2014   L nasal tip. Mohs Dr. Link Snuffer.    . Blood transfusion    2010 because of stomach ulcers  . Cataract    bilaterally removed  . Diabetes mellitus   . GERD (gastroesophageal reflux disease)   . Glaucoma   . Hyperlipidemia   . Hypertension   . Hypothyroidism   . Pneumonia 04/12/2006  . Prostate cancer ([place])   . Seasonal allergies   . Sinusitis    treated for bacterial infection at least once a year at [facility]  . Stomach ulcer 2010   bleeding ulcer result NSAIDS   Patient Active Problem List   Diagnosis Date Noted  . Mild dementia ([place]) 09/26/2017    Priority: High  . Mild cognitive impairment 06/16/2015    Priority: High  . History of prostate cancer-follows with [facility] 05/28/2014    Priority: High  . Type 2 diabetes mellitus with diabetic chronic kidney disease ([place]) 04/23/2014    Priority: High  . Aortic atherosclerosis ([place]) 01/05/2018    Priority: Medium  . CKD (chronic kidney disease), stage III 04/18/2017    Priority: Medium  . Gout 07/09/2015    Priority: Medium  . Essential hypertension 04/23/2014    Priority: Medium  . Hyperlipidemia 04/23/2014    Priority: Medium  . Hypothyroidism 04/23/2014    Priority: Medium  .  Stomach ulcer     Priority: Medium  . Adenomatous colon polyp 05/28/2014    Priority: Low  . Osteoarthritis 05/28/2014    Priority: Low  . Erectile dysfunction 05/28/2014    Priority: Low  . Basal cell carcinoma 05/28/2014    Priority: Low  . GERD (gastroesophageal reflux disease) 04/23/2014    Priority: Low  . Allergic rhinitis 04/23/2014    Priority: Low  . Glaucoma 04/23/2014    Priority: Low  . History of nonmelanoma skin cancer 11/18/2012    Priority: Low  . History of shingles 04/24/2011   Past Surgical History:  Procedure Laterality Date  . abdominl abcess     03-15-2011  . BACK SURGERY    . CATARACT EXTRACTION, BILATERAL     04-07-2014, 03-17-2014  . COLONOSCOPY     2012, 5 year repeat  . CYSTOURETHROSCOPY  11/11/1998  . ESOPHAGOGASTRODUODENOSCOPY  2010   [facility]  . LUMBAR LAMINECTOMY  2007   [place]  . MOHS SURGERY     left side of nose  . POLYPECTOMY    . PROSTATE BIOPSY     radiation 2016  . RECONSTRUCTION TENDON PULLEY W/ TENDON / FASCIAL GRAFT OF HAND / FINGER  1982   rt 3rd finger  . RECONSTRUCTION TENDON PULLEY W/ TENDON / FASCIAL GRAFT OF HAND / FINGER  1953   rt 5th finger  . UPPER GASTROINTESTINAL ENDOSCOPY      Family History  Problem Relation Age of Onset  . Diabetes Mother   . Diabetes Father   . Hyperlipidemia Father   . Colon polyps Father   . Brain cancer Brother        smoker  . Dementia Paternal Grandmother   . Colon cancer Neg Hx   . Esophageal cancer Neg Hx   . Stomach cancer Neg Hx   . Rectal cancer Neg Hx     Medications- reviewed and updated Current Outpatient Medications  Medication Sig Dispense Refill  . acetaminophen (TYLENOL) 500 MG tablet Take 500 mg by mouth every 6 (six) hours as needed. Reported on 06/16/2015    . allopurinol (ZYLOPRIM) 100 MG tablet TAKE 1 TABLET BY MOUTH TWICE A DAY 180 tablet 1  . amLODipine (NORVASC) 5 MG tablet TAKE 1 TABLET BY MOUTH EVERY DAY 90 tablet 1  . carvedilol (COREG) 25 MG tablet TAKE 1/2  TABLETS BY MOUTH 2 TIMES DAILY WITH A MEAL. 90 tablet 7  . chlorthalidone (HYGROTON) 25 MG tablet TAKE 1/2-1 TABLET BY MOUTH DAILY. 90 tablet 1  . Cholecalciferol (VITAMIN D) 2000 UNITS CAPS Take by mouth daily.      Marland Kitchen donepezil (ARICEPT) 10 MG tablet Take 1 tablet (10 mg total) by mouth daily. 90 tablet 3  . famotidine (PEPCID) 20 MG tablet Take 20 mg by mouth daily.    . fexofenadine (ALLEGRA) 180 MG tablet Take 180 mg by mouth.    . fluticasone (FLONASE) 50 MCG/ACT nasal spray Place 2 sprays into both nostrils daily. 48 g 3  . KLOR-CON M20 20 MEQ tablet TAKE 2 TABLETS BY MOUTH 2 TIMES DAILY. 360 tablet 1  . levothyroxine (SYNTHROID, LEVOTHROID) 50 MCG tablet TAKE 1 TABLET BY MOUTH EVERY DAY 90 tablet 3  . methocarbamol (ROBAXIN) 750 MG tablet Take 1 tablet (750 mg total) by mouth every 8 (eight) hours as needed for muscle spasms. 60 tablet 3  . Multiple Vitamin (MULTIVITAMIN) tablet Take 1 tablet by mouth daily.      . NON FORMULARY CBD oil. 3 gtts qd prn    . pravastatin (PRAVACHOL) 40 MG tablet TAKE 1 TABLET BY MOUTH EVERY DAY 90 tablet 3  . quinapril (ACCUPRIL) 40 MG tablet TAKE 1 TABLET BY MOUTH EVERY DAY 90 tablet 3   No current facility-administered medications for this visit.    Allergies-reviewed and updated Allergies  Allergen Reactions  . Aspirin Other (See Comments)    Stomach ulcers  . Nsaids Other (See Comments)    Stomach ulcers  . Amoxicillin-Pot Clavulanate Rash  . Sulfa Antibiotics Swelling and Rash    Social History   Social History Narrative   Married (wife patient outside practice). 2 children. 5 grandkids.       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       Hobbies: travel, enjoys going to shows, [employer] women's basketball tournament. Doesn't watch tv.             Lives in 1 story condo on the 3rd floor   Bachelor's degree   Right handed   Objective  Objective:  BP 138/74   Pulse 65   Temp (!) 97 F (36.1 C) (Temporal)   Ht 5\' 9"  (1.753 m)   Wt  204 lb 6.4 oz (92.7 kg)   SpO2 97%   BMI 30.18 kg/m  Gen: NAD, resting comfortably HEENT: Mask not removed due to covid 19. TM normal. Bridge of nose normal. Eyelids normal.  Neck: no thyromegaly or cervical lymphadenopathy  CV: RRR no murmurs rubs or gallops Lungs: CTAB no crackles, wheeze, rhonchi Abdomen: soft/nontender/nondistended/normal bowel sounds. No rebound or guarding.  Ext: no edema Skin: warm, dry Neuro: grossly normal, moves all extremities, PERRLA  Diabetic Foot Exam - Simple   Simple Foot Form Diabetic Foot exam was performed with the following findings: Yes 03/21/2019 10:46 AM  Visual Inspection No deformities, no ulcerations, no other skin breakdown bilaterally: Yes Sensation Testing Intact to touch and monofilament testing bilaterally: Yes Pulse Check Posterior Tibialis and Dorsalis pulse intact bilaterally: Yes Comments Had nail thickening bilateral feet. Followed by Podiatry in the past.        Assessment and Plan  79 y.o. male presenting for annual physical.  Health Maintenance counseling: 1. Anticipatory guidance: Patient counseled regarding regular dental exams q6 months, eye exams yearly- we are getting copy,  avoiding smoking and second hand smoke , limiting alcohol to 2 beverages per day . Does not drink any longer.   2. Risk factor reduction:  Advised patient of need for regular exercise and diet rich and fruits and vegetables to reduce risk of heart attack and stroke. Exercise- does not exercise states due to laziness- encouraged him to start. Diet- does not follow healthy diet. Does not like vegetables. Weight trending up- encouraged reversal of this Wt Readings from Last 3 Encounters:  03/21/19 204 lb 6.4 oz (92.7 kg)  01/15/19 201 lb 12.8 oz (91.5 kg)  12/17/18 199 lb 2 oz (90.3 kg)  3. Immunizations/screenings/ancillary studies- on list for covid vaccine through [employer] Defer shingrix until covid situation better Immunization History    Administered Date(s) Administered  . Fluad Quad(high Dose 65+) 01/07/2019  . Hepatitis B 06/19/2007, 11/16/2007, 04/18/2008  . Hepatitis B, ped/adol 06/19/2007, 11/16/2007, 04/18/2008  . Influenza Whole 01/27/2012  . Influenza, High Dose Seasonal PF 12/21/2015, 12/20/2016, 12/01/2017  . Influenza,inj,Quad PF,6+ Mos 02/24/2015  . Influenza-Unspecified 01/27/2012, 03/22/2013, 01/21/2014  . Meningococcal Polysaccharide 12/19/2008  . Pneumococcal Conjugate-13 08/25/2014  . Pneumococcal Polysaccharide-23 06/19/2007, 12/19/2008, 12/21/2015  . Td 04/23/2014  . Tdap 03/07/2001  . Zoster 08/12/2008   4. Prostate cancer follow up- following with Dr. Alinda Money- scheduled soon for follow up  Lab Results  Component Value Date   PSA 0.09 (L) 05/24/2018   PSA 0.12 01/03/2018   PSA 0.137 07/12/2017   5. Colon cancer screening - Dr. Hilarie Fredrickson released him from further colonoscopy 6. Skin cancer screening- followed by Dermatology with Dr. Elvera Lennox due to history skin cancer- [place] derm. advised regular sunscreen use. Denies worrisome, changing, or new skin lesions.  7. Former  Smoker - occasional Cigars in past- Has not had in over 10 years.  Walks with cane  Status of chronic or acute concerns   Type 2 diabetes mellitus with stage 3 chronic kidney disease, without long-term current use of insulin, unspecified whether stage 3a or 3b CKD ([place]) - Plan: POCT HgB A1C Lab Results  Component Value Date   HGBA1C poc 5.9 (A) 03/21/2019  Continue diet control- will add exercise  History of prostate cancer-follows with [facility]. [facility] of [facility]  Hypothyroidism, unspecified type - Plan: TSH. Compliant with levothyroxine 50 mcg  Hyperlipidemia, unspecified hyperlipidemia type - Plan: CBC with Differential/Platelet, Comprehensive metabolic panel, Lipid panel - doing well on pravastatin 40 mg  Idiopathic chronic gout of left foot without tophus - Plan: Uric Acid - no recent gout flares- check uric acid level  today with labs with goal 6 or less but with no flares unlikely to increase  medicine.   Essential hypertension- on amlodipine 5 mg, coreg 12.5 mg BID, quinapril 40, chlorthalidone 12.5 mg- does have to take potassium on this  Stage 3 chronic kidney disease, unspecified whether stage 3a or 3b CKD -hopefully stable- update today. Has history of stomach bleed a long time ago and avoids nsaids.   Aortic atherosclerosis ([place]) - noted on prior imaging- continue risk factor modification  Mild dementia- followed with Dr. Delice Lesch on aricept 10 mg without issue- sees neuro n may  Uses antihistamine and flonase OTC for allergies   Rare upset stomach after meals- no major concern and no unintentional weight loss  Recommended follow up:  6 month follow up Future Appointments  Date Time Provider [place]  07/12/2019  8:30 AM Cameron Sprang, MD [facility] None    Lab/Order associations: Not  fasting   ICD-10-CM   1. Preventative health care  Z00.00   2. Type 2 diabetes mellitus with stage 3 chronic kidney disease, without long-term current use of insulin, unspecified whether stage 3a or 3b CKD ([facility])  E11.22 POCT HgB A1C   N18.30   3. History of prostate cancer-follows with Dr. [facility] of [facility]  Z85.46   4. Hypothyroidism, unspecified type  E03.9 TSH  5. Hyperlipidemia, unspecified hyperlipidemia type  E78.5 CBC with Differential/Platelet    Comprehensive metabolic panel    Lipid panel  6. Idiopathic chronic gout of left foot without tophus  M1A.0720 Uric Acid  7. Essential hypertension  I10   8. Stage 3 chronic kidney disease, unspecified whether stage 3a or 3b CKD  N18.30   9. Adenomatous polyp of colon, unspecified part of colon  D12.6   10. Aortic atherosclerosis ([place])  I70.0    Return precautions advised.  Garret Reddish, MD

## 2019-03-21 NOTE — Patient Instructions (Addendum)
Health Maintenance Due  Topic Date Due  . HEMOGLOBIN A1C Will do today in office.  10/26/2018  . FOOT EXAM Done today in office. Requesting records.  12/02/2018  . OPHTHALMOLOGY EXAM  Pt had in 02/2019 will request notes today.  02/13/2019   Please stop by lab before you go If you do not have mychart- we will call you about results within 5 business days of [place] receiving them.  If you have mychart- we will send your results within 3 business days of [place] receiving them.  If abnormal or we want to clarify a result, we will call or mychart you to make sure you receive the message.  If you have questions or concerns or don't hear within 5-7 days, please send [place] a message or call us.   Recommended follow up: Return in about 6 months (around 09/18/2019) for follow up- or sooner if needed.

## 2019-03-26 ENCOUNTER — Other Ambulatory Visit: Payer: Self-pay | Admitting: Family Medicine

## 2019-04-12 ENCOUNTER — Other Ambulatory Visit: Payer: Self-pay

## 2019-04-12 ENCOUNTER — Telehealth: Payer: Self-pay | Admitting: Family Medicine

## 2019-04-12 MED ORDER — ALLOPURINOL 100 MG PO TABS: 100.0000 mg | ORAL_TABLET | Freq: Two times a day (BID) | ORAL | 1 refills | Status: DC

## 2019-04-12 NOTE — Telephone Encounter (Signed)
Called to let patient know ready

## 2019-04-12 NOTE — Telephone Encounter (Signed)
Would like a call when request has been sent.

## 2019-04-12 NOTE — Telephone Encounter (Signed)
MEDICATION:allopurinol (ZYLOPRIM) 100 MG tablet  PHARMACY:[id number] - [place], [place] - [address]  Comments: Has a about 2-3 days left   **Let patient know to contact pharmacy at the end of the day to make sure medication is ready. **  ** Please notify patient to allow 48-72 hours to process**  **Encourage patient to contact the pharmacy for refills or they can request refills through [facility]**

## 2019-04-29 DIAGNOSIS — Z8546 Personal history of malignant neoplasm of prostate: Secondary | ICD-10-CM | POA: Diagnosis not present

## 2019-04-30 DIAGNOSIS — H5212 Myopia, left eye: Secondary | ICD-10-CM | POA: Diagnosis not present

## 2019-04-30 DIAGNOSIS — H524 Presbyopia: Secondary | ICD-10-CM | POA: Diagnosis not present

## 2019-04-30 DIAGNOSIS — H40053 Ocular hypertension, bilateral: Secondary | ICD-10-CM | POA: Diagnosis not present

## 2019-04-30 DIAGNOSIS — H40013 Open angle with borderline findings, low risk, bilateral: Secondary | ICD-10-CM | POA: Diagnosis not present

## 2019-04-30 DIAGNOSIS — E119 Type 2 diabetes mellitus without complications: Secondary | ICD-10-CM | POA: Diagnosis not present

## 2019-04-30 DIAGNOSIS — H52221 Regular astigmatism, right eye: Secondary | ICD-10-CM | POA: Diagnosis not present

## 2019-04-30 LAB — HM DIABETES EYE EXAM

## 2019-05-03 DIAGNOSIS — C61 Malignant neoplasm of prostate: Secondary | ICD-10-CM | POA: Diagnosis not present

## 2019-05-07 ENCOUNTER — Other Ambulatory Visit: Payer: Self-pay | Admitting: Family Medicine

## 2019-07-11 IMAGING — DX LEFT FEMUR 2 VIEWS
4 series · 4 of 4 positions shown · non-contrast
Comparison: None.

CLINICAL DATA: Left upper leg pain.

EXAM:
LEFT FEMUR 2 VIEWS

[femur ap]
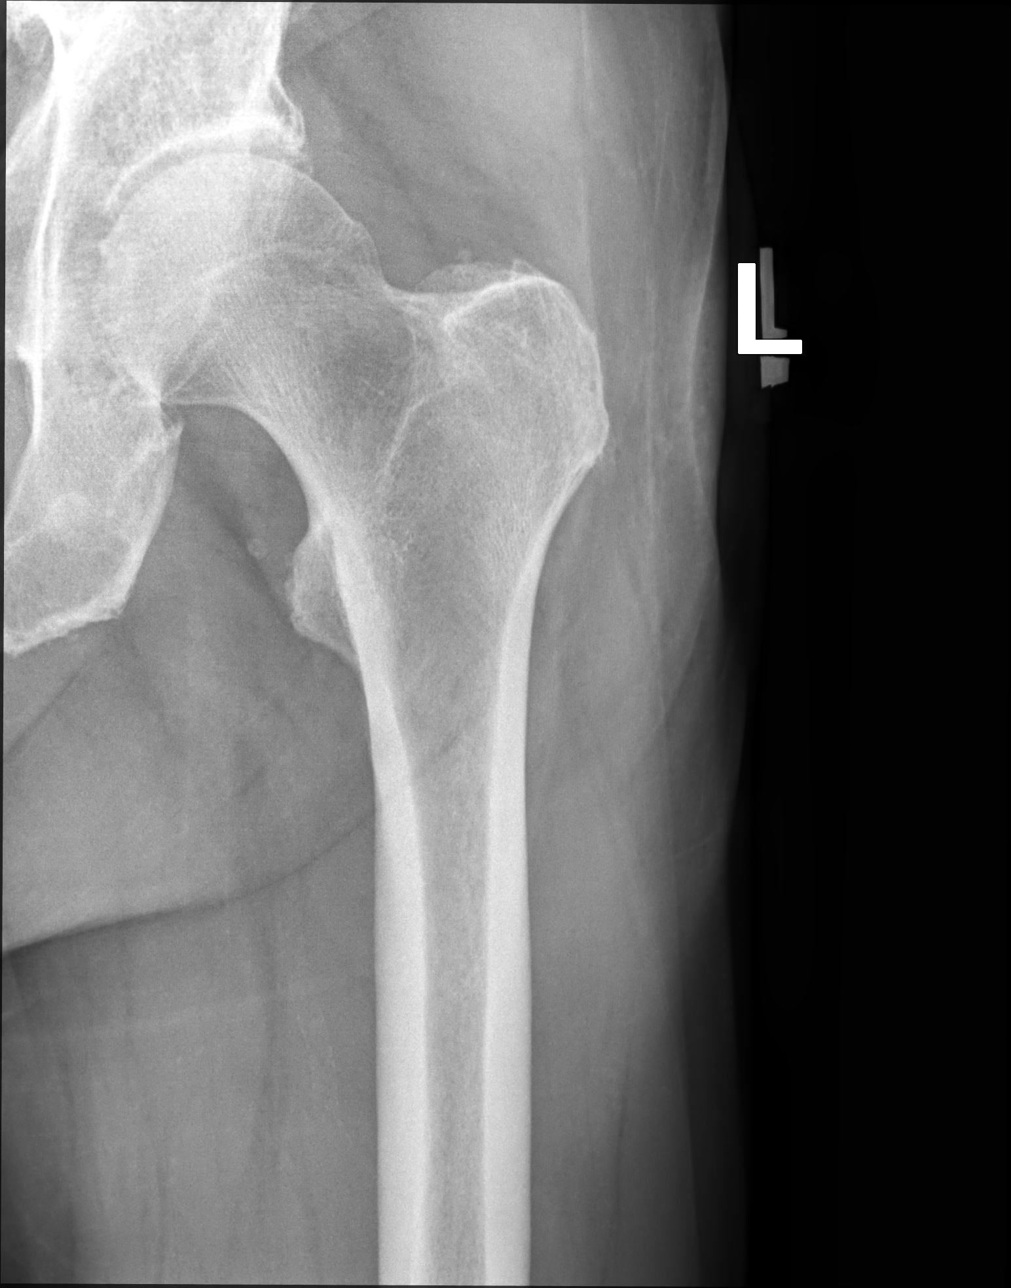

[femur lat]
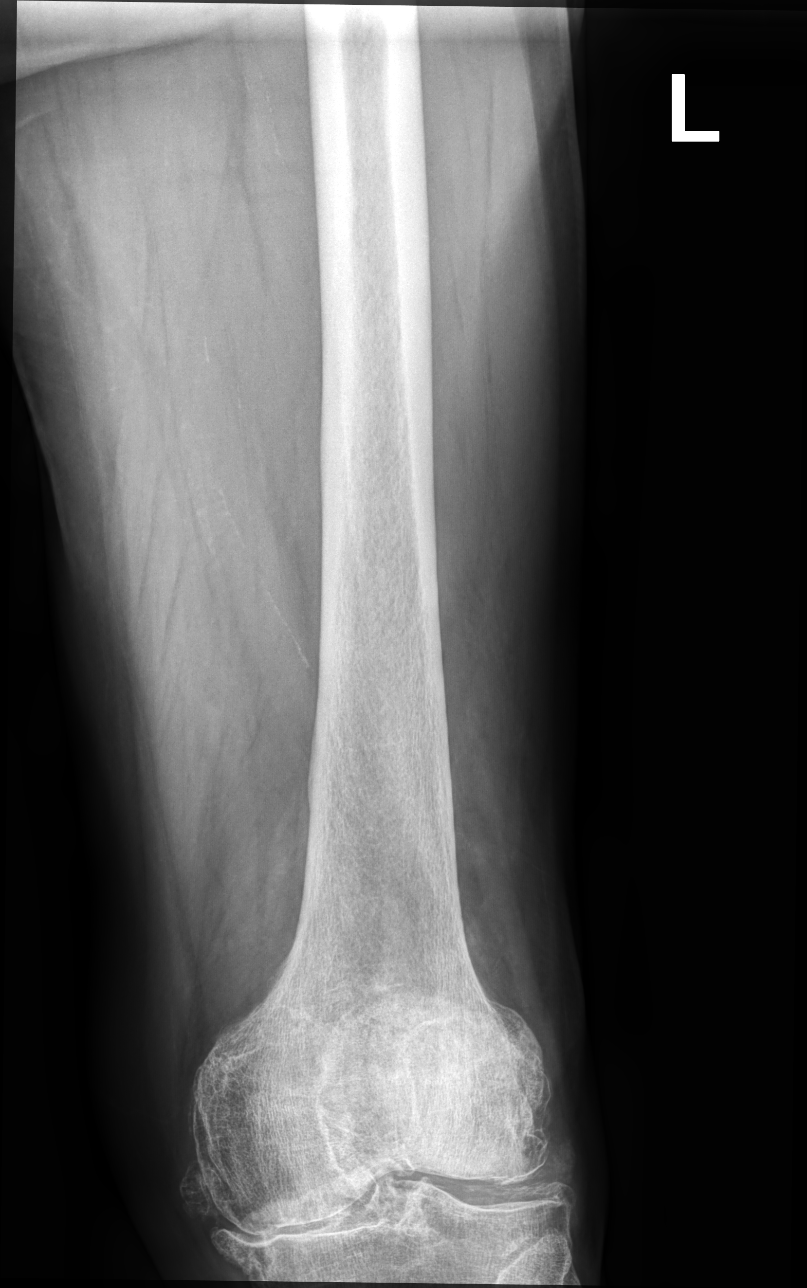

[hip joint ap]
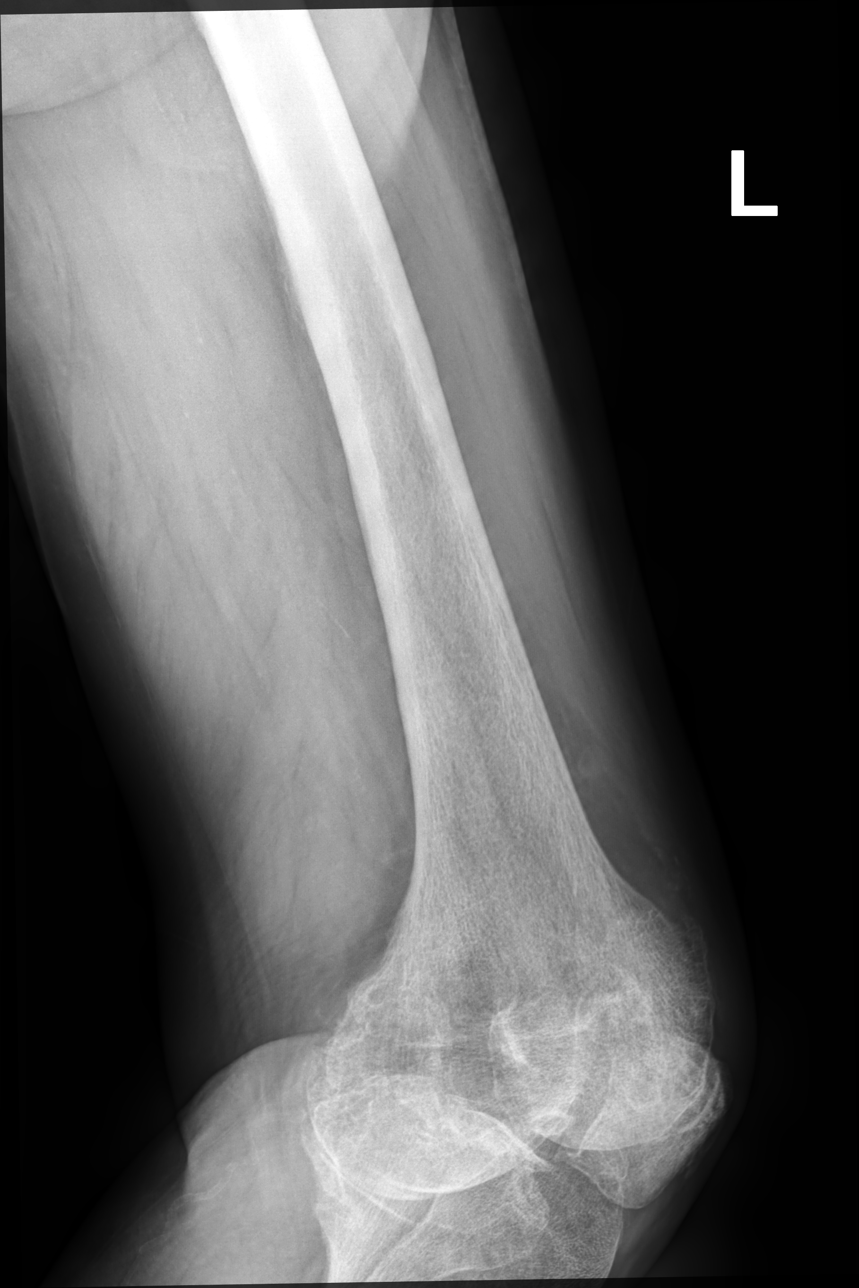

[hip cross table lat]
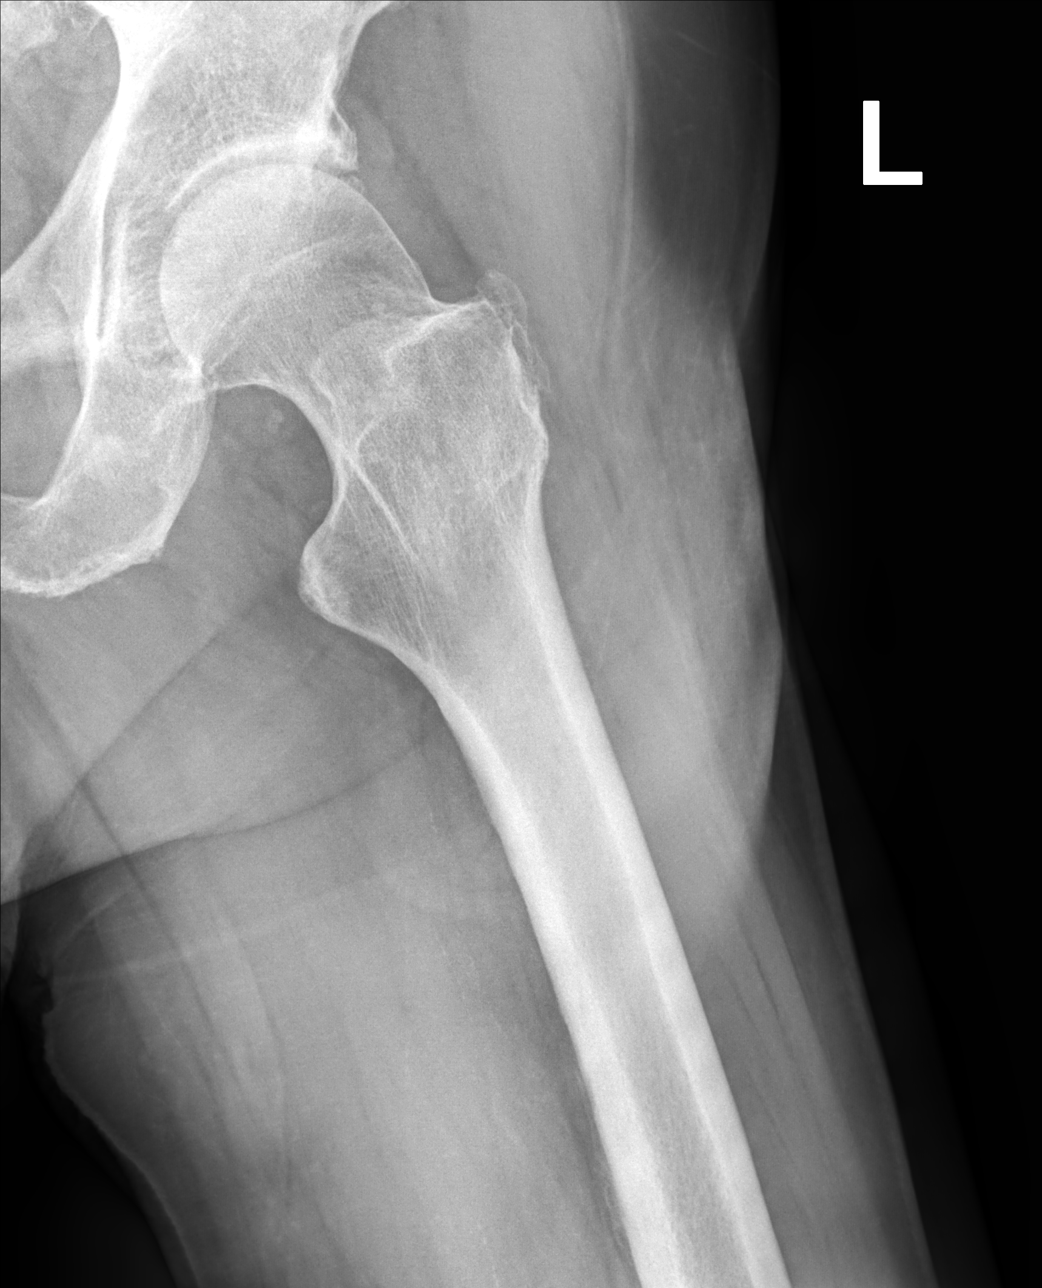

[4 of 4 positions shown; findings below may reference images not displayed]

FINDINGS: There is no evidence of fracture or dislocation. Degenerative joint
changes are identified in the left hip and left knee with narrowed
joint space and osteophyte formation.
IMPRESSION: Moderate to severe degenerative joint changes of the left knee and
moderate degenerative joint changes of left hip.

## 2019-07-12 ENCOUNTER — Ambulatory Visit: Payer: Medicare HMO | Admitting: Neurology

## 2019-07-12 ENCOUNTER — Encounter: Payer: Self-pay | Admitting: Neurology

## 2019-07-12 ENCOUNTER — Other Ambulatory Visit: Payer: Self-pay

## 2019-07-12 VITALS — BP 125/62 | HR 66 | Ht 69.0 in | Wt 210.6 lb

## 2019-07-12 DIAGNOSIS — R69 Illness, unspecified: Secondary | ICD-10-CM | POA: Diagnosis not present

## 2019-07-12 DIAGNOSIS — F0390 Unspecified dementia without behavioral disturbance: Secondary | ICD-10-CM

## 2019-07-12 DIAGNOSIS — F03A0 Unspecified dementia, mild, without behavioral disturbance, psychotic disturbance, mood disturbance, and anxiety: Secondary | ICD-10-CM

## 2019-07-12 MED ORDER — MEMANTINE HCL 10 MG PO TABS: ORAL_TABLET | ORAL | 11 refills | Status: DC

## 2019-07-12 MED ORDER — DONEPEZIL HCL 10 MG PO TABS: 10.0000 mg | ORAL_TABLET | Freq: Every day | ORAL | 3 refills | Status: DC

## 2019-07-12 NOTE — Progress Notes (Signed)
NEUROLOGY FOLLOW UP OFFICE NOTE  Eddie Ferguson. Akey LY:[id number] [birth year]  HISTORY OF PRESENT ILLNESS: I had the pleasure of seeing Eddie Ferguson in follow-up in the neurology clinic on 07/12/2019.  He is again accompanied by his wife who helps supplement the history today.  The patient was last seen 7 months ago for mild dementia. He is again accompanied by his wife who helps supplement the history today.  Records and images were personally reviewed where available.  SLUMS score 16/30 in 12/2018 ([place] 22/30 in 05/2018, 23/30 in 09/2017). He is on Donepezil 10mg  daily. Since his last visit, he feels his memory is deteriorating. He cannot recall things that he should remember, such as where he put things. His wife agrees and reports forgetfulness is more obvious, yesterday he could not recall where the batteries are, which have always been in the same place. He does not drive. He continues to be very organized with his charts for medications and finances, and denies missing any medications or bill payments. He continues to work on the computer. He is independent with dressing and bathing. No personality changes, no paranoia or hallucinations. Sleep is good. He denies any headaches, dizziness, vision changes, no falls. The lower part of his legs/feet continue to be weak, unchanged from prior reports. He has a cane and walker for longer distances.  History on Initial Assessment 03/27/2017: This is a very pleasant 79 year old right-handed man with a history of hypertension, hyperlipidemia, diabetes, hypothyroidism, presenting for evaluation of short-term memory loss. He and his wife report changes started around a year ago. He would not be able to recall where he ate the day prior. His wife reports he repeats himself and asks the same questions several times. He used to be accurate and precise, and has a very detailed spreadsheet of his medications (has similar one for bills), but now has difficulty using his computer. He is better in the morning, but later in the afternoon, he cannot  recall how he set up a worksheet or where it is in his files. He makes wrong turns while driving, but is able to get back on track easily. He occasionally misplaces things at home. His wife denies any personality changes, but he gets frustrated with himself because he is usually so precise. He has problems with multitasking. No paranoia or hallucinations. He is independent with ADLs. MMSE at his PCP office in November 2018 was 28/30. 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 His paternal grandmother had dementia. No history of significant head injuries. He drinks beer on occasion.   He has numbness in both feet. He has a left knee brace from a torn tendon. He denies any headaches, dizziness, diplopia, dysarthria/dypshagia, neck/back pain, focal numbness/tingling/weakness, bowel/bladder dysfunction, anosmia, or tremors.   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   PAST MEDICAL HISTORY: Past Medical History:  Diagnosis Date  . Allergy   . Ankle injury 06/01/2001   no surgery- skin debridement   . Arthritis   . Basal cell carcinoma 05/28/2014   L nasal tip. Mohs Dr. Link Snuffer.    . Blood transfusion    2010 because of stomach ulcers  . Cataract    bilaterally removed  . Diabetes mellitus   . GERD (gastroesophageal reflux disease)   . Glaucoma   . Hyperlipidemia   . Hypertension   .  Hypothyroidism   . Pneumonia 04/12/2006  . Prostate cancer ([place])   . Seasonal allergies   . Sinusitis    treated for bacterial infection at least once a year at [facility]  . Stomach ulcer 2010   bleeding ulcer result NSAIDS    MEDICATIONS: Current Outpatient Medications on File Prior to Visit  Medication Sig Dispense Refill  . acetaminophen  (TYLENOL) 500 MG tablet Take 500 mg by mouth every 6 (six) hours as needed. Reported on 06/16/2015    . allopurinol (ZYLOPRIM) 100 MG tablet Take 1 tablet (100 mg total) by mouth 2 (two) times daily. 180 tablet 1  . amLODipine (NORVASC) 5 MG tablet TAKE 1 TABLET BY MOUTH EVERY DAY 90 tablet 1  . carvedilol (COREG) 25 MG tablet TAKE 1/2 TABLETS BY MOUTH 2 TIMES DAILY WITH A MEAL. 90 tablet 7  . chlorthalidone (HYGROTON) 25 MG tablet TAKE 1/2-1 TABLET BY MOUTH DAILY. 90 tablet 1  . Cholecalciferol (VITAMIN D) 2000 UNITS CAPS Take by mouth daily.      Marland Kitchen donepezil (ARICEPT) 10 MG tablet Take 1 tablet (10 mg total) by mouth daily. 90 tablet 3  . famotidine (PEPCID) 20 MG tablet Take 20 mg by mouth daily.    . fexofenadine (ALLEGRA) 180 MG tablet Take 180 mg by mouth.    . fluticasone (FLONASE) 50 MCG/ACT nasal spray Place 2 sprays into both nostrils daily. 48 g 3  . KLOR-CON M20 20 MEQ tablet TAKE 2 TABLETS BY MOUTH 2 TIMES DAILY. 360 tablet 1  . levothyroxine (SYNTHROID) 50 MCG tablet TAKE 1 TABLET BY MOUTH EVERY DAY 90 tablet 3  . Multiple Vitamin (MULTIVITAMIN) tablet Take 1 tablet by mouth daily.      . NON FORMULARY CBD oil. 3 gtts qd prn    . pravastatin (PRAVACHOL) 40 MG tablet TAKE 1 TABLET BY MOUTH EVERY DAY 90 tablet 3  . quinapril (ACCUPRIL) 40 MG tablet TAKE 1 TABLET BY MOUTH EVERY DAY 90 tablet 3   No current facility-administered medications on file prior to visit.    ALLERGIES: Allergies  Allergen Reactions  . Aspirin Other (See Comments)    Stomach ulcers  . Nsaids Other (See Comments)    Stomach ulcers  . Amoxicillin-Pot Clavulanate Rash  . Sulfa Antibiotics Swelling and Rash    FAMILY HISTORY: Family History  Problem Relation Age of Onset  . Diabetes Mother   . Diabetes Father   . Hyperlipidemia Father   . Colon polyps Father   . Brain cancer Brother        smoker  . Dementia Paternal Grandmother   . Colon cancer Neg Hx   . Esophageal cancer Neg Hx   . Stomach  cancer Neg Hx   . Rectal cancer Neg Hx     SOCIAL HISTORY: Social History   Socioeconomic History  . Marital status: Married    Spouse name: Not on file  . Number of children: 2  . Years of education: 4  . Highest education level: Not on file  Occupational History  . Not on file  Tobacco Use  . Smoking status: Former Smoker    Quit date: 02/03/2001    Years since quitting: 18.4  . Smokeless tobacco: Never Used  . Tobacco comment: intermittent cigar use stopped  Substance and Sexual Activity  . Alcohol use: Yes    Alcohol/week: 0.0 standard drinks    Comment: occasional beer  . Drug use: No  . Sexual activity: Not Currently  Partners: Female  Other Topics Concern  . Not on file  Social History Narrative   Married (wife patient outside practice). 2 children. 5 grandkids.       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       Hobbies: travel, enjoys going to shows, [employer] women's basketball tournament. Doesn't watch tv.             Lives in 1 story condo on the 3rd floor   Bachelor's degree   Right handed   Social Determinants of Health   Financial Resource Strain:   . Difficulty of Paying Living Expenses:   Food Insecurity:   . Worried About Charity fundraiser in the Last Year:   . Arboriculturist in the Last Year:   Transportation Needs:   . Film/video editor (Medical):   Marland Kitchen Lack of Transportation (Non-Medical):   Physical Activity:   . Days of Exercise per Week:   . Minutes of Exercise per Session:   Stress:   . Feeling of Stress :   Social Connections:   . Frequency of Communication with Friends and Family:   . Frequency of Social Gatherings with Friends and Family:   . Attends Religious Services:   . Active Member of Clubs or Organizations:   . Attends Archivist Meetings:   Marland Kitchen Marital Status:   Intimate Partner Violence:   . Fear of Current or Ex-Partner:   . Emotionally Abused:   Marland Kitchen Physically Abused:   . Sexually Abused:      REVIEW OF SYSTEMS: Constitutional: No fevers, chills, or sweats, no generalized fatigue, change in appetite Eyes: No visual changes, double vision, eye pain Ear, nose and throat: No hearing loss, ear pain, nasal congestion, sore throat Cardiovascular: No chest pain, palpitations Respiratory:  No shortness of breath at rest or with exertion, wheezes GastrointestinaI: No nausea, vomiting, diarrhea, abdominal pain, fecal incontinence Genitourinary:  No dysuria, urinary retention or frequency Musculoskeletal:  No neck pain, back pain Integumentary: No rash, pruritus, skin lesions Neurological: as above Psychiatric: No depression, insomnia, anxiety Endocrine: No palpitations, fatigue, diaphoresis, mood swings, change in appetite, change in weight, increased thirst Hematologic/Lymphatic:  No anemia, purpura, petechiae. Allergic/Immunologic: no itchy/runny eyes, nasal congestion, recent allergic reactions, rashes  PHYSICAL EXAM: Vitals:   07/12/19 0826  BP: 125/62  Pulse: 66  SpO2: 96%   General: No acute distress Head:  Normocephalic/atraumatic Skin/Extremities: No rash, no edema Neurological Exam: alert and oriented to person, place, and time. No aphasia or dysarthria. Fund of knowledge is appropriate.  Recent and remote memory are impaired. Attention and concentration are reduced.  He drew clock numbers counterclockwise today. SLUMS 16/30 [employer] Mental Exam 12/24/2018 07/12/2019  Weekday Correct 1 1  Current year 1 1  What state are we in? 1 1  Amount spent 1 1  Amount left 2 2  # of Animals 2 2  5  objects recall 0 0  Number series 2 2  Hour markers 2 0  Time correct 0 2  Placed X in triangle correctly 1 1  Largest Figure 1 1  Name of male 2 2  Date back to work 0 0  Type of work 0 0  State she lived in 0 0  Total score 16 16   Cranial nerves: Pupils equal, round, reactive to light. Extraocular movements intact with no nystagmus. Visual fields full. No  facial asymmetry. Motor: Bulk and tone normal, muscle strength 5/5 throughout with no pronator  drift. Finger to nose testing intact.  Gait slow and cautious with cane, no ataxia   IMPRESSION: This is a very pleasant 79 yo RH man with vascular risk factors including hypertension, hyperlipidemia, diabetes, with mild dementia likely due to Alzheimer's disease. He and his wife report continued progression, clock numbers today are counterclockwise, SLUMS score 16/30. We discussed adding on Memantine to Donepezil, side effects and expectations discussed. Start Memantine 10mg  qhs x 2 weeks, then increase to 10mg  BID. Refills for Donepezil 10mg  daily sent. He does not drive. Continue close supervision, his wife was asked to check behind him with medications and finances. Follow-up in 6 months, call for any changes.   Thank you for allowing me to participate in his care.  Please do not hesitate to call for any questions or concerns.   Ellouise Newer, M.D.   CC: Dr. Yong Channel

## 2019-07-12 NOTE — Patient Instructions (Signed)
Good seeing you! Let's add on Memantine 10mg  every night for 2 weeks, then increase to 1 tablet twice a day. Continue Donepezil 10mg  daily. Follow-up in 6 months, call for any changes.  FALL PRECAUTIONS: Be cautious when walking. Scan the area for obstacles that may increase the risk of trips and falls. When getting up in the mornings, sit up at the edge of the bed for a few minutes before getting out of bed. Consider elevating the bed at the head end to avoid drop of blood pressure when getting up. Walk always in a well-lit room (use night lights in the walls). Avoid area rugs or power cords from appliances in the middle of the walkways. Use a walker or a cane if necessary and consider physical therapy for balance exercise. Get your eyesight checked regularly.  FINANCIAL OVERSIGHT: Supervision, especially oversight when making financial decisions or transactions is also recommended if difficulties start to arise.  HOME SAFETY: Consider the safety of the kitchen when operating appliances like stoves, microwave oven, and blender. Consider having supervision and share cooking responsibilities until no longer able to participate in those. Accidents with firearms and other hazards in the house should be identified and addressed as well.  ABILITY TO BE LEFT ALONE: If patient is unable to contact 911 operator, consider using LifeLine, or when the need is there, arrange for someone to stay with patients. Smoking is a fire hazard, consider supervision or cessation. Risk of wandering should be assessed by caregiver and if detected at any point, supervision and safe proof recommendations should be instituted.  MEDICATION SUPERVISION: Inability to self-administer medication needs to be constantly addressed. Implement a mechanism to ensure safe administration of the medications.  RECOMMENDATIONS FOR ALL PATIENTS WITH MEMORY PROBLEMS: 1. Continue to exercise (Recommend 30 minutes of walking everyday, or 3 hours  every week) 2. Increase social interactions - continue going to [place] and enjoy social gatherings with friends and family 3. Eat healthy, avoid fried foods and eat more fruits and vegetables 4. Maintain adequate blood pressure, blood sugar, and blood cholesterol level. Reducing the risk of stroke and cardiovascular disease also helps promoting better memory. 5. Avoid stressful situations. Live a simple life and avoid aggravations. Organize your time and prepare for the next day in anticipation. 6. Sleep well, avoid any interruptions of sleep and avoid any distractions in the bedroom that may interfere with adequate sleep quality 7. Avoid sugar, avoid sweets as there is a strong link between excessive sugar intake, diabetes, and cognitive impairment The Mediterranean diet has been shown to help patients reduce the risk of progressive memory disorders and reduces cardiovascular risk. This includes eating fish, eat fruits and green leafy vegetables, nuts like almonds and hazelnuts, walnuts, and also use olive oil. Avoid fast foods and fried foods as much as possible. Avoid sweets and sugar as sugar use has been linked to worsening of memory function.  There is always a concern of gradual progression of memory problems. If this is the case, then we may need to adjust level of care according to patient needs. Support, both to the patient and caregiver, should then be put into place.

## 2019-07-17 ENCOUNTER — Other Ambulatory Visit: Payer: Self-pay | Admitting: Family Medicine

## 2019-08-03 ENCOUNTER — Other Ambulatory Visit: Payer: Self-pay | Admitting: Neurology

## 2019-09-16 NOTE — Patient Instructions (Addendum)
poor control of reflux- encouraged twice a day pepcid and if not effective to let me know- consider omeprazole 20 mg daily as next step   Please stop by lab before you go If you have mychart- we will send your results within 3 business days of [place] receiving them.  If you do not have mychart- we will call you about results within 5 business days of [place] receiving them.   Recommended follow up:  January 14th 2022 or later for physical

## 2019-09-16 NOTE — Progress Notes (Signed)
Phone [phone] In person visit   Subjective:   Eddie Ferguson is a 79 y.o. year old very pleasant male patient who presents for/with See problem oriented charting Chief Complaint  Patient presents with   Follow-up   Diabetes   Hypertension   Gastroesophageal Reflux   Hypothyroidism   Hyperlipidemia   This visit occurred during the SARS-CoV-2 public health emergency.  Safety protocols were in place, including screening questions prior to the visit, additional usage of staff PPE, and extensive cleaning of exam room while observing appropriate contact time as indicated for disinfecting solutions.   Past Medical History-  Patient Active Problem List   Diagnosis Date Noted   Mild dementia ([place]) 09/26/2017    Priority: High   Mild cognitive impairment 06/16/2015    Priority: High   History of prostate cancer-follows with Dr. 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    Priority: High   Aortic atherosclerosis ([place]) 01/05/2018    Priority: Medium   CKD (chronic kidney disease), stage III 04/18/2017    Priority: Medium   Gout 07/09/2015    Priority: Medium   Essential hypertension 04/23/2014    Priority: Medium   Hyperlipidemia 04/23/2014    Priority: Medium   Hypothyroidism 04/23/2014    Priority: Medium   Adenomatous colon polyp 05/28/2014    Priority: Low   Osteoarthritis 05/28/2014    Priority: Low   Erectile dysfunction 05/28/2014    Priority: Low   Basal cell carcinoma 05/28/2014    Priority: Low   GERD (gastroesophageal reflux disease) 04/23/2014    Priority: Low   Allergic rhinitis 04/23/2014    Priority: Low   Glaucoma 04/23/2014    Priority: Low   History of nonmelanoma skin cancer 11/18/2012    Priority: Low   History of shingles 04/24/2011    Medications- reviewed and updated Current Outpatient Medications  Medication Sig  Dispense Refill   acetaminophen (TYLENOL) 500 MG tablet Take 500 mg by mouth every 6 (six) hours as needed. Reported on 06/16/2015     allopurinol (ZYLOPRIM) 100 MG tablet Take 1 tablet (100 mg total) by mouth 2 (two) times daily. 180 tablet 1   amLODipine (NORVASC) 5 MG tablet TAKE 1 TABLET BY MOUTH EVERY DAY 90 tablet 1   carvedilol (COREG) 25 MG tablet TAKE 1/2 TABLETS BY MOUTH 2 TIMES DAILY WITH A MEAL. 90 tablet 7   chlorthalidone (HYGROTON) 25 MG tablet TAKE 1/2-1 TABLET BY MOUTH DAILY. 90 tablet 1   Cholecalciferol (VITAMIN D) 2000 UNITS CAPS Take by mouth daily.       donepezil (ARICEPT) 10 MG tablet Take 1 tablet (10 mg total) by mouth daily. 90 tablet 3   famotidine (PEPCID) 20 MG tablet Take 20 mg by mouth daily.     fexofenadine (ALLEGRA) 180 MG tablet Take 180 mg by mouth.     fluticasone (FLONASE) 50 MCG/ACT nasal spray Place 2 sprays into both nostrils daily. 48 g 3   KLOR-CON M20 20 MEQ tablet TAKE 2 TABLETS BY MOUTH 2 TIMES DAILY. 360 tablet 1   levothyroxine (SYNTHROID) 50 MCG tablet TAKE 1 TABLET BY MOUTH EVERY DAY 90 tablet 3   memantine (NAMENDA) 10 MG tablet TAKE 1 TABLET EVERY NIGHT FOR 2 WEEKS, THEN INCREASE TO 1 TABLET TWICE A DAY AND CONTINUE 180 tablet 1   Multiple Vitamin (MULTIVITAMIN) tablet Take 1 tablet by mouth daily.  NON FORMULARY CBD oil. 3 gtts qd prn     pravastatin (PRAVACHOL) 40 MG tablet TAKE 1 TABLET BY MOUTH EVERY DAY 90 tablet 3   quinapril (ACCUPRIL) 40 MG tablet TAKE 1 TABLET BY MOUTH EVERY DAY 90 tablet 3   No current facility-administered medications for this visit.     Objective:  BP (!) 120/58    Pulse 72    Temp 98 F (36.7 C) (Temporal)    Ht 5\' 9"  (1.753 m)    Wt 209 lb (94.8 kg)    SpO2 95%    BMI 30.86 kg/m  Gen: NAD, resting comfortably CV: RRR no murmurs rubs or gallops Lungs: CTAB no crackles, wheeze, rhonchi Ext: no edema Skin: warm, dry    Assessment and Plan   # balance issues  S:has started having  some numbness in the last year or slightly more in feet. Numbness is in both feet (could be diabetes related) He has not had any falls but has started using cane due to instability (started after a trip to [place] in February or march of 2020. Has not had any physical therapy or strength training. Denies any falls.  Lab Results  Component Value Date   TSH 3.56 03/21/2019    Lab Results  Component Value Date   VITAMINB12 697 09/28/2016   A/P: balance issues- sounds like neuropathy could contribute. Could be from diabetes. Check tsh and b12 as well. Also refer to PT   -Does see Dr. Delice Lesch in [place] can check in with her about potential neuropathy-my suspicion is it is related to underlying diabetes despite good control.  Patient remains on Aricept and Namenda through Dr. Delice Lesch  #hypertension S: medication: on amlodipine 5 mg, coreg 12.5 mg BID, quinapril 40, chlorthalidone 12.5 mg- does have to take potassium on this Home readings #s: has been checking at home on average have been good has had a few that were a little hight but not much. Patient denies any chest pain, shortness of breath, changes or changes in vision. Does not add salt to food. Tries to maintain a heart healthy diet.  BP Readings from Last 3 Encounters:  09/17/19 (!) 120/58  07/12/19 125/62  03/21/19 138/74  A/P: Controlled continue current medication  # GERD S:currently on Pepcid 20mg  daily. Has had increased symptoms off and on for while. Has increase belching and "gurgling" in stomach. Denies any burning, cough or trouble swallowing.   Allergies see dose  A/P: poor control of reflux- encouraged twice a day pepcid and if not effective to let me know- consider omeprazole 20 mg daily as next step   #hypothyroidism S: compliant On thyroid medication- levothyroxine 50 mcg. Takes in the morning on empty stomach. Denies any symptoms or missed doses.  Lab Results  Component Value Date   TSH 3.56 03/21/2019   A/P:  Hopefully stable-update TSH with labs today  # Diabetes S: Medication:contolls with diet and exercise.   CBGs- checks at home about once a week or when having symptoms. Has not had any out of range.  Exercise and diet- does not exercise regularly tries to eat healthy diet.  Lab Results  Component Value Date   HGBA1C 5.9 (A) 03/21/2019   HGBA1C 6.7 (H) 04/27/2018   HGBA1C 6.1 (A) 12/01/2017    A/P: hopefully stable- update a1c today  #hyperlipidemia//aortic atherosclerosis-incidental finding S: Medication:pravastatin 40 mg Lab Results  Component Value Date   CHOL 140 03/21/2019   HDL 54.60 03/21/2019   LDLCALC 55  03/21/2019   LDLDIRECT 90.0 09/28/2016   TRIG 152.0 (H) 03/21/2019   CHOLHDL 3 03/21/2019   A/P: well controlled-continue current medications-update lipid panel in 6 months  Aortic atherosclerosis-continue risk factor modification-prefer LDL under 70  #Chronic kidney disease stage III-hopefully stable- update cmp. Does use tylenol if needed.   #Gout S: no flares in in over a year  on allopurinol.  Lab Results  Component Value Date   LABURIC 6.3 03/21/2019  A/P: Uric acid was very close to goal of 6 or less at 6.3 back in [place] has not had any flareups so we will not check blood work at this time-likely to continue current dose as long as not having flareups  #PSAs have been good for urology and has follow up scheduled  Recommended follow up:  January 14th 2022 or later for physical Future Appointments  Date Time Provider [place]  01/21/2020  8:30 AM Cameron Sprang, MD [facility] None    Lab/Order associations:   ICD-10-CM   1. Essential hypertension  I10   2. Gastroesophageal reflux disease without esophagitis  K21.9   3. Hypothyroidism, unspecified type  E03.9 TSH  4. Type 2 diabetes mellitus with stage 3a chronic kidney disease, without long-term current use of insulin (HCC)  E11.21 Hemoglobin A1c   N18.31 Comprehensive metabolic panel  5.  Hyperlipidemia, unspecified hyperlipidemia type  E78.5   6. Idiopathic chronic gout of left foot without tophus  M1A.0720   7. Gait instability  R26.81 Ambulatory referral to Physical Therapy  8. Paresthesias  R20.2 TSH    Vitamin B12  9. Stage 3 chronic kidney disease, unspecified whether stage 3a or 3b CKD  N18.30     Meds ordered this encounter  Medications   allopurinol (ZYLOPRIM) 100 MG tablet    Sig: Take 1 tablet (100 mg total) by mouth 2 (two) times daily.    Dispense:  180 tablet    Refill:  3   carvedilol (COREG) 25 MG tablet    Sig: TAKE 1/2 TABLETS BY MOUTH 2 TIMES DAILY WITH A MEAL.    Dispense:  90 tablet    Refill:  3   fluticasone (FLONASE) 50 MCG/ACT nasal spray    Sig: Place 2 sprays into both nostrils daily.    Dispense:  48 g    Refill:  3    Do not fill any medications without patient request.   potassium chloride SA (KLOR-CON M20) 20 MEQ tablet    Sig: Take 2 tablets (40 mEq total) by mouth 2 (two) times daily.    Dispense:  360 tablet    Refill:  3   Return precautions advised.  Garret Reddish, MD

## 2019-09-17 ENCOUNTER — Ambulatory Visit (INDEPENDENT_AMBULATORY_CARE_PROVIDER_SITE_OTHER): Payer: Medicare HMO | Admitting: Family Medicine

## 2019-09-17 ENCOUNTER — Other Ambulatory Visit: Payer: Self-pay

## 2019-09-17 ENCOUNTER — Encounter: Payer: Self-pay | Admitting: Family Medicine

## 2019-09-17 VITALS — BP 120/58 | HR 72 | Temp 98.0°F | Ht 69.0 in | Wt 209.0 lb

## 2019-09-17 DIAGNOSIS — R2681 Unsteadiness on feet: Secondary | ICD-10-CM

## 2019-09-17 DIAGNOSIS — R202 Paresthesia of skin: Secondary | ICD-10-CM | POA: Diagnosis not present

## 2019-09-17 DIAGNOSIS — N183 Chronic kidney disease, stage 3 unspecified: Secondary | ICD-10-CM

## 2019-09-17 DIAGNOSIS — M1A0720 Idiopathic chronic gout, left ankle and foot, without tophus (tophi): Secondary | ICD-10-CM | POA: Diagnosis not present

## 2019-09-17 DIAGNOSIS — E039 Hypothyroidism, unspecified: Secondary | ICD-10-CM | POA: Diagnosis not present

## 2019-09-17 DIAGNOSIS — I10 Essential (primary) hypertension: Secondary | ICD-10-CM | POA: Diagnosis not present

## 2019-09-17 DIAGNOSIS — E1121 Type 2 diabetes mellitus with diabetic nephropathy: Secondary | ICD-10-CM

## 2019-09-17 DIAGNOSIS — K219 Gastro-esophageal reflux disease without esophagitis: Secondary | ICD-10-CM | POA: Diagnosis not present

## 2019-09-17 DIAGNOSIS — N1831 Chronic kidney disease, stage 3a: Secondary | ICD-10-CM | POA: Diagnosis not present

## 2019-09-17 DIAGNOSIS — E785 Hyperlipidemia, unspecified: Secondary | ICD-10-CM

## 2019-09-17 LAB — COMPREHENSIVE METABOLIC PANEL
ALT: 14 U/L (ref 0–53)
AST: 15 U/L (ref 0–37)
Albumin: 4.2 g/dL (ref 3.5–5.2)
Alkaline Phosphatase: 62 U/L (ref 39–117)
BUN: 17 mg/dL (ref 6–23)
CO2: 35 mEq/L — ABNORMAL HIGH (ref 19–32)
Calcium: 9.6 mg/dL (ref 8.4–10.5)
Chloride: 99 mEq/L (ref 96–112)
Creatinine, Ser: 1.29 mg/dL (ref 0.40–1.50)
GFR: 53.41 mL/min — ABNORMAL LOW (ref >60.00)
Glucose, Bld: 105 mg/dL — ABNORMAL HIGH (ref 70–99)
Potassium: 3.5 mEq/L (ref 3.5–5.1)
Sodium: 140 mEq/L (ref 135–145)
Total Bilirubin: 0.7 mg/dL (ref 0.2–1.2)
Total Protein: 5.9 g/dL — ABNORMAL LOW (ref 6.0–8.3)

## 2019-09-17 LAB — VITAMIN B12: Vitamin B-12: 456 pg/mL (ref 211–911)

## 2019-09-17 LAB — HEMOGLOBIN A1C: Hgb A1c MFr Bld: 6.4 % (ref 4.6–6.5)

## 2019-09-17 LAB — TSH: TSH: 2.65 u[IU]/mL (ref 0.35–4.50)

## 2019-09-17 MED ORDER — CARVEDILOL 25 MG PO TABS: ORAL_TABLET | ORAL | 3 refills | Status: DC

## 2019-09-17 MED ORDER — POTASSIUM CHLORIDE CRYS ER 20 MEQ PO TBCR: 40.0000 meq | EXTENDED_RELEASE_TABLET | Freq: Two times a day (BID) | ORAL | 3 refills | Status: DC

## 2019-09-17 MED ORDER — ALLOPURINOL 100 MG PO TABS: 100.0000 mg | ORAL_TABLET | Freq: Two times a day (BID) | ORAL | 3 refills | Status: DC

## 2019-09-17 MED ORDER — FLUTICASONE PROPIONATE 50 MCG/ACT NA SUSP: 2.0000 | Freq: Every day | NASAL | 3 refills | Status: DC

## 2019-10-01 ENCOUNTER — Ambulatory Visit: Payer: Medicare HMO | Admitting: Physical Therapy

## 2019-10-10 ENCOUNTER — Ambulatory Visit (INDEPENDENT_AMBULATORY_CARE_PROVIDER_SITE_OTHER): Payer: Medicare HMO | Admitting: Physical Therapy

## 2019-10-10 ENCOUNTER — Encounter: Payer: Self-pay | Admitting: Physical Therapy

## 2019-10-10 ENCOUNTER — Other Ambulatory Visit: Payer: Self-pay

## 2019-10-10 DIAGNOSIS — R2689 Other abnormalities of gait and mobility: Secondary | ICD-10-CM

## 2019-10-15 ENCOUNTER — Encounter: Payer: Self-pay | Admitting: Physical Therapy

## 2019-10-17 ENCOUNTER — Encounter: Payer: Self-pay | Admitting: Physical Therapy

## 2019-10-17 NOTE — Therapy (Signed)
[facility] [address] [place], [place], [address] Phone: [phone]   Fax:  [phone]  Physical Therapy Evaluation  Patient Details  Name: Eddie Ferguson MRN: [id number] Date of Birth: 02/11/1941 Referring Provider (PT): Garret Reddish    Encounter Date: 10/10/2019   PT End of Session - 10/17/19 0942    Visit Number 1    Number of Visits 12    Date for PT Re-Evaluation 11/21/19    Authorization Type [employer] Medicare    PT Start Time 1300    PT Stop Time 1341    PT Time Calculation (min) 41 min    Equipment Utilized During Treatment Gait belt    Activity Tolerance Patient tolerated treatment well    Behavior During Therapy WFL for tasks assessed/performed           Past Medical History:  Diagnosis Date  . Allergy   . Ankle injury 06/01/2001   no surgery- skin debridement   . Arthritis   . Basal cell carcinoma 05/28/2014   L nasal tip. Mohs Dr. Link Snuffer.    . Blood transfusion    2010 because of stomach ulcers  . Cataract    bilaterally removed  . Diabetes mellitus   . GERD (gastroesophageal reflux disease)   . Glaucoma   . Hyperlipidemia   . Hypertension   . Hypothyroidism   . Pneumonia 04/12/2006  . Prostate cancer ([place])   . Seasonal allergies   . Sinusitis    treated for bacterial infection at least once a year at [facility]  . Stomach ulcer 2010   bleeding ulcer result NSAIDS    Past Surgical History:  Procedure Laterality Date  . abdominl abcess     03-15-2011  . BACK SURGERY    . CATARACT EXTRACTION, BILATERAL     04-07-2014, 03-17-2014  . COLONOSCOPY     2012, 5 year repeat  . CYSTOURETHROSCOPY  11/11/1998  . ESOPHAGOGASTRODUODENOSCOPY  2010   PUD  . LUM[place]OMY  2007   [place]  . MOHS SURGERY     left side of nose  . POLYPECTOMY    . PROSTATE BIOPSY     radiation 2016  . RECONSTRUCTION TENDON PULLEY W/ TENDON / FASCIAL GRAFT OF HAND / FINGER  1982   rt 3rd finger  . RECONSTRUCTION  TENDON PULLEY W/ TENDON / FASCIAL GRAFT OF HAND / FINGER  1953   rt 5th finger  . UPPER GASTROINTESTINAL ENDOSCOPY      There were no vitals filed for this visit.    Subjective Assessment - 10/17/19 0942    Subjective Pt and wife report decreased balance, for about 6 months. Has been using small based quad cane, also has walker at home. Has not had any falls, but did fall this am in the shower, was able to get himself back up. Pt lives in [place], on 3rd floor.. uses elvator up. Notes mild pain in bil ankles from old crush injury. Wife states onet of dementia in pt, so she helps with history.    Pertinent History Previous CA, DM, CKD    Limitations Standing;Walking;House hold activities    Patient Stated Goals Improved balance    Currently in Pain? No/denies              [facility] PT Assessment - 10/17/19 0001      Assessment   Medical Diagnosis Balance, weakness    Referring Provider (PT) Garret Reddish     Prior Therapy no  Precautions   Precautions Fall      Balance Screen   Has the patient fallen in the past 6 months Yes    How many times? 1    Has the patient had a decrease in activity level because of a fear of falling?  No    Is the patient reluctant to leave their home because of a fear of falling?  No      Prior Function   Level of Independence Independent      Cognition   Overall Cognitive Status Within Functional Limits for tasks assessed      AROM   Overall AROM Comments Lumbar: mild/mod limitations,  Hips: WFL, Knees: WFL.       Strength   Overall Strength Comments Hips: 4/5, Knees: 4+/5       Ambulation/Gait   Gait Comments Using small based quad cane,       Balance   Balance Assessed Yes      Standardized Balance Assessment   Standardized Balance Assessment Berg Balance Test;Timed Up and Go Test;Dynamic Gait Index      Berg Balance Test   Sit to Stand Able to stand  independently using hands    Standing Unsupported Able to stand safely 2 minutes     Sitting with Back Unsupported but Feet Supported on Floor or Stool Able to sit safely and securely 2 minutes    Stand to Sit Sits safely with minimal use of hands    Transfers Able to transfer safely, definite need of hands    Standing Unsupported with Eyes Closed Able to stand 10 seconds with supervision    Standing Unsupported with Feet Together Able to place feet together independently and stand for 1 minute with supervision    From Standing, Reach Forward with Outstretched Arm Can reach forward >12 cm safely (5")    From Standing Position, Pick up Object from Floor Able to pick up shoe, needs supervision    From Standing Position, Turn to Look Behind Over each Shoulder Looks behind one side only/other side shows less weight shift    Turn 360 Degrees Able to turn 360 degrees safely but slowly    Standing Unsupported, Alternately Place Feet on Step/Stool Able to complete 4 steps without aid or supervision    Standing Unsupported, One Foot in Front Needs help to step but can hold 15 seconds    Standing on One Leg Tries to lift leg/unable to hold 3 seconds but remains standing independently    Total Score 39      Dynamic Gait Index   Level Surface Mild Impairment    Change in Gait Speed Mild Impairment    Gait with Horizontal Head Turns Mild Impairment    Gait with Vertical Head Turns Moderate Impairment    Gait and Pivot Turn Mild Impairment    Step Over Obstacle Moderate Impairment    Step Around Obstacles Mild Impairment    Steps Mild Impairment    Total Score 14      Timed Up and Go Test   TUG Comments 19.07 quad cane                       Objective measurements completed on examination: See above findings.               PT Education - 10/17/19 0942    Education Details PT POC, Exam findings, home safety, need for non-slip mat in tub and grab bars  Person(s) Educated Patient    Methods Explanation;Demonstration;Verbal cues    Comprehension  Verbalized understanding;Returned demonstration;Verbal cues required;Need further instruction            PT Short Term Goals - 10/17/19 0942      PT SHORT TERM GOAL #1   Title Pt to be independent with initial HEP    Time 2    Period Weeks    Status New    Target Date 10/24/19             PT Long Term Goals - 10/17/19 0942      PT LONG TERM GOAL #1   Title Pt to be independent with final HEP    Time 6    Period Weeks    Status New    Target Date 11/21/19      PT LONG TERM GOAL #2   Title Pt to demo safe, independent ability for navigating at least 5 steps, with 1 hand rail and LRAD.    Time 6    Period Weeks    Status New    Target Date 11/21/19      PT LONG TERM GOAL #3   Title Pt to have improved score on BERG by at least  5 points    Time 6    Period Weeks    Status New    Target Date 11/21/19      PT LONG TERM GOAL #4   Title Pt to show improved score on DGI by at least 4 points.    Time 6    Period Weeks    Status New    Target Date 11/21/19      PT LONG TERM GOAL #5   Title Pt to demo ability for safe ambulation with LRAD, for up to 500 ft, with dynamic balance WFL for pt age.    Time 6    Period Weeks    Status New    Target Date 11/21/19                  Plan - 10/17/19 0825    Clinical Impression Statement Pt presents with primary complaint of decreased balance and confidence with ambulation , mobility and stairs. Pt reports only 1 fall lately, but has had some near falls. Pt with decreased scores on balance testing today. He is relying on small based quad cane at this time, would benefit from straight cane for more focused weight bearing onto cane. Pt with decreased dynamic balance with stairs, direction changes and bending, reaching. Pt to benefit from skilled PT to improve deficits and improve safety .    Personal Factors and Comorbidities Comorbidity 1    Comorbidities dementia, hard to remember safety cues    Examination-Activity  Limitations Bathing;Locomotion Level;Bend;Carry;Squat;Stairs;Lift;Stand    Examination-Participation Restrictions Meal Prep;Cleaning;Community Activity;Shop;Yard Work    Stability/Clinical Decision Making Stable/Uncomplicated    Clinical Decision Making Low    Rehab Potential Good    PT Frequency 2x / week    PT Duration 6 weeks    PT Treatment/Interventions ADLs/Self Care Home Management;Cryotherapy;Electrical Stimulation;DME Instruction;Ultrasound;Traction;Moist Heat;Iontophoresis 4mg /ml Dexamethasone;Gait training;Stair training;Functional mobility training;Therapeutic activities;Therapeutic exercise;Balance training;Neuromuscular re-education;Manual techniques;Orthotic Fit/Training;Patient/family education;Passive range of motion;Dry needling;Energy conservation;Joint Manipulations;Spinal Manipulations;Taping    Consulted and Agree with Plan of Care Patient           Patient will benefit from skilled therapeutic intervention in order to improve the following deficits and impairments:  Abnormal gait, Difficulty walking, Decreased safety awareness, Decreased  activity tolerance, Pain, Impaired flexibility, Decreased balance, Decreased mobility, Decreased strength  Visit Diagnosis: Other abnormalities of gait and mobility     Problem List Patient Active Problem List   Diagnosis Date Noted  . Aortic atherosclerosis ([place]) 01/05/2018  . Mild dementia ([place]) 09/26/2017  . CKD (chronic kidney disease), stage III 04/18/2017  . Gout 07/09/2015  . Mild cognitive impairment 06/16/2015  . Adenomatous colon polyp 05/28/2014  . History of prostate cancer-follows with [facility] 05/28/2014  . Osteoarthritis 05/28/2014  . Erectile dysfunction 05/28/2014  . Basal cell carcinoma 05/28/2014  . Essential hypertension 04/23/2014  . Hyperlipidemia 04/23/2014  . GERD (gastroesophageal reflux disease) 04/23/2014  . Type 2 diabetes mellitus with diabetic chronic kidney disease ([place])  04/23/2014  . Hypothyroidism 04/23/2014  . Allergic rhinitis 04/23/2014  . Glaucoma 04/23/2014  . History of nonmelanoma skin cancer 11/18/2012  . History of shingles 04/24/2011    Lyndee Hensen, PT, DPT 9:43 AM  10/17/19    [facility] [address] [place], [place], [address] Phone: [phone]   Fax:  [phone]  Name: Eddie Ferguson MRN: [id number] Date of Birth: May 29, 1940

## 2019-10-18 ENCOUNTER — Other Ambulatory Visit: Payer: Self-pay

## 2019-10-18 ENCOUNTER — Ambulatory Visit: Payer: Medicare HMO | Admitting: Physical Therapy

## 2019-10-18 ENCOUNTER — Encounter: Payer: Self-pay | Admitting: Physical Therapy

## 2019-10-18 DIAGNOSIS — R2689 Other abnormalities of gait and mobility: Secondary | ICD-10-CM | POA: Diagnosis not present

## 2019-10-18 NOTE — Patient Instructions (Signed)
Access Code: [email or web address] URL: [email or web address] Date: 10/18/2019 Prepared by: Lyndee Hensen  Exercises Seated Knee Extension AROM - 1 x daily - 2 sets - 10 reps Standing March with Counter Support - 1 x daily - 2 sets - 10 reps Standing Hip Abduction with Counter Support - 1 x daily - 2 sets - 10 reps Heel rises with counter support - 1 x daily - 2 sets - 10 reps

## 2019-10-18 NOTE — Therapy (Signed)
[facility] [address] [place], [place], [address] Phone: [phone]   Fax:  [phone]  Physical Therapy Treatment  Patient Details  Name: Eddie Ferguson MRN: [id number] Date of Birth: Oct 31, 1940 Referring Provider (PT): Garret Reddish    Encounter Date: 10/18/2019   PT End of Session - 10/18/19 1050    Visit Number 2    Number of Visits 12    Date for PT Re-Evaluation 11/21/19    Authorization Type Aetna Medicare    PT Start Time 1020    PT Stop Time 1100    PT Time Calculation (min) 40 min    Equipment Utilized During Treatment Gait belt    Activity Tolerance Patient tolerated treatment well    Behavior During Therapy WFL for tasks assessed/performed           Past Medical History:  Diagnosis Date  . Allergy   . Ankle injury 06/01/2001   no surgery- skin debridement   . Arthritis   . Basal cell carcinoma 05/28/2014   L nasal tip. Mohs Dr. Link Snuffer.    . Blood transfusion    2010 because of stomach ulcers  . Cataract    bilaterally removed  . Diabetes mellitus   . GERD (gastroesophageal reflux disease)   . Glaucoma   . Hyperlipidemia   . Hypertension   . Hypothyroidism   . Pneumonia 04/12/2006  . Prostate cancer ([place])   . Seasonal allergies   . Sinusitis    treated for bacterial infection at least once a year at [facility]  . Stomach ulcer 2010   bleeding ulcer result NSAIDS    Past Surgical History:  Procedure Laterality Date  . abdominl abcess     03-15-2011  . BACK SURGERY    . CATARACT EXTRACTION, BILATERAL     04-07-2014, 03-17-2014  . COLONOSCOPY     2012, 5 year repeat  . CYSTOURETHROSCOPY  11/11/1998  . ESOPHAGOGASTRODUODENOSCOPY  2010   PUD  . LUMBAR LAMINECTOMY  2007   [place]  . MOHS SURGERY     left side of nose  . POLYPECTOMY    . PROSTATE BIOPSY     radiation 2016  . RECONSTRUCTION TENDON PULLEY W/ TENDON / FASCIAL GRAFT OF HAND / FINGER  1982   rt 3rd finger  . RECONSTRUCTION  TENDON PULLEY W/ TENDON / FASCIAL GRAFT OF HAND / FINGER  1953   rt 5th finger  . UPPER GASTROINTESTINAL ENDOSCOPY      There were no vitals filed for this visit.   Subjective Assessment - 10/18/19 1609    Subjective Pt with no new complaints today , did get non slip mat for shower    Currently in Pain? No/denies                             [facility] Adult PT Treatment/Exercise - 10/18/19 0001      Ambulation/Gait   Gait Comments Cane: 35 ft x 6:   no AD: 35 ft x 6:       Exercises   Exercises Knee/Hip      Knee/Hip Exercises: Standing   Heel Raises 15 reps    Hip Flexion 20 reps;Knee bent    Hip Abduction 20 reps;Both    Functional Squat 15 reps    Functional Squat Limitations mini/ at counter    Other Standing Knee Exercises L/R and A/P weight shifts x25 each;  Staggered stance  weight shifts x 25 bil;  L/R reach with weight shifts x 15:        Knee/Hip Exercises: Seated   Long Arc Quad 20 reps    Long Arc Quad Weight 2 lbs.    Marching 20 reps    Marching Limitations 2lb weights    Sit to [employer]                  PT Education - 10/18/19 1609    Education Details Initial HEP reviewed    Person(s) Educated Patient    Methods Explanation;Demonstration;Verbal cues;Handout    Comprehension Verbalized understanding;Returned demonstration;Verbal cues required;Need further instruction            PT Short Term Goals - 10/17/19 0942      PT SHORT TERM GOAL #1   Title Pt to be independent with initial HEP    Time 2    Period Weeks    Status New    Target Date 10/24/19             PT Long Term Goals - 10/17/19 0942      PT LONG TERM GOAL #1   Title Pt to be independent with final HEP    Time 6    Period Weeks    Status New    Target Date 11/21/19      PT LONG TERM GOAL #2   Title Pt to demo safe, independent ability for navigating at least 5 steps, with 1 hand rail and LRAD.    Time 6    Period Weeks    Status New    Target  Date 11/21/19      PT LONG TERM GOAL #3   Title Pt to have improved score on BERG by at least  5 points    Time 6    Period Weeks    Status New    Target Date 11/21/19      PT LONG TERM GOAL #4   Title Pt to show improved score on DGI by at least 4 points.    Time 6    Period Weeks    Status New    Target Date 11/21/19      PT LONG TERM GOAL #5   Title Pt to demo ability for safe ambulation with LRAD, for up to 500 ft, with dynamic balance WFL for pt age.    Time 6    Period Weeks    Status New    Target Date 11/21/19                 Plan - 10/18/19 1610    Clinical Impression Statement Pt educated on Safe HEP for LE strength. Noted today that pt does not put much weight at all onto cane, but reports that he feels very unsteady and unsure without it. Pt with no increased gait deficits with amublation wihtout cane today, but does have quite a bit of increased fear with this. recommended continued use at home.Very challenged with balance exercises today, plan to progress as tolerated.    Personal Factors and Comorbidities Comorbidity 1    Comorbidities dementia, hard to remember safety cues    Examination-Activity Limitations Bathing;Locomotion Level;Bend;Carry;Squat;Stairs;Lift;Stand    Examination-Participation Restrictions Meal Prep;Cleaning;Community Activity;Shop;Yard Work    Stability/Clinical Decision Making Stable/Uncomplicated    Rehab Potential Good    PT Frequency 2x / week    PT Duration 6 weeks    PT Treatment/Interventions ADLs/Self Care Home Management;Cryotherapy;Dealer  Stimulation;DME Instruction;Ultrasound;Traction;Moist Heat;Iontophoresis 4mg /ml Dexamethasone;Gait training;Stair training;Functional mobility training;Therapeutic activities;Therapeutic exercise;Balance training;Neuromuscular re-education;Manual techniques;Orthotic Fit/Training;Patient/family education;Passive range of motion;Dry needling;Energy conservation;Joint Manipulations;Spinal  Manipulations;Taping    Consulted and Agree with Plan of Care Patient           Patient will benefit from skilled therapeutic intervention in order to improve the following deficits and impairments:  Abnormal gait, Difficulty walking, Decreased safety awareness, Decreased activity tolerance, Pain, Impaired flexibility, Decreased balance, Decreased mobility, Decreased strength  Visit Diagnosis: Other abnormalities of gait and mobility     Problem List Patient Active Problem List   Diagnosis Date Noted  . Aortic atherosclerosis ([place]) 01/05/2018  . Mild dementia ([place]) 09/26/2017  . CKD (chronic kidney disease), stage III 04/18/2017  . Gout 07/09/2015  . Mild cognitive impairment 06/16/2015  . Adenomatous colon polyp 05/28/2014  . History of prostate cancer-follows with [facility] Al[facility] 05/28/2014  . Osteoarthritis 05/28/2014  . Erectile dysfunction 05/28/2014  . Basal cell carcinoma 05/28/2014  . Essential hypertension 04/23/2014  . Hyperlipidemia 04/23/2014  . GERD (gastroesophageal reflux disease) 04/23/2014  . Type 2 diabetes mellitus with diabetic chronic kidney disease ([place]) 04/23/2014  . Hypothyroidism 04/23/2014  . Allergic rhinitis 04/23/2014  . Glaucoma 04/23/2014  . History of nonmelanoma skin cancer 11/18/2012  . History of shingles 04/24/2011   Lyndee Hensen, PT, DPT 4:12 PM  10/18/19    [facility] [address] [place], [place], [address] Phone: [phone]   Fax:  [phone]  Name: Eddie Ferguson MRN: [id number] Date of Birth: [birth year]

## 2019-10-22 ENCOUNTER — Other Ambulatory Visit: Payer: Self-pay

## 2019-10-22 ENCOUNTER — Ambulatory Visit: Payer: Medicare HMO | Admitting: Physical Therapy

## 2019-10-22 DIAGNOSIS — R2689 Other abnormalities of gait and mobility: Secondary | ICD-10-CM | POA: Diagnosis not present

## 2019-10-24 ENCOUNTER — Encounter: Payer: Self-pay | Admitting: Family Medicine

## 2019-10-24 ENCOUNTER — Ambulatory Visit: Payer: Medicare HMO | Admitting: Physical Therapy

## 2019-10-24 ENCOUNTER — Ambulatory Visit (INDEPENDENT_AMBULATORY_CARE_PROVIDER_SITE_OTHER): Payer: Medicare HMO | Admitting: Family Medicine

## 2019-10-24 ENCOUNTER — Encounter: Payer: Self-pay | Admitting: Physical Therapy

## 2019-10-24 ENCOUNTER — Other Ambulatory Visit: Payer: Self-pay

## 2019-10-24 VITALS — BP 146/76 | HR 64 | Temp 98.0°F | Ht 69.0 in | Wt 206.6 lb

## 2019-10-24 DIAGNOSIS — L089 Local infection of the skin and subcutaneous tissue, unspecified: Secondary | ICD-10-CM | POA: Diagnosis not present

## 2019-10-24 DIAGNOSIS — L720 Epidermal cyst: Secondary | ICD-10-CM | POA: Diagnosis not present

## 2019-10-24 DIAGNOSIS — R2689 Other abnormalities of gait and mobility: Secondary | ICD-10-CM

## 2019-10-24 MED ORDER — CEPHALEXIN 500 MG PO CAPS: 500.0000 mg | ORAL_CAPSULE | Freq: Three times a day (TID) | ORAL | 0 refills | Status: DC

## 2019-10-24 NOTE — Therapy (Signed)
[facility] [address] [place], [place], [address] Phone: [phone]   Fax:  [phone]  Physical Therapy Treatment  Patient Details  Name: Eddie Ferguson. Pickar MRN: [id number] Date of Birth: [birth year] Referring Provider (PT): Garret Reddish    Encounter Date: 10/24/2019   PT End of Session - 10/24/19 1209    Visit Number 4    Number of Visits 12    Date for PT Re-Evaluation 11/21/19    Authorization Type Aetna Medicare    PT Start Time 0935    PT Stop Time 1014    PT Time Calculation (min) 39 min    Equipment Utilized During Treatment Gait belt    Activity Tolerance Patient tolerated treatment well    Behavior During Therapy WFL for tasks assessed/performed           Past Medical History:  Diagnosis Date  . Allergy   . Ankle injury 06/01/2001   no surgery- skin debridement   . Arthritis   . Basal cell carcinoma 05/28/2014   L nasal tip. Mohs Dr. Link Snuffer.    . Blood transfusion    2010 because of stomach ulcers  . Cataract    bilaterally removed  . Diabetes mellitus   . GERD (gastroesophageal reflux disease)   . Glaucoma   . Hyperlipidemia   . Hypertension   . Hypothyroidism   . Pneumonia 04/12/2006  . Prostate cancer ([place])   . Seasonal allergies   . Sinusitis    treated for bacterial infection at least once a year at [facility]  . Stomach ulcer 2010   bleeding ulcer result NSAIDS    Past Surgical History:  Procedure Laterality Date  . abdominl abcess     03-15-2011  . BACK SURGERY    . CATARACT EXTRACTION, BILATERAL     04-07-2014, 03-17-2014  . COLONOSCOPY     2012, 5 year repeat  . CYSTOURETHROSCOPY  11/11/1998  . ESOPHAGOGASTRODUODENOSCOPY  2010   PUD  . LUM[place]OMY  [place]   [place]  . MOHS SURGERY     left side of nose  . POLYPECTOMY    . PROSTATE BIOPSY     radiation 2016  . RECONSTRUCTION TENDON PULLEY W/ TENDON / FASCIAL GRAFT OF HAND / FINGER  1982   rt 3rd finger  . RECONSTRUCTION  TENDON PULLEY W/ TENDON / FASCIAL GRAFT OF HAND / FINGER  1953   rt 5th finger  . UPPER GASTROINTESTINAL ENDOSCOPY      There were no vitals filed for this visit.   Subjective Assessment - 10/24/19 1209    Subjective Pt with no new complaints.    Currently in Pain? No/denies                             [facility] Adult PT Treatment/Exercise - 10/24/19 1008      Ambulation/Gait   Gait Comments no AD: 35 ft x 8 with close supervision ;       Exercises   Exercises Knee/Hip      Knee/Hip Exercises: Standing   Heel Raises --    Hip Flexion 20 reps;Knee bent    Hip Abduction 20 reps;Both    Forward Step Up 10 reps;Both;Hand Hold: 1;Step Height: 6"    Stairs up/down 5 steps with 2 hand hold, 6 in, x 4;     Other Standing Knee Exercises L/R and A/P weight shifts x25 each;  Staggered stance weight  shifts x 25 bil;  L;     Other Standing Knee Exercises Toe taps on 6 in step x 20;       Knee/Hip Exercises: Seated   Long Arc Quad 20 reps    Long Arc Quad Weight 2 lbs.    Sit to [employer] 10 reps      Knee/Hip Exercises: Supine   Quad Sets 20 reps;Left    Straight Leg Raises 15 reps;Both    Straight Leg Raises Limitations --                    PT Short Term Goals - 10/17/19 0942      PT SHORT TERM GOAL #1   Title Pt to be independent with initial HEP    Time 2    Period Weeks    Status New    Target Date 10/24/19             PT Long Term Goals - 10/17/19 0942      PT LONG TERM GOAL #1   Title Pt to be independent with final HEP    Time 6    Period Weeks    Status New    Target Date 11/21/19      PT LONG TERM GOAL #2   Title Pt to demo safe, independent ability for navigating at least 5 steps, with 1 hand rail and LRAD.    Time 6    Period Weeks    Status New    Target Date 11/21/19      PT LONG TERM GOAL #3   Title Pt to have improved score on BERG by at least  5 points    Time 6    Period Weeks    Status New    Target Date 11/21/19       PT LONG TERM GOAL #4   Title Pt to show improved score on DGI by at least 4 points.    Time 6    Period Weeks    Status New    Target Date 11/21/19      PT LONG TERM GOAL #5   Title Pt to demo ability for safe ambulation with LRAD, for up to 500 ft, with dynamic balance WFL for pt age.    Time 6    Period Weeks    Status New    Target Date 11/21/19                 Plan - 10/24/19 1209    Clinical Impression Statement Pt with no increased gait deficits without cane, but is very reluctant to discontinue use, recommend he continue to use to decrease fear factor and improve safety. Pt also reliant on his knee brace, did not wear to session today, and feels less stable. Pt does have weakness and instability in L knee, that we have been addressing with ther ex and balance activiteis. Pt somewhat reluctant for exercise with L knee, as he does not want to hurt it, discussed benefits of strengthening today. Pt progressing well with strength and stability .    Personal Factors and Comorbidities Comorbidity 1    Comorbidities dementia, hard to remember safety cues    Examination-Activity Limitations Bathing;Locomotion Level;Bend;Carry;Squat;Stairs;Lift;Stand    Examination-Participation Restrictions Meal Prep;Cleaning;Community Activity;Shop;Yard Work    Stability/Clinical Decision Making Stable/Uncomplicated    Rehab Potential Good    PT Frequency 2x / week    PT Duration 6 weeks    PT Treatment/Interventions  ADLs/Self Care Home Management;Cryotherapy;Electrical Stimulation;DME Instruction;Ultrasound;Traction;Moist Heat;Iontophoresis 4mg /ml Dexamethasone;Gait training;Stair training;Functional mobility training;Therapeutic activities;Therapeutic exercise;Balance training;Neuromuscular re-education;Manual techniques;Orthotic Fit/Training;Patient/family education;Passive range of motion;Dry needling;Energy conservation;Joint Manipulations;Spinal Manipulations;Taping    Consulted and Agree  with Plan of Care Patient           Patient will benefit from skilled therapeutic intervention in order to improve the following deficits and impairments:  Abnormal gait, Difficulty walking, Decreased safety awareness, Decreased activity tolerance, Pain, Impaired flexibility, Decreased balance, Decreased mobility, Decreased strength  Visit Diagnosis: Other abnormalities of gait and mobility     Problem List Patient Active Problem List   Diagnosis Date Noted  . Aortic atherosclerosis ([place]) 01/05/2018  . Mild dementia ([place]) 09/26/2017  . CKD (chronic kidney disease), stage III 04/18/2017  . Gout 07/09/2015  . Mild cognitive impairment 06/16/2015  . Adenomatous colon polyp 05/28/2014  . History of prostate cancer-follows with Dr. Alinda Money of [facility] 05/28/2014  . Osteoarthritis 05/28/2014  . Erectile dysfunction 05/28/2014  . Basal cell carcinoma 05/28/2014  . Essential hypertension 04/23/2014  . Hyperlipidemia 04/23/2014  . GERD (gastroesophageal reflux disease) 04/23/2014  . Type 2 diabetes mellitus with diabetic chronic kidney disease ([place]) 04/23/2014  . Hypothyroidism 04/23/2014  . Allergic rhinitis 04/23/2014  . Glaucoma 04/23/2014  . History of nonmelanoma skin cancer 11/18/2012  . History of shingles 04/24/2011    Lyndee Hensen, PT, DPT 12:12 PM  10/24/19    [facility] [place], [place], [address] Phone: [phone]   Fax:  [phone]  Name: Mareon Robinette. Kats MRN: [id number] Date of Birth: [birth year]

## 2019-10-24 NOTE — Patient Instructions (Addendum)
1) sending in keflex for you to take for infection. Will take one pill three times a day for 7 days.  Fever/chills/spreading redness: ER  Epidermal Cyst  An epidermal cyst is a small, painless lump under your skin. The cyst contains a grayish-white, bad-smelling substance (keratin). Do not try to pop or open an epidermal cyst yourself. What are the causes?  A blocked hair follicle.  A hair that curls and re-enters the skin instead of growing straight out of the skin.  A blocked pore.  Irritated skin.  An injury to the skin.  Certain conditions that are passed along from parent to child (inherited).  Human papillomavirus (HPV).  Long-term sun damage to the skin. What increases the risk?  Having acne.  Being overweight.  Being 68-28 years old. What are the signs or symptoms? These cysts are usually harmless, but they can get infected. Symptoms of infection may include:  Redness.  Inflammation.  Tenderness.  Warmth.  Fever.  A grayish-white, bad-smelling substance drains from the cyst.  Pus drains from the cyst. How is this treated? In many cases, epidermal cysts go away on their own without treatment. If a cyst becomes infected, treatment may include:  Opening and draining the cyst, done by a doctor. After draining, you may need minor surgery to remove the rest of the cyst.  Antibiotic medicine.  Shots of medicines (steroids) that help to reduce inflammation.  Surgery to remove the cyst. Surgery may be done if the cyst: ? Becomes large. ? Bothers you. ? Has a chance of turning into cancer.  Do not try to open a cyst yourself. Follow these instructions at home:  Take over-the-counter and prescription medicines only as told by your doctor.  If you were prescribed an antibiotic medicine, take it it as told by your doctor. Do not stop using the antibiotic even if you start to feel better.  Keep the area around your cyst clean and dry.  Wear loose, dry  clothing.  Avoid touching your cyst.  Check your cyst every day for signs of infection. Check for: ? Redness, swelling, or pain. ? Fluid or blood. ? Warmth. ? Pus or a bad smell.  Keep all follow-up visits as told by your doctor. This is important. How is this prevented?  Wear clean, dry, clothing.  Avoid wearing tight clothing.  Keep your skin clean and dry. Take showers or baths every day. Contact a doctor if:  Your cyst has symptoms of infection.  Your condition does not improve or gets worse.  You have a cyst that looks different from other cysts you have had.  You have a fever. Get help right away if:  Redness spreads from the cyst into the area close by. Summary  An epidermal cyst is a sac made of skin tissue.  If a cyst becomes infected, treatment may include surgery to open and drain the cyst, or to remove it.  Take over-the-counter and prescription medicines only as told by your doctor.  Contact a doctor if your condition is not improving or is getting worse.  Keep all follow-up visits as told by your doctor. This is important. This information is not intended to replace advice given to you by your health care provider. Make sure you discuss any questions you have with your health care provider. Document Revised: 06/14/2018 Document Reviewed: 11/30/2017 Elsevier Patient Education  2020 [employer].

## 2019-10-24 NOTE — Therapy (Signed)
[facility] [address] [place], [place], [address] Phone: [phone]   Fax:  [phone]  Physical Therapy Treatment  Patient Details  Name: Eddie Ferguson. Peets MRN: [id number] Date of Birth: 25-Oct-1940 Referring Provider (PT): Garret Reddish    Encounter Date: 10/22/2019   PT End of Session - 10/24/19 0924    Visit Number 3    Number of Visits 12    Date for PT Re-Evaluation 11/21/19    Authorization Type Aetna Medicare    PT Start Time 0932    PT Stop Time 1014    PT Time Calculation (min) 42 min    Equipment Utilized During Treatment Gait belt    Activity Tolerance Patient tolerated treatment well    Behavior During Therapy WFL for tasks assessed/performed           Past Medical History:  Diagnosis Date  . Allergy   . Ankle injury 06/01/2001   no surgery- skin debridement   . Arthritis   . Basal cell carcinoma 05/28/2014   L nasal tip. Mohs Dr. Link Snuffer.    . Blood transfusion    2010 because of stomach ulcers  . Cataract    bilaterally removed  . Diabetes mellitus   . GERD (gastroesophageal reflux disease)   . Glaucoma   . Hyperlipidemia   . Hypertension   . Hypothyroidism   . Pneumonia 04/12/2006  . Prostate cancer ([place])   . Seasonal allergies   . Sinusitis    treated for bacterial infection at least once a year at [facility]  . Stomach ulcer 2010   bleeding ulcer result NSAIDS    Past Surgical History:  Procedure Laterality Date  . abdominl abcess     03-15-2011  . BACK SURGERY    . CATARACT EXTRACTION, BILATERAL     04-07-2014, 03-17-2014  . COLONOSCOPY     2012, 5 year repeat  . CYSTOURETHROSCOPY  11/11/1998  . ESOPHAGOGASTRODUODENOSCOPY  2010   PUD  . LUMBAR LAMINECTOMY  2007   [place]  . MOHS SURGERY     left side of nose  . POLYPECTOMY    . PROSTATE BIOPSY     radiation 2016  . RECONSTRUCTION TENDON PULLEY W/ TENDON / FASCIAL GRAFT OF HAND / FINGER  1982   rt 3rd finger  . RECONSTRUCTION  TENDON PULLEY W/ TENDON / FASCIAL GRAFT OF HAND / FINGER  1953   rt 5th finger  . UPPER GASTROINTESTINAL ENDOSCOPY      There were no vitals filed for this visit.   Subjective Assessment - 10/24/19 0924    Subjective Pt with no new complaints.    Currently in Pain? No/denies                             [facility] Adult PT Treatment/Exercise - 10/24/19 0001      Ambulation/Gait   Gait Comments no AD: 35 ft x 6 with close supervision ; Side stepping 20 ft x 4,       Exercises   Exercises Knee/Hip      Knee/Hip Exercises: Standing   Heel Raises 15 reps    Hip Flexion 20 reps;Knee bent    Hip Abduction 20 reps;Both    Forward Step Up 10 reps;Both;Hand Hold: 1;Step Height: 6"    Other Standing Knee Exercises L/R and A/P weight shifts x25 each;  Staggered stance weight shifts x 25 bil;  L/R reach with weight  shifts x 15:   fwd/bwd stepping with weight shifting x 20 bil;     Other Standing Knee Exercises Toe taps on 6 in step x 20;       Knee/Hip Exercises: Seated   Long Arc Quad 20 reps    Long Arc Quad Weight 2 lbs.    Sit to [employer] 10 reps      Knee/Hip Exercises: Supine   Quad Sets 20 reps;Left    Straight Leg Raises 15 reps;Both    Straight Leg Raises Limitations for knee pain/strength                    PT Short Term Goals - 10/17/19 0942      PT SHORT TERM GOAL #1   Title Pt to be independent with initial HEP    Time 2    Period Weeks    Status New    Target Date 10/24/19             PT Long Term Goals - 10/17/19 0942      PT LONG TERM GOAL #1   Title Pt to be independent with final HEP    Time 6    Period Weeks    Status New    Target Date 11/21/19      PT LONG TERM GOAL #2   Title Pt to demo safe, independent ability for navigating at least 5 steps, with 1 hand rail and LRAD.    Time 6    Period Weeks    Status New    Target Date 11/21/19      PT LONG TERM GOAL #3   Title Pt to have improved score on BERG by at least  5  points    Time 6    Period Weeks    Status New    Target Date 11/21/19      PT LONG TERM GOAL #4   Title Pt to show improved score on DGI by at least 4 points.    Time 6    Period Weeks    Status New    Target Date 11/21/19      PT LONG TERM GOAL #5   Title Pt to demo ability for safe ambulation with LRAD, for up to 500 ft, with dynamic balance WFL for pt age.    Time 6    Period Weeks    Status New    Target Date 11/21/19                 Plan - 10/24/19 0925    Clinical Impression Statement Ther ex performed for LE strength, added exercises for quad/knee strengthening. Pt more hesitant and less stable on L knee due to pain and weakness, noted on stairs today. Challenged with balance activities, but no LOB.    Personal Factors and Comorbidities Comorbidity 1    Comorbidities dementia, hard to remember safety cues    Examination-Activity Limitations Bathing;Locomotion Level;Bend;Carry;Squat;Stairs;Lift;Stand    Examination-Participation Restrictions Meal Prep;Cleaning;Community Activity;Shop;Yard Work    Stability/Clinical Decision Making Stable/Uncomplicated    Rehab Potential Good    PT Frequency 2x / week    PT Duration 6 weeks    PT Treatment/Interventions ADLs/Self Care Home Management;Cryotherapy;Electrical Stimulation;DME Instruction;Ultrasound;Traction;Moist Heat;Iontophoresis 4mg /ml Dexamethasone;Gait training;Stair training;Functional mobility training;Therapeutic activities;Therapeutic exercise;Balance training;Neuromuscular re-education;Manual techniques;Orthotic Fit/Training;Patient/family education;Passive range of motion;Dry needling;Energy conservation;Joint Manipulations;Spinal Manipulations;Taping    Consulted and Agree with Plan of Care Patient  Patient will benefit from skilled therapeutic intervention in order to improve the following deficits and impairments:  Abnormal gait, Difficulty walking, Decreased safety awareness, Decreased activity  tolerance, Pain, Impaired flexibility, Decreased balance, Decreased mobility, Decreased strength  Visit Diagnosis: Other abnormalities of gait and mobility     Problem List Patient Active Problem List   Diagnosis Date Noted  . Aortic atherosclerosis ([place]) 01/05/2018  . Mild dementia ([place]) 09/26/2017  . CKD (chronic kidney disease), stage III 04/18/2017  . Gout 07/09/2015  . Mild cognitive impairment 06/16/2015  . Adenomatous colon polyp 05/28/2014  . History of prostate cancer-follows with Dr. Alinda Money of [facility] 05/28/2014  . Osteoarthritis 05/28/2014  . Erectile dysfunction 05/28/2014  . Basal cell carcinoma 05/28/2014  . Essential hypertension 04/23/2014  . Hyperlipidemia 04/23/2014  . GERD (gastroesophageal reflux disease) 04/23/2014  . Type 2 diabetes mellitus with diabetic chronic kidney disease ([place]) 04/23/2014  . Hypothyroidism 04/23/2014  . Allergic rhinitis 04/23/2014  . Glaucoma 04/23/2014  . History of nonmelanoma skin cancer 11/18/2012  . History of shingles 04/24/2011    Lyndee Hensen, PT, DPT 9:27 AM  10/24/19    [facility] [address] [place], [place], [address] Phone: [phone]   Fax:  [phone]  Name: Elby Blackwelder. Graffam MRN: [id number] Date of Birth: 1940-11-07

## 2019-10-24 NOTE — Progress Notes (Signed)
Patient: Eddie Ferguson. Ponder MRN: [id number] DOB: 01-08-1941 PCP: Marin Olp, MD     Subjective:  Chief Complaint  Patient presents with  . Cyst    On back    HPI: The patient is a 79 y.o. male who presents today for cyst on the back. It has been very sensitive the last couple of days. Pt's wife says that they are going out of town and did not want it to burst. He has had this for many years and has had it drained a couple of times. In the last few days it has become "very ugly" and looks like it could burst any minute. If he leans back it is tender and this is new. No fever/chills. It is red with a big white cap on it.   Review of Systems  Constitutional: Negative for chills and fever.  Skin: Positive for wound.  Neurological: Negative for dizziness and headaches.    Allergies Patient is allergic to aspirin, nsaids, amoxicillin-pot clavulanate, and sulfa antibiotics.  Past Medical History Patient  has a past medical history of Allergy, Ankle injury (06/01/2001), Arthritis, Basal cell carcinoma (05/28/2014), Blood transfusion, Cataract, Diabetes mellitus, GERD (gastroesophageal reflux disease), Glaucoma, Hyperlipidemia, Hypertension, Hypothyroidism, Pneumonia (04/12/2006), Prostate cancer ([place]), Seasonal allergies, Sinusitis, and Stomach ulcer (2010).  Surgical History Patient  has a past surgical history that includes Lumbar laminectomy (2007); Reconstruction tendon pulley w/ tendon / fascial graft of hand / finger (1982); Reconstruction tendon pulley w/ tendon / fascial graft of hand / finger (1953); Colonoscopy; Esophagogastroduodenoscopy (2010); Back surgery; Polypectomy; Cataract extraction, bilateral; Upper gastrointestinal endoscopy; Prostate biopsy; Mohs surgery; abdominl abcess; and Cystourethroscopy (11/11/1998).  Family History Pateint's family history includes Brain cancer in his brother; Colon polyps in his father; Dementia in his paternal grandmother; Diabetes in his  father and mother; Hyperlipidemia in his father.  Social History Patient  reports that he quit smoking about 18 years ago. He has never used smokeless tobacco. He reports current alcohol use. He reports that he does not use drugs.    Objective: Vitals:   10/24/19 1131  BP: (!) 146/76  Pulse: 64  Temp: 98 F (36.7 C)  TempSrc: Temporal  SpO2: 96%  Weight: 206 lb 9.6 oz (93.7 kg)  Height: 5\' 9"  (1.753 m)    Body mass index is 30.51 kg/m.  Physical Exam Vitals reviewed.  Constitutional:      Appearance: Normal appearance. He is obese.  HENT:     Head: Normocephalic and atraumatic.  Pulmonary:     Effort: Pulmonary effort is normal.  Skin:    Comments: Raised, erythematous inclusion cyst on mid back around t4. Tight, with no surrounding erythema.   Neurological:     Mental Status: He is alert.      Procedure note Incision and Drainage Procedure Note  Pre-operative Diagnosis: infected inclusion cyst  Post-operative Diagnosis: same  Indications: infection, drainage, pain   Anesthesia: 1-4 cc of 1%lidocaine with epi  Procedure Details  The procedure, risks and complications have been discussed in detail (including, but not limited to airway compromise, infection, bleeding) with the patient, and the patient has signed consent to the procedure.  The skin was sterilely prepped and draped over the affected area in the usual fashion. After adequate local anesthesia, I&D with a #11 blade was performed on the midline back, around T4. Purulent/thick cottage cheese drainage: present The patient was observed until stable.  Findings: Expected keratin debris.   EBL: 1 cc's  Tolerated well.  Assessment/plan: 1. Infected inclusion cyst I&D performed with copious amounts of material expressed. Course of keflex. Discussed optimal treatment would be excision by general surgery to get capsule out, but they will wait on this at this time. Fever/chills/spreading redness Er.  F/u as needed.    This visit occurred during the SARS-CoV-2 public health emergency.  Safety protocols were in place, including screening questions prior to the visit, additional usage of staff PPE, and extensive cleaning of exam room while observing appropriate contact time as indicated for disinfecting solutions.     Return if symptoms worsen or fail to improve.   Orma Flaming, MD [facility]   10/24/2019

## 2019-10-28 ENCOUNTER — Encounter: Payer: Medicare HMO | Admitting: Physical Therapy

## 2019-10-29 ENCOUNTER — Telehealth: Payer: Self-pay | Admitting: Family Medicine

## 2019-10-29 NOTE — Progress Notes (Signed)
  Chronic Care Management   Outreach Note  10/29/2019 Name: Eddie Ferguson. Schellhorn MRN: [id number] DOB: [birth year]  Referred by: Marin Olp, MD Reason for referral : No chief complaint on file.   An unsuccessful telephone outreach was attempted today. The patient was referred to the pharmacist for assistance with care management and care coordination.   Follow Up Plan:   [name] Upstream Scheduler

## 2019-10-30 ENCOUNTER — Ambulatory Visit: Payer: Medicare HMO | Admitting: Physical Therapy

## 2019-10-30 ENCOUNTER — Other Ambulatory Visit: Payer: Self-pay

## 2019-10-30 ENCOUNTER — Encounter: Payer: Self-pay | Admitting: Physical Therapy

## 2019-10-30 DIAGNOSIS — R2689 Other abnormalities of gait and mobility: Secondary | ICD-10-CM

## 2019-10-30 NOTE — Therapy (Signed)
[facility] [address] [place], [place], [address] Phone: [phone]   Fax:  [phone]  Physical Therapy Treatment  Patient Details  Name: Eddie Ferguson MRN: [id number] Date of Birth: Jun 06, 1940 Referring Provider (PT): Garret Reddish    Encounter Date: 10/30/2019   PT End of Session - 10/30/19 1207    Visit Number 5    Number of Visits 12    Date for PT Re-Evaluation 11/21/19    Authorization Type A[employer]na Medicare    PT Start Time [phone]    PT Stop Time 1015    PT Time Calculation (min) 39 min    Equipment Utilized During Treatment Gait belt    Activity Tolerance Patient tolerated treatment well    Behavior During Therapy [facility] for tasks assessed/performed           Past Medical History:  Diagnosis Date   Allergy    Ankle injury 06/01/2001   no surgery- skin debridement    Arthritis    Basal cell carcinoma 05/28/2014   L nasal tip. Mohs Dr. Link Snuffer.     Blood transfusion    2010 because of stomach ulcers   Cataract    bilaterally removed   Diabetes mellitus    GERD (gastroesophageal reflux disease)    Glaucoma    Hyperlipidemia    Hypertension    Hypothyroidism    Pneumonia 04/12/2006   Prostate cancer ([place])    Seasonal allergies    Sinusitis    treated for bacterial infection at least once a year at [facility]   Stomach ulcer 2010   bleeding ulcer result NSAIDS    Past Surgical History:  Procedure Laterality Date   abdominl abcess     03-15-2011   BACK SURGERY     CATARACT EXTRACTION, BILATERAL     04-07-2014, 03-17-2014   COLONOSCOPY     2012, 5 year repeat   CYSTOURETHROSCOPY  11/11/1998   ESOPHAGOGASTRODUODENOSCOPY  2010   PUD   LUM[place]OMY  2007   [place]   MOHS SURGERY     left side of nose   POLYPECTOMY     PROSTATE BIOPSY     radiation 2016   RECONSTRUCTION TENDON PULLEY W/ TENDON / FASCIAL GRAFT OF HAND / FINGER  1982   rt 3rd finger   RECONSTRUCTION  TENDON PULLEY W/ TENDON / FASCIAL GRAFT OF HAND / FINGER  1953   rt 5th finger   UPPER GASTROINTESTINAL ENDOSCOPY      There were no vitals filed for this visit.   Subjective Assessment - 10/30/19 1204    Subjective Pt with no new complaints.    Currently in Pain? No/denies    Pain Score 0-No pain                             [facility] Adult PT Treatment/Exercise - 10/30/19 0001      Ambulation/Gait   Gait Comments no AD: 35 ft x 8       Knee/Hip Exercises: Standing   Stairs up/down 5 steps with 2 hand hold, 6 in, x 6;     Other Standing Knee Exercises L/R and A/P weight shifts x25 each;  L/R weight shift/wide, with reaching x 15; Staggered stance weight shifts x 25 bil;  L;  Side stepping 15 ft x 4;     Other Standing Knee Exercises Toe taps on 6 in step x 20 with light UE suport;  x10 with no UE support        Knee/Hip Exercises: Seated   Long Arc Quad 20 reps    Long Arc Quad Weight 3 lbs.    Sit to [employer] 10 reps      Knee/Hip Exercises: Supine   Bridges 20 reps    Straight Leg Raises 15 reps;Both                    PT Short Term Goals - 10/17/19 0942      PT SHORT TERM GOAL #1   Title Pt to be independent with initial HEP    Time 2    Period Weeks    Status New    Target Date 10/24/19             PT Long Term Goals - 10/17/19 0942      PT LONG TERM GOAL #1   Title Pt to be independent with final HEP    Time 6    Period Weeks    Status New    Target Date 11/21/19      PT LONG TERM GOAL #2   Title Pt to demo safe, independent ability for navigating at least 5 steps, with 1 hand rail and LRAD.    Time 6    Period Weeks    Status New    Target Date 11/21/19      PT LONG TERM GOAL #3   Title Pt to have improved score on BERG by at least  5 points    Time 6    Period Weeks    Status New    Target Date 11/21/19      PT LONG TERM GOAL #4   Title Pt to show improved score on DGI by at least 4 points.    Time 6    Period Weeks     Status New    Target Date 11/21/19      PT LONG TERM GOAL #5   Title Pt to demo ability for safe ambulation with LRAD, for up to 500 ft, with dynamic balance WFL for pt age.    Time 6    Period Weeks    Status New    Target Date 11/21/19                 Plan - 10/30/19 1208    Clinical Impression Statement Pt progressing well. He is hesitant for activitiy with L knee bc he thinks it is weak, but is doing well with strengthening activities. Pt improving with gait without AD, and with stairs.    Personal Factors and Comorbidities Comorbidity 1    Comorbidities dementia, hard to remember safety cues    Examination-Activity Limitations Bathing;Locomotion Level;Bend;Carry;Squat;Stairs;Lift;Stand    Examination-Participation Restrictions Meal Prep;Cleaning;Community Activity;Shop;Yard Work    Stability/Clinical Decision Making Stable/Uncomplicated    Rehab Potential Good    PT Frequency 2x / week    PT Duration 6 weeks    PT Treatment/Interventions ADLs/Self Care Home Management;Cryotherapy;Electrical Stimulation;DME Instruction;Ultrasound;Traction;Moist Heat;Iontophoresis 4mg /ml Dexamethasone;Gait training;Stair training;Functional mobility training;Therapeutic activities;Therapeutic exercise;Balance training;Neuromuscular re-education;Manual techniques;Orthotic Fit/Training;Patient/family education;Passive range of motion;Dry needling;Energy conservation;Joint Manipulations;Spinal Manipulations;Taping    Consulted and Agree with Plan of Care Patient           Patient will benefit from skilled therapeutic intervention in order to improve the following deficits and impairments:  Abnormal gait, Difficulty walking, Decreased safety awareness, Decreased activity tolerance, Pain, Impaired flexibility, Decreased balance, Decreased mobility, Decreased strength  Visit Diagnosis: Other abnormalities of gait and mobility     Problem List Patient Active Problem List   Diagnosis Date  Noted   Aortic atherosclerosis ([place]) 01/05/2018   Mild dementia ([place]) 09/26/2017   CKD (chronic kidney disease), stage III 04/18/2017   Gout 07/09/2015   Mild cognitive impairment 06/16/2015   Adenomatous colon polyp 05/28/2014   History of prostate cancer-follows with [facility] Al[facility] 05/28/2014   Osteoarthritis 05/28/2014   Erectile dysfunction 05/28/2014   Basal cell carcinoma 05/28/2014   Essential hypertension 04/23/2014   Hyperlipidemia 04/23/2014   GERD (gastroesophageal reflux disease) 04/23/2014   Type 2 diabetes mellitus with diabetic chronic kidney disease ([place]) 04/23/2014   Hypothyroidism 04/23/2014   Allergic rhinitis 04/23/2014   Glaucoma 04/23/2014   History of nonmelanoma skin cancer 11/18/2012   History of shingles 04/24/2011    Lyndee Hensen, PT, DPT 12:09 PM  10/30/19    [facility] [address] [place], [place], [address] Phone: [phone]   Fax:  [phone]  Name: Eddie Ferguson MRN: [id number] Date of Birth: 04-14-1940

## 2019-11-05 ENCOUNTER — Encounter: Payer: Self-pay | Admitting: Physical Therapy

## 2019-11-05 ENCOUNTER — Other Ambulatory Visit: Payer: Self-pay

## 2019-11-05 ENCOUNTER — Ambulatory Visit (INDEPENDENT_AMBULATORY_CARE_PROVIDER_SITE_OTHER): Payer: Medicare HMO | Admitting: Physical Therapy

## 2019-11-05 DIAGNOSIS — R2689 Other abnormalities of gait and mobility: Secondary | ICD-10-CM

## 2019-11-05 NOTE — Therapy (Signed)
[facility] [address] [place], [place], [address] Phone: [phone]   Fax:  [phone]  Physical Therapy Treatment  Patient Details  Name: Eddie Ferguson MRN: [id number] Date of Birth: 30-Mar-1940 Referring Provider (PT): Garret Reddish    Encounter Date: 11/05/2019   PT End of Session - 11/05/19 1027    Visit Number 6    Number of Visits 12    Date for PT Re-Evaluation 11/21/19    Authorization Type Aetna Medicare    PT Start Time 0930    PT Stop Time 1014    PT Time Calculation (min) 44 min    Equipment Utilized During Treatment Gait belt    Activity Tolerance Patient tolerated treatment well    Behavior During Therapy WFL for tasks assessed/performed           Past Medical History:  Diagnosis Date   Allergy    Ankle injury 06/01/2001   no surgery- skin debridement    Arthritis    Basal cell carcinoma 05/28/2014   L nasal tip. Mohs Dr. Link Snuffer.     Blood transfusion    2010 because of stomach ulcers   Cataract    bilaterally removed   Diabetes mellitus    GERD (gastroesophageal reflux disease)    Glaucoma    Hyperlipidemia    Hypertension    Hypothyroidism    Pneumonia 04/12/2006   Prostate cancer ([place])    Seasonal allergies    Sinusitis    treated for bacterial infection at least once a year at [facility]   Stomach ulcer 2010   bleeding ulcer result NSAIDS    Past Surgical History:  Procedure Laterality Date   abdominl abcess     03-15-2011   BACK SURGERY     CATARACT EXTRACTION, BILATERAL     04-07-2014, 03-17-2014   COLONOSCOPY     2012, 5 year repeat   CYSTOURETHROSCOPY  11/11/1998   ESOPHAGOGASTRODUODENOSCOPY  2010   PUD   LUMBAR LAMINECTOMY  2007   [place]   MOHS SURGERY     left side of nose   POLYPECTOMY     PROSTATE BIOPSY     radiation 2016   RECONSTRUCTION TENDON PULLEY W/ TENDON / FASCIAL GRAFT OF HAND / FINGER  1982   rt 3rd finger   RECONSTRUCTION  TENDON PULLEY W/ TENDON / FASCIAL GRAFT OF HAND / FINGER  1953   rt 5th finger   UPPER GASTROINTESTINAL ENDOSCOPY      There were no vitals filed for this visit.   Subjective Assessment - 11/05/19 1027    Subjective Pt reports doing well at home.    Currently in Pain? No/denies    Pain Score 0-No pain                             [facility] Adult PT Treatment/Exercise - 11/05/19 0943      Ambulation/Gait   Gait Comments no AD:  420 ft       Knee/Hip Exercises: Aerobic   Recumbent Bike L1 x 6 min;       Knee/Hip Exercises: Standing   Heel Raises 20 reps    Hip Flexion 20 reps;Knee bent    Hip Abduction 20 reps;Both    Stairs up/down 5 steps with 1 hand hold, 6 in, x 6; practice for recipricol pattern and education for one at a time if needed.     Other Standing Knee  Exercises L/R weight shifts x25 each; Side stepping 15 ft x 4;     Other Standing Knee Exercises Toe taps on 6 in step x 20 with light UE suport; x10 with no UE support        Knee/Hip Exercises: Seated   Long Arc Quad 20 reps    Long Arc Quad Weight 3 lbs.    Sit to [employer] 10 reps      Knee/Hip Exercises: Supine   Bridges --    Straight Leg Raises --                    PT Short Term Goals - 10/17/19 0942      PT SHORT TERM GOAL #1   Title Pt to be independent with initial HEP    Time 2    Period Weeks    Status New    Target Date 10/24/19             PT Long Term Goals - 10/17/19 0942      PT LONG TERM GOAL #1   Title Pt to be independent with final HEP    Time 6    Period Weeks    Status New    Target Date 11/21/19      PT LONG TERM GOAL #2   Title Pt to demo safe, independent ability for navigating at least 5 steps, with 1 hand rail and LRAD.    Time 6    Period Weeks    Status New    Target Date 11/21/19      PT LONG TERM GOAL #3   Title Pt to have improved score on BERG by at least  5 points    Time 6    Period Weeks    Status New    Target Date 11/21/19       PT LONG TERM GOAL #4   Title Pt to show improved score on DGI by at least 4 points.    Time 6    Period Weeks    Status New    Target Date 11/21/19      PT LONG TERM GOAL #5   Title Pt to demo ability for safe ambulation with LRAD, for up to 500 ft, with dynamic balance WFL for pt age.    Time 6    Period Weeks    Status New    Target Date 11/21/19                 Plan - 11/05/19 1029    Clinical Impression Statement Pt progressing well. Improving with ability for ambulation without AD, stairs, and LE strength. Likely d/c next week.    Personal Factors and Comorbidities Comorbidity 1    Comorbidities dementia, hard to remember safety cues    Examination-Activity Limitations Bathing;Locomotion Level;Bend;Carry;Squat;Stairs;Lift;Stand    Examination-Participation Restrictions Meal Prep;Cleaning;Community Activity;Shop;Yard Work    Stability/Clinical Decision Making Stable/Uncomplicated    Rehab Potential Good    PT Frequency 2x / week    PT Duration 6 weeks    PT Treatment/Interventions ADLs/Self Care Home Management;Cryotherapy;Electrical Stimulation;DME Instruction;Ultrasound;Traction;Moist Heat;Iontophoresis 4mg /ml Dexamethasone;Gait training;Stair training;Functional mobility training;Therapeutic activities;Therapeutic exercise;Balance training;Neuromuscular re-education;Manual techniques;Orthotic Fit/Training;Patient/family education;Passive range of motion;Dry needling;Energy conservation;Joint Manipulations;Spinal Manipulations;Taping    Consulted and Agree with Plan of Care Patient           Patient will benefit from skilled therapeutic intervention in order to improve the following deficits and impairments:  Abnormal gait, Difficulty walking, Decreased  safety awareness, Decreased activity tolerance, Pain, Impaired flexibility, Decreased balance, Decreased mobility, Decreased strength  Visit Diagnosis: Other abnormalities of gait and mobility     Problem  List Patient Active Problem List   Diagnosis Date Noted   Aortic atherosclerosis ([place]) 01/05/2018   Mild dementia ([place]) 09/26/2017   CKD (chronic kidney disease), stage III 04/18/2017   Gout 07/09/2015   Mild cognitive impairment 06/16/2015   Adenomatous colon polyp 05/28/2014   History of prostate cancer-follows with [facility] of [facility] 05/28/2014   Osteoarthritis 05/28/2014   Erectile dysfunction 05/28/2014   Basal cell carcinoma 05/28/2014   Essential hypertension 04/23/2014   Hyperlipidemia 04/23/2014   GERD (gastroesophageal reflux disease) 04/23/2014   Type 2 diabetes mellitus with diabetic chronic kidney disease ([place]) 04/23/2014   Hypothyroidism 04/23/2014   Allergic rhinitis 04/23/2014   Glaucoma 04/23/2014   History of nonmelanoma skin cancer 11/18/2012   History of shingles 04/24/2011    Lyndee Hensen, PT, DPT 11:58 AM  11/05/19    [facility] [address] [place], [place], [address] Phone: [phone]   Fax:  [phone]  Name: Eddie Ferguson MRN: [id number] Date of Birth: 1940-06-18

## 2019-11-06 ENCOUNTER — Telehealth: Payer: Self-pay | Admitting: Family Medicine

## 2019-11-06 NOTE — Progress Notes (Signed)
  Chronic Care Management   Outreach Note  11/06/2019 Name: Eddie Ferguson. [name] MRN: [id number] DOB: [birth year]  Referred by: Marin Olp, MD Reason for referral : No chief complaint on file.   An unsuccessful telephone outreach was attempted today. The patient was referred to the pharmacist for assistance with care management and care coordination.   Follow Up Plan:   [name] Upstream Scheduler

## 2019-11-07 ENCOUNTER — Ambulatory Visit: Payer: Medicare HMO | Admitting: Physical Therapy

## 2019-11-07 ENCOUNTER — Encounter: Payer: Self-pay | Admitting: Physical Therapy

## 2019-11-07 ENCOUNTER — Other Ambulatory Visit: Payer: Self-pay

## 2019-11-07 ENCOUNTER — Encounter: Payer: Medicare HMO | Admitting: Physical Therapy

## 2019-11-07 DIAGNOSIS — R2689 Other abnormalities of gait and mobility: Secondary | ICD-10-CM | POA: Diagnosis not present

## 2019-11-07 NOTE — Therapy (Signed)
[facility] [address] [place], [place], [address] Phone: [phone]   Fax:  [phone]  Physical Therapy Treatment  Patient Details  Name: Eddie Ferguson. Everage MRN: [id number] Date of Birth: [birth year] Referring Provider (PT): Garret Reddish    Encounter Date: 11/07/2019   PT End of Session - 11/07/19 1015    Visit Number 7    Number of Visits 12    Date for PT Re-Evaluation 11/21/19    Authorization Type Aetna Medicare    PT Start Time 0932    PT Stop Time 1013    PT Time Calculation (min) 41 min    Equipment Utilized During Treatment Gait belt    Activity Tolerance Patient tolerated treatment well    Behavior During Therapy [facility] for tasks assessed/performed           Past Medical History:  Diagnosis Date   Allergy    Ankle injury 06/01/2001   no surgery- skin debridement    Arthritis    Basal cell carcinoma 05/28/2014   L nasal tip. Mohs Dr. Link Snuffer.     Blood transfusion    2010 because of stomach ulcers   Cataract    bilaterally removed   Diabetes mellitus    GERD (gastroesophageal reflux disease)    Glaucoma    Hyperlipidemia    Hypertension    Hypothyroidism    Pneumonia 04/12/2006   Prostate cancer ([place])    Seasonal allergies    Sinusitis    treated for bacterial infection at least once a year at [facility]   Stomach ulcer 2010   bleeding ulcer result NSAIDS    Past Surgical History:  Procedure Laterality Date   abdominl abcess     03-15-2011   BACK SURGERY     CATARACT EXTRACTION, BILATERAL     04-07-2014, 03-17-2014   COLONOSCOPY     2012, 5 year repeat   CYSTOURETHROSCOPY  11/11/1998   ESOPHAGOGASTRODUODENOSCOPY  2010   PUD   LUMBAR LAMINECTOMY  2007   [place]   MOHS SURGERY     left side of nose   POLYPECTOMY     PROSTATE BIOPSY     radiation 2016   RECONSTRUCTION TENDON PULLEY W/ TENDON / FASCIAL GRAFT OF HAND / FINGER  1982   rt 3rd finger   RECONSTRUCTION  TENDON PULLEY W/ TENDON / FASCIAL GRAFT OF HAND / FINGER  1953   rt 5th finger   UPPER GASTROINTESTINAL ENDOSCOPY      There were no vitals filed for this visit.   Subjective Assessment - 11/07/19 1015    Subjective Reports doing HEP    Currently in Pain? No/denies              [facility] PT Assessment - 11/07/19 0001      Berg Balance Test   Sit to Stand Able to stand without using hands and stabilize independently    Standing Unsupported Able to stand safely 2 minutes    Sitting with Back Unsupported but Feet Supported on Floor or Stool Able to sit safely and securely 2 minutes    Stand to Sit Sits safely with minimal use of hands    Transfers Able to transfer safely, minor use of hands    Standing Unsupported with Eyes Closed Able to stand 10 seconds safely    Standing Unsupported with Feet Together Able to place feet together independently and stand 1 minute safely    From Standing, Reach Forward with Outstretched Arm  Can reach confidently >25 cm (10")    From Standing Position, Pick up Object from [place] to pick up shoe safely and easily    From Standing Position, Turn to Look Behind Over each Shoulder Turn sideways only but maintains balance    Turn 360 Degrees Able to turn 360 degrees safely one side only in 4 seconds or less    Standing Unsupported, Alternately Place Feet on Step/Stool Able to stand independently and complete 8 steps >20 seconds    Standing Unsupported, One Foot in Front Able to take small step independently and hold 30 seconds    Standing on One Leg Tries to lift leg/unable to hold 3 seconds but remains standing independently    Total Score 47      Timed Up and Go Test   TUG Comments 15.1 sec,  no AD                         [facility] Adult PT Treatment/Exercise - 11/07/19 0001      Ambulation/Gait   Gait Comments no AD: 200 ft       Knee/Hip Exercises: Standing   Heel Raises 20 reps    Hip Flexion 20 reps;Knee bent    Hip Abduction  20 reps;Both    Stairs up/down 5 steps with 2 hand hold, 6 in, x 6; practice for recipricol pattern and education for one at a time if needed.     Other Standing Knee Exercises L/R weight shifts x25 each; Side stepping 15 ft x 4;     Other Standing Knee Exercises Toe taps on 6 in step x 20 with light UE suport;       Knee/Hip Exercises: Seated   Long Arc Quad 20 reps    Long Arc Quad Weight 3 lbs.    Sit to [employer] 5 reps      Knee/Hip Exercises: Supine   Quad Sets 20 reps;Left    Straight Leg Raises 15 reps;Both                    PT Short Term Goals - 10/17/19 0942      PT SHORT TERM GOAL #1   Title Pt to be independent with initial HEP    Time 2    Period Weeks    Status New    Target Date 10/24/19             PT Long Term Goals - 10/17/19 0942      PT LONG TERM GOAL #1   Title Pt to be independent with final HEP    Time 6    Period Weeks    Status New    Target Date 11/21/19      PT LONG TERM GOAL #2   Title Pt to demo safe, independent ability for navigating at least 5 steps, with 1 hand rail and LRAD.    Time 6    Period Weeks    Status New    Target Date 11/21/19      PT LONG TERM GOAL #3   Title Pt to have improved score on BERG by at least  5 points    Time 6    Period Weeks    Status New    Target Date 11/21/19      PT LONG TERM GOAL #4   Title Pt to show improved score on DGI by at least 4 points.    Time  6    Period Weeks    Status New    Target Date 11/21/19      PT LONG TERM GOAL #5   Title Pt to demo ability for safe ambulation with LRAD, for up to 500 ft, with dynamic balance WFL for pt age.    Time 6    Period Weeks    Status New    Target Date 11/21/19                 Plan - 11/07/19 1016    Clinical Impression Statement Pt progressing well. Improved scores on balance testing today. Likely d/c next visit. Plan to review final HEP    Personal Factors and Comorbidities Comorbidity 1    Comorbidities dementia, hard  to remember safety cues    Examination-Activity Limitations Bathing;Locomotion Level;Bend;Carry;Squat;Stairs;Lift;Stand    Examination-Participation Restrictions Meal Prep;Cleaning;Community Activity;Shop;Yard Work    Stability/Clinical Decision Making Stable/Uncomplicated    Rehab Potential Good    PT Frequency 2x / week    PT Duration 6 weeks    PT Treatment/Interventions ADLs/Self Care Home Management;Cryotherapy;Electrical Stimulation;DME Instruction;Ultrasound;Traction;Moist Heat;Iontophoresis 4mg /ml Dexamethasone;Gait training;Stair training;Functional mobility training;Therapeutic activities;Therapeutic exercise;Balance training;Neuromuscular re-education;Manual techniques;Orthotic Fit/Training;Patient/family education;Passive range of motion;Dry needling;Energy conservation;Joint Manipulations;Spinal Manipulations;Taping    Consulted and Agree with Plan of Care Patient           Patient will benefit from skilled therapeutic intervention in order to improve the following deficits and impairments:  Abnormal gait, Difficulty walking, Decreased safety awareness, Decreased activity tolerance, Pain, Impaired flexibility, Decreased balance, Decreased mobility, Decreased strength  Visit Diagnosis: Other abnormalities of gait and mobility     Problem List Patient Active Problem List   Diagnosis Date Noted   Aortic atherosclerosis ([place]) 01/05/2018   Mild dementia ([place]) 09/26/2017   CKD (chronic kidney disease), stage III 04/18/2017   Gout 07/09/2015   Mild cognitive impairment 06/16/2015   Adenomatous colon polyp 05/28/2014   History of prostate cancer-follows with [facility] 05/28/2014   Osteoarthritis 05/28/2014   Erectile dysfunction 05/28/2014   Basal cell carcinoma 05/28/2014   Essential hypertension 04/23/2014   Hyperlipidemia 04/23/2014   GERD (gastroesophageal reflux disease) 04/23/2014   Type 2 diabetes mellitus with diabetic chronic  kidney disease ([place]) 04/23/2014   Hypothyroidism 04/23/2014   Allergic rhinitis 04/23/2014   Glaucoma 04/23/2014   History of nonmelanoma skin cancer 11/18/2012   History of shingles 04/24/2011    Lyndee Hensen, PT, DPT 10:18 AM  11/07/19    [facility] [address] [place], [place], [address] Phone: [phone]   Fax:  [phone]  Name: Gadge Hermiz. Podgorski MRN: [id number] Date of Birth: [birth year]

## 2019-11-12 ENCOUNTER — Encounter: Payer: Self-pay | Admitting: Physical Therapy

## 2019-11-12 ENCOUNTER — Ambulatory Visit (INDEPENDENT_AMBULATORY_CARE_PROVIDER_SITE_OTHER): Payer: Medicare HMO | Admitting: Physical Therapy

## 2019-11-12 ENCOUNTER — Other Ambulatory Visit: Payer: Self-pay

## 2019-11-12 DIAGNOSIS — R2689 Other abnormalities of gait and mobility: Secondary | ICD-10-CM | POA: Diagnosis not present

## 2019-11-12 NOTE — Therapy (Signed)
[facility] [address] [place], [place], [address] Phone: [phone]   Fax:  [phone]  Physical Therapy Treatment  Patient Details  Name: Eddie Ferguson MRN: [id number] Date of Birth: Oct 21, 1940 Referring Provider (PT): Garret Reddish    Encounter Date: 11/12/2019   PT End of Session - 11/12/19 1103    Visit Number 8    Number of Visits 12    Date for PT Re-Evaluation 11/21/19    PT Start Time 8182    PT Stop Time 1101    PT Time Calculation (min) 38 min    Activity Tolerance Patient tolerated treatment well    Behavior During Therapy [facility] for tasks assessed/performed           Past Medical History:  Diagnosis Date  . Allergy   . Ankle injury 06/01/2001   no surgery- skin debridement   . Arthritis   . Basal cell carcinoma 05/28/2014   L nasal tip. Mohs Dr. Link Snuffer.    . Blood transfusion    2010 because of stomach ulcers  . Cataract    bilaterally removed  . Diabetes mellitus   . GERD (gastroesophageal reflux disease)   . Glaucoma   . Hyperlipidemia   . Hypertension   . Hypothyroidism   . Pneumonia 04/12/2006  . Prostate cancer ([place])   . Seasonal allergies   . Sinusitis    treated for bacterial infection at least once a year at [facility]  . Stomach ulcer 2010   bleeding ulcer result NSAIDS    Past Surgical History:  Procedure Laterality Date  . abdominl abcess     03-15-2011  . BACK SURGERY    . CATARACT EXTRACTION, BILATERAL     04-07-2014, 03-17-2014  . COLONOSCOPY     2012, 5 year repeat  . CYSTOURETHROSCOPY  11/11/1998  . ESOPHAGOGASTRODUODENOSCOPY  2010   PUD  . LUMBAR [place]CTOMY  2007   [place]  . MOHS SURGERY     left side of nose  . POLYPECTOMY    . PROSTATE BIOPSY     radiation 2016  . RECONSTRUCTION TENDON PULLEY W/ TENDON / FASCIAL GRAFT OF HAND / FINGER  1982   rt 3rd finger  . RECONSTRUCTION TENDON PULLEY W/ TENDON / FASCIAL GRAFT OF HAND / FINGER  1953   rt 5th finger  . UPPER  GASTROINTESTINAL ENDOSCOPY      There were no vitals filed for this visit.   Subjective Assessment - 11/12/19 1029    Subjective Pt with no new complaints. Has been doing HEP, but not as often as needed. Feels safe with mobility at home.    Currently in Pain? No/denies    Pain Score 0-No pain              [facility] PT Assessment - 11/12/19 0001      Dynamic Gait Index   Level Surface Normal    Change in Gait Speed Normal    Gait with Horizontal Head Turns Mild Impairment    Gait with Vertical Head Turns Mild Impairment    Gait and Pivot Turn Normal    Step Over Obstacle Mild Impairment    Step Around Obstacles Normal    Steps Mild Impairment    Total Score 20                         [facility] Adult PT Treatment/Exercise - 11/12/19 0001      Ambulation/Gait  Gait Comments --      Knee/Hip Exercises: Aerobic   Recumbent Bike L 1 x 8 min       Knee/Hip Exercises: Standing   Heel Raises 20 reps    Hip Flexion 20 reps;Knee bent    Hip Abduction 20 reps;Both    Stairs up/down 5 steps with 2 hand hold, 6 in, x 6; practice for recipricol pattern and education for one at a time if needed.     Other Standing Knee Exercises L/R weight shifts x25 each;     Other Standing Knee Exercises Toe taps on 6 in step x 20 with light UE suport;       Knee/Hip Exercises: Seated   Long Arc Quad 20 reps    Long Arc Quad Weight 3 lbs.    Sit to [employer] 10 reps      Knee/Hip Exercises: Supine   Quad Sets 20 reps;Left    Straight Leg Raises Both;20 reps    Other Supine Knee/Hip Exercises SKTC 30 sec x 2; bil;                   PT Education - 11/12/19 1031    Education Details Final HEP reviewed in detail    Person(s) Educated Patient    Methods Explanation;Demonstration;Verbal cues;Handout    Comprehension Verbalized understanding;Returned demonstration;Verbal cues required            PT Short Term Goals - 11/12/19 1032      PT SHORT TERM GOAL #1   Title Pt to be  independent with initial HEP    Time 2    Period Weeks    Status Achieved    Target Date 10/24/19             PT Long Term Goals - 11/12/19 1032      PT LONG TERM GOAL #1   Title Pt to be independent with final HEP    Time 6    Period Weeks    Status Achieved      PT LONG TERM GOAL #2   Title Pt to demo safe, independent ability for navigating at least 5 steps, with 1 hand rail and LRAD.    Baseline req use of 2 rails with recipricol pattern, difficulty with recipricol pattern due to L knee pain/apprehension.    Time 6    Period Weeks    Status Partially Met      PT LONG TERM GOAL #3   Title Pt to have improved score on BERG by at least  5 points    Time 6    Period Weeks    Status Achieved      PT LONG TERM GOAL #4   Title Pt to show improved score on DGI by at least 4 points.    Time 6    Period Weeks    Status Achieved      PT LONG TERM GOAL #5   Title Pt to demo ability for safe ambulation with LRAD, for up to 500 ft, with dynamic balance WFL for pt age.    Time 6    Period Weeks    Status Achieved                 Plan - 11/12/19 1103    Clinical Impression Statement Pt showing good improvements. Improved scores on balance tests and improved strength. Still req use of 2 hand hold on stairs due to L knee apprehension/weakness. All other goals  met a this time, ready for d/c to HEP. Final HEp reviewed in detail today.    Personal Factors and Comorbidities Comorbidity 1    Comorbidities dementia, hard to remember safety cues    Examination-Activity Limitations Bathing;Locomotion Level;Bend;Carry;Squat;Stairs;Lift;Stand    Examination-Participation Restrictions Meal Prep;Cleaning;Community Activity;Shop;Yard Work    Stability/Clinical Decision Making Stable/Uncomplicated    Rehab Potential Good    PT Frequency 2x / week    PT Duration 6 weeks    PT Treatment/Interventions ADLs/Self Care Home Management;Cryotherapy;Electrical Stimulation;DME  Instruction;Ultrasound;Traction;Moist Heat;Iontophoresis 4mg /ml Dexamethasone;Gait training;Stair training;Functional mobility training;Therapeutic activities;Therapeutic exercise;Balance training;Neuromuscular re-education;Manual techniques;Orthotic Fit/Training;Patient/family education;Passive range of motion;Dry needling;Energy conservation;Joint Manipulations;Spinal Manipulations;Taping    Consulted and Agree with Plan of Care Patient           Patient will benefit from skilled therapeutic intervention in order to improve the following deficits and impairments:  Abnormal gait, Difficulty walking, Decreased safety awareness, Decreased activity tolerance, Pain, Impaired flexibility, Decreased balance, Decreased mobility, Decreased strength  Visit Diagnosis: Other abnormalities of gait and mobility     Problem List Patient Active Problem List   Diagnosis Date Noted  . Aortic atherosclerosis ([place]) 01/05/2018  . Mild dementia ([place]) 09/26/2017  . CKD (chronic kidney disease), stage III 04/18/2017  . Gout 07/09/2015  . Mild cognitive impairment 06/16/2015  . Adenomatous colon polyp 05/28/2014  . History of prostate cancer-follows with Dr. Alinda Money of [facility] 05/28/2014  . Osteoarthritis 05/28/2014  . Erectile dysfunction 05/28/2014  . Basal cell carcinoma 05/28/2014  . Essential hypertension 04/23/2014  . Hyperlipidemia 04/23/2014  . GERD (gastroesophageal reflux disease) 04/23/2014  . Type 2 diabetes mellitus with diabetic chronic kidney disease ([place]) 04/23/2014  . Hypothyroidism 04/23/2014  . Allergic rhinitis 04/23/2014  . Glaucoma 04/23/2014  . History of nonmelanoma skin cancer 11/18/2012  . History of shingles 04/24/2011     Lyndee Hensen, PT, DPT 11:11 AM  11/12/19   [facility] [address] [place], [place], [address] Phone: [phone]   Fax:  [phone]  Name: Eddie Ferguson MRN: [id number] Date of  Birth: 03-25-1940    PHYSICAL THERAPY DISCHARGE SUMMARY  Visits from Start of Care: 8   Plan: Patient agrees to discharge.  Patient goals were met. Patient is being discharged due to meeting the stated rehab goals.  ?????     Lyndee Hensen, PT, DPT 11:11 AM  11/12/19

## 2019-11-12 NOTE — Patient Instructions (Signed)
Access Code: [email or web address] URL: [email or web address] Date: 11/12/2019 Prepared by: Lyndee Hensen  Exercises Seated Knee Extension AROM - 1 x daily - 2 sets - 10 reps Standing March with Counter Support - 1 x daily - 2 sets - 10 reps Standing Hip Abduction with Counter Support - 1 x daily - 2 sets - 10 reps Heel rises with counter support - 1 x daily - 2 sets - 10 reps Side Stepping with Counter Support - 1 x daily - 2 sets - 10 reps Standing Weight Shift - 1 x daily - 2 sets - 10 reps Sit to Stand - 1 x daily - 2 sets - 5 reps Straight Leg Raise - 1 x daily - 1-2 sets - 10 reps Supine Quadricep Sets - 2 x daily - 2 sets - 10 reps

## 2019-11-14 ENCOUNTER — Telehealth: Payer: Self-pay | Admitting: Family Medicine

## 2019-11-14 ENCOUNTER — Encounter: Payer: Medicare HMO | Admitting: Physical Therapy

## 2019-11-14 NOTE — Progress Notes (Signed)
  Chronic Care Management   Outreach Note  11/14/2019 Name: Eddie Ferguson. Eddie Ferguson MRN: [id number] DOB: 09-30-1940  Referred by: Marin Olp, MD Reason for referral : No chief complaint on file.   An unsuccessful telephone outreach was attempted today. The patient was referred to the pharmacist for assistance with care management and care coordination.   Follow Up Plan:   [name] [facility] Scheduler

## 2019-11-20 DIAGNOSIS — C61 Malignant neoplasm of prostate: Secondary | ICD-10-CM | POA: Diagnosis not present

## 2019-11-27 DIAGNOSIS — Z8546 Personal history of malignant neoplasm of prostate: Secondary | ICD-10-CM | POA: Diagnosis not present

## 2019-12-13 ENCOUNTER — Other Ambulatory Visit: Payer: Self-pay | Admitting: Family Medicine

## 2019-12-19 ENCOUNTER — Encounter: Payer: Self-pay | Admitting: Neurology

## 2019-12-26 ENCOUNTER — Other Ambulatory Visit: Payer: Self-pay | Admitting: Family Medicine

## 2020-01-07 DIAGNOSIS — D485 Neoplasm of uncertain behavior of skin: Secondary | ICD-10-CM | POA: Diagnosis not present

## 2020-01-07 DIAGNOSIS — D2262 Melanocytic nevi of left upper limb, including shoulder: Secondary | ICD-10-CM | POA: Diagnosis not present

## 2020-01-07 DIAGNOSIS — L570 Actinic keratosis: Secondary | ICD-10-CM | POA: Diagnosis not present

## 2020-01-07 DIAGNOSIS — D225 Melanocytic nevi of trunk: Secondary | ICD-10-CM | POA: Diagnosis not present

## 2020-01-07 DIAGNOSIS — Z85828 Personal history of other malignant neoplasm of skin: Secondary | ICD-10-CM | POA: Diagnosis not present

## 2020-01-07 DIAGNOSIS — L814 Other melanin hyperpigmentation: Secondary | ICD-10-CM | POA: Diagnosis not present

## 2020-01-07 DIAGNOSIS — L821 Other seborrheic keratosis: Secondary | ICD-10-CM | POA: Diagnosis not present

## 2020-01-07 DIAGNOSIS — D1801 Hemangioma of skin and subcutaneous tissue: Secondary | ICD-10-CM | POA: Diagnosis not present

## 2020-01-08 ENCOUNTER — Other Ambulatory Visit: Payer: Self-pay

## 2020-01-08 ENCOUNTER — Ambulatory Visit (INDEPENDENT_AMBULATORY_CARE_PROVIDER_SITE_OTHER): Payer: Medicare HMO | Admitting: Physician Assistant

## 2020-01-08 ENCOUNTER — Encounter: Payer: Self-pay | Admitting: Physician Assistant

## 2020-01-08 VITALS — BP 120/74 | HR 57 | Temp 97.7°F | Ht 69.0 in | Wt 210.0 lb

## 2020-01-08 DIAGNOSIS — H6123 Impacted cerumen, bilateral: Secondary | ICD-10-CM

## 2020-01-08 DIAGNOSIS — Z23 Encounter for immunization: Secondary | ICD-10-CM | POA: Diagnosis not present

## 2020-01-08 NOTE — Progress Notes (Signed)
Eddie Ferguson. Eddie Ferguson is a 79 y.o. male here for a new problem.  I acted as a Education administrator for [employer], PA-C Anselmo Pickler, LPN   History of Present Illness:   Chief Complaint  Patient presents with  . Hearing Loss   HPI   Hearing loss Pt c/o trouble hearing out of both ears -- started yesterday. Denies pain or drainage. Has required ear lavages in the past.   Past Medical History:  Diagnosis Date  . Allergy   . Ankle injury 06/01/2001   no surgery- skin debridement   . Arthritis   . Basal cell carcinoma 05/28/2014   L nasal tip. Mohs Dr. Link Snuffer.    . Blood transfusion    2010 because of stomach ulcers  . Cataract    bilaterally removed  . Diabetes mellitus   . GERD (gastroesophageal reflux disease)   . Glaucoma   . Hyperlipidemia   . Hypertension   . Hypothyroidism   . Pneumonia 04/12/2006  . Prostate cancer ([place])   . Seasonal allergies   . Sinusitis    treated for bacterial infection at least once a year at [facility]  . Stomach ulcer 2010   bleeding ulcer result NSAIDS     Social History   Tobacco Use  . Smoking status: Former Smoker    Quit date: 02/03/2001    Years since quitting: 18.9  . Smokeless tobacco: Never Used  . Tobacco comment: intermittent cigar use stopped  Vaping Use  . Vaping Use: Never used  Substance Use Topics  . Alcohol use: Yes    Alcohol/week: 0.0 standard drinks    Comment: occasional beer  . Drug use: No    Past Surgical History:  Procedure Laterality Date  . abdominl abcess     03-15-2011  . BACK SURGERY    . CATARACT EXTRACTION, BILATERAL     04-07-2014, 03-17-2014  . COLONOSCOPY     2012, 5 year repeat  . CYSTOURETHROSCOPY  11/11/1998  . ESOPHAGOGASTRODUODENOSCOPY  2010   PUD  . LUMBAR LAMINECTOMY  2007   [place]  . MOHS SURGERY     left side of nose  . POLYPECTOMY    . PROSTATE BIOPSY     radiation 2016  . RECONSTRUCTION TENDON PULLEY W/ TENDON / FASCIAL GRAFT OF HAND / FINGER  1982   rt 3rd finger  .  RECONSTRUCTION TENDON PULLEY W/ TENDON / FASCIAL GRAFT OF HAND / FINGER  1953   rt 5th finger  . UPPER GASTROINTESTINAL ENDOSCOPY      Family History  Problem Relation Age of Onset  . Diabetes Mother   . Diabetes Father   . Hyperlipidemia Father   . Colon polyps Father   . Brain cancer Brother        smoker  . Dementia Paternal Grandmother   . Colon cancer Neg Hx   . Esophageal cancer Neg Hx   . Stomach cancer Neg Hx   . Rectal cancer Neg Hx     Allergies  Allergen Reactions  . Aspirin Other (See Comments)    Stomach ulcers  . Nsaids Other (See Comments)    Stomach ulcers  . Amoxicillin-Pot Clavulanate Rash  . Sulfa Antibiotics Swelling and Rash    Current Medications:   Current Outpatient Medications:  .  acetaminophen (TYLENOL) 500 MG tablet, Take 500 mg by mouth every 6 (six) hours as needed. Reported on 06/16/2015, Disp: , Rfl:  .  allopurinol (ZYLOPRIM) 100 MG tablet, Take 1 tablet (100  mg total) by mouth 2 (two) times daily., Disp: 180 tablet, Rfl: 3 .  amLODipine (NORVASC) 5 MG tablet, TAKE 1 TABLET BY MOUTH EVERY DAY, Disp: 90 tablet, Rfl: 1 .  carvedilol (COREG) 25 MG tablet, TAKE 1/2 TABLETS BY MOUTH 2 TIMES DAILY WITH A MEAL., Disp: 90 tablet, Rfl: 3 .  chlorthalidone (HYGROTON) 25 MG tablet, TAKE 1/2-1 TABLET BY MOUTH DAILY., Disp: 90 tablet, Rfl: 1 .  Cholecalciferol (VITAMIN D) 2000 UNITS CAPS, Take by mouth daily.  , Disp: , Rfl:  .  donepezil (ARICEPT) 10 MG tablet, Take 1 tablet (10 mg total) by mouth daily., Disp: 90 tablet, Rfl: 3 .  famotidine (PEPCID) 20 MG tablet, Take 20 mg by mouth daily., Disp: , Rfl:  .  fexofenadine (ALLEGRA) 180 MG tablet, Take 180 mg by mouth., Disp: , Rfl:  .  fluticasone (FLONASE) 50 MCG/ACT nasal spray, Place 2 sprays into both nostrils daily., Disp: 48 g, Rfl: 3 .  levothyroxine (SYNTHROID) 50 MCG tablet, TAKE 1 TABLET BY MOUTH EVERY DAY, Disp: 90 tablet, Rfl: 3 .  memantine (NAMENDA) 10 MG tablet, TAKE 1 TABLET EVERY NIGHT  FOR 2 WEEKS, THEN INCREASE TO 1 TABLET TWICE A DAY AND CONTINUE, Disp: 180 tablet, Rfl: 1 .  Multiple Vitamin (MULTIVITAMIN) tablet, Take 1 tablet by mouth daily.  , Disp: , Rfl:  .  NON FORMULARY, CBD oil. 3 gtts qd prn, Disp: , Rfl:  .  potassium chloride SA (KLOR-CON M20) 20 MEQ tablet, Take 2 tablets (40 mEq total) by mouth 2 (two) times daily., Disp: 360 tablet, Rfl: 3 .  pravastatin (PRAVACHOL) 40 MG tablet, TAKE 1 TABLET BY MOUTH EVERY DAY, Disp: 90 tablet, Rfl: 3 .  quinapril (ACCUPRIL) 40 MG tablet, TAKE 1 TABLET BY MOUTH EVERY DAY, Disp: 90 tablet, Rfl: 3   Review of Systems:   ROS  Negative unless otherwise specified per HPI.  Vitals:   Vitals:   01/08/20 1308  BP: 120/74  Pulse: (!) 57  Temp: 97.7 F (36.5 C)  TempSrc: Temporal  SpO2: 97%  Weight: 210 lb (95.3 kg)  Height: 5\' 9"  (1.753 m)     Body mass index is 31.01 kg/m.  Physical Exam:   Physical Exam Vitals and nursing note reviewed.  Constitutional:      Appearance: He is well-developed.  HENT:     Head: Normocephalic.     Right Ear: Ear canal and external ear normal. There is impacted cerumen.     Left Ear: Ear canal and external ear normal. There is impacted cerumen.  Eyes:     Conjunctiva/sclera: Conjunctivae normal.     Pupils: Pupils are equal, round, and reactive to light.  Pulmonary:     Effort: Pulmonary effort is normal.  Musculoskeletal:        General: Normal range of motion.     Cervical back: Normal range of motion.  Skin:    General: Skin is warm and dry.  Neurological:     Mental Status: He is alert and oriented to person, place, and time.  Psychiatric:        Behavior: Behavior normal.        Thought Content: Thought content normal.        Judgment: Judgment normal.     Ceruminosis is noted.  Wax removal attempt made with hydrogen peroxide and warm water. Only a small amount of wax was removed with flushing and and manual debridement. Trace amount of blood noted on outer ear  canal of L ear, no excessive bleeding appreciated.  Assessment and Plan:   [place] was seen today for hearing loss.  Diagnoses and all orders for this visit:  Bilateral impacted cerumen   Unsuccessful removal of cerumen. Tolerated procedure well without issues. Will refer to ENT to help provide better relief of symptoms.  CMA or LPN served as scribe during this visit. History, Physical, and Plan performed by medical provider. The above documentation has been reviewed and is accurate and complete.  Inda Coke, PA-C

## 2020-01-08 NOTE — Patient Instructions (Signed)
It was great to see you!  Flu shot today!  We will refer you to the [facility] doctor to get your ears cleaned.  Take care,  Inda Coke PA-C

## 2020-01-16 ENCOUNTER — Ambulatory Visit: Payer: Medicare HMO | Admitting: Neurology

## 2020-01-21 ENCOUNTER — Ambulatory Visit: Payer: Medicare HMO | Admitting: Neurology

## 2020-01-23 ENCOUNTER — Other Ambulatory Visit: Payer: Self-pay | Admitting: Neurology

## 2020-02-20 ENCOUNTER — Other Ambulatory Visit: Payer: Self-pay

## 2020-02-20 ENCOUNTER — Ambulatory Visit (INDEPENDENT_AMBULATORY_CARE_PROVIDER_SITE_OTHER): Payer: Medicare HMO

## 2020-02-20 VITALS — BP 120/68 | HR 70 | Temp 97.6°F | Resp 20 | Wt 207.6 lb

## 2020-02-20 DIAGNOSIS — Z0000 Encounter for general adult medical examination without abnormal findings: Secondary | ICD-10-CM | POA: Diagnosis not present

## 2020-02-20 NOTE — Progress Notes (Addendum)
Subjective:   [name]. Rondinelli is a 79 y.o. male who presents for Medicare Annual/Subsequent preventive examination.  Review of Systems     Cardiac Risk Factors include: advanced age (>50men, >69 women);diabetes mellitus;obesity (BMI >30kg/m2);male gender;hypertension;dyslipidemia     Objective:    Today's Vitals   02/20/20 0933  BP: 120/68  Pulse: 70  Resp: 20  Temp: 97.6 F (36.4 C)  SpO2: 94%  Weight: 207 lb 9.6 oz (94.2 kg)   Body mass index is 30.66 kg/m.  Advanced Directives 02/20/2020 10/17/2019 10/15/2019 07/12/2019 01/15/2019 06/08/2017 04/15/2016  Does Patient Have a Medical Advance Directive? Yes No No Yes Yes No Yes  Type of Paramedic of [place];Living will - - [place];Living will Living will;Healthcare Power of Attorney - -  Does patient want to make changes to medical advance directive? - - - - No - Patient declined - -  Copy of Emily in Chart? No - copy requested - - - No - copy requested - -  Would patient like information on creating a medical advance directive? - No - Patient declined No - Patient declined - - - -    Current Medications (verified) Outpatient Encounter Medications as of 02/20/2020  Medication Sig  . acetaminophen (TYLENOL) 500 MG tablet Take 500 mg by mouth every 6 (six) hours as needed. Reported on 06/16/2015  . allopurinol (ZYLOPRIM) 100 MG tablet Take 1 tablet (100 mg total) by mouth 2 (two) times daily.  Marland Kitchen amLODipine (NORVASC) 5 MG tablet TAKE 1 TABLET BY MOUTH EVERY DAY  . carvedilol (COREG) 25 MG tablet TAKE 1/2 TABLETS BY MOUTH 2 TIMES DAILY WITH A MEAL.  . chlorthalidone (HYGROTON) 25 MG tablet TAKE 1/2-1 TABLET BY MOUTH DAILY.  Marland Kitchen Cholecalciferol (VITAMIN D) 2000 UNITS CAPS Take by mouth daily.  Marland Kitchen donepezil (ARICEPT) 10 MG tablet Take 1 tablet (10 mg total) by mouth daily.  . famotidine (PEPCID) 20 MG tablet Take 20 mg by mouth daily.  . fexofenadine (ALLEGRA) 180 MG  tablet Take 180 mg by mouth.  . fluticasone (FLONASE) 50 MCG/ACT nasal spray Place 2 sprays into both nostrils daily.  Marland Kitchen levothyroxine (SYNTHROID) 50 MCG tablet TAKE 1 TABLET BY MOUTH EVERY DAY  . memantine (NAMENDA) 10 MG tablet TAKE 1 TABLET EVERY NIGHT FOR 2 WEEKS, THEN INCREASE TO 1 TABLET TWICE A DAY AND CONTINUE  . Multiple Vitamin (MULTIVITAMIN) tablet Take 1 tablet by mouth daily.  . NON FORMULARY CBD oil. 3 gtts qd prn  . potassium chloride SA (KLOR-CON M20) 20 MEQ tablet Take 2 tablets (40 mEq total) by mouth 2 (two) times daily.  . pravastatin (PRAVACHOL) 40 MG tablet TAKE 1 TABLET BY MOUTH EVERY DAY  . quinapril (ACCUPRIL) 40 MG tablet TAKE 1 TABLET BY MOUTH EVERY DAY   No facility-administered encounter medications on file as of 02/20/2020.    Allergies (verified) Aspirin, Nsaids, Amoxicillin-pot clavulanate, and Sulfa antibiotics   History: Past Medical History:  Diagnosis Date  . Allergy   . Ankle injury 06/01/2001   no surgery- skin debridement   . Arthritis   . Basal cell carcinoma 05/28/2014   L nasal tip. Mohs Dr. Link Snuffer.    . Blood transfusion    2010 because of stomach ulcers  . Cataract    bilaterally removed  . Diabetes mellitus   . GERD (gastroesophageal reflux disease)   . Glaucoma   . Hyperlipidemia   . Hypertension   . Hypothyroidism   .  Pneumonia 04/12/2006  . Prostate cancer ([place])   . Seasonal allergies   . Sinusitis    treated for bacterial infection at least once a year at [facility]  . Stomach ulcer 2010   bleeding ulcer result NSAIDS   Past Surgical History:  Procedure Laterality Date  . abdominl abcess     03-15-2011  . BACK SURGERY    . CATARACT EXTRACTION, BILATERAL     04-07-2014, 03-17-2014  . COLONOSCOPY     2012, 5 year repeat  . CYSTOURETHROSCOPY  11/11/1998  . ESOPHAGOGASTRODUODENOSCOPY  2010   [facility]  . LUMBAR LAMINECTOMY  2007   [place]  . MOHS SURGERY     left side of nose  . POLYPECTOMY    . PROSTATE BIOPSY     radiation  2016  . RECONSTRUCTION TENDON PULLEY W/ TENDON / FASCIAL GRAFT OF HAND / FINGER  1982   rt 3rd finger  . RECONSTRUCTION TENDON PULLEY W/ TENDON / FASCIAL GRAFT OF HAND / FINGER  1953   rt 5th finger  . UPPER GASTROINTESTINAL ENDOSCOPY     Family History  Problem Relation Age of Onset  . Diabetes Mother   . Diabetes Father   . Hyperlipidemia Father   . Colon polyps Father   . Brain cancer Brother        smoker  . Dementia Paternal Grandmother   . Colon cancer Neg Hx   . Esophageal cancer Neg Hx   . Stomach cancer Neg Hx   . Rectal cancer Neg Hx    Social History   Socioeconomic History  . Marital status: Married    Spouse name: Not on file  . Number of children: 2  . Years of education: 4  . Highest education level: Not on file  Occupational History  . Occupation: retired  Tobacco Use  . Smoking status: Former Smoker    Quit date: 02/03/2001    Years since quitting: 19.0  . Smokeless tobacco: Never Used  . Tobacco comment: intermittent cigar use stopped  Vaping Use  . Vaping Use: Never used  Substance and Sexual Activity  . Alcohol use: Yes    Alcohol/week: 0.0 standard drinks    Comment: occasional beer  . Drug use: No  . Sexual activity: Not Currently    Partners: Female  Other Topics Concern  . Not on file  Social History Narrative   Married (wife patient outside practice). 2 children. 5 grandkids.       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       Hobbies: travel, enjoys going to shows, [employer] [facility] [employer] [place] women's basketball tournament. Doesn't watch tv.             Lives in 1 story condo on the 3rd floor   Bachelor's degree   Right handed   Social Determinants of Health   Financial Resource Strain: Low Risk   . Difficulty of Paying Living Expenses: Not hard at all  Food Insecurity: No Food Insecurity  . Worried About Charity fundraiser in the Last Year: Never true  . Ran Out of Food in the Last Year: Never true  Transportation Needs: No Transportation Needs   . Lack of Transportation (Medical): No  . Lack of Transportation (Non-Medical): No  Physical Activity: Inactive  . Days of Exercise per Week: 0 days  . Minutes of Exercise per Session: 0 min  Stress: No Stress Concern Present  . Feeling of Stress : Not at all  Social Connections: Socially Isolated  .  Frequency of Communication with Friends and Family: Never  . Frequency of Social Gatherings with Friends and Family: Twice a week  . Attends Religious Services: Never  . Active Member of Clubs or Organizations: No  . Attends Archivist Meetings: Never  . Marital Status: Married    Tobacco Counseling Counseling given: Not Answered Comment: intermittent cigar use stopped   Clinical Intake:  Pre-visit preparation completed: Yes  Pain : No/denies pain     BMI - recorded: 30.66 Nutritional Status: BMI > 30  Obese Nutritional Risks: None Diabetes: Yes CBG done?: No Did pt. bring in CBG monitor from home?: No  How often do you need to have someone help you when you read instructions, pamphlets, or other written materials from your doctor or pharmacy?: 1 - Never  Diabetic?Nutrition Risk Assessment:  Has the patient had any N/V/D within the last 2 months?  No  Does the patient have any non-healing wounds?  No  Has the patient had any unintentional weight loss or weight gain?  No   Diabetes:  Is the patient diabetic?  Yes  If diabetic, was a CBG obtained today?  Yes  Did the patient bring in their glucometer from home?  No  How often do you monitor your CBG's? N/A.   Financial Strains and Diabetes Management:  Are you having any financial strains with the device, your supplies or your medication? No .  Does the patient want to be seen by Chronic Care Management for management of their diabetes?  No  Would the patient like to be referred to a Nutritionist or for Diabetic Management?  No   Diabetic Exams:  Diabetic Eye Exam: Completed 04/30/19 Diabetic Foot  Exam: Completed 03/21/19   Interpreter Needed?: No  Information entered by :: Charlott Rakes, LPN   Activities of Daily Living In your present state of health, do you have any difficulty performing the following activities: 02/20/2020 03/21/2019  Hearing? N N  Vision? N N  Difficulty concentrating or making decisions? N Y  Comment - Pt says more remembering  Walking or climbing stairs? Y N  Comment uses a cane -  Dressing or bathing? N N  Doing errands, shopping? N Y  Conservation officer, nature and eating ? N -  Using the Toilet? N -  In the past six months, have you accidently leaked urine? N -  Do you have problems with loss of bowel control? N -  Managing your Medications? N -  Managing your Finances? N -  Housekeeping or managing your Housekeeping? N -  Some recent data might be hidden    Patient Care Team: Marin Olp, MD as PCP - General (Family Medicine) Cameron Sprang, MD as Consulting Physician (Neurology) Gardiner Barefoot, DPM as Consulting Physician (Podiatry) Barbaraann Cao, OD as Consulting Physician (Optometry) Dermatology, [facility] as Consulting Physician  Indicate any recent Medical Services you may have received from other than [facility] providers in the past year (date may be approximate).     Assessment:   This is a routine wellness examination for [place].  Hearing/Vision screen  Hearing Screening   125Hz  250Hz  500Hz  1000Hz  2000Hz  3000Hz  4000Hz  6000Hz  8000Hz   Right ear:           Left ear:           Comments: No hearing issues at this time   Vision Screening Comments: Pt follows Dr Teodoro Spray for annual eye exams  Dietary issues and exercise activities discussed: Current Exercise  Habits: The patient does not participate in regular exercise at present, Exercise limited by: orthopedic condition(s)  Goals    . Exercise 150 minutes per week (moderate activity)     [facility]  May try the silver sneaker class;     . Patient Stated      Keep moving and staying engaged in life May consider silver sneakers       Depression Screen PHQ 2/9 Scores 02/20/2020 03/21/2019 01/15/2019 04/27/2018 06/08/2017 04/18/2017 04/15/2016  PHQ - 2 Score 0 0 0 0 0 0 0  PHQ- 9 Score - 0 - - - - -    Fall Risk Fall Risk  02/20/2020 09/17/2019 07/12/2019 03/21/2019 01/15/2019  Falls in the past year? 0 0 0 0 0  Number falls in past yr: 0 0 0 - -  Injury with Fall? 0 0 0 - 0  Risk for fall due to : Impaired balance/gait;Impaired vision Impaired balance/gait - - Impaired mobility  Risk for fall due to: Comment - has cain - - -  Follow up Falls prevention discussed - - - Falls prevention discussed;Education provided;Falls evaluation completed    FALL RISK PREVENTION PERTAINING TO THE HOME:  Any stairs in or around the home? Yes  but don't use, has access to an elevator If so, are there any without handrails? No  Home free of loose throw rugs in walkways, pet beds, electrical cords, etc? Yes  Adequate lighting in your home to reduce risk of falls? Yes   ASSISTIVE DEVICES UTILIZED TO PREVENT FALLS:  Life alert? No  Use of a cane, walker or w/c? Yes  Grab bars in the bathroom? Yes  Shower chair or bench in shower? Yes  Elevated toilet seat or a handicapped toilet? No   TIMED UP AND GO:  Was the test performed? Yes .  Length of time to ambulate 10 feet: 15 sec.   Gait slow and steady with assistive device  Cognitive Function: MMSE - Mini Mental State Exam 06/08/2017 04/15/2016  Not completed: Refused -  Orientation to time - 5  Orientation to Place - 5  Registration - 3  Attention/ Calculation - 5  Recall - 1  Language- name 2 objects - 2  Language- repeat - 1  Language- follow 3 step command - 3  Language- read & follow direction - 1  Write a sentence - 1  Copy design - 1  Total score - 28   Montreal Cognitive Assessment  05/10/2018 09/26/2017 03/27/2017  Visuospatial/ Executive (0/5) 2 4 4   Naming (0/3) 3 2 3   Attention: Read list  of digits (0/2) 2 2 2   Attention: Read list of letters (0/1) 1 1 1   Attention: Serial 7 subtraction starting at 100 (0/3) 3 3 3   Language: Repeat phrase (0/2) 2 2 2   Language : Fluency (0/1) 1 1 1   Abstraction (0/2) 2 2 2   Delayed Recall (0/5) 0 0 0  Orientation (0/6) 6 6 6   Total 22 23 24    6CIT Screen 02/20/2020  What month? 0 points  Count back from 20 0 points  Months in reverse 4 points  Repeat phrase 10 points    Immunizations Immunization History  Administered Date(s) Administered  . Fluad Quad(high Dose 65+) 01/07/2019, 01/08/2020  . Hepatitis B 06/19/2007, 11/16/2007, 04/18/2008  . Hepatitis B, ped/adol 06/19/2007, 11/16/2007, 04/18/2008  . Influenza Whole 01/27/2012  . Influenza, High Dose Seasonal PF 12/21/2015, 12/20/2016, 12/01/2017  . Influenza,inj,Quad PF,6+ Mos 02/24/2015  .  Influenza-Unspecified 01/27/2012, 03/22/2013, 01/21/2014  . Meningococcal Polysaccharide 12/19/2008  . PFIZER SARS-COV-2 Vaccination 03/27/2019, 04/17/2019, 12/10/2019  . Pneumococcal Conjugate-13 08/25/2014  . Pneumococcal Polysaccharide-23 06/19/2007, 12/19/2008, 12/21/2015  . Td 04/23/2014  . Tdap 03/07/2001  . Zoster 08/12/2008    TDAP status: Up to date  Flu Vaccine status: Up to date  Pneumococcal vaccine status: Up to date  Covid-19 vaccine status: Completed vaccines  Qualifies for Shingles Vaccine? Yes   Zostavax completed Yes   Shingrix Completed?: No.    Education has been provided regarding the importance of this vaccine. Patient has been advised to call insurance company to determine out of pocket expense if they have not yet received this vaccine. Advised may also receive vaccine at local pharmacy or Health Dept. Verbalized acceptance and understanding.  Screening Tests Health Maintenance  Topic Date Due  . HEMOGLOBIN A1C  03/19/2020  . FOOT EXAM  03/20/2020  . OPHTHALMOLOGY EXAM  04/29/2020  . COVID-19 Vaccine (4 - Booster for Pfizer series) 06/09/2020  .  TETANUS/TDAP  04/23/2024  . INFLUENZA VACCINE  Completed  . PNA vac Low Risk Adult  Completed    Health Maintenance  There are no preventive care reminders to display for this patient.  Colorectal cancer screening: No longer required.    Additional Screening:  Vision Screening: Recommended annual ophthalmology exams for early detection of glaucoma and other disorders of the eye. Is the patient up to date with their annual eye exam?  Yes  Who is the provider or what is the name of the office in which the patient attends annual eye exams? Dr Teodoro Spray   Dental Screening: Recommended annual dental exams for proper oral hygiene  Community Resource Referral / Chronic Care Management: CRR required this visit?  No   CCM required this visit?  No      Plan:     I have personally reviewed and noted the following in the patient's chart:   . Medical and social history . Use of alcohol, tobacco or illicit drugs  . Current medications and supplements . Functional ability and status . Nutritional status . Physical activity . Advanced directives . List of other physicians . Hospitalizations, surgeries, and ER visits in previous 12 months . Vitals . Screenings to include cognitive, depression, and falls . Referrals and appointments  In addition, I have reviewed and discussed with patient certain preventive protocols, quality metrics, and best practice recommendations. A written personalized care plan for preventive services as well as general preventive health recommendations were provided to patient.     Willette Brace, LPN   [id number]   Nurse Notes: None

## 2020-02-20 NOTE — Patient Instructions (Addendum)
Eddie Ferguson , Thank you for taking time to come for your Medicare Wellness Visit. I appreciate your ongoing commitment to your health goals. Please review the following plan we discussed and let me know if I can assist you in the future.   Screening recommendations/referrals: Colonoscopy: Done 04/13/16 Recommended yearly ophthalmology/optometry visit for glaucoma screening and checkup Recommended yearly dental visit for hygiene and checkup  Vaccinations: Influenza vaccine: Done 01/08/20 Pneumococcal vaccine: Up to date Tdap vaccine: Up to date Shingles vaccine: Shingrix discussed. Please contact your pharmacy for coverage information.    Covid-19: Completed 1/20 & 2/10  Advanced directives: Please bring a copy of your health care power of attorney and living will to the office at your convenience.  Conditions/risks identified: None at this time  Next appointment: Follow up in one year for your annual wellness visit.   Preventive Care 40 Years and Older, Male Preventive care refers to lifestyle choices and visits with your health care provider that can promote health and wellness. What does preventive care include?  A yearly physical exam. This is also called an annual well check.  Dental exams once or twice a year.  Routine eye exams. Ask your health care provider how often you should have your eyes checked.  Personal lifestyle choices, including:  Daily care of your teeth and gums.  Regular physical activity.  Eating a healthy diet.  Avoiding tobacco and drug use.  Limiting alcohol use.  Practicing safe sex.  Taking low doses of aspirin every day.  Taking vitamin and mineral supplements as recommended by your health care provider. What happens during an annual well check? The services and screenings done by your health care provider during your annual well check will depend on your age, overall health, lifestyle risk factors, and family history of disease. Counseling   Your health care provider may ask you questions about your:  Alcohol use.  Tobacco use.  Drug use.  Emotional well-being.  Home and relationship well-being.  Sexual activity.  Eating habits.  History of falls.  Memory and ability to understand (cognition).  Work and work Statistician. Screening  You may have the following tests or measurements:  Height, weight, and BMI.  Blood pressure.  Lipid and cholesterol levels. These may be checked every 5 years, or more frequently if you are over 49 years old.  Skin check.  Lung cancer screening. You may have this screening every year starting at age 54 if you have a 30-pack-year history of smoking and currently smoke or have quit within the past 15 years.  Fecal occult blood test (FOBT) of the stool. You may have this test every year starting at age 52.  Flexible sigmoidoscopy or colonoscopy. You may have a sigmoidoscopy every 5 years or a colonoscopy every 10 years starting at age 43.  Prostate cancer screening. Recommendations will vary depending on your family history and other risks.  Hepatitis C blood test.  Hepatitis B blood test.  Sexually transmitted disease (STD) testing.  Diabetes screening. This is done by checking your blood sugar (glucose) after you have not eaten for a while (fasting). You may have this done every 1-3 years.  Abdominal aortic aneurysm (AAA) screening. You may need this if you are a current or former smoker.  Osteoporosis. You may be screened starting at age 66 if you are at high risk. Talk with your health care provider about your test results, treatment options, and if necessary, the need for more tests. Vaccines  Your health  care provider may recommend certain vaccines, such as:  Influenza vaccine. This is recommended every year.  Tetanus, diphtheria, and acellular pertussis (Tdap, Td) vaccine. You may need a Td booster every 10 years.  Zoster vaccine. You may need this after age  12.  Pneumococcal 13-valent conjugate (PCV13) vaccine. One dose is recommended after age 89.  Pneumococcal polysaccharide (PPSV23) vaccine. One dose is recommended after age 23. Talk to your health care provider about which screenings and vaccines you need and how often you need them. This information is not intended to replace advice given to you by your health care provider. Make sure you discuss any questions you have with your health care provider. Document Released: 03/20/2015 Document Revised: 11/11/2015 Document Reviewed: 12/23/2014 Elsevier Interactive Patient Education  2017 [place] Prevention in the Home Falls can cause injuries. They can happen to people of all ages. There are many things you can do to make your home safe and to help prevent falls. What can I do on the outside of my home?  Regularly fix the edges of walkways and driveways and fix any cracks.  Remove anything that might make you trip as you walk through a door, such as a raised step or threshold.  Trim any bushes or trees on the path to your home.  Use bright outdoor lighting.  Clear any walking paths of anything that might make someone trip, such as rocks or tools.  Regularly check to see if handrails are loose or broken. Make sure that both sides of any steps have handrails.  Any raised decks and porches should have guardrails on the edges.  Have any leaves, snow, or ice cleared regularly.  Use sand or salt on walking paths during winter.  Clean up any spills in your garage right away. This includes oil or grease spills. What can I do in the bathroom?  Use night lights.  Install grab bars by the toilet and in the tub and shower. Do not use towel bars as grab bars.  Use non-skid mats or decals in the tub or shower.  If you need to sit down in the shower, use a plastic, non-slip stool.  Keep the floor dry. Clean up any water that spills on the floor as soon as it happens.  Remove  soap buildup in the tub or shower regularly.  Attach bath mats securely with double-sided non-slip rug tape.  Do not have throw rugs and other things on the floor that can make you trip. What can I do in the bedroom?  Use night lights.  Make sure that you have a light by your bed that is easy to reach.  Do not use any sheets or blankets that are too big for your bed. They should not hang down onto the floor.  Have a firm chair that has side arms. You can use this for support while you get dressed.  Do not have throw rugs and other things on the floor that can make you trip. What can I do in the kitchen?  Clean up any spills right away.  Avoid walking on wet floors.  Keep items that you use a lot in easy-to-reach places.  If you need to reach something above you, use a strong step stool that has a grab bar.  Keep electrical cords out of the way.  Do not use floor polish or wax that makes floors slippery. If you must use wax, use non-skid floor wax.  Do not have  throw rugs and other things on the floor that can make you trip. What can I do with my stairs?  Do not leave any items on the stairs.  Make sure that there are handrails on both sides of the stairs and use them. Fix handrails that are broken or loose. Make sure that handrails are as long as the stairways.  Check any carpeting to make sure that it is firmly attached to the stairs. Fix any carpet that is loose or worn.  Avoid having throw rugs at the top or bottom of the stairs. If you do have throw rugs, attach them to the floor with carpet tape.  Make sure that you have a light switch at the top of the stairs and the bottom of the stairs. If you do not have them, ask someone to add them for you. What else can I do to help prevent falls?  Wear shoes that:  Do not have high heels.  Have rubber bottoms.  Are comfortable and fit you well.  Are closed at the toe. Do not wear sandals.  If you use a  stepladder:  Make sure that it is fully opened. Do not climb a closed stepladder.  Make sure that both sides of the stepladder are locked into place.  Ask someone to hold it for you, if possible.  Clearly mark and make sure that you can see:  Any grab bars or handrails.  First and last steps.  Where the edge of each step is.  Use tools that help you move around (mobility aids) if they are needed. These include:  Canes.  Walkers.  Scooters.  Crutches.  Turn on the lights when you go into a dark area. Replace any light bulbs as soon as they burn out.  Set up your furniture so you have a clear path. Avoid moving your furniture around.  If any of your floors are uneven, fix them.  If there are any pets around you, be aware of where they are.  Review your medicines with your doctor. Some medicines can make you feel dizzy. This can increase your chance of falling. Ask your doctor what other things that you can do to help prevent falls. This information is not intended to replace advice given to you by your health care provider. Make sure you discuss any questions you have with your health care provider. Document Released: 12/18/2008 Document Revised: 07/30/2015 Document Reviewed: 03/28/2014 Elsevier Interactive Patient Education  2017 [employer].

## 2020-03-08 ENCOUNTER — Other Ambulatory Visit: Payer: Self-pay | Admitting: Family Medicine

## 2020-03-12 ENCOUNTER — Other Ambulatory Visit: Payer: Self-pay

## 2020-03-12 ENCOUNTER — Ambulatory Visit (INDEPENDENT_AMBULATORY_CARE_PROVIDER_SITE_OTHER): Payer: Medicare HMO | Admitting: Otolaryngology

## 2020-03-12 VITALS — Temp 97.2°F

## 2020-03-12 DIAGNOSIS — H6123 Impacted cerumen, bilateral: Secondary | ICD-10-CM

## 2020-03-12 NOTE — Progress Notes (Signed)
HPI: Eddie Ferguson. Eddie Ferguson is a 80 y.o. male who presents for evaluation of wax buildup in his ear is referred by Jarold Motto, PA.  She cleaned most of the wax out in the office but referred him here for further cleaning.  Since using some eardrops at home the wife states that his hearing is gotten better..  Past Medical History:  Diagnosis Date  . Allergy   . Ankle injury 06/01/2001   no surgery- skin debridement   . Arthritis   . Basal cell carcinoma 05/28/2014   L nasal tip. Mohs Dr. Park Liter.    . Blood transfusion    2010 because of stomach ulcers  . Cataract    bilaterally removed  . Diabetes mellitus   . GERD (gastroesophageal reflux disease)   . Glaucoma   . Hyperlipidemia   . Hypertension   . Hypothyroidism   . Pneumonia 04/12/2006  . Prostate cancer (HCC)   . Seasonal allergies   . Sinusitis    treated for bacterial infection at least once a year at [facility]  . Stomach ulcer 2010   bleeding ulcer result NSAIDS   Past Surgical History:  Procedure Laterality Date  . abdominl abcess     03-15-2011  . BACK SURGERY    . CATARACT EXTRACTION, BILATERAL     04-07-2014, 03-17-2014  . COLONOSCOPY     2012, 5 year repeat  . CYSTOURETHROSCOPY  11/11/1998  . ESOPHAGOGASTRODUODENOSCOPY  2010   [facility]  . LUMBAR L[place]INECTOMY  2007   [place]  . MOHS SURGERY     left side of nose  . POLYPECTOMY    . PROSTATE BIOPSY     radiation 2016  . RECONSTRUCTION TENDON PULLEY W/ TENDON / FASCIAL GRAFT OF HAND / FINGER  1982   rt 3rd finger  . RECONSTRUCTION TENDON PULLEY W/ TENDON / FASCIAL GRAFT OF HAND / FINGER  1953   rt 5th finger  . UPPER GASTROINTESTINAL ENDOSCOPY     Social History   Socioeconomic History  . Marital status: Married    Spouse name: Not on file  . Number of children: 2  . Years of education: 4  . Highest education level: Not on file  Occupational History  . Occupation: retired  Tobacco Use  . Smoking status: Former Smoker    Quit date: 02/03/2001    Years  since quitting: 19.1  . Smokeless tobacco: Never Used  . Tobacco comment: intermittent cigar use stopped  Vaping Use  . Vaping Use: Never used  Substance and Sexual Activity  . Alcohol use: Yes    Alcohol/week: 0.0 standard drinks    Comment: occasional beer  . Drug use: No  . Sexual activity: Not Currently    Partners: Female  Other Topics Concern  . Not on file  Social History Narrative   Married (wife patient outside practice). 2 children. 5 grandkids.       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       Hobbies: travel, enjoys going to shows, [employer] [facility] women's basketball tournament. Doesn't watch tv.             Lives in 1 story condo on the 3rd floor   Bachelor's degree   Right handed   Social Determinants of Health   Financial Resource Strain: Low Risk   . Difficulty of Paying Living Expenses: Not hard at all  Food Insecurity: No Food Insecurity  . Worried About Programme researcher, broadcasting/film/video in the Last Year: Never true  .  Ran Out of Food in the Last Year: Never true  Transportation Needs: No Transportation Needs  . Lack of Transportation (Medical): No  . Lack of Transportation (Non-Medical): No  Physical Activity: Inactive  . Days of Exercise per Week: 0 days  . Minutes of Exercise per Session: 0 min  Stress: No Stress Concern Present  . Feeling of Stress : Not at all  Social Connections: Socially Isolated  . Frequency of Communication with Friends and Family: Never  . Frequency of Social Gatherings with Friends and Family: Twice a week  . Attends Religious Services: Never  . Active Member of Clubs or Organizations: No  . Attends Banker Meetings: Never  . Marital Status: Married   Family History  Problem Relation Age of Onset  . Diabetes Mother   . Diabetes Father   . Hyperlipidemia Father   . Colon polyps Father   . Brain cancer Brother        smoker  . Dementia Paternal Grandmother   . Colon cancer Neg Hx   . Esophageal cancer Neg Hx   . Stomach  cancer Neg Hx   . Rectal cancer Neg Hx    Allergies  Allergen Reactions  . Aspirin Other (See Comments)    Stomach ulcers  . Nsaids Other (See Comments)    Stomach ulcers  . Amoxicillin-Pot Clavulanate Rash  . Sulfa Antibiotics Swelling and Rash   Prior to Admission medications   Medication Sig Start Date End Date Taking? Authorizing Provider  acetaminophen (TYLENOL) 500 MG tablet Take 500 mg by mouth every 6 (six) hours as needed. Reported on 06/16/2015    [provider]  allopurinol (ZYLOPRIM) 100 MG tablet Take 1 tablet (100 mg total) by mouth 2 (two) times daily. 09/17/19   [name], MD  amLODipine (NORVASC) 5 MG tablet TAKE 1 TABLET BY MOUTH EVERY DAY 12/26/19   [name], MD  carvedilol (COREG) 25 MG tablet TAKE 1/2 TABLETS BY MOUTH 2 TIMES DAILY WITH A MEAL. 09/17/19   [name], MD  chlorthalidone (HYGROTON) 25 MG tablet TAKE 1/2-1 TABLET BY MOUTH DAILY. 12/13/19   [name], MD  Cholecalciferol (VITAMIN D) 2000 UNITS CAPS Take by mouth daily.    [provider]  donepezil (ARICEPT) 10 MG tablet Take 1 tablet (10 mg total) by mouth daily. 07/12/19   Van Clines, MD  famotidine (PEPCID) 20 MG tablet Take 20 mg by mouth daily.    [provider]  fexofenadine (ALLEGRA) 180 MG tablet Take 180 mg by mouth.    [provider]  fluticasone (FLONASE) 50 MCG/ACT nasal spray Place 2 sprays into both nostrils daily. 09/17/19   [name], MD  levothyroxine (SYNTHROID) 50 MCG tablet TAKE 1 TABLET BY MOUTH EVERY DAY 03/09/20   [name], MD  memantine (NAMENDA) 10 MG tablet TAKE 1 TABLET EVERY NIGHT FOR 2 WEEKS, THEN INCREASE TO 1 TABLET TWICE A DAY AND CONTINUE 01/23/20   Van Clines, MD  Multiple Vitamin (MULTIVITAMIN) tablet Take 1 tablet by mouth daily.    [provider]  NON FORMULARY CBD oil. 3 gtts qd prn    [provider]  potassium chloride SA (KLOR-CON M20) 20 MEQ tablet Take 2  tablets (40 mEq total) by mouth 2 (two) times daily. 09/17/19   [name], MD  pravastatin (PRAVACHOL) 40 MG tablet TAKE 1 TABLET BY MOUTH EVERY DAY 03/09/20   [name], MD  quinapril (ACCUPRIL) 40 MG tablet TAKE 1 TABLET BY MOUTH EVERY DAY 03/09/20   [name], MD     Positive ROS: Otherwise negative  All other systems have been reviewed and were otherwise negative with the exception of those mentioned in the HPI and as above.  Physical Exam: Constitutional: Alert, well-appearing, no acute distress Ears: External ears without lesions or tenderness. Ear canals mild amount of wax deep in the left ear canal that was cleaned with suction.  The TM itself was clear with good mobility on pneumatic otoscopy.  The right ear canal had a little bit less wax that was again cleaned with suction and forceps.  The TM was clear otherwise.  On hearing screening with the 1024 tuning fork he had a mild hearing loss in both ears with AC > BC bilaterally.  He does not complain of hearing problems presently but does have some underlying mild to moderate sensorineural hearing loss.. Nasal: External nose without lesions. Clear nasal passages Oral: Oropharynx clear. Neck: No palpable adenopathy or masses Respiratory: Breathing comfortably  Skin: No facial/neck lesions or rash noted.  Cerumen impaction removal  Date/Time: 03/12/2020 4:10 PM Performed by: Drema Halon, MD Authorized by: Drema Halon, MD   Consent:    Consent obtained:  Verbal   Consent given by:  Patient   Risks discussed:  Pain and bleeding Procedure details:    Location:  L ear and R ear   Procedure type: curette, suction and forceps   Post-procedure details:    Inspection:  TM intact and canal normal   Hearing quality:  Improved   Patient tolerance of procedure:  Tolerated well, no immediate complications Comments:     TMs are clear bilaterally.  That was his name    Assessment: Cerumen buildup  left side worse than right. Mild underlying sensorineural hearing loss  Plan: Ear canals were cleaned in the office.  Briefly discussed with the patient as well as his wife that he has a mild hearing loss.  At this time he is functioning well but may need to eventually obtain hearing test and use hearing aids.  Narda Bonds, MD

## 2020-03-23 NOTE — Progress Notes (Deleted)
Phone: [phone]   Subjective:  Patient presents today for their annual physical. Chief complaint-noted.   See problem oriented charting- ROS- full  review of systems was completed and negative  except for: ***  The following were reviewed and entered/updated in epic: Past Medical History:  Diagnosis Date  . Allergy   . Ankle injury 06/01/2001   no surgery- skin debridement   . Arthritis   . Basal cell carcinoma 05/28/2014   L nasal tip. Mohs Dr. Link Snuffer.    . Blood transfusion    2010 because of stomach ulcers  . Cataract    bilaterally removed  . Diabetes mellitus   . GERD (gastroesophageal reflux disease)   . Glaucoma   . Hyperlipidemia   . Hypertension   . Hypothyroidism   . Pneumonia 04/12/2006  . Prostate cancer ([place])   . Seasonal allergies   . Sinusitis    treated for bacterial infection at least once a year at [facility]  . Stomach ulcer 2010   bleeding ulcer result NSAIDS   Patient Active Problem List   Diagnosis Date Noted  . Aortic atherosclerosis ([place]) 01/05/2018  . Mild dementia ([place]) 09/26/2017  . CKD (chronic kidney disease), stage III ([place]) 04/18/2017  . Gout 07/09/2015  . Mild cognitive impairment 06/16/2015  . Adenomatous colon polyp 05/28/2014  . History of prostate cancer-follows with Dr. Alinda Money of [facility] 05/28/2014  . Osteoarthritis 05/28/2014  . Erectile dysfunction 05/28/2014  . Basal cell carcinoma 05/28/2014  . Essential hypertension 04/23/2014  . Hyperlipidemia 04/23/2014  . GERD (gastroesophageal reflux disease) 04/23/2014  . 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 Hypothyroidism 04/23/2014  . Allergic rhinitis 04/23/2014  . Glaucoma 04/23/2014  . History of nonmelanoma skin cancer 11/18/2012  . History of shingles 04/24/2011   Past Surgical History:  Procedure Laterality Date  . abdominl abcess     03-15-2011  . BACK SURGERY    . CATARACT EXTRACTION, BILATERAL     04-07-2014,  03-17-2014  . COLONOSCOPY     2012, 5 year repeat  . CYSTOURETHROSCOPY  11/11/1998  . ESOPHAGOGASTRODUODENOSCOPY  2010   [facility]  . LUMBAR LAMINECTOMY  2007   [place]  . MOHS SURGERY     left side of nose  . POLYPECTOMY    . PROSTATE BIOPSY     radiation 2016  . RECONSTRUCTION TENDON PULLEY W/ TENDON / FASCIAL GRAFT OF HAND / FINGER  1982   rt 3rd finger  . RECONSTRUCTION TENDON PULLEY W/ TENDON / FASCIAL GRAFT OF HAND / FINGER  1953   rt 5th finger  . UPPER GASTROINTESTINAL ENDOSCOPY      Family History  Problem Relation Age of Onset  . Diabetes Mother   . Diabetes Father   . Hyperlipidemia Father   . Colon polyps Father   . Brain cancer Brother        smoker  . Dementia Paternal Grandmother   . Colon cancer Neg Hx   . Esophageal cancer Neg Hx   . Stomach cancer Neg Hx   . Rectal cancer Neg Hx     Medications- reviewed and updated Current Outpatient Medications  Medication Sig Dispense Refill  . acetaminophen (TYLENOL) 500 MG tablet Take 500 mg by mouth every 6 (six) hours as needed. Reported on 06/16/2015    . allopurinol (ZYLOPRIM) 100 MG tablet Take 1 tablet (100 mg total) by mouth 2 (two) times daily. 180 tablet 3  . amLODipine (NORVASC) 5 MG  tablet TAKE 1 TABLET BY MOUTH EVERY DAY 90 tablet 1  . carvedilol (COREG) 25 MG tablet TAKE 1/2 TABLETS BY MOUTH 2 TIMES DAILY WITH A MEAL. 90 tablet 3  . chlorthalidone (HYGROTON) 25 MG tablet TAKE 1/2-1 TABLET BY MOUTH DAILY. 90 tablet 1  . Cholecalciferol (VITAMIN D) 2000 UNITS CAPS Take by mouth daily.    Marland Kitchen donepezil (ARICEPT) 10 MG tablet Take 1 tablet (10 mg total) by mouth daily. 90 tablet 3  . famotidine (PEPCID) 20 MG tablet Take 20 mg by mouth daily.    . fexofenadine (ALLEGRA) 180 MG tablet Take 180 mg by mouth.    . fluticasone (FLONASE) 50 MCG/ACT nasal spray Place 2 sprays into both nostrils daily. 48 g 3  . levothyroxine (SYNTHROID) 50 MCG tablet TAKE 1 TABLET BY MOUTH EVERY DAY 90 tablet 3  . memantine (NAMENDA) 10  MG tablet TAKE 1 TABLET EVERY NIGHT FOR 2 WEEKS, THEN INCREASE TO 1 TABLET TWICE A DAY AND CONTINUE 180 tablet 1  . Multiple Vitamin (MULTIVITAMIN) tablet Take 1 tablet by mouth daily.    . NON FORMULARY CBD oil. 3 gtts qd prn    . potassium chloride SA (KLOR-CON M20) 20 MEQ tablet Take 2 tablets (40 mEq total) by mouth 2 (two) times daily. 360 tablet 3  . pravastatin (PRAVACHOL) 40 MG tablet TAKE 1 TABLET BY MOUTH EVERY DAY 90 tablet 3  . quinapril (ACCUPRIL) 40 MG tablet TAKE 1 TABLET BY MOUTH EVERY DAY 90 tablet 3   No current facility-administered medications for this visit.    Allergies-reviewed and updated Allergies  Allergen Reactions  . Aspirin Other (See Comments)    Stomach ulcers  . Nsaids Other (See Comments)    Stomach ulcers  . Amoxicillin-Pot Clavulanate Rash  . Sulfa Antibiotics Swelling and Rash    Social History   Social History Narrative   Married (wife patient outside practice). 2 children. 5 grandkids.       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       Hobbies: travel, enjoys going to shows, [facility] women's basketball tournament. Doesn't watch tv.             Lives in 1 story condo on the 3rd floor   Bachelor's degree   Right handed   Objective  Objective:  There were no vitals taken for this visit. Gen: NAD, resting comfortably HEENT: Mucous membranes are moist. Oropharynx normal Neck: no thyromegaly CV: RRR no murmurs rubs or gallops Lungs: CTAB no crackles, wheeze, rhonchi Abdomen: soft/nontender/nondistended/normal bowel sounds. No rebound or guarding.  Ext: no edema Skin: warm, dry Neuro: grossly normal, moves all extremities, PERRLA ***   Assessment and Plan  80 y.o. male presenting for annual physical.  Health Maintenance counseling: 1. Anticipatory guidance: Patient counseled regarding regular dental exams ***q6 months, eye exams ***yearly,  avoiding smoking and second hand smoke*** , limiting alcohol to 2 beverages per day ***.   2.  Risk factor reduction:  Advised patient of need for regular exercise and diet rich and fruits and vegetables to reduce risk of heart attack and stroke. Exercise- ***. Diet-***.  Wt Readings from Last 3 Encounters:  02/20/20 207 lb 9.6 oz (94.2 kg)  01/08/20 210 lb (95.3 kg)  10/24/19 206 lb 9.6 oz (93.7 kg)   3. Immunizations/screenings/ancillary studies Immunization History  Administered Date(s) Administered  . Fluad Quad(high Dose 65+) 01/07/2019, 01/08/2020  . Hepatitis B 06/19/2007, 11/16/2007, 04/18/2008  . Hepatitis B, ped/adol 06/19/2007, 11/16/2007, 04/18/2008  .  Influenza Whole 01/27/2012  . Influenza, High Dose Seasonal PF 12/21/2015, 12/20/2016, 12/01/2017  . Influenza,inj,Quad PF,6+ Mos 02/24/2015  . Influenza-Unspecified 01/27/2012, 03/22/2013, 01/21/2014  . Meningococcal Polysaccharide 12/19/2008  . PFIZER(Purple Top)SARS-COV-2 Vaccination 03/27/2019, 04/17/2019, 12/10/2019  . Pneumococcal Conjugate-13 08/25/2014  . Pneumococcal Polysaccharide-23 06/19/2007, 12/19/2008, 12/21/2015  . Td 04/23/2014  . Tdap 03/07/2001  . Zoster 08/12/2008   Health Maintenance Due  Topic Date Due  . HEMOGLOBIN A1C  03/19/2020  . FOOT EXAM  03/20/2020   4. Prostate cancer screening- ***  Lab Results  Component Value Date   PSA 0.09 (L) 05/24/2018   PSA 0.12 01/03/2018   PSA 0.137 07/12/2017   5. Colon cancer screening - *** 6. Skin cancer screening- ***advised regular sunscreen use. Denies worrisome, changing, or new skin lesions.  7. *** smoker 8. STD screening - ***  Status of chronic or acute concerns  #hyperlipidemia S: Medication: Pravastatin 40Mg  Lab Results  Component Value Date   CHOL 140 03/21/2019   HDL 54.60 03/21/2019   LDLCALC 55 03/21/2019   LDLDIRECT 90.0 09/28/2016   TRIG 152.0 (H) 03/21/2019   CHOLHDL 3 03/21/2019   A/P: ***  #Gout S: *** flares in *** on Allopurinol 100Mg  Lab Results  Component Value Date   LABURIC 6.3 03/21/2019   A/P:***    #hypothyroidism S: compliant On thyroid medication-Levothyroxine 50Mcg Lab Results  Component Value Date   TSH 2.65 09/17/2019    ROS-No hair or nail changes. No heat/cold intolerance. No constipation or diarrhea. Denies shakiness or anxiety.  A/P:***   # Diabetes S: Medication:***  CBGs- *** Exercise and diet- *** Lab Results  Component Value Date   HGBA1C 6.4 09/17/2019   HGBA1C 5.9 (A) 03/21/2019   HGBA1C 6.7 (H) 04/27/2018    A/P: ***  #hypertension S: medication: Quinapril 40Mg , chlortalidone 25Mg , carvedilol 25Mg , amlodipine 5Mg  Home readings #s: *** BP Readings from Last 3 Encounters:  02/20/20 120/68  01/08/20 120/74  10/24/19 (!) 146/76  A/P: ***   01/15/2019 AWV AND 03/21/2019 CPE***  ***Ophthodoes not appear to know he has diabetes  ***Prolonged get up and go at 15 seconds and not exercising off wellness visit *** No diagnosis found.  Recommended follow up: ***No follow-ups on file. Future Appointments  Date Time Provider [place]  03/25/2020  9:20 AM Marin Olp, MD [facility]  09/22/2020  8:30 AM Cameron Sprang, MD [facility] None  02/25/2021  9:30 AM [facility] COACH [facility]    No chief complaint on file.  Lab/Order associations:*** fasting No diagnosis found.  No orders of the defined types were placed in this encounter.   Return precautions advised.  Clyde Lundborg, CMA

## 2020-03-25 ENCOUNTER — Encounter: Payer: Medicare HMO | Admitting: Family Medicine

## 2020-03-25 DIAGNOSIS — Z0000 Encounter for general adult medical examination without abnormal findings: Secondary | ICD-10-CM

## 2020-03-25 DIAGNOSIS — I10 Essential (primary) hypertension: Secondary | ICD-10-CM

## 2020-03-25 DIAGNOSIS — N1831 Chronic kidney disease, stage 3a: Secondary | ICD-10-CM

## 2020-03-25 DIAGNOSIS — E785 Hyperlipidemia, unspecified: Secondary | ICD-10-CM

## 2020-03-25 DIAGNOSIS — E039 Hypothyroidism, unspecified: Secondary | ICD-10-CM

## 2020-03-25 DIAGNOSIS — M1A0720 Idiopathic chronic gout, left ankle and foot, without tophus (tophi): Secondary | ICD-10-CM

## 2020-03-25 DIAGNOSIS — K219 Gastro-esophageal reflux disease without esophagitis: Secondary | ICD-10-CM

## 2020-05-06 NOTE — Patient Instructions (Addendum)
Health Maintenance Due  Topic Date Due  . OPHTHALMOLOGY EXAM  Appointment scheduled for 05/20/2020 04/29/2020   Please stop by lab before you go If you have mychart- we will send your results within 3 business days of [place] receiving them.  If you do not have mychart- we will call you about results within 5 business days of [place] receiving them.  *please also note that you will see labs on mychart as soon as they post. I will later go in and write notes on them- will say "notes from Dr. Yong Channel"  Lets trial either align or florastor probiotic for 1-2 months to see if we can hit a "reset" button on bacteria in gut. Some of this could be from your medicines  Also team give him stool cards so he can complete these- if any blood in stool we may refer to stomach doctors.   Recommended follow up: keep visit on 3/15 but doing all labs today other than PSA which you will do with Dr. Alinda Money

## 2020-05-06 NOTE — Progress Notes (Signed)
Phone [phone] In person visit   Subjective:   Eddie Ferguson is a 80 y.o. year old very pleasant male patient who presents for/with See problem oriented charting Chief Complaint  Patient presents with  . GI Problem    Stomach pain, diarrhea , every other day last week.     This visit occurred during the SARS-CoV-2 public health emergency.  Safety protocols were in place, including screening questions prior to the visit, additional usage of staff PPE, and extensive cleaning of exam room while observing appropriate contact time as indicated for disinfecting solutions.   Past Medical History-  Patient Active Problem List   Diagnosis Date Noted  . Mild dementia ([place]) 09/26/2017    Priority: High  . Mild cognitive impairment 06/16/2015    Priority: High  . History of prostate cancer-follows with [facility] of [facility] 05/28/2014    Priority: High  . Type 2 diabetes mellitus with diabetic chronic kidney disease ([place]) 04/23/2014    Priority: High  . Aortic atherosclerosis ([place]) 01/05/2018    Priority: Medium  . CKD (chronic kidney disease), stage III ([place]) 04/18/2017    Priority: Medium  . Gout 07/09/2015    Priority: Medium  . Essential hypertension 04/23/2014    Priority: Medium  . Hyperlipidemia 04/23/2014    Priority: Medium  . Hypothyroidism 04/23/2014    Priority: Medium  . Adenomatous colon polyp 05/28/2014    Priority: Low  . Osteoarthritis 05/28/2014    Priority: Low  . Erectile dysfunction 05/28/2014    Priority: Low  . Basal cell carcinoma 05/28/2014    Priority: Low  . GERD (gastroesophageal reflux disease) 04/23/2014    Priority: Low  . Allergic rhinitis 04/23/2014    Priority: Low  . Glaucoma 04/23/2014    Priority: Low  . History of nonmelanoma skin cancer 11/18/2012    Priority: Low  . History of shingles 04/24/2011    Medications- reviewed and updated Current Outpatient Medications  Medication Sig Dispense Refill  . acetaminophen  (TYLENOL) 500 MG tablet Take 500 mg by mouth every 6 (six) hours as needed. Reported on 06/16/2015    . allopurinol (ZYLOPRIM) 100 MG tablet Take 1 tablet (100 mg total) by mouth 2 (two) times daily. 180 tablet 3  . amLODipine (NORVASC) 5 MG tablet TAKE 1 TABLET BY MOUTH EVERY DAY 90 tablet 1  . carvedilol (COREG) 25 MG tablet TAKE 1/2 TABLETS BY MOUTH 2 TIMES DAILY WITH A MEAL. 90 tablet 3  . chlorthalidone (HYGROTON) 25 MG tablet TAKE 1/2-1 TABLET BY MOUTH DAILY. 90 tablet 1  . donepezil (ARICEPT) 10 MG tablet Take 1 tablet (10 mg total) by mouth daily. 90 tablet 3  . famotidine (PEPCID) 20 MG tablet Take 20 mg by mouth daily.    . fexofenadine (ALLEGRA) 180 MG tablet Take 180 mg by mouth.    . fluticasone (FLONASE) 50 MCG/ACT nasal spray Place 2 sprays into both nostrils daily. 48 g 3  . levothyroxine (SYNTHROID) 50 MCG tablet TAKE 1 TABLET BY MOUTH EVERY DAY 90 tablet 3  . memantine (NAMENDA) 10 MG tablet TAKE 1 TABLET EVERY NIGHT FOR 2 WEEKS, THEN INCREASE TO 1 TABLET TWICE A DAY AND CONTINUE 180 tablet 1  . Multiple Vitamin (MULTIVITAMIN) tablet Take 1 tablet by mouth daily.    . NON FORMULARY CBD oil. 3 gtts qd prn    . potassium chloride SA (KLOR-CON M20) 20 MEQ tablet Take 2 tablets (40 mEq total) by mouth 2 (two) times daily.  360 tablet 3  . pravastatin (PRAVACHOL) 40 MG tablet TAKE 1 TABLET BY MOUTH EVERY DAY 90 tablet 3  . quinapril (ACCUPRIL) 40 MG tablet TAKE 1 TABLET BY MOUTH EVERY DAY 90 tablet 3   No current facility-administered medications for this visit.     Objective:  BP 116/70   Pulse 61   Temp (!) 97.2 F (36.2 C) (Temporal)   Ht 5\' 9"  (1.753 m)   Wt 208 lb 3.2 oz (94.4 kg)   SpO2 94%   BMI 30.75 kg/m  Gen: NAD, resting comfortably CV: RRR no murmurs rubs or gallops Lungs: CTAB no crackles, wheeze, rhonchi Abdomen: soft/nontender/nondistended/normal bowel sounds. No rebound or guarding.  Ext: Trace edema Skin: warm, dry     Assessment and Plan  Stomach  Issues S: symptoms started last week more significantly but has had some stomach issues for a while.   Years of having sensitive stomach - some incontinence. Has tried to adjust foods and has not been able to adjust to help significantly. Stomach is very noisy. Eats nuts daily for arthritis- trialed off for a few days and no difference and now doing every other day Stopped vitamin D to see if that would help   More recently starting last week as above started with diarrhea, stomach pain, not feeling well overall (this was new). Having some loose stools but not clearly diarrhea at this point. Has not tried probiotics yet. No blood in stool or melena. No mucus in stool.  Pepto-Bismol was not helpful but has not taken in recent days A/P: Patient with several years of a sensitive stomach.  We discussed some of his medications could certainly be associated such as allopurinol, Namenda, Aricept.  We do not feel strongly about stopping these medications.  Patient with significant flareup of loose stools/diarrhea last week-we will test for Covid though strongly doubt.  We will also update blood work as he was feeling some fatigue-plus go ahead and order labs for his upcoming physical.  Not actively having diarrhea so cannot do stool cards  He had a colonoscopy in 2018 but we opted to check for blood in stool.  No recent Pepto-Bismol.  If blood in stool consider GI consult with history of adenomatous polyps or also if has any anemia  #hypothyroidism S: compliant On thyroid medication-levothyroxine 50 mcg Lab Results  Component Value Date   TSH 2.65 09/17/2019   A/P: Update TSH as changes in stool concern to be associated with thyroid function-hopefully stable-update today  #hyperlipidemia S: Medication: Pravastatin 40 mg Lab Results  Component Value Date   CHOL 140 03/21/2019   HDL 54.60 03/21/2019   LDLCALC 55 03/21/2019   LDLDIRECT 90.0 09/28/2016   TRIG 152.0 (H) 03/21/2019   CHOLHDL 3  03/21/2019   A/P: Hopefully stable/controlled-update levels today  #hypertension S: medication: Chlorthalidone 25 mg-sometimes just takes half, carvedilol 12.5 mg twice daily, amlodipine 5 mg.  Also takes potassium chlorthalidone BP Readings from Last 3 Encounters:  05/07/20 116/70  02/20/20 120/68  01/08/20 120/74  A/P:  Stable. Continue current medications.  With looser stools and chlorthalidone-update electrolytes   Recommended follow up: Keep visit on March 15 Future Appointments  Date Time Provider [place]  05/19/2020 10:00 AM Marin Olp, MD [facility]  09/22/2020  8:30 AM Cameron Sprang, MD [facility] None  02/25/2021  9:30 AM [facility] HEALTH COACH [facility]    Lab/Order associations:   ICD-10-CM   1. Diarrhea, unspecified type  R19.7 Novel  Coronavirus, NAA ([facility])    Fecal occult blood, imunochemical  2. Encounter for screening for COVID-19  Z11.52 Novel Coronavirus, NAA ([facility])  3. Type 2 diabetes mellitus with stage 3a chronic kidney disease, without long-term current use of insulin (HCC)  E11.22 CBC with Differential/Platelet   N18.31 Comprehensive metabolic panel    Hemoglobin A1c    Lipid panel  4. Hypothyroidism, unspecified type  E03.9 TSH  5. Hyperlipidemia, unspecified hyperlipidemia type  E78.5 Lipid panel  6. Idiopathic chronic gout of left foot without tophus  M1A.0720 Uric acid    Return precautions advised.  Garret Reddish, MD

## 2020-05-07 ENCOUNTER — Encounter: Payer: Self-pay | Admitting: Family Medicine

## 2020-05-07 ENCOUNTER — Other Ambulatory Visit: Payer: Self-pay

## 2020-05-07 ENCOUNTER — Ambulatory Visit (INDEPENDENT_AMBULATORY_CARE_PROVIDER_SITE_OTHER): Payer: Medicare HMO | Admitting: Family Medicine

## 2020-05-07 VITALS — BP 116/70 | HR 61 | Temp 97.2°F | Ht 69.0 in | Wt 208.2 lb

## 2020-05-07 DIAGNOSIS — R197 Diarrhea, unspecified: Secondary | ICD-10-CM | POA: Diagnosis not present

## 2020-05-07 DIAGNOSIS — E1122 Type 2 diabetes mellitus with diabetic chronic kidney disease: Secondary | ICD-10-CM | POA: Diagnosis not present

## 2020-05-07 DIAGNOSIS — E039 Hypothyroidism, unspecified: Secondary | ICD-10-CM | POA: Diagnosis not present

## 2020-05-07 DIAGNOSIS — Z1152 Encounter for screening for COVID-19: Secondary | ICD-10-CM

## 2020-05-07 DIAGNOSIS — M1A0720 Idiopathic chronic gout, left ankle and foot, without tophus (tophi): Secondary | ICD-10-CM | POA: Diagnosis not present

## 2020-05-07 DIAGNOSIS — N1831 Chronic kidney disease, stage 3a: Secondary | ICD-10-CM | POA: Diagnosis not present

## 2020-05-07 DIAGNOSIS — E785 Hyperlipidemia, unspecified: Secondary | ICD-10-CM | POA: Diagnosis not present

## 2020-05-07 LAB — CBC WITH DIFFERENTIAL/PLATELET
Basophils Absolute: 0.1 10*3/uL (ref 0.0–0.1)
Basophils Relative: 0.7 % (ref 0.0–3.0)
Eosinophils Absolute: 0.3 10*3/uL (ref 0.0–0.7)
Eosinophils Relative: 3.8 % (ref 0.0–5.0)
HCT: 42.4 % (ref 39.0–52.0)
Hemoglobin: 14.6 g/dL (ref 13.0–17.0)
Lymphocytes Relative: 16.6 % (ref 12.0–46.0)
Lymphs Abs: 1.3 10*3/uL (ref 0.7–4.0)
MCHC: 34.6 g/dL (ref 30.0–36.0)
MCV: 92.5 fl (ref 78.0–100.0)
Monocytes Absolute: 0.7 10*3/uL (ref 0.1–1.0)
Monocytes Relative: 9.3 % (ref 3.0–12.0)
Neutro Abs: 5.6 10*3/uL (ref 1.4–7.7)
Neutrophils Relative %: 69.6 % (ref 43.0–77.0)
Platelets: 167 10*3/uL (ref 150.0–400.0)
RBC: 4.58 Mil/uL (ref 4.22–5.81)
RDW: 14.4 % (ref 11.5–15.5)
WBC: 8 10*3/uL (ref 4.0–10.5)

## 2020-05-07 LAB — COMPREHENSIVE METABOLIC PANEL
ALT: 16 U/L (ref 0–53)
AST: 18 U/L (ref 0–37)
Albumin: 4 g/dL (ref 3.5–5.2)
Alkaline Phosphatase: 63 U/L (ref 39–117)
BUN: 15 mg/dL (ref 6–23)
CO2: 31 mEq/L (ref 19–32)
Calcium: 9.4 mg/dL (ref 8.4–10.5)
Chloride: 102 mEq/L (ref 96–112)
Creatinine, Ser: 1.24 mg/dL (ref 0.40–1.50)
GFR: 54.36 mL/min — ABNORMAL LOW (ref >60.00)
Glucose, Bld: 112 mg/dL — ABNORMAL HIGH (ref 70–99)
Potassium: 3.6 mEq/L (ref 3.5–5.1)
Sodium: 140 mEq/L (ref 135–145)
Total Bilirubin: 0.6 mg/dL (ref 0.2–1.2)
Total Protein: 5.8 g/dL — ABNORMAL LOW (ref 6.0–8.3)

## 2020-05-07 LAB — LIPID PANEL
Cholesterol: 117 mg/dL (ref 0–200)
HDL: 47.7 mg/dL (ref >39.00)
LDL Cholesterol: 40 mg/dL (ref 0–99)
NonHDL: 69.17
Total CHOL/HDL Ratio: 2
Triglycerides: 144 mg/dL (ref 0.0–149.0)
VLDL: 28.8 mg/dL (ref 0.0–40.0)

## 2020-05-07 LAB — HEMOGLOBIN A1C: Hgb A1c MFr Bld: 6.4 % (ref 4.6–6.5)

## 2020-05-07 LAB — URIC ACID: Uric Acid, Serum: 5.7 mg/dL (ref 4.0–7.8)

## 2020-05-07 LAB — TSH: TSH: 3.58 u[IU]/mL (ref 0.35–4.50)

## 2020-05-08 LAB — NOVEL CORONAVIRUS, NAA: SARS-CoV-2, NAA: NOT DETECTED

## 2020-05-08 LAB — SARS-COV-2, NAA 2 DAY TAT

## 2020-05-12 ENCOUNTER — Telehealth: Payer: Self-pay | Admitting: Family Medicine

## 2020-05-12 ENCOUNTER — Telehealth: Payer: Self-pay

## 2020-05-12 ENCOUNTER — Other Ambulatory Visit: Payer: Self-pay

## 2020-05-12 ENCOUNTER — Other Ambulatory Visit (INDEPENDENT_AMBULATORY_CARE_PROVIDER_SITE_OTHER): Payer: Medicare HMO

## 2020-05-12 DIAGNOSIS — R195 Other fecal abnormalities: Secondary | ICD-10-CM

## 2020-05-12 DIAGNOSIS — R197 Diarrhea, unspecified: Secondary | ICD-10-CM | POA: Diagnosis not present

## 2020-05-12 LAB — FECAL OCCULT BLOOD, IMMUNOCHEMICAL: Fecal Occult Bld: POSITIVE — AB

## 2020-05-12 NOTE — Telephone Encounter (Signed)
CRITICAL VALUE STICKER  CRITICAL VALUE: POS IFOB  RECEIVER (on-site recipient of call):Reegan Bouffard  DATE & TIME NOTIFIED: 05/12/2020 11:15 am  MESSENGER (representative from lab): Rolly Salter from [facility] for Pos IFOB result  MD NOTIFIED:Stephen O. Hunter  TIME OF NOTIFICATION: 11:20am  RESPONSE:

## 2020-05-12 NOTE — Telephone Encounter (Signed)
Pt.'s wife is returning call again

## 2020-05-12 NOTE — Telephone Encounter (Signed)
Pt's wife returning call.

## 2020-05-18 NOTE — Patient Instructions (Addendum)
Health Maintenance Due  Topic Date Due  . OPHTHALMOLOGY EXAM - please make sure they know you have diabetes and to send [place] a copy of report  04/29/2020   Team please give him # for [facility]- he has not heard yet about his colonoscopy. If new or worsening symptoms let us know.    Please check with your pharmacy to see if they have the shingrix vaccine. If they do- please get this immunization and update [place] by phone call or mychart with dates you receive the vaccine

## 2020-05-18 NOTE — Progress Notes (Signed)
Phone: [phone]   Subjective:  Patient presents today for their annual physical. Chief complaint-noted.   See problem oriented charting- ROS- full  review of systems was completed and negative  except for: looser stools, stable memory loss  The following were reviewed and entered/updated in epic: Past Medical History:  Diagnosis Date  . Allergy   . Ankle injury 06/01/2001   no surgery- skin debridement   . Arthritis   . Basal cell carcinoma 05/28/2014   L nasal tip. Mohs Dr. Link Snuffer.    . Blood transfusion    2010 because of stomach ulcers  . Cataract    bilaterally removed  . Diabetes mellitus   . GERD (gastroesophageal reflux disease)   . Glaucoma   . Hyperlipidemia   . Hypertension   . Hypothyroidism   . Pneumonia 04/12/2006  . Prostate cancer ([place])   . Seasonal allergies   . Sinusitis    treated for bacterial infection at least once a year at [facility]  . Stomach ulcer 2010   bleeding ulcer result NSAIDS   Patient Active Problem List   Diagnosis Date Noted  . Mild dementia ([place]) 06/16/2015    Priority: High  . History of prostate cancer-follows with [facility] of [facility] 05/28/2014    Priority: High  . Type 2 diabetes mellitus with diabetic chronic kidney disease ([place]) 04/23/2014    Priority: High  . Aortic atherosclerosis ([place]) 01/05/2018    Priority: Medium  . CKD (chronic kidney disease), stage III ([place]) 04/18/2017    Priority: Medium  . Gout 07/09/2015    Priority: Medium  . Essential hypertension 04/23/2014    Priority: Medium  . Hyperlipidemia 04/23/2014    Priority: Medium  . Hypothyroidism 04/23/2014    Priority: Medium  . Adenomatous colon polyp 05/28/2014    Priority: Low  . Osteoarthritis 05/28/2014    Priority: Low  . Erectile dysfunction 05/28/2014    Priority: Low  . Basal cell carcinoma 05/28/2014    Priority: Low  . GERD (gastroesophageal reflux disease) 04/23/2014    Priority: Low  . Allergic rhinitis 04/23/2014     Priority: Low  . Glaucoma 04/23/2014    Priority: Low  . History of nonmelanoma skin cancer 11/18/2012    Priority: Low  . History of shingles 04/24/2011   Past Surgical History:  Procedure Laterality Date  . abdominl abcess     03-15-2011  . BACK SURGERY    . CATARACT EXTRACTION, BILATERAL     04-07-2014, 03-17-2014  . COLONOSCOPY     2012, 5 year repeat  . CYSTOURETHROSCOPY  11/11/1998  . ESOPHAGOGASTRODUODENOSCOPY  2010   [facility]  . LUMBAR LAMINECTOMY  2007   [place]  . MOHS SURGERY     left side of nose  . POLYPECTOMY    . PROSTATE BIOPSY     radiation 2016  . RECONSTRUCTION TENDON PULLEY W/ TENDON / FASCIAL GRAFT OF HAND / FINGER  1982   rt 3rd finger  . RECONSTRUCTION TENDON PULLEY W/ TENDON / FASCIAL GRAFT OF HAND / FINGER  1953   rt 5th finger  . UPPER GASTROINTESTINAL ENDOSCOPY      Family History  Problem Relation Age of Onset  . Diabetes Mother   . Diabetes Father   . Hyperlipidemia Father   . Colon polyps Father   . Brain cancer Brother        smoker  . Dementia Paternal Grandmother   . Colon cancer Neg Hx   . Esophageal cancer  Neg Hx   . Stomach cancer Neg Hx   . Rectal cancer Neg Hx     Medications- reviewed and updated Current Outpatient Medications  Medication Sig Dispense Refill  . acetaminophen (TYLENOL) 500 MG tablet Take 500 mg by mouth every 6 (six) hours as needed. Reported on 06/16/2015    . allopurinol (ZYLOPRIM) 100 MG tablet Take 1 tablet (100 mg total) by mouth 2 (two) times daily. 180 tablet 3  . amLODipine (NORVASC) 5 MG tablet TAKE 1 TABLET BY MOUTH EVERY DAY 90 tablet 1  . carvedilol (COREG) 25 MG tablet TAKE 1/2 TABLETS BY MOUTH 2 TIMES DAILY WITH A MEAL. 90 tablet 3  . chlorthalidone (HYGROTON) 25 MG tablet TAKE 1/2-1 TABLET BY MOUTH DAILY. 90 tablet 1  . donepezil (ARICEPT) 10 MG tablet Take 1 tablet (10 mg total) by mouth daily. 90 tablet 3  . famotidine (PEPCID) 20 MG tablet Take 20 mg by mouth daily.    . fexofenadine (ALLEGRA) 180  MG tablet Take 180 mg by mouth.    . fluticasone (FLONASE) 50 MCG/ACT nasal spray Place 2 sprays into both nostrils daily. 48 g 3  . levothyroxine (SYNTHROID) 50 MCG tablet TAKE 1 TABLET BY MOUTH EVERY DAY 90 tablet 3  . memantine (NAMENDA) 10 MG tablet TAKE 1 TABLET EVERY NIGHT FOR 2 WEEKS, THEN INCREASE TO 1 TABLET TWICE A DAY AND CONTINUE 180 tablet 1  . Multiple Vitamin (MULTIVITAMIN) tablet Take 1 tablet by mouth daily.    . potassium chloride SA (KLOR-CON M20) 20 MEQ tablet Take 2 tablets (40 mEq total) by mouth 2 (two) times daily. 360 tablet 3  . pravastatin (PRAVACHOL) 40 MG tablet TAKE 1 TABLET BY MOUTH EVERY DAY 90 tablet 3  . quinapril (ACCUPRIL) 40 MG tablet TAKE 1 TABLET BY MOUTH EVERY DAY 90 tablet 3  . NON FORMULARY CBD oil. 3 gtts qd prn (Patient not taking: Reported on 05/19/2020)     No current facility-administered medications for this visit.    Allergies-reviewed and updated Allergies  Allergen Reactions  . Aspirin Other (See Comments)    Stomach ulcers  . Nsaids Other (See Comments)    Stomach ulcers  . Amoxicillin-Pot Clavulanate Rash  . Sulfa Antibiotics Swelling and Rash    Social History   Social History Narrative   Married (wife patient outside practice). 2 children. 5 grandkids.       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       Hobbies: travel, enjoys going to shows, [employer] women's basketball tournament. Doesn't watch tv.             Lives in 1 story condo on the 3rd floor   Bachelor's degree   Right handed   Objective  Objective:  BP 137/77   Pulse (!) 58   Temp 97.8 F (36.6 C) (Temporal)   Ht 5\' 9"  (1.753 m)   Wt 209 lb 12.8 oz (95.2 kg)   SpO2 97%   BMI 30.98 kg/m  Gen: NAD, resting comfortably HEENT: Mucous membranes are moist. Oropharynx normal Neck: no thyromegaly CV: RRR no murmurs rubs or gallops Lungs: CTAB no crackles, wheeze, rhonchi Abdomen: soft/nontender/nondistended/normal bowel sounds. No rebound or guarding.  Ext:  no edema Skin: warm, dry Neuro: grossly normal, moves all extremities, PERRLA, walks with cane- uses this to get onto table  Diabetic Foot Exam - Simple   Simple Foot Form Diabetic Foot exam was performed with the following findings: Yes 05/19/2020 10:59 AM  Visual  Inspection No deformities, no ulcerations, no other skin breakdown bilaterally: Yes See comments: Yes Sensation Testing Intact to touch and monofilament testing bilaterally: Yes Pulse Check Posterior Tibialis and Dorsalis pulse intact bilaterally: Yes Comments Slight callous at base of 5th toe on right foot      Assessment and Plan  80 y.o. male presenting for annual physical.  Health Maintenance counseling: 1. Anticipatory guidance: Patient counseled regarding regular dental exams -q6 months, eye exams - sees tomorrow, yearly,  avoiding smoking and second hand smoke , limiting alcohol to 2 beverages per day - very rare which is ideal with dementia.   2. Risk factor reduction:  Advised patient of need for regular exercise and diet rich and fruits and vegetables to reduce risk of heart attack and stroke. Exercise- recommended starting- gave info on silver sneakers for [employer]. Diet- eats reasonably healthy-   Weight up 5 lbs from last physical- already portion sizes low per wife Wt Readings from Last 3 Encounters:  05/19/20 209 lb 12.8 oz (95.2 kg)  05/07/20 208 lb 3.2 oz (94.4 kg)  02/20/20 207 lb 9.6 oz (94.2 kg)  3. Immunizations/screenings/ancillary studies- discussed shingrix  Immunization History  Administered Date(s) Administered  . Fluad Quad(high Dose 65+) 01/07/2019, 01/08/2020  . Hepatitis B 06/19/2007, 11/16/2007, 04/18/2008  . Hepatitis B, ped/adol 06/19/2007, 11/16/2007, 04/18/2008  . Influenza Whole 01/27/2012  . Influenza, High Dose Seasonal PF 12/21/2015, 12/20/2016, 12/01/2017  . Influenza,inj,Quad PF,6+ Mos 02/24/2015  . Influenza-Unspecified 01/27/2012, 03/22/2013, 01/21/2014  . Meningococcal  Polysaccharide 12/19/2008  . PFIZER(Purple Top)SARS-COV-2 Vaccination 03/27/2019, 04/17/2019, 12/10/2019  . Pneumococcal Conjugate-13 08/25/2014  . Pneumococcal Polysaccharide-23 06/19/2007, 12/19/2008, 12/21/2015  . Td 04/23/2014  . Tdap 03/07/2001  . Zoster 08/12/2008   4. Prostate cancer screening- history of prostate cancer-follows closely with [facility] of [facility].  PSAs checked there  5. Colon cancer screening - Dr. Hilarie Fredrickson previously released him from further colonoscopy but due to blood in the stool and change in bowel habits we have referred him back 6. Skin cancer screening-follows with Dr. Elvera Lennox agrees with dermatology for history of skin cancer.advised regular sunscreen use. Denies worrisome, changing, or new skin lesions.  7.  Former smoker-occasional cigars in the past.  Has not had any in over 10 years.   8. STD screening -only active with wife  Status of chronic or acute concerns   #Dementia-patient enjoys Dr. Delice Lesch but did not have the best experience with our local office-he is considering transferring to Dr. Everette Rank of neurology (has not called yet).  Remains on Aricept and Namenda  #Diarrhea-patient with significant flareup of loose stools/diarrhea prior to last visit for a week.  Covid test was negative.  Did some blood work as well due to fatigue.  Unable to do stool cards as was not actively having diarrhea.  We check for blood in the stool which was positive so we opted to do a referral to GI-otherwise blood work and work-up was reassuring. Loose stools are stable- will give # to follow up with GI  #Gout S: 0 flares in last 6 months on Allopurinol 100Mg  Lab Results  Component Value Date   LABURIC 5.7 05/07/2020  A/P: Stable. Continue current medications.  Uric acid at goal   #hyperlipidemia/aortic atherosclerosis S: Medication: Pravastatin 40Mg  Lab Results  Component Value Date   CHOL 117 05/07/2020   HDL 47.70 05/07/2020   LDLCALC 40 05/07/2020    LDLDIRECT 90.0 09/28/2016   TRIG 144.0 05/07/2020   CHOLHDL 2 05/07/2020  A/P: Cholesterol at goal when checked within 2 weeks-continue current medication. Lipids at goal for aortic atherosclerosis as is BP  #hypothyroidism S: compliant On thyroid medication-Levothyroxine 50Mg   Lab Results  Component Value Date   TSH 3.58 05/07/2020  A/P: Stable. Continue current medications.      # Diabetes S: Medication: Diet controlled Lab Results  Component Value Date   HGBA1C 6.4 05/07/2020   HGBA1C 6.4 09/17/2019   HGBA1C 5.9 (A) 03/21/2019   A/P: Well-controlled-continue without medication for now -Sees ophthalmology tomorrow and records will be sent  # GERD S:Medication: Pepcid 20Mg  A/P: Reasonable control-continue current medicine  #hypertension S: medication: Quinapril 40Mg , Chlorthalidone 25Mg -sometimes just takes half, amlodipine 5Mg , carvedilol 12.5 mg twice daily.  Also takes potassium with chlorthalidone BP Readings from Last 3 Encounters:  05/19/20 137/77  05/07/20 116/70  02/20/20 120/68  A/P:  Stable. Continue current medications.    #Prolonged get up and go at 15 seconds-feels stable with cane and will continue that   Recommended follow up: Return in about 6 months (around 11/19/2020) for follow up- or sooner if needed. Future Appointments  Date Time Provider [place]  09/22/2020  8:30 AM Cameron Sprang, MD [facility] None  02/25/2021  9:30 AM [facility] HEALTH COACH [facility]   Lab/Order associations: NOT fasting but no labs today   ICD-10-CM   1. Preventative health care  Z00.00   2. Gastroesophageal reflux disease without esophagitis  K21.9   3. Type 2 diabetes mellitus with stage 3a chronic kidney disease, without long-term current use of insulin (HCC)  E11.22    N18.31   4. Hypothyroidism, unspecified type  E03.9   5. Hyperlipidemia, unspecified hyperlipidemia type  E78.5   6. Idiopathic chronic gout of left foot without tophus  M1A.0720   7. Mild  dementia ([place])  F03.90     No orders of the defined types were placed in this encounter.   Return precautions advised.  Garret Reddish, MD

## 2020-05-19 ENCOUNTER — Encounter: Payer: Self-pay | Admitting: Family Medicine

## 2020-05-19 ENCOUNTER — Ambulatory Visit (INDEPENDENT_AMBULATORY_CARE_PROVIDER_SITE_OTHER): Payer: Medicare HMO | Admitting: Family Medicine

## 2020-05-19 ENCOUNTER — Other Ambulatory Visit: Payer: Self-pay

## 2020-05-19 ENCOUNTER — Encounter: Payer: Self-pay | Admitting: Nurse Practitioner

## 2020-05-19 VITALS — BP 137/77 | HR 58 | Temp 97.8°F | Ht 69.0 in | Wt 209.8 lb

## 2020-05-19 DIAGNOSIS — R69 Illness, unspecified: Secondary | ICD-10-CM | POA: Diagnosis not present

## 2020-05-19 DIAGNOSIS — E1122 Type 2 diabetes mellitus with diabetic chronic kidney disease: Secondary | ICD-10-CM | POA: Diagnosis not present

## 2020-05-19 DIAGNOSIS — F03A0 Unspecified dementia, mild, without behavioral disturbance, psychotic disturbance, mood disturbance, and anxiety: Secondary | ICD-10-CM

## 2020-05-19 DIAGNOSIS — Z0000 Encounter for general adult medical examination without abnormal findings: Secondary | ICD-10-CM | POA: Diagnosis not present

## 2020-05-19 DIAGNOSIS — K219 Gastro-esophageal reflux disease without esophagitis: Secondary | ICD-10-CM | POA: Diagnosis not present

## 2020-05-19 DIAGNOSIS — E039 Hypothyroidism, unspecified: Secondary | ICD-10-CM

## 2020-05-19 DIAGNOSIS — E785 Hyperlipidemia, unspecified: Secondary | ICD-10-CM | POA: Diagnosis not present

## 2020-05-19 DIAGNOSIS — N1831 Chronic kidney disease, stage 3a: Secondary | ICD-10-CM

## 2020-05-19 DIAGNOSIS — M1A0720 Idiopathic chronic gout, left ankle and foot, without tophus (tophi): Secondary | ICD-10-CM

## 2020-05-19 DIAGNOSIS — F0390 Unspecified dementia without behavioral disturbance: Secondary | ICD-10-CM

## 2020-05-19 DIAGNOSIS — I700 Atherosclerosis of aorta: Secondary | ICD-10-CM | POA: Diagnosis not present

## 2020-05-20 ENCOUNTER — Encounter: Payer: Self-pay | Admitting: Family Medicine

## 2020-05-20 DIAGNOSIS — H5212 Myopia, left eye: Secondary | ICD-10-CM | POA: Diagnosis not present

## 2020-05-20 LAB — HM DIABETES EYE EXAM

## 2020-06-05 ENCOUNTER — Ambulatory Visit: Payer: Medicare HMO | Admitting: Nurse Practitioner

## 2020-06-05 DIAGNOSIS — Z8546 Personal history of malignant neoplasm of prostate: Secondary | ICD-10-CM | POA: Diagnosis not present

## 2020-06-12 DIAGNOSIS — Z8546 Personal history of malignant neoplasm of prostate: Secondary | ICD-10-CM | POA: Diagnosis not present

## 2020-06-14 NOTE — Progress Notes (Signed)
Assessment/Plan:   Mild dementia likely due to Alzheimer's disease This is a very pleasant 80 year old right-handed man, with vascular risk factors, including hypertension, hyperlipidemia, diabetes, with mild dementia likely due to Alzheimer's disease.  Due to continued progression, and SLUMS score of 16/ 30 in the prior visit, memantine was added on 07/2019 to donepezil 10 mg daily.  He has been tolerating well the regimen.  Continue close supervision . Discussed the diagnosis of dementia, likely due to Alzheimer's disease. . Discussed safety both in and out of the home. Discussed the importance of regular daily schedule with inclusion of crossword puzzles to maintain brain function.  . Continue to stay active exercising  at least 30 minutes at least 3 times a week.  . Naps should be scheduled and should be no longer than 60 minutes and should not occur after 2 PM.  . Mediterranean diet is recommended, information was provided in AVS . Follow-up in 6 months with MMSE.  The patient and his wife know to call if they have any questions or concerns in the interim. Marland Kitchen Refill of donepezil 10 mg daily and memantine 10 mg twice daily was written x3 .  Case discussed with Dr. Delice Lesch who agrees with the plan    Subjective:   Eddie Ferguson. Mcgaha was seen today in follow up for memory loss.  This patient is accompanied in the office by his wife Eddie Ferguson who supplements the history.  My previous records as well as any outside records available were reviewed prior to todays visit.  He was last seen at our office on 07/12/2019.  SLUMS score was 16/30 in October 2020, [place] in March 2020, 23/30 in July 2019. Patient is currently on donepezil 10 mg daily. Namenda was added on 07/12/19, and he is tolerating it well. Patient states that his memory has good days and bad days, but according to wife essentially unchanged from his prior visit.  He gets frustrated at times when his forgetfulness is more obvious such as  unable to recall things that he should remember, where he placed objects.  He remains active in his computer, in fact he brought the very complete chart that he made himself, with all the list of medications, and office appointments, which was quite impressive.  He also does crossword puzzles, and tries to read.  He does not drive, his wife takes him to the appointments.  He continues to be very organized and is in charge of his finances, denies missing any bill payments or over pain then.  His wife agrees. He denies any hallucinations or paranoia, and denies any vivid dreams.  His mood is more stable since taking antidepressant.  He sleeps well, about 7 to 9 hours, and is rested.  He did rarely takes naps.  He dresses by himself, and showers daily.  He denies any headaches, dizziness, vision changes or falls.  He denies any unilateral weakness. He has chronic neuropathy, with bilateral feet numbness which is not new, likely due to diabetes followed by his PCP. 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  His appetite is normal, denies any trouble swallowing, nausea or vomiting.  He denies any constipation or diarrhea. He denies any incontinence.      History on Initial Assessment 03/27/2017: This is a very pleasant 80 year old right-handed man with a history of hypertension, hyperlipidemia, diabetes, hypothyroidism, presenting for evaluation of short-term memory loss. He and his  wife report changes started around a year ago. He would not be able to recall where he ate the day prior. His wife reports he repeats himself and asks the same questions several times. He used to be accurate and precise, and has a very detailed spreadsheet of his medications (has similar one for bills), but now has difficulty using his computer. He is better in the morning, but later in the afternoon, he cannot recall how he set up a worksheet or where it is in his files. He makes  wrong turns while driving, but is able to get back on track easily. He occasionally misplaces things at home. His wife denies any personality changes, but he gets frustrated with himself because he is usually so precise. He has problems with multitasking. No paranoia or hallucinations. He is independent with ADLs. MMSE at his PCP office in November 2018 was 28/30. 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 His paternal grandmother had dementia. No history of significant head injuries. He drinks beer on occasion.   He has numbness in both feet. He has a left knee brace from a torn tendon. He denies any headaches, dizziness, diplopia, dysarthria/dypshagia, neck/back pain, focal numbness/tingling/weakness, bowel/bladder dysfunction, anosmia, or tremors.  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     PREVIOUS MEDICATIONS: none  CURRENT MEDICATIONS:  Outpatient Encounter Medications as of 06/15/2020  Medication Sig  . acetaminophen (TYLENOL) 500 MG tablet Take 500 mg by mouth every 6 (six) hours as needed. Reported on 06/16/2015  . allopurinol (ZYLOPRIM) 100 MG tablet Take 1 tablet (100 mg total) by mouth 2 (two) times daily.  Marland Kitchen amLODipine (NORVASC) 5 MG tablet TAKE 1 TABLET BY MOUTH EVERY DAY  . carvedilol (COREG) 25 MG tablet TAKE 1/2 TABLETS BY MOUTH 2 TIMES DAILY WITH A MEAL.  . chlorthalidone (HYGROTON) 25 MG tablet TAKE 1/2-1 TABLET BY MOUTH DAILY.  Marland Kitchen donepezil (ARICEPT) 10 MG tablet Take 1 tablet (10 mg total) by mouth daily.  . famotidine (PEPCID) 20 MG tablet Take 20 mg by mouth daily.  . fexofenadine (ALLEGRA) 180 MG tablet Take 180 mg by mouth.  . fluticasone (FLONASE) 50 MCG/ACT nasal spray Place 2 sprays into both nostrils  daily.  Marland Kitchen levothyroxine (SYNTHROID) 50 MCG tablet TAKE 1 TABLET BY MOUTH EVERY DAY  . memantine (NAMENDA) 10 MG tablet TAKE 1 TABLET EVERY NIGHT FOR 2 WEEKS, THEN INCREASE TO 1 TABLET TWICE A DAY AND CONTINUE  . Multiple Vitamin (MULTIVITAMIN) tablet Take 1 tablet by mouth daily.  . potassium chloride SA (KLOR-CON M20) 20 MEQ tablet Take 2 tablets (40 mEq total) by mouth 2 (two) times daily.  . pravastatin (PRAVACHOL) 40 MG tablet TAKE 1 TABLET BY MOUTH EVERY DAY  . quinapril (ACCUPRIL) 40 MG tablet TAKE 1 TABLET BY MOUTH EVERY DAY  . NON FORMULARY CBD oil. 3 gtts qd prn (Patient not taking: No sig reported)   No facility-administered encounter medications on file as of 06/15/2020.     Objective:     PHYSICAL EXAMINATION:    VITALS:   Vitals:   06/15/20 1002  BP: 132/76  Pulse: 69  Resp: 20  SpO2: 95%  Weight: 208 lb (94.3 kg)  Height: 5\' 10"  (1.778 m)    GEN:  The  patient appears stated age and is in NAD. HEENT:  Normocephalic, atraumatic.   Neurological examination:  Orientation: The patient is alert. Oriented to person, place and date Cranial nerves: There is good facial symmetry.The speech is fluent and clear.  Hearing is intact to conversational tone. Sensation: Sensation is intact to light touch throughout Motor: Strength is at least antigravity x4.  Movement examination: Tone: There is normal tone in the UE/LE Abnormal movements:  no tremor.  No myoclonus.  No asterixis.   Coordination:  There is no decremation with RAM's. Gait and Station: The patient has no difficulty arising out of a deep-seated chair without the use of the hands. The patient's stride length is good.    Montreal Cognitive Assessment  05/10/2018 09/26/2017 03/27/2017  Visuospatial/ Executive (0/5) 2 4 4   Naming (0/3) 3 2 3   Attention: Read list of digits (0/2) 2 2 2   Attention: Read list of letters (0/1) 1 1 1   Attention: Serial 7 subtraction starting at 100 (0/3) 3 3 3   Language: Repeat phrase  (0/2) 2 2 2   Language : Fluency (0/1) 1 1 1   Abstraction (0/2) 2 2 2   Delayed Recall (0/5) 0 0 0  Orientation (0/6) 6 6 6   Total 22 23 24    MMSE - Mini Mental State Exam 06/08/2017 04/15/2016  Not completed: Refused -  Orientation to time - 5  Orientation to Place - 5  Registration - 3  Attention/ Calculation - 5  Recall - 1  Language- name 2 objects - 2  Language- repeat - 1  Language- follow 3 step command - 3  Language- read & follow direction - 1  Write a sentence - 1  Copy design - 1  Total score - 28     CBC    Component Value Date/Time   WBC 8.0 05/07/2020 1239   RBC 4.58 05/07/2020 1239   HGB 14.6 05/07/2020 1239   HGB 14.3 10/07/2011 1046   HCT 42.4 05/07/2020 1239   HCT 41.8 10/07/2011 1046   PLT 167.0 05/07/2020 1239   PLT 202 10/07/2011 1046   MCV 92.5 05/07/2020 1239   MCV 89.5 10/07/2011 1046   MCH 30.9 12/22/2017 1615   MCHC 34.6 05/07/2020 1239   RDW 14.4 05/07/2020 1239   RDW 13.8 10/07/2011 1046   LYMPHSABS 1.3 05/07/2020 1239   LYMPHSABS 1.1 10/07/2011 1046   MONOABS 0.7 05/07/2020 1239   MONOABS 0.5 10/07/2011 1046   EOSABS 0.3 05/07/2020 1239   EOSABS 0.2 10/07/2011 1046   BASOSABS 0.1 05/07/2020 1239   BASOSABS 0.0 10/07/2011 1046     CMP Latest Ref Rng & Units 05/07/2020 09/17/2019 03/21/2019  Glucose 70 - 99 mg/dL 112(H) 105(H) 126(H)  BUN 6 - 23 mg/dL 15 17 14   Creatinine 0.40 - 1.50 mg/dL 1.24 1.29 1.29  Sodium 135 - 145 mEq/L 140 140 140  Potassium 3.5 - 5.1 mEq/L 3.6 3.5 3.2(L)  Chloride 96 - 112 mEq/L 102 99 98  CO2 19 - 32 mEq/L 31 35(H) 35(H)  Calcium 8.4 - 10.5 mg/dL 9.4 9.6 9.7  Total Protein 6.0 - 8.3 g/dL 5.8(L) 5.9(L) 6.2  Total Bilirubin 0.2 - 1.2 mg/dL 0.6 0.7 0.9  Alkaline Phos 39 - 117 U/L 63 62 67  AST 0 - 37 U/L 18 15 19   ALT 0 - 53 U/L 16 14 16        Total time spent on today's visit was 40 minutes, including both face-to-face time and nonface-to-face time.  Time  included that spent on review of records (prior  notes available to me/labs/imaging if pertinent), discussing treatment and goals, answering patient's questions and coordinating care.  Cc:  Marin Olp, MD Sharene Butters, PA-C

## 2020-06-15 ENCOUNTER — Other Ambulatory Visit: Payer: Self-pay

## 2020-06-15 ENCOUNTER — Ambulatory Visit (INDEPENDENT_AMBULATORY_CARE_PROVIDER_SITE_OTHER): Payer: Medicare HMO | Admitting: Physician Assistant

## 2020-06-15 ENCOUNTER — Encounter: Payer: Self-pay | Admitting: Physician Assistant

## 2020-06-15 DIAGNOSIS — F0390 Unspecified dementia without behavioral disturbance: Secondary | ICD-10-CM | POA: Diagnosis not present

## 2020-06-15 DIAGNOSIS — R69 Illness, unspecified: Secondary | ICD-10-CM | POA: Diagnosis not present

## 2020-06-15 DIAGNOSIS — F03A0 Unspecified dementia, mild, without behavioral disturbance, psychotic disturbance, mood disturbance, and anxiety: Secondary | ICD-10-CM

## 2020-06-15 MED ORDER — MEMANTINE HCL 10 MG PO TABS: ORAL_TABLET | ORAL | 3 refills | Status: DC

## 2020-06-15 MED ORDER — DONEPEZIL HCL 10 MG PO TABS: 10.0000 mg | ORAL_TABLET | Freq: Every day | ORAL | 3 refills | Status: DC

## 2020-06-15 NOTE — Patient Instructions (Addendum)
It was a pleasure to see you today at our office.   Recommendations:  Meds: Follow up in 6  Months Refill for Aricept 10 mg daily and Namenda 10 mg twice daily were written   RECOMMENDATIONS FOR ALL PATIENTS WITH MEMORY PROBLEMS: 1. Continue to exercise (Recommend 30 minutes of walking everyday, or 3 hours every week) 2. Increase social interactions - continue going to [place] and enjoy social gatherings with friends and family 3. Eat healthy, avoid fried foods and eat more fruits and vegetables 4. Maintain adequate blood pressure, blood sugar, and blood cholesterol level. Reducing the risk of stroke and cardiovascular disease also helps promoting better memory. 5. Avoid stressful situations. Live a simple life and avoid aggravations. Organize your time and prepare for the next day in anticipation. 6. Sleep well, avoid any interruptions of sleep and avoid any distractions in the bedroom that may interfere with adequate sleep quality 7. Avoid sugar, avoid sweets as there is a strong link between excessive sugar intake, diabetes, and cognitive impairment We discussed the Mediterranean diet, which has been shown to help patients reduce the risk of progressive memory disorders and reduces cardiovascular risk. This includes eating fish, eat fruits and green leafy vegetables, nuts like almonds and hazelnuts, walnuts, and also use olive oil. Avoid fast foods and fried foods as much as possible. Avoid sweets and sugar as sugar use has been linked to worsening of memory function.  There is always a concern of gradual progression of memory problems. If this is the case, then we may need to adjust level of care according to patient needs. Support, both to the patient and caregiver, should then be put into place.      FALL PRECAUTIONS: Be cautious when walking. Scan the area for obstacles that may increase the risk of trips and falls. When getting up in the mornings, sit up at the edge of the bed for a  few minutes before getting out of bed. Consider elevating the bed at the head end to avoid drop of blood pressure when getting up. Walk always in a well-lit room (use night lights in the walls). Avoid area rugs or power cords from appliances in the middle of the walkways. Use a walker or a cane if necessary and consider physical therapy for balance exercise. Get your eyesight checked regularly.     Mediterranean Diet A Mediterranean diet refers to food and lifestyle choices that are based on the traditions of countries located on the [employer]. This way of eating has been shown to help prevent certain conditions and improve outcomes for people who have chronic diseases, like kidney disease and heart disease. What are tips for following this plan? Lifestyle   Cook and eat meals together with your family, when possible.  Drink enough fluid to keep your urine clear or pale yellow.  Be physically active every day. This includes:  Aerobic exercise like running or swimming.  Leisure activities like gardening, walking, or housework.  Get 7-8 hours of sleep each night.  If recommended by your health care provider, drink red wine in moderation. This means 1 glass a day for nonpregnant women and 2 glasses a day for men. A glass of wine equals 5 oz (150 mL). Reading food labels   Check the serving size of packaged foods. For foods such as rice and pasta, the serving size refers to the amount of cooked product, not dry.  Check the total fat in packaged foods. Avoid foods that have saturated  fat or trans fats.  Check the ingredients list for added sugars, such as corn syrup. Shopping   At the grocery store, buy most of your food from the areas near the walls of the store. This includes:  Fresh fruits and vegetables (produce).  Grains, beans, nuts, and seeds. Some of these may be available in unpackaged forms or large amounts (in bulk).  Fresh seafood.  Poultry and eggs.  Low-fat  dairy products.  Buy whole ingredients instead of prepackaged foods.  Buy fresh fruits and vegetables in-season from local farmers markets.  Buy frozen fruits and vegetables in resealable bags.  If you do not have access to quality fresh seafood, buy precooked frozen shrimp or canned fish, such as tuna, salmon, or sardines.  Buy small amounts of raw or cooked vegetables, salads, or olives from the deli or salad bar at your store.  Stock your pantry so you always have certain foods on hand, such as olive oil, canned tuna, canned tomatoes, rice, pasta, and beans. Cooking   Cook foods with extra-virgin olive oil instead of using butter or other vegetable oils.  Have meat as a side dish, and have vegetables or grains as your main dish. This means having meat in small portions or adding small amounts of meat to foods like pasta or stew.  Use beans or vegetables instead of meat in common dishes like chili or lasagna.  Experiment with different cooking methods. Try roasting or broiling vegetables instead of steaming or sauteing them.  Add frozen vegetables to soups, stews, pasta, or rice.  Add nuts or seeds for added healthy fat at each meal. You can add these to yogurt, salads, or vegetable dishes.  Marinate fish or vegetables using olive oil, lemon juice, garlic, and fresh herbs. Meal planning   Plan to eat 1 vegetarian meal one day each week. Try to work up to 2 vegetarian meals, if possible.  Eat seafood 2 or more times a week.  Have healthy snacks readily available, such as:  Vegetable sticks with hummus.  Greek yogurt.  Fruit and nut trail mix.  Eat balanced meals throughout the week. This includes:  Fruit: 2-3 servings a day  Vegetables: 4-5 servings a day  Low-fat dairy: 2 servings a day  Fish, poultry, or lean meat: 1 serving a day  Beans and legumes: 2 or more servings a week  Nuts and seeds: 1-2 servings a day  Whole grains: 6-8 servings a  day  Extra-virgin olive oil: 3-4 servings a day  Limit red meat and sweets to only a few servings a month What are my food choices?  Mediterranean diet  Recommended  Grains: Whole-grain pasta. Brown rice. Bulgar wheat. Polenta. Couscous. Whole-wheat bread. [place].  Vegetables: Artichokes. Beets. Broccoli. Cabbage. Carrots. Eggplant. Green beans. Chard. Kale. Spinach. Onions. Leeks. Peas. Squash. Tomatoes. Peppers. Radishes.  Fruits: Apples. Apricots. Avocado. Berries. Bananas. Cherries. Dates. Figs. Grapes. Lemons. Melon. Oranges. Peaches. Plums. Pomegranate.  Meats and other protein foods: Beans. Almonds. Sunflower seeds. Pine nuts. Peanuts. [place]. Salmon. Scallops. Shrimp. [place]. Tilapia. Clams. Oysters. Eggs.  Dairy: Low-fat milk. Cheese. Greek yogurt.  Beverages: Water. Red wine. Herbal tea.  Fats and oils: Extra virgin olive oil. Avocado oil. Grape seed oil.  Sweets and desserts: [place] yogurt with honey. Baked apples. Poached pears. Trail mix.  Seasoning and other foods: Basil. Cilantro. Coriander. Cumin. Mint. Parsley. Sage. Rosemary. Tarragon. Garlic. Oregano. Thyme. Pepper. Balsalmic vinegar. Tahini. Hummus. Tomato sauce. Olives. Mushrooms.  Limit these  Grains: Prepackaged  pasta or rice dishes. Prepackaged cereal with added sugar.  Vegetables: Deep fried potatoes (french fries).  Fruits: Fruit canned in syrup.  Meats and other protein foods: Beef. Pork. Lamb. Poultry with skin. Hot dogs. [place] Salines.  Dairy: Ice cream. Sour cream. Whole milk.  Beverages: Juice. Sugar-sweetened soft drinks. Beer. Liquor and spirits.  Fats and oils: Butter. Canola oil. Vegetable oil. Beef fat (tallow). Lard.  Sweets and desserts: Cookies. Cakes. Pies. Candy.  Seasoning and other foods: Mayonnaise. Premade sauces and marinades.  The items listed may not be a complete list. Talk with your dietitian about what dietary choices are right for you. Summary  The Mediterranean diet  includes both food and lifestyle choices.  Eat a variety of fresh fruits and vegetables, beans, nuts, seeds, and whole grains.  Limit the amount of red meat and sweets that you eat.  Talk with your health care provider about whether it is safe for you to drink red wine in moderation. This means 1 glass a day for nonpregnant women and 2 glasses a day for men. A glass of wine equals 5 oz (150 mL). This information is not intended to replace advice given to you by your health care provider. Make sure you discuss any questions you have with your health care provider. Document Released: 10/15/2015 Document Revised: 11/17/2015 Document Reviewed: 10/15/2015 Elsevier Interactive Patient Education  2017 [employer].

## 2020-06-17 ENCOUNTER — Encounter: Payer: Self-pay | Admitting: Gastroenterology

## 2020-06-17 ENCOUNTER — Ambulatory Visit: Payer: Medicare HMO | Admitting: Gastroenterology

## 2020-06-17 VITALS — BP 146/70 | HR 67 | Ht 70.0 in | Wt 210.0 lb

## 2020-06-17 DIAGNOSIS — R197 Diarrhea, unspecified: Secondary | ICD-10-CM | POA: Diagnosis not present

## 2020-06-17 DIAGNOSIS — R195 Other fecal abnormalities: Secondary | ICD-10-CM

## 2020-06-17 NOTE — Progress Notes (Signed)
06/17/2020 [name]. Reich [id number] 04/30/1940   HISTORY OF PRESENT ILLNESS: This is an 80 year old male who is a patient of Dr. Vena Rua.  His last colonoscopy was in February 2018 at which time he was found to have 4 polyps that were removed (tubular adenomas), diverticulosis, mild radiation proctitis, and internal hemorrhoids.  He is here today at the request of his PCP, Dr. Yong Channel, for evaluation regarding diarrhea and positive fecal immunochemistry study.  The patient does have dementia and has been on Aricept for quite some time.  Was started on Namenda less than a year ago.  He is also on allopurinol for gout.  They say that he has intermittent diarrhea, but it is not a regular occurrence.  He has started [employer] probiotic daily and they have noticed improvement with that.  In regards to the positive fecal immunochemistry study, he has no sign of overt GI bleeding.  His hemoglobin is normal at 14.6 g.  He does not want to have a colonoscopy.   Past Medical History:  Diagnosis Date  . Allergy   . Ankle injury 06/01/2001   no surgery- skin debridement   . Arthritis   . Basal cell carcinoma 05/28/2014   L nasal tip. Mohs Dr. Link Snuffer.    . Blood transfusion    2010 because of stomach ulcers  . Cataract    bilaterally removed  . Diabetes mellitus   . GERD (gastroesophageal reflux disease)   . Glaucoma   . Hyperlipidemia   . Hypertension   . Hypothyroidism   . Pneumonia 04/12/2006  . Prostate cancer ([place])   . Seasonal allergies   . Sinusitis    treated for bacterial infection at least once a year at [facility]  . Stomach ulcer 2010   bleeding ulcer result NSAIDS   Past Surgical History:  Procedure Laterality Date  . abdominl abcess     03-15-2011  . BACK SURGERY    . CATARACT EXTRACTION, BILATERAL     04-07-2014, 03-17-2014  . COLONOSCOPY     2012, 5 year repeat  . CYSTOURETHROSCOPY  11/11/1998  . ESOPHAGOGASTRODUODENOSCOPY  2010   [facility]  . LUMBAR LAMINECTOMY  2007    [place]  . MOHS SURGERY     left side of nose  . POLYPECTOMY    . PROSTATE BIOPSY     radiation 2016  . RECONSTRUCTION TENDON PULLEY W/ TENDON / FASCIAL GRAFT OF HAND / FINGER  1982   rt 3rd finger  . RECONSTRUCTION TENDON PULLEY W/ TENDON / FASCIAL GRAFT OF HAND / FINGER  1953   rt 5th finger  . UPPER GASTROINTESTINAL ENDOSCOPY      reports that he quit smoking about 19 years ago. He has never used smokeless tobacco. He reports current alcohol use. He reports that he does not use drugs. family history includes Brain cancer in his brother; Colon polyps in his father; Dementia in his paternal grandmother; Diabetes in his father and mother; Hyperlipidemia in his father. Allergies  Allergen Reactions  . Aspirin Other (See Comments)    Stomach ulcers  . Nsaids Other (See Comments)    Stomach ulcers  . Amoxicillin-Pot Clavulanate Rash  . Sulfa Antibiotics Swelling and Rash      Outpatient Encounter Medications as of 06/17/2020  Medication Sig  . acetaminophen (TYLENOL) 500 MG tablet Take 500 mg by mouth every 6 (six) hours as needed. Reported on 06/16/2015  . allopurinol (ZYLOPRIM) 100 MG tablet Take 1 tablet (100 mg total)  by mouth 2 (two) times daily.  Marland Kitchen amLODipine (NORVASC) 5 MG tablet TAKE 1 TABLET BY MOUTH EVERY DAY  . carvedilol (COREG) 25 MG tablet TAKE 1/2 TABLETS BY MOUTH 2 TIMES DAILY WITH A MEAL.  . chlorthalidone (HYGROTON) 25 MG tablet TAKE 1/2-1 TABLET BY MOUTH DAILY.  Marland Kitchen donepezil (ARICEPT) 10 MG tablet Take 1 tablet (10 mg total) by mouth daily.  . famotidine (PEPCID) 20 MG tablet Take 20 mg by mouth daily.  . fexofenadine (ALLEGRA) 180 MG tablet Take 180 mg by mouth.  . fluticasone (FLONASE) 50 MCG/ACT nasal spray Place 2 sprays into both nostrils daily.  Marland Kitchen levothyroxine (SYNTHROID) 50 MCG tablet TAKE 1 TABLET BY MOUTH EVERY DAY  . memantine (NAMENDA) 10 MG tablet Take 1 tablet twice a day  . Multiple Vitamin (MULTIVITAMIN) tablet Take 1 tablet by mouth daily.  .  potassium chloride SA (KLOR-CON M20) 20 MEQ tablet Take 2 tablets (40 mEq total) by mouth 2 (two) times daily.  . pravastatin (PRAVACHOL) 40 MG tablet TAKE 1 TABLET BY MOUTH EVERY DAY  . quinapril (ACCUPRIL) 40 MG tablet TAKE 1 TABLET BY MOUTH EVERY DAY  . [DISCONTINUED] NON FORMULARY CBD oil. 3 gtts qd prn   No facility-administered encounter medications on file as of 06/17/2020.    REVIEW OF SYSTEMS  : All other systems reviewed and negative except where noted in the History of Present Illness.   PHYSICAL EXAM: BP (!) 146/70   Pulse 67   Ht 5\' 10"  (1.778 m)   Wt 210 lb (95.3 kg)   BMI 30.13 kg/m  General: Well developed white male in no acute distress Head: Normocephalic and atraumatic Eyes:  Sclerae anicteric, conjunctiva pink. Ears: Normal auditory acuity Lungs: Clear throughout to auscultation; no W/R/R. Heart: Regular rate and rhythm; no M/R/G. Abdomen: Soft, non-distended.  BS present.  Non-tender. Musculoskeletal: Symmetrical with no gross deformities  Skin: No lesions on visible extremities Extremities: No edema  Neurological: Alert oriented x 4, grossly non-focal Psychological:  Alert and cooperative. Normal mood and affect  ASSESSMENT AND PLAN: *Positive fecal immunochemistry: Patient denies any overt rectal bleeding.  His hemoglobin is normal.  He does have history of colon polyps on last colonoscopy 4 years ago.  He has some intermittent diarrhea, which could certainly be medication related/side effects (Aricept, allopurinol, namenda), but both he and his wife note improvement after beginning [employer] probiotic daily.  He is 80 years old with dementia.  He does not want to proceed with colonoscopy and his wife is in agreement with that.  They both understand that we certainly do not know 100% what has caused his positive fecal immunochemistry study without preceding with colonoscopy, but we can speculate that it could have been related to hemorrhoids or radiation  proctitis.  They understand and decline colonoscopy for now.  We will continue to observe his clinical state.  They will call us back for any other issues.  Would recommend discontinuing fecal immunochemistry studies for screening in this patient.   CC:  Marin Olp, MD

## 2020-06-17 NOTE — Patient Instructions (Signed)
If you are age 82 or older, your body mass index should be between 23-30. Your Body mass index is 30.13 kg/m. If this is out of the aforementioned range listed, please consider follow up with your Primary Care Provider.  If you are age 4 or younger, your body mass index should be between 19-25. Your Body mass index is 30.13 kg/m. If this is out of the aformentioned range listed, please consider follow up with your Primary Care Provider.   Thank you for choosing me and [facility].  Alonza Bogus, PA-C

## 2020-06-21 ENCOUNTER — Other Ambulatory Visit: Payer: Self-pay | Admitting: Family Medicine

## 2020-06-23 NOTE — Progress Notes (Signed)
Addendum: Reviewed and agree with assessment and management plan. Jabrea Kallstrom M, MD  

## 2020-08-17 ENCOUNTER — Other Ambulatory Visit: Payer: Self-pay

## 2020-08-17 ENCOUNTER — Ambulatory Visit (INDEPENDENT_AMBULATORY_CARE_PROVIDER_SITE_OTHER): Payer: Medicare HMO | Admitting: Podiatry

## 2020-08-17 ENCOUNTER — Encounter: Payer: Self-pay | Admitting: Podiatry

## 2020-08-17 DIAGNOSIS — M79675 Pain in left toe(s): Secondary | ICD-10-CM

## 2020-08-17 DIAGNOSIS — Q828 Other specified congenital malformations of skin: Secondary | ICD-10-CM | POA: Insufficient documentation

## 2020-08-17 DIAGNOSIS — M79674 Pain in right toe(s): Secondary | ICD-10-CM

## 2020-08-17 DIAGNOSIS — B351 Tinea unguium: Secondary | ICD-10-CM

## 2020-08-17 DIAGNOSIS — C61 Malignant neoplasm of prostate: Secondary | ICD-10-CM | POA: Insufficient documentation

## 2020-08-17 NOTE — Progress Notes (Signed)
This patient returns to my office for at risk foot care.  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  This patient is unable to cut nails himself since the patient cannot reach his nails.These nails are painful walking and wearing shoes. He also has painful callus on the outside of his right foot.  This callus is painful walking. He presents to the office with his wife. This patient presents for at risk foot care today.  General Appearance  Alert, conversant and in no acute stress.  Vascular  Dorsalis pedis and posterior tibial  pulses are palpable  bilaterally.  Capillary return is within normal limits  bilaterally. Temperature is within normal limits  bilaterally.  Neurologic  Senn-Weinstein monofilament wire test within normal limits  bilaterally. Muscle power within normal limits bilaterally.  Nails Thick disfigured discolored nails with subungual debris  from hallux to fifth toes bilaterally. No evidence of bacterial infection or drainage bilaterally.  Orthopedic  No limitations of motion  feet .  No crepitus or effusions noted.  No bony pathology or digital deformities noted.  Skin  normotropic skin with no porokeratosis noted bilaterally.  No signs of infections or ulcers noted.   Callus sub 5th met right foot.  Onychomycosis  Pain in right toes  Pain in left toes  Callus right foot.  Consent was obtained for treatment procedures.   Mechanical debridement of nails 1-5  bilaterally performed with a nail nipper.  Filed with dremel without incident.  Debride callus with # 15 blade.     Return office visit    3 months                  Told patient to return for periodic foot care and evaluation due to potential at risk complications.   Gardiner Barefoot DPM

## 2020-08-22 ENCOUNTER — Other Ambulatory Visit: Payer: Self-pay | Admitting: Family Medicine

## 2020-09-22 ENCOUNTER — Ambulatory Visit: Payer: Medicare HMO | Admitting: Neurology

## 2020-11-17 ENCOUNTER — Other Ambulatory Visit: Payer: Self-pay

## 2020-11-17 ENCOUNTER — Ambulatory Visit: Payer: Medicare HMO | Admitting: Podiatry

## 2020-11-17 ENCOUNTER — Encounter: Payer: Self-pay | Admitting: Podiatry

## 2020-11-17 DIAGNOSIS — E1122 Type 2 diabetes mellitus with diabetic chronic kidney disease: Secondary | ICD-10-CM

## 2020-11-17 DIAGNOSIS — B351 Tinea unguium: Secondary | ICD-10-CM

## 2020-11-17 DIAGNOSIS — M79674 Pain in right toe(s): Secondary | ICD-10-CM | POA: Diagnosis not present

## 2020-11-17 DIAGNOSIS — M79675 Pain in left toe(s): Secondary | ICD-10-CM | POA: Diagnosis not present

## 2020-11-17 DIAGNOSIS — N1831 Chronic kidney disease, stage 3a: Secondary | ICD-10-CM

## 2020-11-17 DIAGNOSIS — N183 Chronic kidney disease, stage 3 unspecified: Secondary | ICD-10-CM | POA: Diagnosis not present

## 2020-11-17 NOTE — Progress Notes (Signed)
This patient returns to my office for at risk foot care.  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  This patient is unable to cut nails himself since the patient cannot reach his nails.These nails are painful walking and wearing shoes. He also has painful callus on both feet which are not painful. This callus is painful walking. He presents to the office with his wife. This patient presents for at risk foot care today.  General Appearance  Alert, conversant and in no acute stress.  Vascular  Dorsalis pedis and posterior tibial  pulses are palpable  bilaterally.  Capillary return is within normal limits  bilaterally. Temperature is within normal limits  bilaterally.  Neurologic  Senn-Weinstein monofilament wire test within normal limits  bilaterally. Muscle power within normal limits bilaterally.  Nails Thick disfigured discolored nails with subungual debris  from hallux to fifth toes bilaterally. No evidence of bacterial infection or drainage bilaterally.  Orthopedic  No limitations of motion  feet .  No crepitus or effusions noted.  No bony pathology or digital deformities noted.  Skin  normotropic skin with no porokeratosis noted bilaterally.  No signs of infections or ulcers noted.   Callus sub 5th met B/L  asymptomatic.  Onychomycosis  Pain in right toes  Pain in toes  Consent was obtained for treatment procedures.   Mechanical debridement of nails 1-5  bilaterally performed with a nail nipper.  Filed with dremel without incident.      Return office visit    3 months                  Told patient to return for periodic foot care and evaluation due to potential at risk complications.   Gardiner Barefoot DPM

## 2020-11-19 ENCOUNTER — Other Ambulatory Visit: Payer: Self-pay

## 2020-11-19 ENCOUNTER — Encounter: Payer: Self-pay | Admitting: Family Medicine

## 2020-11-19 ENCOUNTER — Ambulatory Visit (INDEPENDENT_AMBULATORY_CARE_PROVIDER_SITE_OTHER): Payer: Medicare HMO | Admitting: Family Medicine

## 2020-11-19 VITALS — BP 132/64 | HR 63 | Temp 98.2°F | Ht 69.0 in | Wt 209.2 lb

## 2020-11-19 DIAGNOSIS — F03A0 Unspecified dementia, mild, without behavioral disturbance, psychotic disturbance, mood disturbance, and anxiety: Secondary | ICD-10-CM

## 2020-11-19 DIAGNOSIS — E785 Hyperlipidemia, unspecified: Secondary | ICD-10-CM

## 2020-11-19 DIAGNOSIS — E039 Hypothyroidism, unspecified: Secondary | ICD-10-CM

## 2020-11-19 DIAGNOSIS — R69 Illness, unspecified: Secondary | ICD-10-CM | POA: Diagnosis not present

## 2020-11-19 DIAGNOSIS — E1122 Type 2 diabetes mellitus with diabetic chronic kidney disease: Secondary | ICD-10-CM

## 2020-11-19 DIAGNOSIS — F0390 Unspecified dementia without behavioral disturbance: Secondary | ICD-10-CM

## 2020-11-19 DIAGNOSIS — R252 Cramp and spasm: Secondary | ICD-10-CM | POA: Diagnosis not present

## 2020-11-19 DIAGNOSIS — N1831 Chronic kidney disease, stage 3a: Secondary | ICD-10-CM

## 2020-11-19 LAB — COMPREHENSIVE METABOLIC PANEL
ALT: 19 U/L (ref 0–53)
AST: 19 U/L (ref 0–37)
Albumin: 3.9 g/dL (ref 3.5–5.2)
Alkaline Phosphatase: 56 U/L (ref 39–117)
BUN: 16 mg/dL (ref 6–23)
CO2: 33 mEq/L — ABNORMAL HIGH (ref 19–32)
Calcium: 9.5 mg/dL (ref 8.4–10.5)
Chloride: 102 mEq/L (ref 96–112)
Creatinine, Ser: 1.24 mg/dL (ref 0.40–1.50)
GFR: 54.15 mL/min — ABNORMAL LOW (ref >60.00)
Glucose, Bld: 108 mg/dL — ABNORMAL HIGH (ref 70–99)
Potassium: 3.7 mEq/L (ref 3.5–5.1)
Sodium: 141 mEq/L (ref 135–145)
Total Bilirubin: 0.7 mg/dL (ref 0.2–1.2)
Total Protein: 6.1 g/dL (ref 6.0–8.3)

## 2020-11-19 LAB — TSH: TSH: 2.91 u[IU]/mL (ref 0.35–5.50)

## 2020-11-19 LAB — HEMOGLOBIN A1C: Hgb A1c MFr Bld: 6.5 % (ref 4.6–6.5)

## 2020-11-19 LAB — MAGNESIUM: Magnesium: 2.1 mg/dL (ref 1.5–2.5)

## 2020-11-19 NOTE — Progress Notes (Signed)
Phone [phone] In person visit   Subjective:   Eddie Ferguson is a 80 y.o. year old very pleasant male patient who presents for/with See problem oriented charting Chief Complaint  Patient presents with   Hypertension   Hypothyroidism   Hyperlipidemia   Diabetes   This visit occurred during the SARS-CoV-2 public health emergency.  Safety protocols were in place, including screening questions prior to the visit, additional usage of staff PPE, and extensive cleaning of exam room while observing appropriate contact time as indicated for disinfecting solutions.   Past Medical History-  Patient Active Problem List   Diagnosis Date Noted   Mild dementia ([place]) 06/16/2015    Priority: High   History of prostate cancer-follows with Dr. Alinda Money of [facility] 05/28/2014    Priority: High   Type 2 diabetes mellitus with diabetic chronic kidney disease ([place]) 04/23/2014    Priority: High   Aortic atherosclerosis ([place]) 01/05/2018    Priority: Medium   CKD (chronic kidney disease), stage III ([place]) 04/18/2017    Priority: Medium   Gout 07/09/2015    Priority: Medium   Essential hypertension 04/23/2014    Priority: Medium   Hyperlipidemia 04/23/2014    Priority: Medium   Hypothyroidism 04/23/2014    Priority: Medium   Adenomatous colon polyp 05/28/2014    Priority: Low   Osteoarthritis 05/28/2014    Priority: Low   Erectile dysfunction 05/28/2014    Priority: Low   Basal cell carcinoma 05/28/2014    Priority: Low   GERD (gastroesophageal reflux disease) 04/23/2014    Priority: Low   Allergic rhinitis 04/23/2014    Priority: Low   Glaucoma 04/23/2014    Priority: Low   History of nonmelanoma skin cancer 11/18/2012    Priority: Low   Prostate cancer ([place]) 08/17/2020   Pain due to onychomycosis of toenails of both feet 08/17/2020   Porokeratosis 08/17/2020   Positive FIT (fecal immunochemical test) 06/17/2020   Intermittent diarrhea 06/17/2020   History of shingles  04/24/2011    Medications- reviewed and updated Current Outpatient Medications  Medication Sig Dispense Refill   acetaminophen (TYLENOL) 500 MG tablet Take 500 mg by mouth every 6 (six) hours as needed. Reported on 06/16/2015     allopurinol (ZYLOPRIM) 100 MG tablet TAKE 1 TABLET BY MOUTH TWICE A DAY 180 tablet 3   amLODipine (NORVASC) 5 MG tablet TAKE 1 TABLET BY MOUTH EVERY DAY 90 tablet 1   carvedilol (COREG) 25 MG tablet TAKE 1/2 TABLETS BY MOUTH 2 TIMES DAILY WITH A MEAL. 90 tablet 3   chlorthalidone (HYGROTON) 25 MG tablet TAKE 1/2-1 TABLET BY MOUTH DAILY. 90 tablet 1   donepezil (ARICEPT) 10 MG tablet Take 1 tablet (10 mg total) by mouth daily. 90 tablet 3   famotidine (PEPCID) 20 MG tablet Take 20 mg by mouth daily.     fexofenadine (ALLEGRA) 180 MG tablet Take 180 mg by mouth.     fluticasone (FLONASE) 50 MCG/ACT nasal spray Place 2 sprays into both nostrils daily. 48 g 3   KLOR-CON M20 20 MEQ tablet TAKE 2 TABLETS (40 MEQ TOTAL) BY MOUTH 2 (TWO) TIMES DAILY. 360 tablet 3   levothyroxine (SYNTHROID) 50 MCG tablet TAKE 1 TABLET BY MOUTH EVERY DAY 90 tablet 3   memantine (NAMENDA) 10 MG tablet Take 1 tablet twice a day 180 tablet 3   Multiple Vitamin (MULTIVITAMIN) tablet Take 1 tablet by mouth daily.     pravastatin (PRAVACHOL) 40 MG tablet TAKE 1 TABLET  BY MOUTH EVERY DAY 90 tablet 3   quinapril (ACCUPRIL) 40 MG tablet TAKE 1 TABLET BY MOUTH EVERY DAY 90 tablet 3   SHINGRIX injection      No current facility-administered medications for this visit.     Objective:  BP 132/64   Pulse 63   Temp 98.2 F (36.8 C) (Temporal)   Ht '5\' 9"'$  (1.753 m)   Wt 209 lb 3.2 oz (94.9 kg)   SpO2 97%   BMI 30.89 kg/m  Gen: NAD, resting comfortably CV: RRR no murmurs rubs or gallops Lungs: CTAB no crackles, wheeze, rhonchi Ext: trace edema Skin: warm, dry     Assessment and Plan   #Dementia.  Last visit patient was considering transferring to Dr. Everette Rank of neurology.  He was on Aricept  and Namenda.  Today he reports some worsening gradually since last visit- agrees to follow up with neurology.   #Diarrhea-prior fecal occult positive stools-possibly related to hemorrhoids or radiation proctitis per GI.  They recommended discontinuing any fecal immunochemistry studies.  Patient with prior flareup of diarrhea-currently only intermittent issues- nothing like last time.    # Diabetes S: Medication: Diet controlled Lab Results  Component Value Date   HGBA1C 6.4 05/07/2020   HGBA1C 6.4 09/17/2019   HGBA1C 5.9 (A) 03/21/2019   A/P: hopefully stable- update a1c today. Continue without meds for now  #hypertension S: medication: Quinapril 40 mg, chlorthalidone25 mg, amlodipine 5 mg, carvedilol 12.5 mg twice daily.  Takes potassium with chlorthalidone- high dose Home readings #s: similar to readings here today BP Readings from Last 3 Encounters:  11/19/20 132/64  06/17/20 (!) 146/70  06/15/20 132/76  A/P: Controlled. Continue current medications.   - having some issues with cramping in hands and legs even on potassium- will check potassium and magnesium. Has been on high dose potassium 40 meq BID for several years.   #hyperlipidemia/aortic atherosclerosis S: Medication: Pravastatin 40 mg Lab Results  Component Value Date   CHOL 117 05/07/2020   HDL 47.70 05/07/2020   LDLCALC 40 05/07/2020   LDLDIRECT 90.0 09/28/2016   TRIG 144.0 05/07/2020   CHOLHDL 2 05/07/2020   A/P: Excellent control last check for aortic atherosclerosis-continue current medication   #hypothyroidism S: compliant On thyroid medication-levothyroxine daily 50 mcg  Lab Results  Component Value Date   TSH 3.58 05/07/2020   A/P:hopefully stable- update tsh today. Continue current meds for now   #Gout S: 0 flares in 6 months on allopurinol 100 mg Lab Results  Component Value Date   LABURIC 5.7 05/07/2020  A/P:Controlled. Continue current medications.    #history of prostate cancer- getting close to  being changed to annual checks- possibly next visit. Prior radiation. Psa will be checked with them  Recommended follow up: Return in about 6 months (around 05/19/2021) for physical or sooner if needed. Future Appointments  Date Time Provider [place]  12/16/2020 11:00 AM Rondel Jumbo, PA-C [facility] None  02/16/2021  1:45 PM Gardiner Barefoot, DPM [facility]  02/25/2021  9:30 AM [facility] HEALTH COACH [facility]    Lab/Order associations:   ICD-10-CM   1. Cramps, extremity  R25.2 Comprehensive metabolic panel    Magnesium    2. Type 2 diabetes mellitus with stage 3a chronic kidney disease, without long-term current use of insulin (HCC)  E11.22 Comprehensive metabolic panel   [id number] Hemoglobin A1c    Magnesium      No orders of the defined types were placed in this encounter.  Return precautions advised.  Garret Reddish, MD

## 2020-11-19 NOTE — Patient Instructions (Addendum)
Health Maintenance Due  Topic Date Due   INFLUENZA VACCINE - let us know when you get this 10/05/2020   COVID-19 Vaccine - get omicron booster 2 months out from last covid shot 10/16/2020   Please stop by lab before you go If you have mychart- we will send your results within 3 business days of [place] receiving them.  If you do not have mychart- we will call you about results within 5 business days of [place] receiving them.  *please also note that you will see labs on mychart as soon as they post. I will later go in and write notes on them- will say "notes from Dr. Yong Channel"   Recommended follow up: Return in about 6 months (around 05/19/2021) for physical or sooner if needed.

## 2020-11-20 ENCOUNTER — Other Ambulatory Visit: Payer: Self-pay | Admitting: Family Medicine

## 2020-12-16 ENCOUNTER — Other Ambulatory Visit: Payer: Self-pay

## 2020-12-16 ENCOUNTER — Ambulatory Visit: Payer: Medicare HMO | Admitting: Physician Assistant

## 2020-12-16 VITALS — BP 125/71 | HR 65 | Resp 18 | Ht 70.0 in | Wt 208.0 lb

## 2020-12-16 DIAGNOSIS — R69 Illness, unspecified: Secondary | ICD-10-CM | POA: Diagnosis not present

## 2020-12-16 DIAGNOSIS — F03B0 Unspecified dementia, moderate, without behavioral disturbance, psychotic disturbance, mood disturbance, and anxiety: Secondary | ICD-10-CM

## 2020-12-16 DIAGNOSIS — F03A0 Unspecified dementia, mild, without behavioral disturbance, psychotic disturbance, mood disturbance, and anxiety: Secondary | ICD-10-CM

## 2020-12-16 MED ORDER — MEMANTINE HCL 10 MG PO TABS: ORAL_TABLET | ORAL | 3 refills | Status: DC

## 2020-12-16 MED ORDER — DONEPEZIL HCL 10 MG PO TABS: 10.0000 mg | ORAL_TABLET | Freq: Every day | ORAL | 3 refills | Status: DC

## 2020-12-16 NOTE — Progress Notes (Signed)
.   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  Patient is on memantine 10 mg twice daily, and donepezil 10 mg daily tolerating well.  Exam is essentially unchanged from prior.  No mood changes have been noted.   Recommendations:  Discussed safety both in and out of the home.  Discussed the importance of regular daily schedule with inclusion of crossword puzzles to maintain brain function.  Continue to monitor mood by PCP Stay active at least 30 minutes at least 3 times a week.  Naps should be scheduled and should be no longer than 60 minutes and should not occur after 2 PM.  Mediterranean diet is recommended  Continue donepezil 10 mg daily Side effects were discussed Continue memantine 10 mg twice daily, side effects discussed. Follow up in 6 months.   Case discussed with Dr. Delice Lesch who agrees with the plan     Subjective:    Eddie Ferguson is a 80 y.o. male  right-handed man, with vascular risk factors, including hypertension, hyperlipidemia, diabetes, with mild dementia likely due to Alzheimer's disease.  Due to continued progression, and SLUMS score of 16/ 30 in the prior visit, memantine was added on 07/2019 to donepezil 10 mg daily.   He is seen today in follow up for memory loss. This patient is accompanied in the office by his wife who supplements the history.  Previous records as well as any outside records available were reviewed prior to todays visit.    He reports his memory being "fair".  He has more difficulty remembering stories, and his wife states that he has the same questions.  However, she states that he is sharper in the morning, and in the evening is worse at least 2 or 3 times a week.  He continues to live in an independent living community with his wife, but he does not like to "participate much in activities as I used to  ".  His mood is "even".  Denies depression or irritability.  He likes to continue playing at the computer, spends significant amount of time there, as well as playing solitaire.  He sleeps well, denies any sleepwalking, vivid dreams, paranoia or hallucinations. He denies leaving objects in unusual places.  He is independent of bathing and dressing, medications and finances.  His appetite is good, denies trouble swallowing.  He does not cook.  He ambulates with the right cane "this is my best friend ", and denies any falls or head trauma recently.  Of note, he tried physical therapy in the recent past, but he according to his wife "he cannot remember the exercises to do, so says he did not enjoy it as much and he does not want to do it anymore ".  He does not drive, his wife takes him to the appointments.  He has chronic numbness in both feet, has a left knee brace.  He denies any headaches, dizziness, diplopia, dysarthria, dysphagia, neck or back pain, focal numbness or tingling or weakness, bowel or bladder dysfunction, anosmia or tremors.     PREVIOUS MEDICATIONS:   CURRENT MEDICATIONS:  Outpatient Encounter Medications as of 12/16/2020  Medication Sig   acetaminophen (TYLENOL) 500 MG tablet Take 500 mg by mouth every 6 (six) hours as needed. Reported on 06/16/2015   allopurinol (ZYLOPRIM) 100 MG tablet TAKE 1 TABLET BY MOUTH TWICE A DAY  amLODipine (NORVASC) 5 MG tablet TAKE 1 TABLET BY MOUTH EVERY DAY   carvedilol (COREG) 25 MG tablet TAKE 1/2 TABLETS BY MOUTH 2 TIMES DAILY WITH A MEAL.   chlorthalidone (HYGROTON) 25 MG tablet TAKE 1/2-1 TABLET BY MOUTH DAILY.   donepezil (ARICEPT) 10 MG tablet Take 1 tablet (10 mg total) by mouth daily.   famotidine (PEPCID) 20 MG tablet Take 20 mg by mouth daily.   fexofenadine (ALLEGRA) 180 MG tablet Take 180 mg by mouth.   fluticasone (FLONASE) 50 MCG/ACT nasal spray Place 2 sprays into both nostrils daily.   KLOR-CON M20 20 MEQ tablet TAKE 2 TABLETS (40  MEQ TOTAL) BY MOUTH 2 (TWO) TIMES DAILY.   levothyroxine (SYNTHROID) 50 MCG tablet TAKE 1 TABLET BY MOUTH EVERY DAY   memantine (NAMENDA) 10 MG tablet Take 1 tablet twice a day   Multiple Vitamin (MULTIVITAMIN) tablet Take 1 tablet by mouth daily.   pravastatin (PRAVACHOL) 40 MG tablet TAKE 1 TABLET BY MOUTH EVERY DAY   quinapril (ACCUPRIL) 40 MG tablet TAKE 1 TABLET BY MOUTH EVERY DAY   SHINGRIX injection    [DISCONTINUED] donepezil (ARICEPT) 10 MG tablet Take 1 tablet (10 mg total) by mouth daily.   [DISCONTINUED] memantine (NAMENDA) 10 MG tablet Take 1 tablet twice a day   No facility-administered encounter medications on file as of 12/16/2020.     Objective:     PHYSICAL EXAMINATION:    VITALS:   Vitals:   12/16/20 1048  BP: 125/71  Pulse: 65  Resp: 18  SpO2: 100%  Weight: 208 lb (94.3 kg)  Height: 5\' 10"  (1.778 m)    GEN:  The patient appears stated age and is in NAD. HEENT:  Normocephalic, atraumatic.   Neurological examination:  General: NAD, well-groomed, appears stated age. Orientation: The patient is alert. Oriented to person, place and date Cranial nerves: There is good facial symmetry.The speech is fluent and clear. No aphasia or dysarthria. Fund of knowledge is appropriate. Recent and remote memory are impaired. Attention and concentration are normal.  Able to name objects and repeat phrases.  Hearing is intact to conversational tone.    Sensation: Sensation is intact to light touch throughout Motor: Strength is at least antigravity x4. Tremors: none  DTR's 2/4 in [place] Cognitive Assessment  12/16/2020 05/10/2018 09/26/2017 03/27/2017  Visuospatial/ Executive (0/5) 1 2 4 4   Naming (0/3) 2 3 2 3   Attention: Read list of digits (0/2) 2 2 2 2   Attention: Read list of letters (0/1) 1 1 1 1   Attention: Serial 7 subtraction starting at 100 (0/3) 3 3 3 3   Language: Repeat phrase (0/2) 0 2 2 2   Language : Fluency (0/1) 1 1 1 1   Abstraction (0/2) 0 2 2  2   Delayed Recall (0/5) 0 0 0 0  Orientation (0/6) 2 6 6 6   Total 12 22 23 24   Adjusted Score (based on education) 12 - - -   MMSE - Mini Mental State Exam 06/08/2017 04/15/2016  Not completed: Refused -  Orientation to time - 5  Orientation to Place - 5  Registration - 3  Attention/ Calculation - 5  Recall - 1  Language- name 2 objects - 2  Language- repeat - 1  Language- follow 3 step command - 3  Language- read & follow direction - 1  Write a sentence - 1  Copy design - 1  Total score - [place]  Exam 12/24/2018 07/12/2019  Weekday Correct 1 1  Current year 1 1  What state are we in? 1 1  Amount spent 1 1  Amount left 2 2  # of Animals 2 2  5  objects recall 0 0  Number series 2 2  Hour markers 2 0  Time correct 0 2  Placed X in triangle correctly 1 1  Largest Figure 1 1  Name of male 2 2  Date back to work 0 0  Type of work 0 0  State she lived in 0 0  Total score 16 16       Movement examination: Tone: There is normal tone in the UE/LE Abnormal movements:  no tremor.  No myoclonus.  No asterixis.   Coordination:  There is no decremation with RAM's. Normal finger to nose  Gait and Station: The patient has no difficulty arising out of a deep-seated chair without the use of the hands. The patient's stride length is good.  Gait is cautious and narrow.        Total time spent on today's visit was minutes, including both face-to-face time and nonface-to-face time. Time included that spent on review of records (prior notes available to me/labs/imaging if pertinent), discussing treatment and goals, answering patient's questions and coordinating care.  Cc:  Marin Olp, MD Sharene Butters, PA-C

## 2020-12-16 NOTE — Patient Instructions (Addendum)
It was a pleasure to see you today at our office.   Recommendations:  Meds: Follow up in 6  Months  Continue same meds Refill for Aricept 10 mg daily and Namenda 10 mg twice daily were written   RECOMMENDATIONS FOR ALL PATIENTS WITH MEMORY PROBLEMS: 1. Continue to exercise (Recommend 30 minutes of walking everyday, or 3 hours every week) 2. Increase social interactions - continue going to [place] and enjoy social gatherings with friends and family 3. Eat healthy, avoid fried foods and eat more fruits and vegetables 4. Maintain adequate blood pressure, blood sugar, and blood cholesterol level. Reducing the risk of stroke and cardiovascular disease also helps promoting better memory. 5. Avoid stressful situations. Live a simple life and avoid aggravations. Organize your time and prepare for the next day in anticipation. 6. Sleep well, avoid any interruptions of sleep and avoid any distractions in the bedroom that may interfere with adequate sleep quality 7. Avoid sugar, avoid sweets as there is a strong link between excessive sugar intake, diabetes, and cognitive impairment We discussed the Mediterranean diet, which has been shown to help patients reduce the risk of progressive memory disorders and reduces cardiovascular risk. This includes eating fish, eat fruits and green leafy vegetables, nuts like almonds and hazelnuts, walnuts, and also use olive oil. Avoid fast foods and fried foods as much as possible. Avoid sweets and sugar as sugar use has been linked to worsening of memory function.  There is always a concern of gradual progression of memory problems. If this is the case, then we may need to adjust level of care according to patient needs. Support, both to the patient and caregiver, should then be put into place.      FALL PRECAUTIONS: Be cautious when walking. Scan the area for obstacles that may increase the risk of trips and falls. When getting up in the mornings, sit up at the edge  of the bed for a few minutes before getting out of bed. Consider elevating the bed at the head end to avoid drop of blood pressure when getting up. Walk always in a well-lit room (use night lights in the walls). Avoid area rugs or power cords from appliances in the middle of the walkways. Use a walker or a cane if necessary and consider physical therapy for balance exercise. Get your eyesight checked regularly.     Mediterranean Diet A Mediterranean diet refers to food and lifestyle choices that are based on the traditions of countries located on the [employer]. This way of eating has been shown to help prevent certain conditions and improve outcomes for people who have chronic diseases, like kidney disease and heart disease. What are tips for following this plan? Lifestyle  Cook and eat meals together with your family, when possible. Drink enough fluid to keep your urine clear or pale yellow. Be physically active every day. This includes: Aerobic exercise like running or swimming. Leisure activities like gardening, walking, or housework. Get 7-8 hours of sleep each night. If recommended by your health care provider, drink red wine in moderation. This means 1 glass a day for nonpregnant women and 2 glasses a day for men. A glass of wine equals 5 oz (150 mL). Reading food labels  Check the serving size of packaged foods. For foods such as rice and pasta, the serving size refers to the amount of cooked product, not dry. Check the total fat in packaged foods. Avoid foods that have saturated fat or trans fats. Check  the ingredients list for added sugars, such as corn syrup. Shopping  At the grocery store, buy most of your food from the areas near the walls of the store. This includes: Fresh fruits and vegetables (produce). Grains, beans, nuts, and seeds. Some of these may be available in unpackaged forms or large amounts (in bulk). Fresh seafood. Poultry and eggs. Low-fat dairy  products. Buy whole ingredients instead of prepackaged foods. Buy fresh fruits and vegetables in-season from local farmers markets. Buy frozen fruits and vegetables in resealable bags. If you do not have access to quality fresh seafood, buy precooked frozen shrimp or canned fish, such as tuna, salmon, or sardines. Buy small amounts of raw or cooked vegetables, salads, or olives from the deli or salad bar at your store. Stock your pantry so you always have certain foods on hand, such as olive oil, canned tuna, canned tomatoes, rice, pasta, and beans. Cooking  Cook foods with extra-virgin olive oil instead of using butter or other vegetable oils. Have meat as a side dish, and have vegetables or grains as your main dish. This means having meat in small portions or adding small amounts of meat to foods like pasta or stew. Use beans or vegetables instead of meat in common dishes like chili or lasagna. Experiment with different cooking methods. Try roasting or broiling vegetables instead of steaming or sauteing them. Add frozen vegetables to soups, stews, pasta, or rice. Add nuts or seeds for added healthy fat at each meal. You can add these to yogurt, salads, or vegetable dishes. Marinate fish or vegetables using olive oil, lemon juice, garlic, and fresh herbs. Meal planning  Plan to eat 1 vegetarian meal one day each week. Try to work up to 2 vegetarian meals, if possible. Eat seafood 2 or more times a week. Have healthy snacks readily available, such as: Vegetable sticks with hummus. Greek yogurt. Fruit and nut trail mix. Eat balanced meals throughout the week. This includes: Fruit: 2-3 servings a day Vegetables: 4-5 servings a day Low-fat dairy: 2 servings a day Fish, poultry, or lean meat: 1 serving a day Beans and legumes: 2 or more servings a week Nuts and seeds: 1-2 servings a day Whole grains: 6-8 servings a day Extra-virgin olive oil: 3-4 servings a day Limit red meat and sweets  to only a few servings a month What are my food choices? Mediterranean diet Recommended Grains: Whole-grain pasta. Brown rice. Bulgar wheat. Polenta. Couscous. Whole-wheat bread. [place]. Vegetables: Artichokes. Beets. Broccoli. Cabbage. Carrots. Eggplant. Green beans. Chard. Kale. Spinach. Onions. Leeks. Peas. Squash. Tomatoes. Peppers. Radishes. Fruits: Apples. Apricots. Avocado. Berries. Bananas. Cherries. Dates. Figs. Grapes. Lemons. Melon. Oranges. Peaches. Plums. Pomegranate. Meats and other protein foods: Beans. Almonds. Sunflower seeds. Pine nuts. Peanuts. [place]. Salmon. Scallops. Shrimp. [place]. Tilapia. Clams. Oysters. Eggs. Dairy: Low-fat milk. Cheese. Greek yogurt. Beverages: Water. Red wine. Herbal tea. Fats and oils: Extra virgin olive oil. Avocado oil. Grape seed oil. Sweets and desserts: [place] yogurt with honey. Baked apples. Poached pears. Trail mix. Seasoning and other foods: Basil. Cilantro. Coriander. Cumin. Mint. Parsley. Sage. Rosemary. Tarragon. Garlic. Oregano. Thyme. Pepper. Balsalmic vinegar. Tahini. Hummus. Tomato sauce. Olives. Mushrooms. Limit these Grains: Prepackaged pasta or rice dishes. Prepackaged cereal with added sugar. Vegetables: Deep fried potatoes (french fries). Fruits: Fruit canned in syrup. Meats and other protein foods: Beef. Pork. Lamb. Poultry with skin. Hot dogs. [place] Salines. Dairy: Ice cream. Sour cream. Whole milk. Beverages: Juice. Sugar-sweetened soft drinks. Beer. Liquor and spirits. Fats and oils: Butter.  Canola oil. Vegetable oil. Beef fat (tallow). Lard. Sweets and desserts: Cookies. Cakes. Pies. Candy. Seasoning and other foods: Mayonnaise. Premade sauces and marinades. The items listed may not be a complete list. Talk with your dietitian about what dietary choices are right for you. Summary The Mediterranean diet includes both food and lifestyle choices. Eat a variety of fresh fruits and vegetables, beans, nuts, seeds, and whole  grains. Limit the amount of red meat and sweets that you eat. Talk with your health care provider about whether it is safe for you to drink red wine in moderation. This means 1 glass a day for nonpregnant women and 2 glasses a day for men. A glass of wine equals 5 oz (150 mL). This information is not intended to replace advice given to you by your health care provider. Make sure you discuss any questions you have with your health care provider. Document Released: 10/15/2015 Document Revised: 11/17/2015 Document Reviewed: 10/15/2015 Elsevier Interactive Patient Education  2017 [employer].

## 2020-12-17 ENCOUNTER — Other Ambulatory Visit: Payer: Self-pay | Admitting: Family Medicine

## 2021-01-11 DIAGNOSIS — L814 Other melanin hyperpigmentation: Secondary | ICD-10-CM | POA: Diagnosis not present

## 2021-01-11 DIAGNOSIS — D225 Melanocytic nevi of trunk: Secondary | ICD-10-CM | POA: Diagnosis not present

## 2021-01-11 DIAGNOSIS — Z85828 Personal history of other malignant neoplasm of skin: Secondary | ICD-10-CM | POA: Diagnosis not present

## 2021-01-11 DIAGNOSIS — D235 Other benign neoplasm of skin of trunk: Secondary | ICD-10-CM | POA: Diagnosis not present

## 2021-01-11 DIAGNOSIS — L821 Other seborrheic keratosis: Secondary | ICD-10-CM | POA: Diagnosis not present

## 2021-01-11 DIAGNOSIS — D1801 Hemangioma of skin and subcutaneous tissue: Secondary | ICD-10-CM | POA: Diagnosis not present

## 2021-01-11 DIAGNOSIS — D485 Neoplasm of uncertain behavior of skin: Secondary | ICD-10-CM | POA: Diagnosis not present

## 2021-02-08 ENCOUNTER — Telehealth: Payer: Self-pay | Admitting: Family Medicine

## 2021-02-08 NOTE — Telephone Encounter (Signed)
Copied from [place] [id number]. Topic: Medicare AWV >> Feb 08, 2021 11:41 AM Harris-Coley, Hannah Beat wrote: Reason for CRM: LVM 02/08/21 @11 :41am on both numbers to r/s AWV appt 02/25/21 khc

## 2021-02-10 ENCOUNTER — Telehealth: Payer: Self-pay | Admitting: Physician Assistant

## 2021-02-10 NOTE — Telephone Encounter (Signed)
Pt's wife called back in stating she gave [place] the incorrect fax number previously. The correct fax number is [phone]

## 2021-02-10 NOTE — Telephone Encounter (Signed)
Wife called, said they need a note from [place] stating williams is competent to make decisions. They have paper work to get done regarding their estate. She wants a call to discuss.  Their fax is [phone] Att: Renard Hamper

## 2021-02-10 NOTE — Telephone Encounter (Signed)
You will have a dictate letter to her lawyer attn Justing Mirna Mires of incompetent to make decision. Fax number [phone] per DPR

## 2021-02-11 NOTE — Telephone Encounter (Signed)
[name] wife given details for DR. Anthoney Harada and number to [employer].

## 2021-02-12 NOTE — Telephone Encounter (Signed)
Patient would like a call back from christy about what they have been talking about

## 2021-02-12 NOTE — Telephone Encounter (Signed)
I spoke with patients wife, he will have to go for competency evaluation. Letter can not be written per Sharene Butters, PA-C

## 2021-02-12 NOTE — Telephone Encounter (Signed)
Lawyer just needs a note only to say patient is not able to manage finances, not an assessment per wife.Can you please dictate letter to do so. thanks

## 2021-02-16 ENCOUNTER — Ambulatory Visit: Payer: Medicare HMO | Admitting: Podiatry

## 2021-02-17 ENCOUNTER — Encounter: Payer: Self-pay | Admitting: Podiatry

## 2021-02-17 ENCOUNTER — Telehealth: Payer: Self-pay

## 2021-02-17 ENCOUNTER — Ambulatory Visit (INDEPENDENT_AMBULATORY_CARE_PROVIDER_SITE_OTHER): Payer: Medicare HMO | Admitting: Podiatry

## 2021-02-17 ENCOUNTER — Other Ambulatory Visit: Payer: Self-pay

## 2021-02-17 DIAGNOSIS — N183 Chronic kidney disease, stage 3 unspecified: Secondary | ICD-10-CM | POA: Diagnosis not present

## 2021-02-17 DIAGNOSIS — M79675 Pain in left toe(s): Secondary | ICD-10-CM | POA: Diagnosis not present

## 2021-02-17 DIAGNOSIS — M79674 Pain in right toe(s): Secondary | ICD-10-CM | POA: Diagnosis not present

## 2021-02-17 DIAGNOSIS — N1831 Chronic kidney disease, stage 3a: Secondary | ICD-10-CM

## 2021-02-17 DIAGNOSIS — Q828 Other specified congenital malformations of skin: Secondary | ICD-10-CM

## 2021-02-17 DIAGNOSIS — E1122 Type 2 diabetes mellitus with diabetic chronic kidney disease: Secondary | ICD-10-CM | POA: Diagnosis not present

## 2021-02-17 DIAGNOSIS — B351 Tinea unguium: Secondary | ICD-10-CM

## 2021-02-17 NOTE — Progress Notes (Signed)
This patient returns to my office for at risk foot care.  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  This patient is unable to cut nails himself since the patient cannot reach his nails.These nails are painful walking and wearing shoes. He also has painful callus on both feet which are not painful. This callus is painful walking.. This patient presents for at risk foot care today.  General Appearance  Alert, conversant and in no acute stress.  Vascular  Dorsalis pedis and posterior tibial  pulses are palpable  bilaterally.  Capillary return is within normal limits  bilaterally. Temperature is within normal limits  bilaterally.  Neurologic  Senn-Weinstein monofilament wire test within normal limits  bilaterally. Muscle power within normal limits bilaterally.  Nails Thick disfigured discolored nails with subungual debris  from hallux to fifth toes bilaterally. No evidence of bacterial infection or drainage bilaterally.  Orthopedic  No limitations of motion  feet .  No crepitus or effusions noted.  No bony pathology or digital deformities noted.  Skin  normotropic skin with no porokeratosis noted bilaterally.  No signs of infections or ulcers noted.   Callus sub 5th met B/L  asymptomatic.  Onychomycosis  Pain in right toes  Pain in toes  Consent was obtained for treatment procedures.   Mechanical debridement of nails 1-5  bilaterally performed with a nail nipper.  Filed with dremel without incident.   Hyperkeratotic tissue debrided without incident.    Return office visit    3 months                  Told patient to return for periodic foot care and evaluation due to potential at risk complications.   Lorenda Peck DPM

## 2021-02-17 NOTE — Telephone Encounter (Signed)
Pt's wife called wanting know if Dr Yong Channel can write a letter that Eddie Ferguson is physically and mentally stable. She stated that they are wanting make changes to [employer] Will. She stated that they are unable to make the changes until they get the letter from Dr Yong Channel. Please Advise.

## 2021-02-17 NOTE — Telephone Encounter (Signed)
Ok to write letter

## 2021-02-18 ENCOUNTER — Encounter: Payer: Self-pay | Admitting: Family Medicine

## 2021-02-18 NOTE — Telephone Encounter (Signed)
Yes thanks- may write letter and send  To whom it may concern,  Eddie Ferguson is mentally and physically stable.   Thanks, Garret Reddish, MD

## 2021-02-18 NOTE — Telephone Encounter (Signed)
Letter has been created and left up front for patient to pick up. Patients wife made aware.

## 2021-02-22 ENCOUNTER — Ambulatory Visit: Payer: Medicare HMO | Admitting: Podiatry

## 2021-02-23 ENCOUNTER — Ambulatory Visit (INDEPENDENT_AMBULATORY_CARE_PROVIDER_SITE_OTHER): Payer: Medicare HMO

## 2021-02-23 ENCOUNTER — Other Ambulatory Visit: Payer: Self-pay

## 2021-02-23 DIAGNOSIS — Z0000 Encounter for general adult medical examination without abnormal findings: Secondary | ICD-10-CM | POA: Diagnosis not present

## 2021-02-23 NOTE — Patient Instructions (Signed)
Eddie Ferguson , Thank you for taking time to come for your Medicare Wellness Visit. I appreciate your ongoing commitment to your health goals. Please review the following plan we discussed and let me know if I can assist you in the future.   Screening recommendations/referrals: Colonoscopy: No longer required  Recommended yearly ophthalmology/optometry visit for glaucoma screening and checkup Recommended yearly dental visit for hygiene and checkup  Vaccinations: Influenza vaccine: Done 12/31/20 Pneumococcal vaccine: Up to date Tdap vaccine: Done 04/23/14 repeat every 10 years  Shingles vaccine: Completed 3/15, 07/21/20   Covid-19: Completed 1/20, 2/10, 12/20/19 & 06/16/20, 12/01/20  Advanced directives: Please bring a copy of your health care power of attorney and living will to the office at your convenience.  Conditions/risks identified: Get up in the morning and go to bed at night stay healthy  Next appointment: Follow up in one year for your annual wellness visit.   Preventive Care 82 Years and Older, Male Preventive care refers to lifestyle choices and visits with your health care provider that can promote health and wellness. What does preventive care include? A yearly physical exam. This is also called an annual well check. Dental exams once or twice a year. Routine eye exams. Ask your health care provider how often you should have your eyes checked. Personal lifestyle choices, including: Daily care of your teeth and gums. Regular physical activity. Eating a healthy diet. Avoiding tobacco and drug use. Limiting alcohol use. Practicing safe sex. Taking low doses of aspirin every day. Taking vitamin and mineral supplements as recommended by your health care provider. What happens during an annual well check? The services and screenings done by your health care provider during your annual well check will depend on your age, overall health, lifestyle risk factors, and family history  of disease. Counseling  Your health care provider may ask you questions about your: Alcohol use. Tobacco use. Drug use. Emotional well-being. Home and relationship well-being. Sexual activity. Eating habits. History of falls. Memory and ability to understand (cognition). Work and work Statistician. Screening  You may have the following tests or measurements: Height, weight, and BMI. Blood pressure. Lipid and cholesterol levels. These may be checked every 5 years, or more frequently if you are over 11 years old. Skin check. Lung cancer screening. You may have this screening every year starting at age 42 if you have a 30-pack-year history of smoking and currently smoke or have quit within the past 15 years. Fecal occult blood test (FOBT) of the stool. You may have this test every year starting at age 29. Flexible sigmoidoscopy or colonoscopy. You may have a sigmoidoscopy every 5 years or a colonoscopy every 10 years starting at age 38. Prostate cancer screening. Recommendations will vary depending on your family history and other risks. Hepatitis C blood test. Hepatitis B blood test. Sexually transmitted disease (STD) testing. Diabetes screening. This is done by checking your blood sugar (glucose) after you have not eaten for a while (fasting). You may have this done every 1-3 years. Abdominal aortic aneurysm (AAA) screening. You may need this if you are a current or former smoker. Osteoporosis. You may be screened starting at age 31 if you are at high risk. Talk with your health care provider about your test results, treatment options, and if necessary, the need for more tests. Vaccines  Your health care provider may recommend certain vaccines, such as: Influenza vaccine. This is recommended every year. Tetanus, diphtheria, and acellular pertussis (Tdap, Td) vaccine. You may  need a Td booster every 10 years. Zoster vaccine. You may need this after age 80. Pneumococcal 13-valent  conjugate (PCV13) vaccine. One dose is recommended after age 59. Pneumococcal polysaccharide (PPSV23) vaccine. One dose is recommended after age 58. Talk to your health care provider about which screenings and vaccines you need and how often you need them. This information is not intended to replace advice given to you by your health care provider. Make sure you discuss any questions you have with your health care provider. Document Released: 03/20/2015 Document Revised: 11/11/2015 Document Reviewed: 12/23/2014 Elsevier Interactive Patient Education  2017 [employer] Prevention in the Home Falls can cause injuries. They can happen to people of all ages. There are many things you can do to make your home safe and to help prevent falls. What can I do on the outside of my home? Regularly fix the edges of walkways and driveways and fix any cracks. Remove anything that might make you trip as you walk through a door, such as a raised step or threshold. Trim any bushes or trees on the path to your home. Use bright outdoor lighting. Clear any walking paths of anything that might make someone trip, such as rocks or tools. Regularly check to see if handrails are loose or broken. Make sure that both sides of any steps have handrails. Any raised decks and porches should have guardrails on the edges. Have any leaves, snow, or ice cleared regularly. Use sand or salt on walking paths during winter. Clean up any spills in your garage right away. This includes oil or grease spills. What can I do in the bathroom? Use night lights. Install grab bars by the toilet and in the tub and shower. Do not use towel bars as grab bars. Use non-skid mats or decals in the tub or shower. If you need to sit down in the shower, use a plastic, non-slip stool. Keep the floor dry. Clean up any water that spills on the floor as soon as it happens. Remove soap buildup in the tub or shower regularly. Attach bath mats  securely with double-sided non-slip rug tape. Do not have throw rugs and other things on the floor that can make you trip. What can I do in the bedroom? Use night lights. Make sure that you have a light by your bed that is easy to reach. Do not use any sheets or blankets that are too big for your bed. They should not hang down onto the floor. Have a firm chair that has side arms. You can use this for support while you get dressed. Do not have throw rugs and other things on the floor that can make you trip. What can I do in the kitchen? Clean up any spills right away. Avoid walking on wet floors. Keep items that you use a lot in easy-to-reach places. If you need to reach something above you, use a strong step stool that has a grab bar. Keep electrical cords out of the way. Do not use floor polish or wax that makes floors slippery. If you must use wax, use non-skid floor wax. Do not have throw rugs and other things on the floor that can make you trip. What can I do with my stairs? Do not leave any items on the stairs. Make sure that there are handrails on both sides of the stairs and use them. Fix handrails that are broken or loose. Make sure that handrails are as long as the stairways.  Check any carpeting to make sure that it is firmly attached to the stairs. Fix any carpet that is loose or worn. Avoid having throw rugs at the top or bottom of the stairs. If you do have throw rugs, attach them to the floor with carpet tape. Make sure that you have a light switch at the top of the stairs and the bottom of the stairs. If you do not have them, ask someone to add them for you. What else can I do to help prevent falls? Wear shoes that: Do not have high heels. Have rubber bottoms. Are comfortable and fit you well. Are closed at the toe. Do not wear sandals. If you use a stepladder: Make sure that it is fully opened. Do not climb a closed stepladder. Make sure that both sides of the stepladder  are locked into place. Ask someone to hold it for you, if possible. Clearly mark and make sure that you can see: Any grab bars or handrails. First and last steps. Where the edge of each step is. Use tools that help you move around (mobility aids) if they are needed. These include: Canes. Walkers. Scooters. Crutches. Turn on the lights when you go into a dark area. Replace any light bulbs as soon as they burn out. Set up your furniture so you have a clear path. Avoid moving your furniture around. If any of your floors are uneven, fix them. If there are any pets around you, be aware of where they are. Review your medicines with your doctor. Some medicines can make you feel dizzy. This can increase your chance of falling. Ask your doctor what other things that you can do to help prevent falls. This information is not intended to replace advice given to you by your health care provider. Make sure you discuss any questions you have with your health care provider. Document Released: 12/18/2008 Document Revised: 07/30/2015 Document Reviewed: 03/28/2014 Elsevier Interactive Patient Education  2017 [employer].

## 2021-02-23 NOTE — Progress Notes (Signed)
Virtual Visit via Telephone Note  I connected with  Eddie Ferguson on 02/23/21 at 11:00 AM EST by telephone and verified that I am speaking with the correct person using two identifiers.  Medicare Annual Wellness visit completed telephonically due to Covid-19 pandemic.   Persons participating in this call: This Health Coach and this patient.   Location: Patient: home Provider: office   I discussed the limitations, risks, security and privacy concerns of performing an evaluation and management service by telephone and the availability of in person appointments. The patient expressed understanding and agreed to proceed.  Unable to perform video visit due to video visit attempted and failed and/or patient does not have video capability.   Some vital signs may be absent or patient reported.   Willette Brace, LPN   Subjective:   Eddie Ferguson is a 80 y.o. male who presents for Medicare Annual/Subsequent preventive examination.  Review of Systems     Cardiac Risk Factors include: advanced age (>83men, >43 women);hypertension;diabetes mellitus;dyslipidemia;male gender     Objective:    There were no vitals filed for this visit. There is no height or weight on file to calculate BMI.  Advanced Directives 02/23/2021 12/16/2020 06/15/2020 02/20/2020 10/17/2019 10/15/2019 07/12/2019  Does Patient Have a Medical Advance Directive? Yes Yes Yes Yes No No Yes  Type of Paramedic of [place];Living will - - [place];Living will - - [place];Living will  Does patient want to make changes to medical advance directive? - - - - - - -  Copy of [place] in Chart? No - copy requested - - No - copy requested - - -  Would patient like information on creating a medical advance directive? - - - - No - Patient declined No - Patient declined -    Current Medications (verified) Outpatient Encounter Medications as of  02/23/2021  Medication Sig   acetaminophen (TYLENOL) 500 MG tablet Take 500 mg by mouth every 6 (six) hours as needed. Reported on 06/16/2015   allopurinol (ZYLOPRIM) 100 MG tablet TAKE 1 TABLET BY MOUTH TWICE A DAY   amLODipine (NORVASC) 5 MG tablet TAKE 1 TABLET BY MOUTH EVERY DAY   carvedilol (COREG) 25 MG tablet TAKE 1/2 TABLETS BY MOUTH 2 TIMES DAILY WITH A MEAL.   chlorthalidone (HYGROTON) 25 MG tablet TAKE 1/2-1 TABLET BY MOUTH DAILY.   donepezil (ARICEPT) 10 MG tablet Take 1 tablet (10 mg total) by mouth daily.   famotidine (PEPCID) 20 MG tablet Take 20 mg by mouth daily.   fexofenadine (ALLEGRA) 180 MG tablet Take 180 mg by mouth.   fluticasone (FLONASE) 50 MCG/ACT nasal spray Place 2 sprays into both nostrils daily.   KLOR-CON M20 20 MEQ tablet TAKE 2 TABLETS (40 MEQ TOTAL) BY MOUTH 2 (TWO) TIMES DAILY.   levothyroxine (SYNTHROID) 50 MCG tablet TAKE 1 TABLET BY MOUTH EVERY DAY   memantine (NAMENDA) 10 MG tablet Take 1 tablet twice a day   Multiple Vitamin (MULTIVITAMIN) tablet Take 1 tablet by mouth daily.   pravastatin (PRAVACHOL) 40 MG tablet TAKE 1 TABLET BY MOUTH EVERY DAY   quinapril (ACCUPRIL) 40 MG tablet TAKE 1 TABLET BY MOUTH EVERY DAY   FLUZONE HIGH-DOSE QUADRIVALENT 0.7 ML SUSY    PFIZER COVID-19 VAC BIVALENT injection    SHINGRIX injection    No facility-administered encounter medications on file as of 02/23/2021.    Allergies (verified) Aspirin, Nsaids, Amoxicillin-pot clavulanate, and Sulfa antibiotics  History: Past Medical History:  Diagnosis Date   Allergy    Ankle injury 06/01/2001   no surgery- skin debridement    Arthritis    Basal cell carcinoma 05/28/2014   L nasal tip. Mohs Dr. Link Snuffer.     Blood transfusion    2010 because of stomach ulcers   Cataract    bilaterally removed   Diabetes mellitus    GERD (gastroesophageal reflux disease)    Glaucoma    Hyperlipidemia    Hypertension    Hypothyroidism    Pneumonia 04/12/2006   Prostate  cancer ([place])    Seasonal allergies    Sinusitis    treated for bacterial infection at least once a year at [facility]   Stomach ulcer 2010   bleeding ulcer result NSAIDS   Past Surgical History:  Procedure Laterality Date   abdominl abcess     03-15-2011   BACK SURGERY     CATARACT EXTRACTION, BILATERAL     04-07-2014, 03-17-2014   COLONOSCOPY     2012, 5 year repeat   CYSTOURETHROSCOPY  11/11/1998   ESOPHAGOGASTRODUODENOSCOPY  2010   PUD   LUMBAR LAMINECTOMY  2007   [place]   MOHS SURGERY     left side of nose   POLYPECTOMY     PROSTATE BIOPSY     radiation 2016   RECONSTRUCTION TENDON PULLEY W/ TENDON / FASCIAL GRAFT OF HAND / FINGER  1982   rt 3rd finger   RECONSTRUCTION TENDON PULLEY W/ TENDON / FASCIAL GRAFT OF HAND / FINGER  1953   rt 5th finger   UPPER GASTROINTESTINAL ENDOSCOPY     Family History  Problem Relation Age of Onset   Diabetes Mother    Diabetes Father    Hyperlipidemia Father    Colon polyps Father    Brain cancer Brother        smoker   Dementia Paternal Grandmother    Colon cancer Neg Hx    Esophageal cancer Neg Hx    Stomach cancer Neg Hx    Rectal cancer Neg Hx    Social History   Socioeconomic History   Marital status: Married    Spouse name: Not on file   Number of children: 2   Years of education: 4   Highest education level: Not on file  Occupational History   Occupation: retired  Tobacco Use   Smoking status: Former    Types: Cigarettes    Quit date: 02/03/2001    Years since quitting: 20.0   Smokeless tobacco: Never   Tobacco comments:    intermittent cigar use stopped  Vaping Use   Vaping Use: Never used  Substance and Sexual Activity   Alcohol use: Yes    Alcohol/week: 0.0 standard drinks    Comment: occasional beer   Drug use: No   Sexual activity: Not Currently    Partners: Female  Other Topics Concern   Not on file  Social History Narrative   Married (wife patient outside practice). 2 children. 5 grandkids.        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       Hobbies: travel, enjoys going to shows, [facility] women's basketball tournament. Doesn't watch tv.             Lives in 1 story condo on the 3rd floor   Bachelor's degree   Right handed   Social Determinants of Health   Financial Resource Strain: Not on file  Food Insecurity: Not on file  Transportation Needs: Not on file  Physical Activity: Inactive   Days of Exercise per Week: 0 days   Minutes of Exercise per Session: 0 min  Stress: No Stress Concern Present   Feeling of Stress : Not at all  Social Connections: Moderately Isolated   Frequency of Communication with Friends and Family: Twice a week   Frequency of Social Gatherings with Friends and Family: Three times a week   Attends Religious Services: Never   Active Member of Clubs or Organizations: No   Attends Archivist Meetings: Never   Marital Status: Married    Tobacco Counseling Counseling given: Not Answered Tobacco comments: intermittent cigar use stopped   Clinical Intake:  Pre-visit preparation completed: Yes  Pain : No/denies pain     BMI - recorded: 29.84 Nutritional Status: BMI 25 -29 Overweight Nutritional Risks: None Diabetes: Yes CBG done?: No Did pt. bring in CBG monitor from home?: No  How often do you need to have someone help you when you read instructions, pamphlets, or other written materials from your doctor or pharmacy?: 1 - Never  Diabetic?Nutrition Risk Assessment:  Has the patient had any N/V/D within the last 2 months?  No  Does the patient have any non-healing wounds?  No  Has the patient had any unintentional weight loss or weight gain?  No   Diabetes:  Is the patient diabetic?  Yes  If diabetic, was a CBG obtained today?  No  Did the patient bring in their glucometer from home?  No  How often do you monitor your CBG's? N/A.   Financial Strains and Diabetes Management:  Are you having any financial strains with the  device, your supplies or your medication? No .  Does the patient want to be seen by Chronic Care Management for management of their diabetes?  No  Would the patient like to be referred to a Nutritionist or for Diabetic Management?  No   Diabetic Exams:  Diabetic Eye Exam: Completed 05/20/20 Diabetic Foot Exam: Completed 05/19/20   Interpreter Needed?: No  Information entered by :: Charlott Rakes, LPN   Activities of Daily Living In your present state of health, do you have any difficulty performing the following activities: 02/23/2021  Hearing? N  Vision? N  Difficulty concentrating or making decisions? N  Walking or climbing stairs? Y  Comment careful  Dressing or bathing? N  Doing errands, shopping? N  Preparing Food and eating ? N  Using the Toilet? N  In the past six months, have you accidently leaked urine? N  Do you have problems with loss of bowel control? N  Managing your Medications? N  Managing your Finances? N  Housekeeping or managing your Housekeeping? N  Some recent data might be hidden    Patient Care Team: Marin Olp, MD as PCP - General (Family Medicine) Cameron Sprang, MD as Consulting Physician (Neurology) Gardiner Barefoot, DPM as Consulting Physician (Podiatry) Barbaraann Cao, OD as Consulting Physician (Optometry) Dermatology, [facility] as Consulting Physician Shawn Route, Nancy Marus (Neurology)  Indicate any recent Medical Services you may have received from other than [facility] providers in the past year (date may be approximate).     Assessment:   This is a routine wellness examination for [place].  Hearing/Vision screen Hearing Screening - Comments:: Pt denies any hearing issues Vision Screening - Comments:: Pt follows up with Dr Sabra Heck for annual eye exams   Dietary issues and exercise activities discussed: Current Exercise Habits: The patient  does not participate in regular exercise at present   Goals Addressed              This Visit's Progress    Patient Stated       Stay healthy get up every day and go to bed at night       Depression Screen PHQ 2/9 Scores 02/23/2021 05/19/2020 05/07/2020 02/20/2020 03/21/2019 01/15/2019 04/27/2018  PHQ - 2 Score 0 0 0 0 0 0 0  PHQ- 9 Score - - - - 0 - -    Fall Risk Fall Risk  02/23/2021 12/16/2020 06/15/2020 05/19/2020 05/07/2020  Falls in the past year? 0 0 0 0 0  Number falls in past yr: 0 0 0 - 0  Injury with Fall? 0 0 0 - 0  Risk for fall due to : Impaired balance/gait;Impaired vision - - - -  Risk for fall due to: Comment - - - - -  Follow up Falls prevention discussed - - - -    FALL RISK PREVENTION PERTAINING TO THE HOME:  Any stairs in or around the home? Yes  If so, are there any without handrails? No  Home free of loose throw rugs in walkways, pet beds, electrical cords, etc? Yes  Adequate lighting in your home to reduce risk of falls? Yes   ASSISTIVE DEVICES UTILIZED TO PREVENT FALLS:  Life alert? No  Use of a cane, walker or w/c? Yes  Grab bars in the bathroom? Yes  Shower chair or bench in shower? Yes  Elevated toilet seat or a handicapped toilet? No   TIMED UP AND GO:  Was the test performed? No .  Cognitive Function: MMSE - Mini Mental State Exam 06/08/2017 04/15/2016  Not completed: Refused -  Orientation to time - 5  Orientation to Place - 5  Registration - 3  Attention/ Calculation - 5  Recall - 1  Language- name 2 objects - 2  Language- repeat - 1  Language- follow 3 step command - 3  Language- read & follow direction - 1  Write a sentence - 1  Copy design - 1  Total score - 28   Montreal Cognitive Assessment  12/16/2020 05/10/2018 09/26/2017 03/27/2017  Visuospatial/ Executive (0/5) 1 2 4 4   Naming (0/3) 2 3 2 3   Attention: Read list of digits (0/2) 2 2 2 2   Attention: Read list of letters (0/1) 1 1 1 1   Attention: Serial 7 subtraction starting at 100 (0/3) 3 3 3 3   Language: Repeat phrase (0/2) 0 2 2 2   Language : Fluency (0/1) 1 1  1 1   Abstraction (0/2) 0 2 2 2   Delayed Recall (0/5) 0 0 0 0  Orientation (0/6) 2 6 6 6   Total 12 22 23 24   Adjusted Score (based on education) 12 - - -   6CIT Screen 02/23/2021 02/20/2020  What Year? 0 points -  What month? 0 points 0 points  What time? 0 points -  Count back from 20 0 points 0 points  Months in reverse 0 points 4 points  Repeat phrase 0 points 10 points  Total Score 0 -    Immunizations Immunization History  Administered Date(s) Administered   Fluad Quad(high Dose 65+) 01/07/2019, 01/08/2020   Hepatitis B 06/19/2007, 11/16/2007, 04/18/2008   Hepatitis B, ped/adol 06/19/2007, 11/16/2007, 04/18/2008   Influenza Whole 01/27/2012   Influenza, High Dose Seasonal PF 12/21/2015, 12/20/2016, 12/01/2017   Influenza,inj,Quad PF,6+ Mos 02/24/2015   Influenza-Unspecified 01/27/2012, 01/21/2014,  12/31/2020   Meningococcal Polysaccharide 12/19/2008   PFIZER Comirnaty(Gray Top)Covid-19 Tri-Sucrose Vaccine 06/16/2020   PFIZER(Purple Top)SARS-COV-2 Vaccination 03/27/2019, 04/17/2019, 12/10/2019   Pfizer Covid-19 Vaccine Bivalent Booster 31yrs & up 12/01/2020   Pneumococcal Conjugate-13 08/25/2014   Pneumococcal Polysaccharide-23 06/19/2007, 12/19/2008, 12/21/2015   Td 04/23/2014   Tdap 03/07/2001   Zoster Recombinat (Shingrix) 05/19/2020, 07/21/2020   Zoster, Live 08/12/2008    TDAP status: Up to date  Flu Vaccine status: Up to date  Pneumococcal vaccine status: Up to date  Covid-19 vaccine status: Completed vaccines  Qualifies for Shingles Vaccine? Yes   Zostavax completed Yes   Shingrix Completed?: Yes  Screening Tests Health Maintenance  Topic Date Due   FOOT EXAM  05/19/2021   HEMOGLOBIN A1C  05/19/2021   OPHTHALMOLOGY EXAM  05/20/2021   TETANUS/TDAP  04/23/2024   Pneumonia Vaccine 59+ Years old  Completed   INFLUENZA VACCINE  Completed   COVID-19 Vaccine  Completed   Zoster Vaccines- Shingrix  Completed   HPV VACCINES  Aged Out   COLONOSCOPY (Pts  45-68yrs Insurance coverage will need to be confirmed)  Discontinued    Health Maintenance  There are no preventive care reminders to display for this patient.  Colorectal cancer screening: No longer required.   Additional Screening:   Vision Screening: Recommended annual ophthalmology exams for early detection of glaucoma and other disorders of the eye. Is the patient up to date with their annual eye exam?  Yes  Who is the provider or what is the name of the office in which the patient attends annual eye exams? Dr Sabra Heck If pt is not established with a provider, would they like to be referred to a provider to establish care? No .   Dental Screening: Recommended annual dental exams for proper oral hygiene  Community Resource Referral / Chronic Care Management: CRR required this visit?  No   CCM required this visit?  No      Plan:     I have personally reviewed and noted the following in the patients chart:   Medical and social history Use of alcohol, tobacco or illicit drugs  Current medications and supplements including opioid prescriptions. Patient is not currently taking opioid prescriptions. Functional ability and status Nutritional status Physical activity Advanced directives List of other physicians Hospitalizations, surgeries, and ER visits in previous 12 months Vitals Screenings to include cognitive, depression, and falls Referrals and appointments  In addition, I have reviewed and discussed with patient certain preventive protocols, quality metrics, and best practice recommendations. A written personalized care plan for preventive services as well as general preventive health recommendations were provided to patient.     Willette Brace, LPN   [id number]   Nurse Notes: None

## 2021-02-25 ENCOUNTER — Ambulatory Visit: Payer: Medicare HMO

## 2021-03-05 ENCOUNTER — Other Ambulatory Visit: Payer: Self-pay | Admitting: Family Medicine

## 2021-03-07 ENCOUNTER — Other Ambulatory Visit: Payer: Self-pay | Admitting: Family Medicine

## 2021-03-10 NOTE — Telephone Encounter (Signed)
You may send in benazepril 40 mg daily #90 with 3 refills and remove quinapril from medication list-please discuss with patient's wife and make sure she/he understands the change-if blood pressure elevates significantly or drops too much please let me know

## 2021-03-11 NOTE — Telephone Encounter (Signed)
See below

## 2021-04-16 ENCOUNTER — Telehealth: Payer: Self-pay | Admitting: Family Medicine

## 2021-04-16 ENCOUNTER — Other Ambulatory Visit: Payer: Self-pay

## 2021-04-16 MED ORDER — BENAZEPRIL HCL 40 MG PO TABS: 40.0000 mg | ORAL_TABLET | Freq: Every day | ORAL | 3 refills | Status: DC

## 2021-04-16 NOTE — Telephone Encounter (Signed)
Patient's called stating that they need for one of pt's prescriptions changed. Please call pt at the number listed.

## 2021-04-16 NOTE — Telephone Encounter (Signed)
Returned call and pt wanted to inform us that his insurance has changed his quanalipril to Benazepril due to a recall on quanalipril. New rx has been sent.

## 2021-04-20 ENCOUNTER — Other Ambulatory Visit: Payer: Self-pay | Admitting: Family Medicine

## 2021-05-10 NOTE — Progress Notes (Incomplete)
Phone: [phone]   Subjective:  Patient presents today for their annual physical. Chief complaint-noted.   See problem oriented charting- ROS- full  review of systems was completed and negative  except for: ***  The following were reviewed and entered/updated in epic: Past Medical History:  Diagnosis Date   Allergy    Ankle injury 06/01/2001   no surgery- skin debridement    Arthritis    Basal cell carcinoma 05/28/2014   L nasal tip. Mohs Dr. Link Snuffer.     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 05/28/2014   Osteoarthritis 05/28/2014   Erectile dysfunction 05/28/2014   Basal cell carcinoma 05/28/2014   Essential hypertension 04/23/2014   Hyperlipidemia 04/23/2014   GERD (gastroesophageal reflux disease) 04/23/2014   Type 2 diabetes mellitus with diabetic chronic kidney disease ([place]) 04/23/2014   Hypothyroidism 04/23/2014   Allergic rhinitis 04/23/2014   Glaucoma 04/23/2014   History of nonmelanoma skin cancer 11/18/2012   History of shingles 04/24/2011   Past Surgical  History:  Procedure Laterality Date   abdominl abcess     03-15-2011   BACK SURGERY     CATARACT EXTRACTION, BILATERAL     04-07-2014, 03-17-2014   COLONOSCOPY     2012, 5 year repeat   CYSTOURETHROSCOPY  11/11/1998   ESOPHAGOGASTRODUODENOSCOPY  2010   PUD   LUMBAR LAMINECTOMY  2007   [place]   MOHS SURGERY     left side of nose   POLYPECTOMY     PROSTATE BIOPSY     radiation 2016   RECONSTRUCTION TENDON PULLEY W/ TENDON / FASCIAL GRAFT OF HAND / FINGER  1982   rt 3rd finger   RECONSTRUCTION TENDON PULLEY W/ TENDON / FASCIAL GRAFT OF HAND / FINGER  1953   rt 5th finger   UPPER GASTROINTESTINAL ENDOSCOPY      Family History  Problem Relation Age of Onset   Diabetes Mother    Diabetes Father    Hyperlipidemia Father    Colon polyps Father    Brain cancer Brother        smoker   Dementia Paternal Grandmother    Colon cancer Neg Hx    Esophageal cancer Neg Hx    Stomach cancer Neg Hx    Rectal cancer Neg Hx     Medications- reviewed and updated Current Outpatient Medications  Medication Sig Dispense Refill   acetaminophen (TYLENOL) 500 MG tablet Take 500 mg by mouth every 6 (six) hours as needed. Reported on 06/16/2015  allopurinol (ZYLOPRIM) 100 MG tablet TAKE 1 TABLET BY MOUTH TWICE A DAY 180 tablet 3   amLODipine (NORVASC) 5 MG tablet TAKE 1 TABLET BY MOUTH EVERY DAY 90 tablet 1   benazepril (LOTENSIN) 40 MG tablet Take 1 tablet (40 mg total) by mouth daily. 90 tablet 3   carvedilol (COREG) 25 MG tablet TAKE 1/2 TABLETS BY MOUTH 2 TIMES DAILY WITH A MEAL. 90 tablet 3   chlorthalidone (HYGROTON) 25 MG tablet TAKE 1/2-1 TABLET BY MOUTH DAILY. 90 tablet 1   donepezil (ARICEPT) 10 MG tablet Take 1 tablet (10 mg total) by mouth daily. 90 tablet 3   famotidine (PEPCID) 20 MG tablet Take 20 mg by mouth daily.     fexofenadine (ALLEGRA) 180 MG tablet Take 180 mg by mouth.     fluticasone (FLONASE) 50 MCG/ACT nasal spray Place 2 sprays into both nostrils daily. 48 g 3    FLUZONE HIGH-DOSE QUADRIVALENT 0.7 ML SUSY      KLOR-CON M20 20 MEQ tablet TAKE 2 TABLETS (40 MEQ TOTAL) BY MOUTH 2 (TWO) TIMES DAILY. 360 tablet 3   levothyroxine (SYNTHROID) 50 MCG tablet TAKE 1 TABLET BY MOUTH EVERY DAY 90 tablet 3   memantine (NAMENDA) 10 MG tablet Take 1 tablet twice a day 180 tablet 3   Multiple Vitamin (MULTIVITAMIN) tablet Take 1 tablet by mouth daily.     PFIZER COVID-19 VAC BIVALENT injection      pravastatin (PRAVACHOL) 40 MG tablet TAKE 1 TABLET BY MOUTH EVERY DAY 90 tablet 3   SHINGRIX injection      No current facility-administered medications for this visit.    Allergies-reviewed and updated Allergies  Allergen Reactions   Aspirin Other (See Comments)    Stomach ulcers   Nsaids Other (See Comments)    Stomach ulcers   Amoxicillin-Pot Clavulanate Rash   Sulfa Antibiotics Swelling and Rash    Social History   Social History Narrative   Married (wife patient outside Network engineer). 2 children. 5 grandkids.       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       Hobbies: travel, enjoys going to shows, [facility] women's basketball tournament. Doesn't watch tv.             Lives in 1 story condo on the 3rd floor   Bachelor's degree   Right handed   Objective  Objective:  There were no vitals taken for this visit. Gen: NAD, resting comfortably HEENT: Mucous membranes are moist. Oropharynx normal Neck: no thyromegaly CV: RRR no murmurs rubs or gallops Lungs: CTAB no crackles, wheeze, rhonchi Abdomen: soft/nontender/nondistended/normal bowel sounds. No rebound or guarding.  Ext: no edema Skin: warm, dry Neuro: grossly normal, moves all extremities, PERRLA ***   Assessment and Plan  81 y.o. male presenting for annual physical.  Health Maintenance counseling: 1. Anticipatory guidance: Patient counseled regarding regular dental exams ***q6 months, eye exams ***yearly,  avoiding smoking and second hand smoke*** , limiting alcohol to 2 beverages per  day-very rare which is ideal with dementia.   ***.  No illicit drugs - *** 2. Risk factor reduction:  Advised patient of need for regular exercise and diet rich and fruits and vegetables to reduce risk of heart attack and stroke.  Exercise- - recommended starting- gave info on silver sneakers for [employer]***.  Diet/weight management-eats reasonably healthy-   Weight up 5 lbs from last physical- already portion sizes low per wife***.  Wt Readings from Last 3 Encounters:  12/16/20 208 lb (  94.3 kg)  11/19/20 209 lb 3.2 oz (94.9 kg)  06/17/20 210 lb (95.3 kg)   3. Immunizations/screenings/ancillary studies Immunization History  Administered Date(s) Administered   Fluad Quad(high Dose 65+) 01/07/2019, 01/08/2020   Hepatitis B 06/19/2007, 11/16/2007, 04/18/2008   Hepatitis B, ped/adol 06/19/2007, 11/16/2007, 04/18/2008   Influenza Whole 01/27/2012   Influenza, High Dose Seasonal PF 12/21/2015, 12/20/2016, 12/01/2017   Influenza,inj,Quad PF,6+ Mos 02/24/2015   Influenza-Unspecified 01/27/2012, 01/21/2014, 12/31/2020   Meningococcal Polysaccharide 12/19/2008   PFIZER Comirnaty(Gray Top)Covid-19 Tri-Sucrose Vaccine 06/16/2020   PFIZER(Purple Top)SARS-COV-2 Vaccination 03/27/2019, 04/17/2019, 12/10/2019   Pfizer Covid-19 Vaccine Bivalent Booster 80yr & up 12/01/2020   Pneumococcal Conjugate-13 08/25/2014   Pneumococcal Polysaccharide-23 06/19/2007, 12/19/2008, 12/21/2015   Td 04/23/2014   Tdap 03/07/2001   Zoster Recombinat (Shingrix) 05/19/2020, 07/21/2020   Zoster, Live 08/12/2008   There are no preventive care reminders to display for this patient. 4. Prostate cancer screening- history of prostate cancer-[facility].  PSAs checked there***  Lab Results  Component Value Date   PSA 0.09 (L) 05/24/2018   PSA 0.12 01/03/2018   PSA 0.137 07/12/2017   5. Colon cancer screening - 04/13/16 with no repeats-  Dr. PHilarie Fredricksonpreviously released him from further  colonoscopy but due to blood in the stool and change in bowel habits we have referred him back*** 6. Skin cancer screening--follows with Dr. WElvera Lennoxagrees with dermatology for history of skin cancer ***advised regular sunscreen use. Denies worrisome, changing, or new skin lesions.  7. Smoking associated screening (lung cancer screening, AAA screen 65-75, UA)- former smoker- occasional cigars in the past.  Has not had any in over 10 years.  *** 8. STD screening - -only active with wife***  Status of chronic or acute concerns   02/20/20 AWV  05/19/20 CPE***  ***Ophthodoes not appear to know he has diabetes ***walks with cane  ***did not have best experience with [facility] (enjoys Dr. ADelice Leschthough)- considering transfering to Dr. TEverette Rankin [place]  *** GI 06/17/20 Positive fecal immunochemistry: Patient denies any overt rectal bleeding. His hemoglobin is normal. He does have history of colon polyps on last colonoscopy 4 years ago. He has some intermittent diarrhea, which could certainly be medication related/side effects (Aricept, allopurinol, namenda), but both he and his wife note improvement after beginning Florastor probiotic daily. He is 81years old with dementia. He does not want to proceed with colonoscopy and his wife is in agreement with that. They both understand that we certainly do not know 100% what has caused his positive fecal immunochemistry study without preceding with colonoscopy, but we can speculate that it could have been related to hemorrhoids or radiation proctitis. They understand and decline colonoscopy for now. We will continue to observe his clinical state. They will [place] [facility] for any other issues. Would recommend discontinuing fecal immunochemistry studies for screening in this patient.  #Dementia.  Last visit patient was considered transferring to Dr. TEverette Rankof neurology.  He was on Aricept and Namenda.  11/2020 visit,  he reported some worsened gradually  since last visit- agreed to follow up with neurology.    #Diarrhea-prior fecal occult positive stools-possibly related to hemorrhoids or radiation proctitis per GI.  They recommended discontinuing any fecal immunochemistry studies.  Patient with prior flareup of diarrhea-currently only intermittent issues- nothing like last time.     # Diabetes S: Medication: Diet controlled CBGs- *** Exercise and diet- *** Lab Results  Component Value Date  HGBA1C 6.5 11/19/2020   HGBA1C 6.4 05/07/2020   HGBA1C 6.4 09/17/2019    A/P: ***  #hypertension S: medication: benazepril 40 mg chlorthalidone 25 mg, amlodipine 5 mg, carvedilol 25 mg twice daily.  Takes potassium with chlorthalidone- high dose -Quinapril 40 mg in the past Home readings #s: *** BP Readings from Last 3 Encounters:  12/16/20 125/71  11/19/20 132/64  06/17/20 (!) 146/70  A/P: ***   #hyperlipidemia/aortic atherosclerosis S: Medication: Pravastatin 40 mg Lab Results  Component Value Date   CHOL 117 05/07/2020   HDL 47.70 05/07/2020   LDLCALC 40 05/07/2020   LDLDIRECT 90.0 09/28/2016   TRIG 144.0 05/07/2020   CHOLHDL 2 05/07/2020   A/P: ***   #hypothyroidism S: compliant On thyroid medication-levothyroxine daily 50 mcg  Lab Results  Component Value Date   TSH 2.91 11/19/2020    A/P:***   #Gout S: 0 *** flares in 6 months on allopurinol 100 mg Lab Results  Component Value Date   LABURIC 5.7 05/07/2020   A/P:***   #history of prostate cancer- getting close to being changed to annual checks- possibly next visit. Prior radiation. Psa will be checked with them  Recommended follow up: No follow-ups on file. Future Appointments  Date Time Provider [place]  05/25/2021  1:15 PM Gardiner Barefoot, DPM [facility]  05/26/2021 10:40 AM Marin Olp, MD [facility]  06/16/2021 11:30 AM Rondel Jumbo, PA-C [facility] None  03/15/2022 11:00 AM [facility] HEALTH COACH [facility]    No chief complaint  on file.  Lab/Order associations:*** fasting No diagnosis found.  No orders of the defined types were placed in this encounter.   I,Jada Bradford,acting as a scribe for Garret Reddish, MD.,have documented all relevant documentation on the behalf of Garret Reddish, MD,as directed by  Garret Reddish, MD while in the presence of Garret Reddish, MD.  *** Return precautions advised.  Burnett Corrente

## 2021-05-25 ENCOUNTER — Other Ambulatory Visit: Payer: Self-pay

## 2021-05-25 ENCOUNTER — Encounter: Payer: Self-pay | Admitting: Podiatry

## 2021-05-25 ENCOUNTER — Ambulatory Visit (INDEPENDENT_AMBULATORY_CARE_PROVIDER_SITE_OTHER): Payer: Medicare HMO | Admitting: Podiatry

## 2021-05-25 DIAGNOSIS — B351 Tinea unguium: Secondary | ICD-10-CM

## 2021-05-25 DIAGNOSIS — N1831 Chronic kidney disease, stage 3a: Secondary | ICD-10-CM

## 2021-05-25 DIAGNOSIS — M79675 Pain in left toe(s): Secondary | ICD-10-CM | POA: Diagnosis not present

## 2021-05-25 DIAGNOSIS — M79674 Pain in right toe(s): Secondary | ICD-10-CM

## 2021-05-25 DIAGNOSIS — E1122 Type 2 diabetes mellitus with diabetic chronic kidney disease: Secondary | ICD-10-CM | POA: Diagnosis not present

## 2021-05-25 DIAGNOSIS — N183 Chronic kidney disease, stage 3 unspecified: Secondary | ICD-10-CM

## 2021-05-25 DIAGNOSIS — Q828 Other specified congenital malformations of skin: Secondary | ICD-10-CM | POA: Diagnosis not present

## 2021-05-25 NOTE — Progress Notes (Signed)
This patient returns to my office for at risk foot care.  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  This patient is unable to cut nails himself since the patient cannot reach his nails.These nails are painful walking and wearing shoes. He also has painful callus on both feet which are not painful. This callus is painful walking. He presents to the office with his wife. This patient presents for at risk foot care today.  General Appearance  Alert, conversant and in no acute stress.  Vascular  Dorsalis pedis and posterior tibial  pulses are palpable  bilaterally.  Capillary return is within normal limits  bilaterally. Temperature is within normal limits  bilaterally.  Neurologic  Senn-Weinstein monofilament wire test within normal limits  bilaterally. Muscle power within normal limits bilaterally.  Nails Thick disfigured discolored nails with subungual debris  from hallux to fifth toes bilaterally. No evidence of bacterial infection or drainage bilaterally.  Orthopedic  No limitations of motion  feet .  No crepitus or effusions noted.  No bony pathology or digital deformities noted.  Skin  normotropic skin with no porokeratosis noted bilaterally.  No signs of infections or ulcers noted.   Callus sub 5th met B/L  symptomatic.  Onychomycosis  Pain in right toes  Pain in toes Callus sub 5th right foot.  Consent was obtained for treatment procedures.   Mechanical debridement of nails 1-5  bilaterally performed with a nail nipper.  Filed with dremel without incident.   Debride callus sub 5th met right foot with # 15 blade.   Return office visit    3 months                  Told patient to return for periodic foot care and evaluation due to potential at risk complications.   Georgia Delsignore DPM   

## 2021-05-26 ENCOUNTER — Ambulatory Visit (INDEPENDENT_AMBULATORY_CARE_PROVIDER_SITE_OTHER)
Admission: RE | Admit: 2021-05-26 | Discharge: 2021-05-26 | Disposition: A | Discharge status: Home / Self Care | Payer: Medicare HMO | Source: Ambulatory Visit | Attending: Family Medicine | Admitting: Family Medicine

## 2021-05-26 ENCOUNTER — Encounter: Payer: Self-pay | Admitting: Family Medicine

## 2021-05-26 ENCOUNTER — Ambulatory Visit (INDEPENDENT_AMBULATORY_CARE_PROVIDER_SITE_OTHER): Payer: Medicare HMO | Admitting: Family Medicine

## 2021-05-26 VITALS — BP 128/64 | HR 65 | Temp 97.2°F | Ht 70.0 in | Wt 215.0 lb

## 2021-05-26 DIAGNOSIS — E1122 Type 2 diabetes mellitus with diabetic chronic kidney disease: Secondary | ICD-10-CM | POA: Diagnosis not present

## 2021-05-26 DIAGNOSIS — Z0000 Encounter for general adult medical examination without abnormal findings: Secondary | ICD-10-CM

## 2021-05-26 DIAGNOSIS — M25551 Pain in right hip: Secondary | ICD-10-CM

## 2021-05-26 DIAGNOSIS — I10 Essential (primary) hypertension: Secondary | ICD-10-CM | POA: Diagnosis not present

## 2021-05-26 DIAGNOSIS — R69 Illness, unspecified: Secondary | ICD-10-CM | POA: Diagnosis not present

## 2021-05-26 DIAGNOSIS — E039 Hypothyroidism, unspecified: Secondary | ICD-10-CM

## 2021-05-26 DIAGNOSIS — R591 Generalized enlarged lymph nodes: Secondary | ICD-10-CM

## 2021-05-26 DIAGNOSIS — Z0001 Encounter for general adult medical examination with abnormal findings: Secondary | ICD-10-CM

## 2021-05-26 DIAGNOSIS — F03A0 Unspecified dementia, mild, without behavioral disturbance, psychotic disturbance, mood disturbance, and anxiety: Secondary | ICD-10-CM

## 2021-05-26 DIAGNOSIS — Z8546 Personal history of malignant neoplasm of prostate: Secondary | ICD-10-CM

## 2021-05-26 DIAGNOSIS — I700 Atherosclerosis of aorta: Secondary | ICD-10-CM

## 2021-05-26 DIAGNOSIS — M1611 Unilateral primary osteoarthritis, right hip: Secondary | ICD-10-CM

## 2021-05-26 DIAGNOSIS — M1A0720 Idiopathic chronic gout, left ankle and foot, without tophus (tophi): Secondary | ICD-10-CM | POA: Diagnosis not present

## 2021-05-26 DIAGNOSIS — E785 Hyperlipidemia, unspecified: Secondary | ICD-10-CM

## 2021-05-26 DIAGNOSIS — N1831 Chronic kidney disease, stage 3a: Secondary | ICD-10-CM

## 2021-05-26 LAB — CBC WITH DIFFERENTIAL/PLATELET
Basophils Absolute: 0 10*3/uL (ref 0.0–0.1)
Basophils Relative: 0.6 % (ref 0.0–3.0)
Eosinophils Absolute: 0.3 10*3/uL (ref 0.0–0.7)
Eosinophils Relative: 3.6 % (ref 0.0–5.0)
HCT: 44.6 % (ref 39.0–52.0)
Hemoglobin: 14.9 g/dL (ref 13.0–17.0)
Lymphocytes Relative: 14.6 % (ref 12.0–46.0)
Lymphs Abs: 1.2 10*3/uL (ref 0.7–4.0)
MCHC: 33.5 g/dL (ref 30.0–36.0)
MCV: 94.4 fl (ref 78.0–100.0)
Monocytes Absolute: 0.7 10*3/uL (ref 0.1–1.0)
Monocytes Relative: 8.8 % (ref 3.0–12.0)
Neutro Abs: 6.1 10*3/uL (ref 1.4–7.7)
Neutrophils Relative %: 72.4 % (ref 43.0–77.0)
Platelets: 181 10*3/uL (ref 150.0–400.0)
RBC: 4.72 Mil/uL (ref 4.22–5.81)
RDW: 14 % (ref 11.5–15.5)
WBC: 8.4 10*3/uL (ref 4.0–10.5)

## 2021-05-26 LAB — COMPREHENSIVE METABOLIC PANEL
ALT: 19 U/L (ref 0–53)
AST: 21 U/L (ref 0–37)
Albumin: 4.3 g/dL (ref 3.5–5.2)
Alkaline Phosphatase: 65 U/L (ref 39–117)
BUN: 19 mg/dL (ref 6–23)
CO2: 36 mEq/L — ABNORMAL HIGH (ref 19–32)
Calcium: 9.9 mg/dL (ref 8.4–10.5)
Chloride: 102 mEq/L (ref 96–112)
Creatinine, Ser: 1.44 mg/dL (ref 0.40–1.50)
GFR: 45.09 mL/min — ABNORMAL LOW (ref >60.00)
Glucose, Bld: 149 mg/dL — ABNORMAL HIGH (ref 70–99)
Potassium: 4.8 mEq/L (ref 3.5–5.1)
Sodium: 144 mEq/L (ref 135–145)
Total Bilirubin: 0.9 mg/dL (ref 0.2–1.2)
Total Protein: 6.1 g/dL (ref 6.0–8.3)

## 2021-05-26 LAB — LIPID PANEL
Cholesterol: 137 mg/dL (ref 0–200)
HDL: 54.1 mg/dL (ref >39.00)
LDL Cholesterol: 60 mg/dL (ref 0–99)
NonHDL: 82.92
Total CHOL/HDL Ratio: 3
Triglycerides: 114 mg/dL (ref 0.0–149.0)
VLDL: 22.8 mg/dL (ref 0.0–40.0)

## 2021-05-26 LAB — URIC ACID: Uric Acid, Serum: 6.7 mg/dL (ref 4.0–7.8)

## 2021-05-26 LAB — TSH: TSH: 4.94 u[IU]/mL (ref 0.35–5.50)

## 2021-05-26 MED ORDER — METHYLPREDNISOLONE 4 MG PO TBPK: ORAL_TABLET | ORAL | 0 refills | Status: DC

## 2021-05-26 NOTE — Progress Notes (Addendum)
?Phone [phone] ?In person visit ?  ?Subjective:  ? ?Eddie Ferguson. Rog is a 81 y.o. year old very pleasant male patient who presents for/with See problem oriented charting ? ?This visit occurred during the SARS-CoV-2 public health emergency.  Safety protocols were in place, including screening questions prior to the visit, additional usage of staff PPE, and extensive cleaning of exam room while observing appropriate contact time as indicated for disinfecting solutions.  ? ?Past Medical History-  ?Patient Active Problem List  ? Diagnosis Date Noted  ? Moderate dementia without behavioral disturbance 06/16/2015  ?  Priority: High  ? History of prostate cancer-[facility]s with [facility] 05/28/2014  ?  Priority: High  ? Type 2 diabetes mellitus with diabetic chronic kidney disease ([place]) 04/23/2014  ?  Priority: High  ? Aortic atherosclerosis ([place]) 01/05/2018  ?  Priority: Medium   ? CKD (chronic kidney disease), stage III ([place]) 04/18/2017  ?  Priority: Medium   ? Gout 07/09/2015  ?  Priority: Medium   ? Essential hypertension 04/23/2014  ?  Priority: Medium   ? Hyperlipidemia 04/23/2014  ?  Priority: Medium   ? Hypothyroidism 04/23/2014  ?  Priority: Medium   ? Adenomatous colon polyp 05/28/2014  ?  Priority: Low  ? Osteoarthritis 05/28/2014  ?  Priority: Low  ? Erectile dysfunction 05/28/2014  ?  Priority: Low  ? Basal cell carcinoma 05/28/2014  ?  Priority: Low  ? GERD (gastroesophageal reflux disease) 04/23/2014  ?  Priority: Low  ? Allergic rhinitis 04/23/2014  ?  Priority: Low  ? Glaucoma 04/23/2014  ?  Priority: Low  ? History of nonmelanoma skin cancer 11/18/2012  ?  Priority: Low  ? Pain due to onychomycosis of toenails of both feet 08/17/2020  ? Porokeratosis 08/17/2020  ? Positive FIT (fecal immunochemical test) 06/17/2020  ? Intermittent diarrhea 06/17/2020  ? History of shingles 04/24/2011  ? ? ?Medications- reviewed and updated ?Current Outpatient Medications  ?Medication Sig Dispense  Refill  ? acetaminophen (TYLENOL) 500 MG tablet Take 500 mg by mouth every 6 (six) hours as needed. Reported on 06/16/2015    ? allopurinol (ZYLOPRIM) 100 MG tablet TAKE 1 TABLET BY MOUTH TWICE A DAY 180 tablet 3  ? amLODipine (NORVASC) 5 MG tablet TAKE 1 TABLET BY MOUTH EVERY DAY 90 tablet 1  ? benazepril (LOTENSIN) 40 MG tablet Take 1 tablet (40 mg total) by mouth daily. 90 tablet 3  ? carvedilol (COREG) 25 MG tablet TAKE 1/2 TABLETS BY MOUTH 2 TIMES DAILY WITH A MEAL. 90 tablet 3  ? chlorthalidone (HYGROTON) 25 MG tablet TAKE 1/2-1 TABLET BY MOUTH DAILY. 90 tablet 1  ? donepezil (ARICEPT) 10 MG tablet Take 1 tablet (10 mg total) by mouth daily. 90 tablet 3  ? famotidine (PEPCID) 20 MG tablet Take 20 mg by mouth daily.    ? fexofenadine (ALLEGRA) 180 MG tablet Take 180 mg by mouth.    ? fluticasone (FLONASE) 50 MCG/ACT nasal spray Place 2 sprays into both nostrils daily. 48 g 3  ? KLOR-CON M20 20 MEQ tablet TAKE 2 TABLETS (40 MEQ TOTAL) BY MOUTH 2 (TWO) TIMES DAILY. 360 tablet 3  ? levothyroxine (SYNTHROID) 50 MCG tablet TAKE 1 TABLET BY MOUTH EVERY DAY 90 tablet 3  ? memantine (NAMENDA) 10 MG tablet Take 1 tablet twice a day 180 tablet 3  ? methylPREDNISolone (MEDROL DOSEPAK) 4 MG TBPK tablet Take 6 on day 1, 5 on day 2, 4 on day 3,  3 on day 4, 2 on day 5, 1 on day 6 21 each 0  ? Multiple Vitamin (MULTIVITAMIN) tablet Take 1 tablet by mouth daily.    ? pravastatin (PRAVACHOL) 40 MG tablet TAKE 1 TABLET BY MOUTH EVERY DAY 90 tablet 3  ? ?No current facility-administered medications for this visit.  ? ?  ?Objective:  ?BP 128/64   Pulse 65   Temp (!) 97.2 ?F (36.2 ?C)   Ht '5\' 10"'$  (1.778 m)   Wt 215 lb (97.5 kg)   SpO2 96%   BMI 30.85 kg/m?  ?Gen: NAD, resting comfortably ?Left hip exam normal ?Right hip- pain with IR, ER, flexion, stenchfield test. Good strength ?  ? ?Assessment and Plan  ? ?#Right Hip pain ?S: Hip pain started 6 to 8 weeks ago-causing difficulty with walking. No fall or injury. Hip even gives  way- gets sensation of weakness. Pain 2-3/10 he states but wife thinks worse than this.  ? ?From 07/12/2018  with Dr. Ninfa Linden x-ray "There is moderate osteoarthritis of the right hip comparing the right and left hips with joint space narrowing and sclerotic changes as well as para-articular osteophytes on the right hip." ?A/P: worsening Right hip pain- on exam I am concerned for arthritis. Offered SM or ortho refer. They want to trial steroid course again and update x-ray (make sure no fracture first) and if no better in 2 weeks would refer to orthopedics or sports medicine  ?-continue cane and make sure to rest if feels leg is going to give way- have to avoid falls!  ? ?# Lymphadenopathy ?S:wife has noted lymph node just behind the left law persistent for at least a month if not much longer- but they have had a lot going on so she cannot pin down exactly. Does not feel poorly overall  ?A/P: with persistent lymphadenopathy though under 1 cm wanted to get ENT opinion if needs to be biopsied- refer today . Also check bloodwork ? ? ? ?Recommended follow up: update me in 2 weeks ?Future Appointments  ?Date Time Provider [place]  ?06/16/2021 11:30 AM Shawn Route, Coralee Pesa, PA-C [facility] None  ?08/25/2021  1:30 PM Gardiner Barefoot, DPM [facility]  ?03/15/2022 11:00 AM [facility] HEALTH COACH [facility]  ? ? ?Lab/Order associations: ?  ICD-10-CM   ?1. Primary osteoarthritis of right hip  M16.11   ?  ?2. Right hip pain  M25.551 methylPREDNISolone (MEDROL DOSEPAK) 4 MG TBPK tablet  ?  DG HIP UNILAT W OR W/O PELVIS 2-3 VIEWS RIGHT  ? ?3. Lymphadenopathy of head and neck  R59.1 Ambulatory referral to ENT  ?  ?  ? ? ?Meds ordered this encounter  ?Medications  ? methylPREDNISolone (MEDROL DOSEPAK) 4 MG TBPK tablet  ?  Sig: Take 6 on day 1, 5 on day 2, 4 on day 3, 3 on day 4, 2 on day 5, 1 on day 6  ?  Dispense:  21 each  ?  Refill:  0  ? ? ?Return precautions advised.  ?Garret Reddish, MD ? ? ?

## 2021-05-26 NOTE — Patient Instructions (Addendum)
Have copy of eye exam faxed over to [place] when you go [phone]. ? ?We will call you within two weeks about your referral to ENT. If you do not hear within 2 weeks, give [place] a call.   ? ?- ongoing hand cramping - we will rial off pravastatin for 3 weeks at some point to see if this makes a difference- restart if needed ? ?Please go to [facility] (updated 05/02/2019) ?- located [address] across the street from [place] ?- in the basement ?- Hours: 8:30-5:00 PM M-F (with lunch from 12:30- 1 PM). You do NOT need an appointment.  ?- Please ensure you are covid symptom free before going in(No fever, chills, cough, congestion, runny nose, shortness of breath, fatigue, body aches, sore throat, headache, nausea, vomiting, diarrhea, or new loss of taste or smell. No known contacts with covid 19 or someone being tested for covid 19) ? ?If x-ray shows no fracture- can start the medrol dose pack (steroid) - will raise sugar and may raise blood pressure short term ?- if no better in 2 weeks would refer to orthopedics or sports medicine  ? ?Please stop by lab before you go ?If you have mychart- we will send your results within 3 business days of [place] receiving them.  ?If you do not have mychart- we will call you about results within 5 business days of [place] receiving them.  ?*please also note that you will see labs on mychart as soon as they post. I will later go in and write notes on them- will say "notes from Dr. Yong Channel"  ? ?Recommended follow up: Return in about 6 months (around 11/26/2021) for followup or sooner if needed.Schedule b4 you leave.  ?

## 2021-05-27 ENCOUNTER — Telehealth: Payer: Self-pay | Admitting: Family Medicine

## 2021-05-27 NOTE — Telephone Encounter (Signed)
Called and spoke with pt wife, made aware that results are back however Dr. Yong Channel has not reviewed them/commented on them. Once he has I will contact them with results.  ?

## 2021-05-27 NOTE — Telephone Encounter (Signed)
Patients wife wants a call back for lab results and x ray results- Patient and wife have access to [facility]- Would prefer a phone call as well.  ?

## 2021-05-28 ENCOUNTER — Other Ambulatory Visit: Payer: Self-pay

## 2021-05-28 DIAGNOSIS — N1831 Chronic kidney disease, stage 3a: Secondary | ICD-10-CM

## 2021-06-01 ENCOUNTER — Other Ambulatory Visit (INDEPENDENT_AMBULATORY_CARE_PROVIDER_SITE_OTHER): Payer: Medicare HMO

## 2021-06-01 ENCOUNTER — Other Ambulatory Visit: Payer: Self-pay

## 2021-06-01 DIAGNOSIS — N1831 Chronic kidney disease, stage 3a: Secondary | ICD-10-CM

## 2021-06-01 DIAGNOSIS — E1122 Type 2 diabetes mellitus with diabetic chronic kidney disease: Secondary | ICD-10-CM | POA: Diagnosis not present

## 2021-06-01 LAB — POCT GLYCOSYLATED HEMOGLOBIN (HGB A1C): Hemoglobin A1C: 6.7 % — AB (ref 4.0–5.6)

## 2021-06-02 ENCOUNTER — Other Ambulatory Visit: Payer: Self-pay

## 2021-06-02 DIAGNOSIS — M25551 Pain in right hip: Secondary | ICD-10-CM

## 2021-06-02 DIAGNOSIS — Z8546 Personal history of malignant neoplasm of prostate: Secondary | ICD-10-CM | POA: Diagnosis not present

## 2021-06-08 DIAGNOSIS — H5212 Myopia, left eye: Secondary | ICD-10-CM | POA: Diagnosis not present

## 2021-06-08 DIAGNOSIS — E119 Type 2 diabetes mellitus without complications: Secondary | ICD-10-CM | POA: Diagnosis not present

## 2021-06-08 LAB — HM DIABETES EYE EXAM

## 2021-06-09 ENCOUNTER — Other Ambulatory Visit: Payer: Self-pay | Admitting: Family Medicine

## 2021-06-09 DIAGNOSIS — Z8546 Personal history of malignant neoplasm of prostate: Secondary | ICD-10-CM | POA: Diagnosis not present

## 2021-06-16 ENCOUNTER — Encounter: Payer: Self-pay | Admitting: Physician Assistant

## 2021-06-16 ENCOUNTER — Ambulatory Visit: Payer: Medicare HMO | Admitting: Physician Assistant

## 2021-06-16 VITALS — BP 118/68 | HR 69 | Ht 70.0 in | Wt 214.0 lb

## 2021-06-16 DIAGNOSIS — F03A0 Unspecified dementia, mild, without behavioral disturbance, psychotic disturbance, mood disturbance, and anxiety: Secondary | ICD-10-CM

## 2021-06-16 DIAGNOSIS — G309 Alzheimer's disease, unspecified: Secondary | ICD-10-CM

## 2021-06-16 DIAGNOSIS — R69 Illness, unspecified: Secondary | ICD-10-CM | POA: Diagnosis not present

## 2021-06-16 DIAGNOSIS — F0280 Dementia in other diseases classified elsewhere without behavioral disturbance: Secondary | ICD-10-CM | POA: Diagnosis not present

## 2021-06-16 MED ORDER — DONEPEZIL HCL 10 MG PO TABS: 10.0000 mg | ORAL_TABLET | Freq: Every day | ORAL | 3 refills | Status: DC

## 2021-06-16 MED ORDER — MEMANTINE HCL 10 MG PO TABS: ORAL_TABLET | ORAL | 3 refills | Status: DC

## 2021-06-16 NOTE — Patient Instructions (Addendum)
It was a pleasure to see you today at our office.  ? ?Recommendations: ? ?Meds: ?Follow up in 6  Months ?Continue same meds Refill for Aricept 10 mg daily and Namenda 10 mg twice daily  ?Feel free to visit Facebook page " [email or web address]" for tips of how to care for people with memory problems.  ?Neurocognitive testing for clarity of diagnosis  ? ? ?RECOMMENDATIONS FOR ALL PATIENTS WITH MEMORY PROBLEMS: ?1. Continue to exercise (Recommend 30 minutes of walking everyday, or 3 hours every week) ?2. Increase social interactions - continue going to [place] and enjoy social gatherings with friends and family ?3. Eat healthy, avoid fried foods and eat more fruits and vegetables ?4. Maintain adequate blood pressure, blood sugar, and blood cholesterol level. Reducing the risk of stroke and cardiovascular disease also helps promoting better memory. ?5. Avoid stressful situations. Live a simple life and avoid aggravations. Organize your time and prepare for the next day in anticipation. ?6. Sleep well, avoid any interruptions of sleep and avoid any distractions in the bedroom that may interfere with adequate sleep quality ?7. Avoid sugar, avoid sweets as there is a strong link between excessive sugar intake, diabetes, and cognitive impairment ?We discussed the Mediterranean diet, which has been shown to help patients reduce the risk of progressive memory disorders and reduces cardiovascular risk. This includes eating fish, eat fruits and green leafy vegetables, nuts like almonds and hazelnuts, walnuts, and also use olive oil. Avoid fast foods and fried foods as much as possible. Avoid sweets and sugar as sugar use has been linked to worsening of memory function. ? ?There is always a concern of gradual progression of memory problems. If this is the case, then we may need to adjust level of care according to patient needs. Support, both to the patient and caregiver, should then be put into place.  ? ? ? ? ?FALL PRECAUTIONS: Be cautious  when walking. Scan the area for obstacles that may increase the risk of trips and falls. When getting up in the mornings, sit up at the edge of the bed for a few minutes before getting out of bed. Consider elevating the bed at the head end to avoid drop of blood pressure when getting up. Walk always in a well-lit room (use night lights in the walls). Avoid area rugs or power cords from appliances in the middle of the walkways. Use a walker or a cane if necessary and consider physical therapy for balance exercise. Get your eyesight checked regularly. ? ? ? ? ?Mediterranean Diet ?A Mediterranean diet refers to food and lifestyle choices that are based on the traditions of countries located on the [employer]. This way of eating has been shown to help prevent certain conditions and improve outcomes for people who have chronic diseases, like kidney disease and heart disease. ?What are tips for following this plan? ?Lifestyle  ?Cook and eat meals together with your family, when possible. ?Drink enough fluid to keep your urine clear or pale yellow. ?Be physically active every day. This includes: ?Aerobic exercise like running or swimming. ?Leisure activities like gardening, walking, or housework. ?Get 7-8 hours of sleep each night. ?If recommended by your health care provider, drink red wine in moderation. This means 1 glass a day for nonpregnant women and 2 glasses a day for men. A glass of wine equals 5 oz (150 mL). ?Reading food labels  ?Check the serving size of packaged foods. For foods such as rice and pasta, the serving size refers  to the amount of cooked product, not dry. ?Check the total fat in packaged foods. Avoid foods that have saturated fat or trans fats. ?Check the ingredients list for added sugars, such as corn syrup. ?Shopping  ?At the grocery store, buy most of your food from the areas near the walls of the store. This includes: ?Fresh fruits and vegetables (produce). ?Grains, beans, nuts, and  seeds. Some of these may be available in unpackaged forms or large amounts (in bulk). ?Fresh seafood. ?Poultry and eggs. ?Low-fat dairy products. ?Buy whole ingredients instead of prepackaged foods. ?Buy fresh fruits and vegetables in-season from local farmers markets. ?Buy frozen fruits and vegetables in resealable bags. ?If you do not have access to quality fresh seafood, buy precooked frozen shrimp or canned fish, such as tuna, salmon, or sardines. ?Buy small amounts of raw or cooked vegetables, salads, or olives from the deli or salad bar at your store. ?Stock your pantry so you always have certain foods on hand, such as olive oil, canned tuna, canned tomatoes, rice, pasta, and beans. ?Cooking  ?Cook foods with extra-virgin olive oil instead of using butter or other vegetable oils. ?Have meat as a side dish, and have vegetables or grains as your main dish. This means having meat in small portions or adding small amounts of meat to foods like pasta or stew. ?Use beans or vegetables instead of meat in common dishes like chili or lasagna. ?Experiment with different cooking methods. Try roasting or broiling vegetables instead of steaming or saut?eing them. ?Add frozen vegetables to soups, stews, pasta, or rice. ?Add nuts or seeds for added healthy fat at each meal. You can add these to yogurt, salads, or vegetable dishes. ?Marinate fish or vegetables using olive oil, lemon juice, garlic, and fresh herbs. ?Meal planning  ?Plan to eat 1 vegetarian meal one day each week. Try to work up to 2 vegetarian meals, if possible. ?Eat seafood 2 or more times a week. ?Have healthy snacks readily available, such as: ?Vegetable sticks with hummus. ?[place] yogurt. ?Fruit and nut trail mix. ?Eat balanced meals throughout the week. This includes: ?Fruit: 2-3 servings a day ?Vegetables: 4-5 servings a day ?Low-fat dairy: 2 servings a day ?Fish, poultry, or lean meat: 1 serving a day ?Beans and legumes: 2 or more servings a week ?Nuts  and seeds: 1-2 servings a day ?Whole grains: 6-8 servings a day ?Extra-virgin olive oil: 3-4 servings a day ?Limit red meat and sweets to only a few servings a month ?What are my food choices? ?Mediterranean diet ?Recommended ?Grains: Whole-grain pasta. Brown rice. Bulgar wheat. Polenta. Couscous. Whole-wheat bread. [place]. ?Vegetables: Artichokes. Beets. Broccoli. Cabbage. Carrots. Eggplant. Green beans. Chard. Kale. Spinach. Onions. Leeks. Peas. Squash. Tomatoes. Peppers. Radishes. ?Fruits: Apples. Apricots. Avocado. Berries. Bananas. Cherries. Dates. Figs. Grapes. Lemons. Melon. Oranges. Peaches. Plums. Pomegranate. ?Meats and other protein foods: Beans. Almonds. Sunflower seeds. Pine nuts. Peanuts. [place]. Salmon. Scallops. Shrimp. [place]. Tilapia. Clams. Oysters. Eggs. ?Dairy: Low-fat milk. Cheese. Greek yogurt. ?Beverages: Water. Red wine. Herbal tea. ?Fats and oils: Extra virgin olive oil. Avocado oil. Grape seed oil. ?Sweets and desserts: [place] yogurt with honey. Baked apples. Poached pears. Trail mix. ?Seasoning and other foods: Basil. Cilantro. Coriander. Cumin. Mint. Parsley. Sage. Rosemary. Tarragon. Garlic. Oregano. Thyme. Pepper. Balsalmic vinegar. Tahini. Hummus. Tomato sauce. Olives. Mushrooms. ?Limit these ?Grains: Prepackaged pasta or rice dishes. Prepackaged cereal with added sugar. ?Vegetables: Deep fried potatoes (french fries). ?Fruits: Fruit canned in syrup. ?Meats and other protein foods: Beef. Pork. Lamb. Poultry  with skin. Hot dogs. [place] Salines. ?Dairy: Ice cream. Sour cream. Whole milk. ?Beverages: Juice. Sugar-sweetened soft drinks. Beer. Liquor and spirits. ?Fats and oils: Butter. Canola oil. Vegetable oil. Beef fat (tallow). Lard. ?Sweets and desserts: Cookies. Cakes. Pies. Candy. ?Seasoning and other foods: Mayonnaise. Premade sauces and marinades. ?The items listed may not be a complete list. Talk with your dietitian about what dietary choices are right for you. ?Summary ?The  Mediterranean diet includes both food and lifestyle choices. ?Eat a variety of fresh fruits and vegetables, beans, nuts, seeds, and whole grains. ?Limit the amount of red meat and sweets that you eat. ?Talk with your

## 2021-06-16 NOTE — Progress Notes (Signed)
? ?Assessment/Plan:  ? ? ?Mild dementia, late onset, likely due to Alzheimer's disease. ?MMSE today is 24/30, delayed recall 0/3, stable from prior exam.  He is on memantine 10 mg twice daily and donepezil 10 mg daily, tolerating well.   ? ?Discussed safety both in and out of the home.  ?Discussed the importance of regular daily schedule with inclusion of crossword puzzles to maintain brain function.  ?Continue to monitor mood by PCP ?Stay active at least 30 minutes at least 3 times a week. Continue PT  ?Naps should be scheduled and should be no longer than 60 minutes and should not occur after 2 PM.  ?Mediterranean diet is recommended  ?Control cardiovascular risk factors  ?Continue donepezil 10 mg daily and memantine 10 mg bid.  Side effects were discussed ?Follow up in  6 months. ? ? ?Case discussed with Dr. Tomi Likens who agrees with the plan ? ? ?Subjective:  ? ? ?Eddie Ferguson is a very pleasant 81 y.o. RH male with a history of hypertension, hyperlipidemia, diabetes, and a history of mild dementia likely due to Alzheimer's disease.  He was last seen on 12/16/2020, at which time his MoCA was 12/30.  He is seen today  in follow up for memory loss. This patient is accompanied in the office by his wife who supplements the history.  Previous records as well as any outside records available were reviewed prior to todays visit.  The patient is currently on memantine 10 mg twice daily and donepezil 10 mg daily, tolerating them well. ?He reports his memory being "fair ".  He continues to have some difficulty remembering stories, and his wife states that he does repeat himself.  Memory deficiencies are worse in the morning.  She states that he is unable to keep conversations as before, cannot remember the dates.  He sleeps well, without REM behavior, sleepwalking, hallucinations or paranoia.  He does not enjoy participating in the activities as before, and this is being go.  His mood is "even".  Denies depression or  irritability.  He likes to continue playing at the computer, especially playing solitary.  He denies living objects in unusual places.  He is independent of bathing and dressing, medications and finances.  However, he forgets to shave and shower more often than before.  He does not drive, his wife takes him to the appointments.  He has chronic numbness in both feet, and he was referred to orthopedics for physical therapy.  He uses a cane.  He denies any headaches, dizziness, diplopia, dysarthria, dysphagia, neck or back pain, other focal numbness or tingling or weakness, bowel or bladder dysfunction, anosmia.  He has mild tremors on the left greater than right hand, but does not affect his activities of daily living.  He denies dropping objects.  He denies any excessive drooling.  Or any other parkinsonian signs. ?  ? Initial visit 12/16/20 Eddie Ferguson is a 81 y.o. male  right-handed man, with vascular risk factors, including hypertension, hyperlipidemia, diabetes, with mild dementia likely due to Alzheimer's disease.  Due to continued progression, and SLUMS score of 16/ 30 in the prior visit, memantine was added on 07/2019 to donepezil 10 mg daily.   He is seen today in follow up for memory loss. This patient is accompanied in the office by his wife who supplements the history.  Previous records as well as any outside records available were reviewed prior to todays visit.   ?  ?He reports his memory  being "fair".  He has more difficulty remembering stories, and his wife states that he has the same questions.  However, she states that he is sharper in the morning, and in the evening is worse at least 2 or 3 times a week.  He continues to live in an independent living community with his wife, but he does not like to "participate much in activities as I used to ".  His mood is "even".  Denies depression or irritability.  He likes to continue playing at the computer, spends significant amount of time there, as  well as playing solitaire.  He sleeps well, denies any sleepwalking, vivid dreams, paranoia or hallucinations. ?He denies leaving objects in unusual places.  He is independent of bathing and dressing, medications and finances.  His appetite is good, denies trouble swallowing.  He does not cook.  He ambulates with the right cane "this is my best friend ", and denies any falls or head trauma recently.  Of note, he tried physical therapy in the recent past, but he according to his wife "he cannot remember the exercises to do, so says he did not enjoy it as much and he does not want to do it anymore ".  He does not drive, his wife takes him to the appointments.  He has chronic numbness in both feet, has a left knee brace.  He denies any headaches, dizziness, diplopia, dysarthria, dysphagia, neck or back pain, focal numbness or tingling or weakness, bowel or bladder dysfunction, anosmia or tremors. ?  ?  ?PREVIOUS MEDICATIONS:  ? ?CURRENT MEDICATIONS:  ?Outpatient Encounter Medications as of 06/16/2021  ?Medication Sig  ? acetaminophen (TYLENOL) 500 MG tablet Take 500 mg by mouth every 6 (six) hours as needed. Reported on 06/16/2015  ? allopurinol (ZYLOPRIM) 100 MG tablet TAKE 1 TABLET BY MOUTH TWICE A DAY  ? amLODipine (NORVASC) 5 MG tablet TAKE 1 TABLET BY MOUTH EVERY DAY  ? benazepril (LOTENSIN) 40 MG tablet Take 1 tablet (40 mg total) by mouth daily.  ? carvedilol (COREG) 25 MG tablet TAKE 1/2 TABLETS BY MOUTH 2 TIMES DAILY WITH A MEAL.  ? chlorthalidone (HYGROTON) 25 MG tablet TAKE 1/2 TO 1 TABLET BY MOUTH DAILY  ? famotidine (PEPCID) 20 MG tablet Take 20 mg by mouth daily.  ? fexofenadine (ALLEGRA) 180 MG tablet Take 180 mg by mouth.  ? fluticasone (FLONASE) 50 MCG/ACT nasal spray Place 2 sprays into both nostrils daily.  ? KLOR-CON M20 20 MEQ tablet TAKE 2 TABLETS (40 MEQ TOTAL) BY MOUTH 2 (TWO) TIMES DAILY.  ? Latanoprost 0.005 % EMUL Apply to eye.  ? levothyroxine (SYNTHROID) 50 MCG tablet TAKE 1 TABLET BY MOUTH  EVERY DAY  ? methocarbamol (ROBAXIN) 750 MG tablet Take 750 mg by mouth 4 (four) times daily.  ? Multiple Vitamin (MULTIVITAMIN) tablet Take 1 tablet by mouth daily.  ? pravastatin (PRAVACHOL) 40 MG tablet TAKE 1 TABLET BY MOUTH EVERY DAY  ? [DISCONTINUED] donepezil (ARICEPT) 10 MG tablet Take 1 tablet (10 mg total) by mouth daily.  ? [DISCONTINUED] memantine (NAMENDA) 10 MG tablet Take 1 tablet twice a day  ? donepezil (ARICEPT) 10 MG tablet Take 1 tablet (10 mg total) by mouth daily.  ? memantine (NAMENDA) 10 MG tablet Take 1 tablet twice a day  ? methylPREDNISolone (MEDROL DOSEPAK) 4 MG TBPK tablet Take 6 on day 1, 5 on day 2, 4 on day 3, 3 on day 4, 2 on day 5, 1 on day 6 (Patient  not taking: Reported on 06/16/2021)  ? ?No facility-administered encounter medications on file as of 06/16/2021.  ? ? ? ?Objective:  ?  ? ?PHYSICAL EXAMINATION:   ? ?VITALS:   ?Vitals:  ? 06/16/21 1112  ?BP: 118/68  ?Pulse: 69  ?SpO2: 93%  ?Weight: 214 lb (97.1 kg)  ?Height: '5\' 10"'$  (1.778 m)  ? ? ?GEN:  The patient appears stated age and is in NAD. ?HEENT:  Normocephalic, atraumatic.  ? ?Neurological examination: ? ?General: NAD, well-groomed, appears stated age. ?Orientation: The patient is alert. Oriented to person, place and date ?Cranial nerves: There is good facial symmetry.The speech is fluent and clear. No aphasia or dysarthria. Fund of knowledge is appropriate. Recent and remote memory are impaired. Attention and concentration are reduced.  Able to name objects and repeat phrases.  Hearing is intact to conversational tone.    ?Sensation: Sensation is intact to light touch throughout ?Motor: Strength is at least antigravity x4. ?Tremors: none  ?DTR's 2/4 in UE/LE  ? ? ?  12/16/2020  ?  1:00 PM 05/10/2018  ?  8:00 AM 09/26/2017  ? 10:00 AM 03/27/2017  ?  9:00 AM  ?Montreal Cognitive Assessment   ?Visuospatial/ Executive (0/5) '1 2 4 4  '$ ?Naming (0/3) '2 3 2 3  '$ ?Attention: Read list of digits (0/2) '2 2 2 2  '$ ?Attention: Read list of letters  (0/1) '1 1 1 1  '$ ?Attention: Serial 7 subtraction starting at 100 (0/3) '3 3 3 3  '$ ?Language: Repeat phrase (0/2) 0 '2 2 2  '$ ?Language : Fluency (0/1) '1 1 1 1  '$ ?Abstraction (0/2) 0 '2 2 2  '$ ?Delayed Recall (0/5)

## 2021-06-21 ENCOUNTER — Encounter: Payer: Self-pay | Admitting: Physician Assistant

## 2021-06-21 ENCOUNTER — Ambulatory Visit: Payer: Medicare HMO | Admitting: Physician Assistant

## 2021-06-21 DIAGNOSIS — M1611 Unilateral primary osteoarthritis, right hip: Secondary | ICD-10-CM

## 2021-06-21 NOTE — Progress Notes (Signed)
HPI: Eddie Ferguson comes in today with right hip pain.  States pain has been ongoing for several months.  He suffers from Alzheimer's and dementia was presents with his wife today.  He is using a rollator to ambulate.  He has had no new injury.  He takes Tylenol for the pain.  He did undergo radiographs of his pelvis and right hip on 05/26/2021.  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  He has moderate arthritis involving the left hip. ? ?Review of systems see HPI otherwise negative ? ?Physical exam: General well-developed well-nourished male in no acute distress. ?Bilateral hips: Virtually no internal or external rotation of the right hip no significant pain with attempts though.  Left hip limited internal/external rotation without pain. ? ?Impression: Right hip end-stage arthritis ? ?Plan: Given the fact that the pain in the hip is not severe per the patient not recommend any surgical or other interventions at this time.  He could try a intra-articular injection in the right hip if his pain became such that it is interfering with his life.  Ultimately if the pain became severe he could consider a right total hip arthroplasty.  He and his wife are happy with just monitoring this for now.  They will follow-up as needed. ?

## 2021-08-17 DIAGNOSIS — E785 Hyperlipidemia, unspecified: Secondary | ICD-10-CM | POA: Diagnosis not present

## 2021-08-17 DIAGNOSIS — E039 Hypothyroidism, unspecified: Secondary | ICD-10-CM | POA: Diagnosis not present

## 2021-08-17 DIAGNOSIS — I10 Essential (primary) hypertension: Secondary | ICD-10-CM | POA: Diagnosis not present

## 2021-08-17 DIAGNOSIS — R69 Illness, unspecified: Secondary | ICD-10-CM | POA: Diagnosis not present

## 2021-08-17 DIAGNOSIS — E876 Hypokalemia: Secondary | ICD-10-CM | POA: Diagnosis not present

## 2021-08-17 DIAGNOSIS — K219 Gastro-esophageal reflux disease without esophagitis: Secondary | ICD-10-CM | POA: Diagnosis not present

## 2021-08-17 DIAGNOSIS — E669 Obesity, unspecified: Secondary | ICD-10-CM | POA: Diagnosis not present

## 2021-08-17 DIAGNOSIS — E119 Type 2 diabetes mellitus without complications: Secondary | ICD-10-CM | POA: Diagnosis not present

## 2021-08-17 DIAGNOSIS — Z6833 Body mass index (BMI) 33.0-33.9, adult: Secondary | ICD-10-CM | POA: Diagnosis not present

## 2021-08-17 DIAGNOSIS — N529 Male erectile dysfunction, unspecified: Secondary | ICD-10-CM | POA: Diagnosis not present

## 2021-08-17 DIAGNOSIS — M109 Gout, unspecified: Secondary | ICD-10-CM | POA: Diagnosis not present

## 2021-08-17 DIAGNOSIS — M1990 Unspecified osteoarthritis, unspecified site: Secondary | ICD-10-CM | POA: Diagnosis not present

## 2021-08-25 ENCOUNTER — Ambulatory Visit (INDEPENDENT_AMBULATORY_CARE_PROVIDER_SITE_OTHER): Payer: Medicare HMO | Admitting: Podiatry

## 2021-08-25 ENCOUNTER — Encounter: Payer: Self-pay | Admitting: Podiatry

## 2021-08-25 DIAGNOSIS — Q828 Other specified congenital malformations of skin: Secondary | ICD-10-CM

## 2021-08-25 DIAGNOSIS — N183 Chronic kidney disease, stage 3 unspecified: Secondary | ICD-10-CM

## 2021-08-25 DIAGNOSIS — M79674 Pain in right toe(s): Secondary | ICD-10-CM | POA: Diagnosis not present

## 2021-08-25 DIAGNOSIS — M79675 Pain in left toe(s): Secondary | ICD-10-CM | POA: Diagnosis not present

## 2021-08-25 DIAGNOSIS — N1831 Chronic kidney disease, stage 3a: Secondary | ICD-10-CM | POA: Diagnosis not present

## 2021-08-25 DIAGNOSIS — E1122 Type 2 diabetes mellitus with diabetic chronic kidney disease: Secondary | ICD-10-CM

## 2021-08-25 DIAGNOSIS — B351 Tinea unguium: Secondary | ICD-10-CM | POA: Diagnosis not present

## 2021-08-25 NOTE — Progress Notes (Signed)
This patient returns to my office for at risk foot care.  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  This patient is unable to cut nails himself since the patient cannot reach his nails.These nails are painful walking and wearing shoes. He also has painful callus on both feet which are not painful. This callus is painful walking. He presents to the office with his wife. This patient presents for at risk foot care today.  General Appearance  Alert, conversant and in no acute stress.  Vascular  Dorsalis pedis and posterior tibial  pulses are palpable  bilaterally.  Capillary return is within normal limits  bilaterally. Temperature is within normal limits  bilaterally.  Neurologic  Senn-Weinstein monofilament wire test within normal limits  bilaterally. Muscle power within normal limits bilaterally.  Nails Thick disfigured discolored nails with subungual debris  from hallux to fifth toes bilaterally. No evidence of bacterial infection or drainage bilaterally.  Orthopedic  No limitations of motion  feet .  No crepitus or effusions noted.  No bony pathology or digital deformities noted.  Skin  normotropic skin with no porokeratosis noted bilaterally.  No signs of infections or ulcers noted.   Callus sub 5th met B/L  symptomatic.  Onychomycosis  Pain in right toes  Pain in toes Callus sub 5th right foot.  Consent was obtained for treatment procedures.   Mechanical debridement of nails 1-5  bilaterally performed with a nail nipper.  Filed with dremel without incident.   Debride callus sub 5th met right foot with # 15 blade.   Return office visit    3 months                  Told patient to return for periodic foot care and evaluation due to potential at risk complications.   Gardiner Barefoot DPM

## 2021-10-07 ENCOUNTER — Other Ambulatory Visit: Payer: Self-pay | Admitting: Family Medicine

## 2021-11-26 ENCOUNTER — Encounter: Payer: Self-pay | Admitting: Family Medicine

## 2021-11-26 ENCOUNTER — Ambulatory Visit (INDEPENDENT_AMBULATORY_CARE_PROVIDER_SITE_OTHER): Payer: Medicare HMO | Admitting: Family Medicine

## 2021-11-26 ENCOUNTER — Other Ambulatory Visit (INDEPENDENT_AMBULATORY_CARE_PROVIDER_SITE_OTHER): Payer: Medicare HMO

## 2021-11-26 VITALS — BP 120/60 | HR 69 | Temp 98.0°F | Ht 70.0 in | Wt 217.4 lb

## 2021-11-26 DIAGNOSIS — I10 Essential (primary) hypertension: Secondary | ICD-10-CM | POA: Diagnosis not present

## 2021-11-26 DIAGNOSIS — E785 Hyperlipidemia, unspecified: Secondary | ICD-10-CM | POA: Diagnosis not present

## 2021-11-26 DIAGNOSIS — N1831 Chronic kidney disease, stage 3a: Secondary | ICD-10-CM | POA: Diagnosis not present

## 2021-11-26 DIAGNOSIS — N183 Chronic kidney disease, stage 3 unspecified: Secondary | ICD-10-CM

## 2021-11-26 DIAGNOSIS — E039 Hypothyroidism, unspecified: Secondary | ICD-10-CM

## 2021-11-26 DIAGNOSIS — E1122 Type 2 diabetes mellitus with diabetic chronic kidney disease: Secondary | ICD-10-CM | POA: Diagnosis not present

## 2021-11-26 LAB — COMPREHENSIVE METABOLIC PANEL
ALT: 19 U/L (ref 0–53)
AST: 18 U/L (ref 0–37)
Albumin: 3.7 g/dL (ref 3.5–5.2)
Alkaline Phosphatase: 62 U/L (ref 39–117)
BUN: 17 mg/dL (ref 6–23)
CO2: 32 mEq/L (ref 19–32)
Calcium: 9.4 mg/dL (ref 8.4–10.5)
Chloride: 100 mEq/L (ref 96–112)
Creatinine, Ser: 1.37 mg/dL (ref 0.40–1.50)
GFR: 47.7 mL/min — ABNORMAL LOW (ref >60.00)
Glucose, Bld: 199 mg/dL — ABNORMAL HIGH (ref 70–99)
Potassium: 3.3 mEq/L — ABNORMAL LOW (ref 3.5–5.1)
Sodium: 140 mEq/L (ref 135–145)
Total Bilirubin: 0.8 mg/dL (ref 0.2–1.2)
Total Protein: 5.9 g/dL — ABNORMAL LOW (ref 6.0–8.3)

## 2021-11-26 LAB — MICROALBUMIN / CREATININE URINE RATIO
Creatinine,U: 199.6 mg/dL
Microalb Creat Ratio: 0.4 mg/g (ref 0.0–30.0)
Microalb, Ur: <0.7 mg/dL (ref 0.0–1.9)

## 2021-11-26 LAB — TSH: TSH: 2.87 u[IU]/mL (ref 0.35–5.50)

## 2021-11-26 NOTE — Patient Instructions (Addendum)
Let us know when you have gotten your flu shot.   Please stop by lab before you go If you have mychart- we will send your results within 3 business days of [place] receiving them.  If you do not have mychart- we will call you about results within 5 business days of [place] receiving them.  *please also note that you will see labs on mychart as soon as they post. I will later go in and write notes on them- will say "notes from Dr. Yong Channel"   Recommended follow up: Return in about 6 months (around 05/27/2022) for physical or sooner if needed.Schedule b4 you leave.

## 2021-11-26 NOTE — Progress Notes (Signed)
Phone [phone] In person visit   Subjective:   Eddie Ferguson is a 81 y.o. year old very pleasant male patient who presents for/with See problem oriented charting Chief Complaint  Patient presents with   Follow-up   Diabetes   Hypertension   Past Medical History-  Patient Active Problem List   Diagnosis Date Noted   Dementia due to Alzheimer's disease ([place]) 06/16/2015    Priority: High   History of prostate cancer-follows with Dr. Alinda [facility] of [facility] 05/28/2014    Priority: High   Type 2 diabetes mellitus with diabetic chronic kidney disease ([place]) 04/23/2014    Priority: High   Aortic atherosclerosis ([place]) 01/05/2018    Priority: Medium    CKD (chronic kidney disease), stage III ([place]) 04/18/2017    Priority: Medium    Gout 07/09/2015    Priority: Medium    Essential hypertension 04/23/2014    Priority: Medium    Hyperlipidemia 04/23/2014    Priority: Medium    Hypothyroidism 04/23/2014    Priority: Medium    Adenomatous colon polyp 05/28/2014    Priority: Low   Osteoarthritis 05/28/2014    Priority: Low   Erectile dysfunction 05/28/2014    Priority: Low   Basal cell carcinoma 05/28/2014    Priority: Low   GERD (gastroesophageal reflux disease) 04/23/2014    Priority: Low   Allergic rhinitis 04/23/2014    Priority: Low   Glaucoma 04/23/2014    Priority: Low   History of nonmelanoma skin cancer 11/18/2012    Priority: Low   Pain due to onychomycosis of toenails of both feet 08/17/2020   Porokeratosis 08/17/2020   Positive FIT (fecal immunochemical test) 06/17/2020   Intermittent diarrhea 06/17/2020   History of shingles 04/24/2011    Medications- reviewed and updated Current Outpatient Medications  Medication Sig Dispense Refill   acetaminophen (TYLENOL) 500 MG tablet Take 500 mg by mouth every 6 (six) hours as needed. Reported on 06/16/2015     allopurinol (ZYLOPRIM) 100 MG tablet TAKE 1 TABLET BY MOUTH TWICE A DAY 180 tablet 3   amLODipine  (NORVASC) 5 MG tablet TAKE 1 TABLET BY MOUTH EVERY DAY 90 tablet 1   benazepril (LOTENSIN) 40 MG tablet Take 1 tablet (40 mg total) by mouth daily. 90 tablet 3   carvedilol (COREG) 25 MG tablet TAKE 1/2 TABLETS BY MOUTH 2 TIMES DAILY WITH A MEAL. 90 tablet 3   chlorthalidone (HYGROTON) 25 MG tablet TAKE 1/2 TO 1 TABLET BY MOUTH DAILY 90 tablet 1   donepezil (ARICEPT) 10 MG tablet Take 1 tablet (10 mg total) by mouth daily. 90 tablet 3   famotidine (PEPCID) 20 MG tablet Take 20 mg by mouth daily.     fexofenadine (ALLEGRA) 180 MG tablet Take 180 mg by mouth.     fluticasone (FLONASE) 50 MCG/ACT nasal spray Place 2 sprays into both nostrils daily. 48 g 3   KLOR-CON M20 20 MEQ tablet TAKE 2 TABLETS BY MOUTH 2 TIMES DAILY. 360 tablet 3   levothyroxine (SYNTHROID) 50 MCG tablet TAKE 1 TABLET BY MOUTH EVERY DAY 90 tablet 3   memantine (NAMENDA) 10 MG tablet Take 1 tablet twice a day 180 tablet 3   methocarbamol (ROBAXIN) 750 MG tablet Take 750 mg by mouth 4 (four) times daily.     Multiple Vitamin (MULTIVITAMIN) tablet Take 1 tablet by mouth daily.     pravastatin (PRAVACHOL) 40 MG tablet TAKE 1 TABLET BY MOUTH EVERY DAY 90 tablet 3   No  current facility-administered medications for this visit.     Objective:  BP 120/60   Pulse 69   Temp 98 F (36.7 C)   Ht '5\' 10"'$  (1.778 m)   Wt 217 lb 6.4 oz (98.6 kg)   SpO2 95%   BMI 31.19 kg/m  Gen: NAD, resting comfortably CV: RRR no murmurs rubs or gallops Lungs: CTAB no crackles, wheeze, rhonchi Abdomen: soft/nontender/nondistended Ext: trace to 1+ edema R> L Skin: warm, dry Neuro: Looks to wife for answers at times, walks with walker    Assessment and Plan   %history of prostate cancer- follows with Dr. Alinda Money after external beam therapy 2016. Original diagnosis 2013 and had active surveillance at first -urology visit also was in April- now yearly planned  # Dementia- sees Dr. Delice Lesch S:medication:  aricept and namenda -ongoing  progression- seen in April with neurology and has visit next week A/P: expected ongoing progression - continue current meds  # Diabetes S: Medication:Diet controlled Lab Results  Component Value Date   HGBA1C 6.7 (A) 06/01/2021   HGBA1C 6.5 11/19/2020   HGBA1C 6.4 05/07/2020   A/P: hopefully stable- update a1c today. No meds for now  #hypertension #CKD III- GFR typically 1.2-1.4 and most recently  S: medication: Benazepril 40 mg, chlorthalidone 25 mg, amlodipine 5 mg, carvedilol 25 mg twice daily.  Does need potassium with chlorthalidone  A/P: Blood pressure is well controlled-continue current medication.  Hopefully renal function is stable-update CMP with labs today.  #hyperlipidemia with aortic atherosclerosis S: Medication:Pravastatin 40 mg Lab Results  Component Value Date   CHOL 137 05/26/2021   HDL 54.10 05/26/2021   LDLCALC 60 05/26/2021   LDLDIRECT 90.0 09/28/2016   TRIG 114.0 05/26/2021   CHOLHDL 3 05/26/2021  A/P: Looked excellent on last check-continue current medication-at goal for someone with aortic atherosclerosis with LDL 70 or less at 60  #hypothyroidism S: compliant On thyroid medication-levothyroxine 50 mcg  Lab Results  Component Value Date   TSH 4.94 05/26/2021  A/P:hopefully stable- update tsh today. Continue current meds for now    #Gout S: 0 flares allopurinol 100 mg despite uric acid being slightly above goal Lab Results  Component Value Date   LABURIC 6.7 05/26/2021  A/P:doing well with meds even with uric acid being slightly high- does love shrimp but tames red meat intake   #Diarrhea/loose stools-prior fecal occult positive stools in 2020-possibly be hemorrhoids or radiation proctitis per GI-they recommended discontinuing any further fecal immunochemistry studies.  Made joint decision not to proceed with colonoscopy with GI.  Pretty much daily issues at this point- loose but without urgency  #HM- flu shot later in September  planned -considering updated covid shot  Recommended follow up: Return in about 6 months (around 05/27/2022) for physical or sooner if needed.Schedule b4 you leave. Future Appointments  Date Time Provider [place]  11/29/2021  1:30 PM Gardiner Barefoot, DPM [facility]  12/17/2021 11:00 AM Shawn Route, Coralee Pesa, PA-C [facility] None  03/15/2022 11:00 AM [facility]   Lab/Order associations:   ICD-10-CM   1. Type 2 diabetes mellitus with stage 3a chronic kidney disease, without long-term current use of insulin (HCC)  E11.22 Microalbumin / creatinine urine ratio   N18.31 Comprehensive metabolic panel    Hemoglobin A1c    2. Essential hypertension  I10     3. Stage 3 chronic kidney disease, unspecified whether stage 3a or 3b CKD (HCC)  N18.30     4. Hypothyroidism, unspecified type  E03.9 TSH    5. Hyperlipidemia, unspecified hyperlipidemia type  E78.5       No orders of the defined types were placed in this encounter.   Return precautions advised.  Garret Reddish, MD

## 2021-11-29 ENCOUNTER — Ambulatory Visit: Payer: Medicare HMO | Admitting: Podiatry

## 2021-11-29 ENCOUNTER — Encounter: Payer: Self-pay | Admitting: Podiatry

## 2021-11-29 ENCOUNTER — Other Ambulatory Visit: Payer: Self-pay | Admitting: Family Medicine

## 2021-11-29 DIAGNOSIS — M79674 Pain in right toe(s): Secondary | ICD-10-CM | POA: Diagnosis not present

## 2021-11-29 DIAGNOSIS — N1831 Chronic kidney disease, stage 3a: Secondary | ICD-10-CM

## 2021-11-29 DIAGNOSIS — M79675 Pain in left toe(s): Secondary | ICD-10-CM

## 2021-11-29 DIAGNOSIS — B351 Tinea unguium: Secondary | ICD-10-CM

## 2021-11-29 DIAGNOSIS — E1122 Type 2 diabetes mellitus with diabetic chronic kidney disease: Secondary | ICD-10-CM

## 2021-11-29 DIAGNOSIS — N183 Chronic kidney disease, stage 3 unspecified: Secondary | ICD-10-CM

## 2021-11-29 LAB — HEMOGLOBIN A1C: Hgb A1c MFr Bld: 7.6 % — ABNORMAL HIGH (ref 4.6–6.5)

## 2021-11-29 NOTE — Progress Notes (Signed)
This patient returns to my office for at risk foot care.  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  This patient is unable to cut nails himself since the patient cannot reach his nails.These nails are painful walking and wearing shoes. He also has painful callus on both feet which are not painful. This callus is painful walking. He presents to the office with his wife. This patient presents for at risk foot care today.  General Appearance  Alert, conversant and in no acute stress.  Vascular  Dorsalis pedis and posterior tibial  pulses are palpable  bilaterally.  Capillary return is within normal limits  bilaterally. Temperature is within normal limits  bilaterally.  Neurologic  Senn-Weinstein monofilament wire test within normal limits  bilaterally. Muscle power within normal limits bilaterally.  Nails Thick disfigured discolored nails with subungual debris  from hallux to fifth toes bilaterally. No evidence of bacterial infection or drainage bilaterally.  Orthopedic  No limitations of motion  feet .  No crepitus or effusions noted.  No bony pathology or digital deformities noted.  Skin  normotropic skin with no porokeratosis noted bilaterally.  No signs of infections or ulcers noted.   Callus sub 5th met B/L  symptomatic.  Onychomycosis  Pain in right toes  Pain in toes Callus sub 5th right foot.  Consent was obtained for treatment procedures.   Mechanical debridement of nails 1-5  bilaterally performed with a nail nipper.  Filed with dremel without incident.   Debride callus sub 5th met right foot with # 15 blade.   Return office visit    3 months                  Told patient to return for periodic foot care and evaluation due to potential at risk complications.   Shawnya Mayor DPM   

## 2021-12-01 ENCOUNTER — Telehealth: Payer: Self-pay | Admitting: Family Medicine

## 2021-12-01 NOTE — Telephone Encounter (Signed)
Patient's wife [name] requests to be called at ph# [phone] re: medications that were prescribed that are either adding medications or changing medications (seen on MyChart message)

## 2021-12-02 NOTE — Telephone Encounter (Signed)
Called and lm for [employer].

## 2021-12-03 ENCOUNTER — Other Ambulatory Visit: Payer: Self-pay

## 2021-12-03 ENCOUNTER — Telehealth: Payer: Self-pay | Admitting: Family Medicine

## 2021-12-03 MED ORDER — METFORMIN HCL 500 MG PO TABS: 500.0000 mg | ORAL_TABLET | Freq: Every day | ORAL | 3 refills | Status: DC

## 2021-12-03 NOTE — Telephone Encounter (Signed)
Caller States: -Pharmacy does not have Metformin, previously mentioned in lab note. -"In urine lab results" mention of "take one potassium".  -Pt currently taking two potassium tables AM, then PM; 4 total.   Caller asks: -Is metformin going to be called in or does she need to give consent? -should pt take one extra potassium or change daily dosage to 5 pills?   Caller requests: -call back to her directly.

## 2021-12-03 NOTE — Telephone Encounter (Signed)
Called and spoke with [name] and message given.

## 2021-12-03 NOTE — Telephone Encounter (Signed)
Metformin sent in, see below regarding potassium.

## 2021-12-17 ENCOUNTER — Encounter: Payer: Self-pay | Admitting: Physician Assistant

## 2021-12-17 ENCOUNTER — Ambulatory Visit: Payer: Medicare HMO | Admitting: Physician Assistant

## 2021-12-17 VITALS — BP 133/74 | HR 69 | Resp 18 | Ht 68.0 in | Wt 220.0 lb

## 2021-12-17 DIAGNOSIS — F03A0 Unspecified dementia, mild, without behavioral disturbance, psychotic disturbance, mood disturbance, and anxiety: Secondary | ICD-10-CM

## 2021-12-17 DIAGNOSIS — G309 Alzheimer's disease, unspecified: Secondary | ICD-10-CM

## 2021-12-17 DIAGNOSIS — R69 Illness, unspecified: Secondary | ICD-10-CM | POA: Diagnosis not present

## 2021-12-17 DIAGNOSIS — F0280 Dementia in other diseases classified elsewhere without behavioral disturbance: Secondary | ICD-10-CM | POA: Diagnosis not present

## 2021-12-17 MED ORDER — DONEPEZIL HCL 10 MG PO TABS: 10.0000 mg | ORAL_TABLET | Freq: Every day | ORAL | 3 refills | Status: DC

## 2021-12-17 MED ORDER — MEMANTINE HCL 10 MG PO TABS: ORAL_TABLET | ORAL | 3 refills | Status: DC

## 2021-12-17 NOTE — Progress Notes (Signed)
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 Eddie Ferguson is a very pleasant 81 y.o. RH male with a history of hypertension, hyperlipidemia, diabetes, and a history of mild dementia likely due to Alzheimer's disease seen today in follow up for memory loss. Patient is currently on memantine 10 mg bid and donepezil 10 mg daily, tolerating well.  MMSE today is 24/30.  Overall, he is clinically stable from the cognitive standpoint.     Follow up in 6 months. Continue donepezil 10 mg daily. Side effects were discussed  Continue Memantine 10 mg twice daily. Side effects were discussed  Recommend good control of cardiovascular risk factors.   Continue to increase mobility, continue to use a walker for stability.   Subjective:    This patient is accompanied in the office by his wife who supplements the history.  Previous records as well as any outside records available were reviewed prior to todays visit. Last  seen on 06/17/21 at which time MMSE  was 24/30   Any changes in memory since last visit?  "So, so ". His wife reports that his short-term memory may be diminishing. He continues to forget recent conversations, "more than before, and cannot remember dates ".  Memory difficulties are worse in the morning.  However for activities, he thrives in the morning, so he tries to complete the projects then.  He enjoys playing on the computer, especially playing solitaire, and likes to watch You Tube and watching sports. repeats oneself?  Endorsed, slightly worse  Disoriented when walking into a room?  Patient denies "He just forgets why he is there for" Leaving objects in unusual places?  Patient denies, but wife reports that "doesn't put things where they were supposed to be " Ambulates  with difficulty?   Uses a walker for balance and strength, no longer doing  PT due to arthritis Recent falls?  Patient denies   Any head injuries?  Patient denies   History  of seizures?   Patient denies   Wandering behavior?  Patient denies   Patient drives?   Patient no longer drives  Any mood changes since last visit?  Patient denies.  His mood is "even tempered". Any worsening depression?:  Patient denies   Hallucinations?  Patient denies   Paranoia?  Patient denies   Patient reports that sleeps well without vivid dreams, REM behavior or sleepwalking   History of sleep apnea?  Patient denies   Any hygiene concerns?  As prior, he needs to be reminded to take a shower or shaving. Independent of bathing and dressing?  Endorsed except needs help to tie shoes Does the patient needs help with medications?  Wife in charge  Who is in charge of the finances? He  is in charge but wife does the balance  Any changes in appetite?  "Never had a great appetite, he may like sweets more than before " Patient have trouble swallowing? Patient denies   Does the patient cook?  Patient denies   Any headaches?  Patient denies   Double vision? Patient denies  but needs to have eye doctor evaluation because he has episodes of decreased vision. To follow up ophthalmology soon  Chronic numbness both feet "legs itch at night, tried hydrocortisone and helped some ".  Chronic back pain Patient denies   Unilateral weakness?  Patient denies   Any tremors?  Very mild, chronic left greater than right mild tremors in the hands, without affecting his  ADLs.  He denies dropping of objects or excessive drooling or other parkinsonian signs  Any history of anosmia?  Patient denies   Any incontinence of urine?  Patient denies, occasional leakage   Any bowel dysfunction?    "spends more time in the bathroom, since he is on metformin, causing him to have some diarrhea ".  Patient lives with: [facility] [address] community in [place]    Initial visit 12/16/20 Eddie Ferguson. Saavedra is a 81 y.o. male  right-handed man, with vascular risk factors, including hypertension, hyperlipidemia, diabetes, with mild dementia  likely due to Alzheimer's disease.  Due to continued progression, and SLUMS score of 16/ 30 in the prior visit, memantine was added on 07/2019 to donepezil 10 mg daily.   He is seen today in follow up for memory loss. This patient is accompanied in the office by his wife who supplements the history.  Previous records as well as any outside records available were reviewed prior to todays visit.     He reports his memory being "fair".  He has more difficulty remembering stories, and his wife states that he has the same questions.  However, she states that he is sharper in the morning, and in the evening is worse at least 2 or 3 times a week.  He continues to live in an independent living community with his wife, but he does not like to "participate much in activities as I used to ".  His mood is "even".  Denies depression or irritability.  He likes to continue playing at the computer, spends significant amount of time there, as well as playing solitaire.  He sleeps well, denies any sleepwalking, vivid dreams, paranoia or hallucinations. He denies leaving objects in unusual places.  He is independent of bathing and dressing, medications and finances.  His appetite is good, denies trouble swallowing.  He does not cook.  He ambulates with the right cane "this is my best friend ", and denies any falls or head trauma recently.  Of note, he tried physical therapy in the recent past, but he according to his wife "he cannot remember the exercises to do, so says he did not enjoy it as much and he does not want to do it anymore ".  He does not drive, his wife takes him to the appointments.  He has chronic numbness in both feet, has a left knee brace.  He denies any headaches, dizziness, diplopia, dysarthria, dysphagia, neck or back pain, focal numbness or tingling or weakness, bowel or bladder dysfunction, anosmia or tremors.   PREVIOUS MEDICATIONS:   CURRENT MEDICATIONS:  Outpatient Encounter Medications as of  12/17/2021  Medication Sig   acetaminophen (TYLENOL) 500 MG tablet Take 500 mg by mouth every 6 (six) hours as needed. Reported on 06/16/2015   allopurinol (ZYLOPRIM) 100 MG tablet TAKE 1 TABLET BY MOUTH TWICE A DAY   amLODipine (NORVASC) 5 MG tablet TAKE 1 TABLET BY MOUTH EVERY DAY   benazepril (LOTENSIN) 40 MG tablet Take 1 tablet (40 mg total) by mouth daily.   carvedilol (COREG) 25 MG tablet TAKE 1/2 TABLETS BY MOUTH 2 TIMES DAILY WITH A MEAL.   chlorthalidone (HYGROTON) 25 MG tablet TAKE 1/2 TO 1 TABLET BY MOUTH DAILY   famotidine (PEPCID) 20 MG tablet Take 20 mg by mouth daily.   fexofenadine (ALLEGRA) 180 MG tablet Take 180 mg by mouth.   fluticasone (FLONASE) 50 MCG/ACT nasal spray Place 2 sprays into both nostrils daily.   KLOR-CON M20 20  MEQ tablet TAKE 2 TABLETS BY MOUTH 2 TIMES DAILY.   levothyroxine (SYNTHROID) 50 MCG tablet TAKE 1 TABLET BY MOUTH EVERY DAY   metFORMIN (GLUCOPHAGE) 500 MG tablet Take 1 tablet (500 mg total) by mouth daily with breakfast.   methocarbamol (ROBAXIN) 750 MG tablet Take 750 mg by mouth 4 (four) times daily.   Multiple Vitamin (MULTIVITAMIN) tablet Take 1 tablet by mouth daily.   pravastatin (PRAVACHOL) 40 MG tablet TAKE 1 TABLET BY MOUTH EVERY DAY   [DISCONTINUED] donepezil (ARICEPT) 10 MG tablet Take 1 tablet (10 mg total) by mouth daily.   [DISCONTINUED] memantine (NAMENDA) 10 MG tablet Take 1 tablet twice a day   donepezil (ARICEPT) 10 MG tablet Take 1 tablet (10 mg total) by mouth daily.   memantine (NAMENDA) 10 MG tablet Take 1 tablet twice a day   No facility-administered encounter medications on file as of 12/17/2021.       12/17/2021   11:00 AM 06/16/2021   11:00 AM 06/08/2017    8:46 AM  MMSE - Mini Mental State Exam  Not completed:   Refused  Orientation to time 3 5   Orientation to Place 4 5   Registration 3 3   Attention/ Calculation 5 3   Recall 0 0   Language- name 2 objects 2 2   Language- repeat 1 1   Language- follow 3 step  command 3 3   Language- read & follow direction 1 1   Write a sentence 1 1   Copy design 1 0   Total score 24 24       12/16/2020    1:00 PM 05/10/2018    8:00 AM 09/26/2017   10:00 AM 03/27/2017    9:00 AM  Montreal Cognitive Assessment   Visuospatial/ Executive (0/5) '1 2 4 4  '$ Naming (0/3) '2 3 2 3  '$ Attention: Read list of digits (0/2) '2 2 2 2  '$ Attention: Read list of letters (0/1) '1 1 1 1  '$ Attention: Serial 7 subtraction starting at 100 (0/3) '3 3 3 3  '$ Language: Repeat phrase (0/2) 0 '2 2 2  '$ Language : Fluency (0/1) '1 1 1 1  '$ Abstraction (0/2) 0 '2 2 2  '$ Delayed Recall (0/5) 0 0 0 0  Orientation (0/6) '2 6 6 6  '$ Total '12 22 23 24  '$ Adjusted Score (based on education) 12       Objective:     PHYSICAL EXAMINATION:    VITALS:   Vitals:   12/17/21 1059  BP: 133/74  Pulse: 69  Resp: 18  SpO2: 95%  Weight: 220 lb (99.8 kg)  Height: '5\' 8"'$  (1.727 m)    GEN:  The patient appears stated age and is in NAD. HEENT:  Normocephalic, atraumatic.   Neurological examination:  General: NAD, well-groomed, appears stated age. Orientation: The patient is alert. Oriented to person, place and, the year is 2022. Cranial nerves: There is good facial symmetry.The speech is fluent and clear. No aphasia or dysarthria. Fund of knowledge is appropriate. Recent and remote memory are impaired. Attention and concentration are reduced.  Able to name objects and repeat phrases.  Hearing is intact to conversational tone.    Sensation: Sensation is intact to light touch throughout Motor: Strength is at least antigravity x4. DTR's 2/4 in UE/LE     Movement examination: Tone: There is normal tone in the UE/LE Abnormal movements: Mild left greater than right hand resting tremor. No myoclonus.  No asterixis.   Coordination:  There is no decremation with RAM's. Normal finger to nose  Gait and Station: The patient has mild difficulty arising out of a deep-seated chair without the use of the hands, uses a walker  to ambulate,  patient's stride length is good.  Gait is cautious and narrow.    Thank you for allowing [place] the opportunity to participate in the care of this nice patient. Please do not hesitate to contact us for any questions or concerns.   Total time spent on today's visit was 32 minutes dedicated to this patient today, preparing to see patient, examining the patient, ordering tests and/or medications and counseling the patient, documenting clinical information in the EHR or other health record, independently interpreting results and communicating results to the patient/family, discussing treatment and goals, answering patient's questions and coordinating care.  Cc:  Marin Olp, MD  Sharene Butters 12/17/2021 11:26 AM

## 2021-12-17 NOTE — Patient Instructions (Addendum)
It was a pleasure to see you today at our office.   Recommendations:  Follow up in 6  months Continue donepezil 10 mg daily. Side effects were discussed  Continue Memantine 10 mg twice daily.Side effects were discussed   Whom to call:  Memory  decline, memory medications: Call our office [phone]   For psychiatric meds, mood meds: Please have your primary care physician manage these medications.   Counseling regarding caregiver distress, including caregiver depression, anxiety and issues regarding community resources, adult day care programs, adult living facilities, or memory care questions:   Feel free to contact [place], Social Worker at [phone]   For assessment of decision of mental capacity and competency:  Call Dr. Anthoney Harada, geriatric psychiatrist at [phone]  For guidance in geriatric dementia issues please call [facility] [phone]  For guidance regarding [facility] and if placement were needed at the facility, contact Arnell Asal, Social Worker tel: [phone]  If you have any severe symptoms of a stroke, or other severe issues such as confusion,severe chills or fever, etc call 911 or go to the ER as you may need to be evaluated further   Feel free to visit Facebook page " [email or web address]" for tips of how to care for people with memory problems.      RECOMMENDATIONS FOR ALL PATIENTS WITH MEMORY PROBLEMS: 1. Continue to exercise (Recommend 30 minutes of walking everyday, or 3 hours every week) 2. Increase social interactions - continue going to [place] and enjoy social gatherings with friends and family 3. Eat healthy, avoid fried foods and eat more fruits and vegetables 4. Maintain adequate blood pressure, blood sugar, and blood cholesterol level. Reducing the risk of stroke and cardiovascular disease also helps promoting better memory. 5. Avoid stressful situations. Live a simple life and avoid aggravations.  Organize your time and prepare for the next day in anticipation. 6. Sleep well, avoid any interruptions of sleep and avoid any distractions in the bedroom that may interfere with adequate sleep quality 7. Avoid sugar, avoid sweets as there is a strong link between excessive sugar intake, diabetes, and cognitive impairment We discussed the Mediterranean diet, which has been shown to help patients reduce the risk of progressive memory disorders and reduces cardiovascular risk. This includes eating fish, eat fruits and green leafy vegetables, nuts like almonds and hazelnuts, walnuts, and also use olive oil. Avoid fast foods and fried foods as much as possible. Avoid sweets and sugar as sugar use has been linked to worsening of memory function.  There is always a concern of gradual progression of memory problems. If this is the case, then we may need to adjust level of care according to patient needs. Support, both to the patient and caregiver, should then be put into place.    The Alzheimer's Association is here all day, every day for people facing Alzheimer's disease through our free 24/7 Helpline: [phone]. The Helpline provides reliable information and support to all those who need assistance, such as individuals living with memory loss, Alzheimer's or other dementia, caregivers, health care professionals and the public.  Our highly trained and knowledgeable staff can help you with: Understanding memory loss, dementia and Alzheimer's  Medications and other treatment options  General information about aging and brain health  Skills to provide quality care and to find the best care from professionals  Legal, financial and living-arrangement decisions Our Helpline also features: Confidential care consultation provided by master's level clinicians who can help  with decision-making support, crisis assistance and education on issues families face every day  Help in a caller's preferred language using  our translation service that features more than 200 languages and dialects  Referrals to local community programs, services and ongoing support     FALL PRECAUTIONS: Be cautious when walking. Scan the area for obstacles that may increase the risk of trips and falls. When getting up in the mornings, sit up at the edge of the bed for a few minutes before getting out of bed. Consider elevating the bed at the head end to avoid drop of blood pressure when getting up. Walk always in a well-lit room (use night lights in the walls). Avoid area rugs or power cords from appliances in the middle of the walkways. Use a walker or a cane if necessary and consider physical therapy for balance exercise. Get your eyesight checked regularly.  FINANCIAL OVERSIGHT: Supervision, especially oversight when making financial decisions or transactions is also recommended.  HOME SAFETY: Consider the safety of the kitchen when operating appliances like stoves, microwave oven, and blender. Consider having supervision and share cooking responsibilities until no longer able to participate in those. Accidents with firearms and other hazards in the house should be identified and addressed as well.   ABILITY TO BE LEFT ALONE: If patient is unable to contact 911 operator, consider using LifeLine, or when the need is there, arrange for someone to stay with patients. Smoking is a fire hazard, consider supervision or cessation. Risk of wandering should be assessed by caregiver and if detected at any point, supervision and safe proof recommendations should be instituted.  MEDICATION SUPERVISION: Inability to self-administer medication needs to be constantly addressed. Implement a mechanism to ensure safe administration of the medications.       Mediterranean Diet A Mediterranean diet refers to food and lifestyle choices that are based on the traditions of countries located on the [employer]. This way of eating has been shown  to help prevent certain conditions and improve outcomes for people who have chronic diseases, like kidney disease and heart disease. What are tips for following this plan? Lifestyle  Cook and eat meals together with your family, when possible. Drink enough fluid to keep your urine clear or pale yellow. Be physically active every day. This includes: Aerobic exercise like running or swimming. Leisure activities like gardening, walking, or housework. Get 7-8 hours of sleep each night. If recommended by your health care provider, drink red wine in moderation. This means 1 glass a day for nonpregnant women and 2 glasses a day for men. A glass of wine equals 5 oz (150 mL). Reading food labels  Check the serving size of packaged foods. For foods such as rice and pasta, the serving size refers to the amount of cooked product, not dry. Check the total fat in packaged foods. Avoid foods that have saturated fat or trans fats. Check the ingredients list for added sugars, such as corn syrup. Shopping  At the grocery store, buy most of your food from the areas near the walls of the store. This includes: Fresh fruits and vegetables (produce). Grains, beans, nuts, and seeds. Some of these may be available in unpackaged forms or large amounts (in bulk). Fresh seafood. Poultry and eggs. Low-fat dairy products. Buy whole ingredients instead of prepackaged foods. Buy fresh fruits and vegetables in-season from local farmers markets. Buy frozen fruits and vegetables in resealable bags. If you do not have access to quality fresh seafood, buy  precooked frozen shrimp or canned fish, such as tuna, salmon, or sardines. Buy small amounts of raw or cooked vegetables, salads, or olives from the deli or salad bar at your store. Stock your pantry so you always have certain foods on hand, such as olive oil, canned tuna, canned tomatoes, rice, pasta, and beans. Cooking  Cook foods with extra-virgin olive oil instead of  using butter or other vegetable oils. Have meat as a side dish, and have vegetables or grains as your main dish. This means having meat in small portions or adding small amounts of meat to foods like pasta or stew. Use beans or vegetables instead of meat in common dishes like chili or lasagna. Experiment with different cooking methods. Try roasting or broiling vegetables instead of steaming or sauteing them. Add frozen vegetables to soups, stews, pasta, or rice. Add nuts or seeds for added healthy fat at each meal. You can add these to yogurt, salads, or vegetable dishes. Marinate fish or vegetables using olive oil, lemon juice, garlic, and fresh herbs. Meal planning  Plan to eat 1 vegetarian meal one day each week. Try to work up to 2 vegetarian meals, if possible. Eat seafood 2 or more times a week. Have healthy snacks readily available, such as: Vegetable sticks with hummus. Greek yogurt. Fruit and nut trail mix. Eat balanced meals throughout the week. This includes: Fruit: 2-3 servings a day Vegetables: 4-5 servings a day Low-fat dairy: 2 servings a day Fish, poultry, or lean meat: 1 serving a day Beans and legumes: 2 or more servings a week Nuts and seeds: 1-2 servings a day Whole grains: 6-8 servings a day Extra-virgin olive oil: 3-4 servings a day Limit red meat and sweets to only a few servings a month What are my food choices? Mediterranean diet Recommended Grains: Whole-grain pasta. Brown rice. Bulgar wheat. Polenta. Couscous. Whole-wheat bread. [place]. Vegetables: Artichokes. Beets. Broccoli. Cabbage. Carrots. Eggplant. Green beans. Chard. Kale. Spinach. Onions. Leeks. Peas. Squash. Tomatoes. Peppers. Radishes. Fruits: Apples. Apricots. Avocado. Berries. Bananas. Cherries. Dates. Figs. Grapes. Lemons. Melon. Oranges. Peaches. Plums. Pomegranate. Meats and other protein foods: Beans. Almonds. Sunflower seeds. Pine nuts. Peanuts. [place]. Salmon. Scallops. Shrimp. [place].  Tilapia. Clams. Oysters. Eggs. Dairy: Low-fat milk. Cheese. Greek yogurt. Beverages: Water. Red wine. Herbal tea. Fats and oils: Extra virgin olive oil. Avocado oil. Grape seed oil. Sweets and desserts: [place] yogurt with honey. Baked apples. Poached pears. Trail mix. Seasoning and other foods: Basil. Cilantro. Coriander. Cumin. Mint. Parsley. Sage. Rosemary. Tarragon. Garlic. Oregano. Thyme. Pepper. Balsalmic vinegar. Tahini. Hummus. Tomato sauce. Olives. Mushrooms. Limit these Grains: Prepackaged pasta or rice dishes. Prepackaged cereal with added sugar. Vegetables: Deep fried potatoes (french fries). Fruits: Fruit canned in syrup. Meats and other protein foods: Beef. Pork. Lamb. Poultry with skin. Hot dogs. [place] Salines. Dairy: Ice cream. Sour cream. Whole milk. Beverages: Juice. Sugar-sweetened soft drinks. Beer. Liquor and spirits. Fats and oils: Butter. Canola oil. Vegetable oil. Beef fat (tallow). Lard. Sweets and desserts: Cookies. Cakes. Pies. Candy. Seasoning and other foods: Mayonnaise. Premade sauces and marinades. The items listed may not be a complete list. Talk with your dietitian about what dietary choices are right for you. Summary The Mediterranean diet includes both food and lifestyle choices. Eat a variety of fresh fruits and vegetables, beans, nuts, seeds, and whole grains. Limit the amount of red meat and sweets that you eat. Talk with your health care provider about whether it is safe for you to drink red wine in moderation. This  means 1 glass a day for nonpregnant women and 2 glasses a day for men. A glass of wine equals 5 oz (150 mL). This information is not intended to replace advice given to you by your health care provider. Make sure you discuss any questions you have with your health care provider. Document Released: 10/15/2015 Document Revised: 11/17/2015 Document Reviewed: 10/15/2015 Elsevier Interactive Patient Education  2017 [employer].

## 2022-01-04 ENCOUNTER — Other Ambulatory Visit: Payer: Self-pay | Admitting: Family Medicine

## 2022-01-19 DIAGNOSIS — L814 Other melanin hyperpigmentation: Secondary | ICD-10-CM | POA: Diagnosis not present

## 2022-01-19 DIAGNOSIS — D485 Neoplasm of uncertain behavior of skin: Secondary | ICD-10-CM | POA: Diagnosis not present

## 2022-01-19 DIAGNOSIS — Z85828 Personal history of other malignant neoplasm of skin: Secondary | ICD-10-CM | POA: Diagnosis not present

## 2022-01-19 DIAGNOSIS — D235 Other benign neoplasm of skin of trunk: Secondary | ICD-10-CM | POA: Diagnosis not present

## 2022-01-19 DIAGNOSIS — L821 Other seborrheic keratosis: Secondary | ICD-10-CM | POA: Diagnosis not present

## 2022-01-19 DIAGNOSIS — L570 Actinic keratosis: Secondary | ICD-10-CM | POA: Diagnosis not present

## 2022-02-17 ENCOUNTER — Telehealth: Payer: Self-pay | Admitting: Family Medicine

## 2022-02-17 NOTE — Telephone Encounter (Signed)
Patient's wife [name] requests to be called at ph# [phone] to be advised if Dr. Yong Channel feels it is wise for Patient to have the RSV Vaccine. Patient has an appointment for the RSV Vaccine on 02/23/22

## 2022-02-18 NOTE — Telephone Encounter (Signed)
Called and spoke with pt and advised that Dr. Yong Channel advises pts 60 and over to get the RSV vaccine, they will let us know once they have gotten it so I can update the chart.

## 2022-02-22 ENCOUNTER — Ambulatory Visit: Payer: Self-pay

## 2022-02-22 ENCOUNTER — Encounter: Payer: Self-pay | Admitting: Sports Medicine

## 2022-02-22 ENCOUNTER — Ambulatory Visit: Payer: Medicare HMO | Admitting: Sports Medicine

## 2022-02-22 DIAGNOSIS — M1611 Unilateral primary osteoarthritis, right hip: Secondary | ICD-10-CM

## 2022-02-22 DIAGNOSIS — M25551 Pain in right hip: Secondary | ICD-10-CM | POA: Diagnosis not present

## 2022-02-22 DIAGNOSIS — F0280 Dementia in other diseases classified elsewhere without behavioral disturbance: Secondary | ICD-10-CM | POA: Diagnosis not present

## 2022-02-22 DIAGNOSIS — R69 Illness, unspecified: Secondary | ICD-10-CM | POA: Diagnosis not present

## 2022-02-22 DIAGNOSIS — G309 Alzheimer's disease, unspecified: Secondary | ICD-10-CM

## 2022-02-22 MED ORDER — METHYLPREDNISOLONE ACETATE 40 MG/ML IJ SUSP
40.0000 mg | INTRAMUSCULAR | Status: AC | PRN
  Administered 2022-02-22: 40 mg via INTRA_ARTICULAR

## 2022-02-22 MED ORDER — LIDOCAINE HCL 1 % IJ SOLN
4.0000 mL | INTRAMUSCULAR | Status: AC | PRN
  Administered 2022-02-22: 4 mL

## 2022-02-22 NOTE — Progress Notes (Signed)
Eddie Ferguson. Luecke - 81 y.o. male MRN [id number]  Date of birth: 17-Jul-1940  Office Visit Note: Visit Date: 02/22/2022 PCP: Marin Olp, MD Referred by: Marin Olp, MD  Subjective: Chief Complaint  Patient presents with   Right Hip - Pain   HPI: Eddie Ferguson. Eddie Ferguson "Eddie Ferguson" is a pleasant 81 y.o. male who presents today for acute on chronic right hip pain.  His wife is with him today and she did provide some of the HPI due to patient's advanced dementia.  The wife does state that Eddie Ferguson has had right hip pain for a few years now, but she has noticed him complain about the hip pain more recently over the past few weeks. This morning his pain was increased. They are going to visit their son in [place] and they would him to be feeling well for this trip.  The patient points to pain over the anterior and posterior aspect of the right hip.  He does take Tylenol as needed for his pain.  He reports he has had injections previously, either in the hip or the back and this has helped his pain in the past.  Pertinent ROS were reviewed with the patient and found to be negative unless otherwise specified above in HPI.   Assessment & Plan: Visit Diagnoses:  1. Arthritis of right hip   2. Pain in right hip   3. Dementia due to Alzheimer's disease [facility])    Plan: Reviewed Bill's previous hip x-rays which show severe osteoarthritis of the right hip. He has been experiencing an exacerbation of his right hip OA with increased pain over the past few weeks, limiting some of his mobility. Discussed all treatment options, do not think he is a good surgical candidate at this time for THA. Through shared-decision making, elected to proceed with US-guided IA-hip injection, he tolerated well. He is to use ice and tylenol for any post-injection pain. May continue Tylenol as needed going forward. Can always consider formal PT if not getting the relief we hope with this injection. He will f/u in 3 months; may  return sooner if any issue arise.   Follow-up: As needed; may see in 3 months unless needs to be seen sooner   Meds & Orders: No orders of the defined types were placed in this encounter.   Orders Placed This Encounter  Procedures   Large Joint Inj   US Guided Needle Placement - No Linked Charges     Procedures: Large Joint Inj: R hip joint on 02/22/2022 11:23 AM Indications: pain Details: 22 G 3.5 in needle, ultrasound-guided anterior approach Medications: 4 mL lidocaine 1 %; 40 mg methylPREDNISolone acetate 40 MG/ML Outcome: tolerated well, no immediate complications  Procedure: US-guided intra-articular hip injection, right After discussion on risks/benefits/indications and informed verbal consent was obtained, a timeout was performed. Patient was lying supine on exam table. The hip was cleaned with betadine and alcohol swabs. Then utilizing ultrasound guidance, the patient's femoral head and neck junction was identified and subsequently injected with 4:1 lidocaine:depomedrol via an in-plane approach with ultrasound visualization of the injectate administered into the hip joint. Patient tolerated procedure well without immediate complications.  Procedure, treatment alternatives, risks and benefits explained, specific risks discussed. Consent was given by the patient. Immediately prior to procedure a time out was called to verify the correct patient, procedure, equipment, support staff and site/side marked as required. Patient was prepped and draped in the usual sterile fashion.  Clinical History: No specialty comments available.  He reports that he quit smoking about 21 years ago. His smoking use included cigarettes. He has never used smokeless tobacco.  Recent Labs    05/26/21 1146 06/01/21 1329 11/26/21 1557  HGBA1C  --  6.7* 7.6*  LABURIC 6.7  --   --     Objective:    Physical Exam  Gen: Well-appearing, in no acute distress; non-toxic CV: Well-perfused.  Warm.  Resp: Breathing unlabored on room air; no wheezing. Psych: Fluid speech in conversation; appropriate affect; normal thought process Neuro: Sensation intact throughout. Short-term memory loss (hx of dementia) throughout questioning.   Ortho Exam - Right hip: No redness, swelling or effusion.  There is limited mobility more so specifically internal rotation.  Positive FADIR test.  Neurovascular intact distally.  Able to perform straight leg raise.  Imaging:  *Review of the right hip x-ray from 05/27/2021 was interpreted by myself.  There is near severe arthritis of the right hip narrowing of the articular cartilage.  There is a bone spur off the inferior aspect of the femoral head as well as spurring and degenerative change of the acetabular rim.  DG HIP UNILAT W OR W/O PELVIS 2-3 VIEWS RIGHT CLINICAL DATA:  Hip pain  EXAM: DG HIP (WITH OR WITHOUT PELVIS) 2-3V RIGHT  COMPARISON:  07/12/2018  FINDINGS: SI joints are patent. Pubic symphysis and rami are intact. Mild degenerative changes of the left hip. Moderate severe degenerative changes right hip with bone on bone appearance, mild sclerosis and osteophyte. Findings appear slightly progressed since 2020.  IMPRESSION: 1. Moderate severe arthritis right hip with slight progression since 2020 2. No acute osseous abnormality  Electronically Signed   By: Donavan Foil M.D.   On: 05/29/2021 17:08    Past Medical/Family/Surgical/Social History: Medications & Allergies reviewed per EMR, new medications updated. Patient Active Problem List   Diagnosis Date Noted   Pain due to onychomycosis of toenails of both feet 08/17/2020   Porokeratosis 08/17/2020   Positive FIT (fecal immunochemical test) 06/17/2020   Intermittent diarrhea 06/17/2020   Aortic atherosclerosis ([place]) 01/05/2018   CKD (chronic kidney disease), stage III ([place]) 04/18/2017   Gout 07/09/2015   Dementia due to Alzheimer's disease ([place]) 06/16/2015   Adenomatous  colon polyp 05/28/2014   History of prostate cancer-follows with Dr. Alinda [facility] 05/28/2014   Osteoarthritis 05/28/2014   Erectile dysfunction 05/28/2014   Basal cell carcinoma 05/28/2014   Essential hypertension 04/23/2014   Hyperlipidemia 04/23/2014   GERD (gastroesophageal reflux disease) 04/23/2014   Type 2 diabetes mellitus with diabetic chronic kidney disease ([place]) 04/23/2014   Hypothyroidism 04/23/2014   Allergic rhinitis 04/23/2014   Glaucoma 04/23/2014   History of nonmelanoma skin cancer 11/18/2012   History of shingles 04/24/2011   Past Medical History:  Diagnosis Date   Allergy    Ankle injury 06/01/2001   no surgery- skin debridement    Arthritis    Basal cell carcinoma 05/28/2014   L nasal tip. Mohs Dr. Link Snuffer.     Blood transfusion    2010 because of stomach ulcers   Cataract    bilaterally removed   Diabetes mellitus    GERD (gastroesophageal reflux disease)    Glaucoma    Hyperlipidemia    Hypertension    Hypothyroidism    Pneumonia 04/12/2006   Prostate cancer ([place])    Seasonal allergies    Sinusitis    treated for bacterial infection at least once a year at [facility]  Stomach ulcer 2010   bleeding ulcer result NSAIDS   Family History  Problem Relation Age of Onset   Diabetes Mother    Diabetes Father    Hyperlipidemia Father    Colon polyps Father    Brain cancer Brother        smoker   Dementia Paternal Grandmother    Colon cancer Neg Hx    Esophageal cancer Neg Hx    Stomach cancer Neg Hx    Rectal cancer Neg Hx    Past Surgical History:  Procedure Laterality Date   abdominl abcess     03-15-2011   BACK SURGERY     CATARACT EXTRACTION, BILATERAL     04-07-2014, 03-17-2014   COLONOSCOPY     2012, 5 year repeat   CYSTOURETHROSCOPY  11/11/1998   ESOPHAGOGASTRODUODENOSCOPY  2010   PUD   LUMBAR LAMINECTOMY  2007   [place]   MOHS SURGERY     left side of nose   POLYPECTOMY     PROSTATE BIOPSY     radiation 2016    RECONSTRUCTION TENDON PULLEY W/ TENDON / FASCIAL GRAFT OF HAND / FINGER  1982   rt 3rd finger   RECONSTRUCTION TENDON PULLEY W/ TENDON / FASCIAL GRAFT OF HAND / FINGER  1953   rt 5th finger   UPPER GASTROINTESTINAL ENDOSCOPY     Social History   Occupational History   Occupation: retired  Tobacco Use   Smoking status: Former    Types: Cigarettes    Quit date: 02/03/2001    Years since quitting: 21.0   Smokeless tobacco: Never   Tobacco comments:    intermittent cigar use stopped  Vaping Use   Vaping Use: Never used  Substance and Sexual Activity   Alcohol use: Yes    Alcohol/week: 0.0 standard drinks of alcohol    Comment: occasional beer   Drug use: No   Sexual activity: Not Currently    Partners: Female

## 2022-02-24 ENCOUNTER — Other Ambulatory Visit: Payer: Self-pay | Admitting: Family Medicine

## 2022-03-01 ENCOUNTER — Ambulatory Visit (INDEPENDENT_AMBULATORY_CARE_PROVIDER_SITE_OTHER): Payer: Medicare HMO | Admitting: Podiatry

## 2022-03-01 ENCOUNTER — Encounter: Payer: Self-pay | Admitting: Podiatry

## 2022-03-01 DIAGNOSIS — M79674 Pain in right toe(s): Secondary | ICD-10-CM | POA: Diagnosis not present

## 2022-03-01 DIAGNOSIS — M79675 Pain in left toe(s): Secondary | ICD-10-CM | POA: Diagnosis not present

## 2022-03-01 DIAGNOSIS — B351 Tinea unguium: Secondary | ICD-10-CM | POA: Diagnosis not present

## 2022-03-01 DIAGNOSIS — N183 Chronic kidney disease, stage 3 unspecified: Secondary | ICD-10-CM

## 2022-03-01 DIAGNOSIS — N1831 Chronic kidney disease, stage 3a: Secondary | ICD-10-CM

## 2022-03-01 DIAGNOSIS — E1122 Type 2 diabetes mellitus with diabetic chronic kidney disease: Secondary | ICD-10-CM

## 2022-03-01 DIAGNOSIS — Q828 Other specified congenital malformations of skin: Secondary | ICD-10-CM

## 2022-03-01 NOTE — Progress Notes (Signed)
This patient returns to my office for at risk foot care.  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  This patient is unable to cut nails himself since the patient cannot reach his nails.These nails are painful walking and wearing shoes. Marland Kitchen He presents to the office with his wife. This patient presents for at risk foot care today.  General Appearance  Alert, conversant and in no acute stress.  Vascular  Dorsalis pedis and posterior tibial  pulses are palpable  bilaterally.  Capillary return is within normal limits  bilaterally. Temperature is within normal limits  bilaterally.  Neurologic  Senn-Weinstein monofilament wire test within normal limits  bilaterally. Muscle power within normal limits bilaterally.  Nails Thick disfigured discolored nails with subungual debris  from hallux to fifth toes bilaterally. No evidence of bacterial infection or drainage bilaterally.  Orthopedic  No limitations of motion  feet .  No crepitus or effusions noted.  No bony pathology or digital deformities noted.  Skin  normotropic skin with no porokeratosis noted bilaterally.  No signs of infections or ulcers noted.   Callus sub 5th met B/L  asymptomatic.  Onychomycosis  Pain in right toes  Pain in toes Callus sub 5th right foot.  Consent was obtained for treatment procedures.   Mechanical debridement of nails 1-5  bilaterally performed with a nail nipper.  Filed with dremel without incident.     Return office visit    3 months                  Told patient to return for periodic foot care and evaluation due to potential at risk complications.   Gardiner Barefoot DPM

## 2022-03-15 ENCOUNTER — Ambulatory Visit (INDEPENDENT_AMBULATORY_CARE_PROVIDER_SITE_OTHER): Payer: Medicare HMO

## 2022-03-15 VITALS — Wt 213.0 lb

## 2022-03-15 DIAGNOSIS — Z0000 Encounter for general adult medical examination without abnormal findings: Secondary | ICD-10-CM | POA: Diagnosis not present

## 2022-03-15 NOTE — Progress Notes (Addendum)
I connected with  Eddie Coin. Speigner on 03/15/22 by a audio enabled telemedicine application and verified that I am speaking with the correct person using two identifiers.  Patient Location: Home  Provider Location: Office/Clinic  I discussed the limitations of evaluation and management by telemedicine. The patient expressed understanding and agreed to proceed.   Subjective:   Eddie Ferguson is a 82 y.o. male who presents for Medicare Annual/Subsequent preventive examination.  Review of Systems     Cardiac Risk Factors include: advanced age (>5mn, >>24women)     Objective:    Today's Vitals   03/15/22 1115  Weight: 213 lb (96.6 kg)   Body mass index is 32.39 kg/m.     03/15/2022   11:22 AM 12/17/2021   10:58 AM 06/16/2021   11:13 AM 02/23/2021   11:10 AM 12/16/2020   10:52 AM 06/15/2020   10:05 AM 02/20/2020    9:47 AM  Advanced Directives  Does Patient Have a Medical Advance Directive? Yes Yes Yes Yes Yes Yes Yes  Type of [place]amed[place]of [place] will  [place] will;Out of facility DNR (pink MOST or yellow form) [place]   [place]  Copy of [place] Chart? No - copy requested   No - copy requested   No - copy requested    Current Medications (verified) Outpatient Encounter Medications as of 03/15/2022  Medication Sig   acetaminophen (TYLENOL) 500 MG tablet Take 500 mg by mouth every 6 (six) hours as needed. Reported on 06/16/2015   allopurinol (ZYLOPRIM) 100 MG tablet TAKE 1 TABLET BY MOUTH TWICE A DAY   amLODipine (NORVASC) 5 MG tablet TAKE 1 TABLET BY MOUTH EVERY DAY   benazepril (LOTENSIN) 40 MG tablet Take 1 tablet (40 mg total) by mouth daily.   carvedilol (COREG) 25 MG tablet TAKE 1/2 TABLETS BY MOUTH 2 TIMES DAILY WITH A MEAL.   chlorthalidone (HYGROTON) 25 MG tablet TAKE 1/2 TO 1 TABLET BY MOUTH DAILY   donepezil  (ARICEPT) 10 MG tablet Take 1 tablet (10 mg total) by mouth daily.   famotidine (PEPCID) 20 MG tablet Take 20 mg by mouth daily.   fexofenadine (ALLEGRA) 180 MG tablet Take 180 mg by mouth.   FLUAD QUADRIVALENT 0.5 ML injection    fluticasone (FLONASE) 50 MCG/ACT nasal spray Place 2 sprays into both nostrils daily.   KLOR-CON M20 20 MEQ tablet TAKE 2 TABLETS BY MOUTH 2 TIMES DAILY.   levothyroxine (SYNTHROID) 50 MCG tablet TAKE 1 TABLET BY MOUTH EVERY DAY   memantine (NAMENDA) 10 MG tablet Take 1 tablet twice a day   metFORMIN (GLUCOPHAGE) 500 MG tablet Take 1 tablet (500 mg total) by mouth daily with breakfast.   Multiple Vitamin (MULTIVITAMIN) tablet Take 1 tablet by mouth daily.   pravastatin (PRAVACHOL) 40 MG tablet TAKE 1 TABLET BY MOUTH EVERY DAY   methocarbamol (ROBAXIN) 750 MG tablet Take 750 mg by mouth 4 (four) times daily. (Patient not taking: Reported on 03/15/2022)   No facility-administered encounter medications on file as of 03/15/2022.    Allergies (verified) Aspirin, Nsaids, Amoxicillin-pot clavulanate, and Sulfa antibiotics   History: Past Medical History:  Diagnosis Date   Allergy    Ankle injury 06/01/2001   no surgery- skin debridement    Arthritis    Basal cell carcinoma 05/28/2014   L nasal tip. Mohs Dr. ALink Snuffer     Blood transfusion  2010 because of stomach ulcers   Cataract    bilaterally removed   Diabetes mellitus    GERD (gastroesophageal reflux disease)    Glaucoma    Hyperlipidemia    Hypertension    Hypothyroidism    Pneumonia 04/12/2006   Prostate cancer ([place])    Seasonal allergies    Sinusitis    treated for bacterial infection at least once a year at [facility]   Stomach ulcer 2010   bleeding ulcer result NSAIDS   Past Surgical History:  Procedure Laterality Date   abdominl abcess     03-15-2011   BACK SURGERY     CATARACT EXTRACTION, BILATERAL     04-07-2014, 03-17-2014   COLONOSCOPY     2012, 5 year repeat   CYSTOURETHROSCOPY   11/11/1998   ESOPHAGOGASTRODUODENOSCOPY  2010   PUD   LUMBAR [place]CTOMY  2007   [place]   MOHS SURGERY     left side of nose   POLYPECTOMY     PROSTATE BIOPSY     radiation 2016   RECONSTRUCTION TENDON PULLEY W/ TENDON / FASCIAL GRAFT OF HAND / FINGER  1982   rt 3rd finger   RECONSTRUCTION TENDON PULLEY W/ TENDON / FASCIAL GRAFT OF HAND / FINGER  1953   rt 5th finger   UPPER GASTROINTESTINAL ENDOSCOPY     Family History  Problem Relation Age of Onset   Diabetes Mother    Diabetes Father    Hyperlipidemia Father    Colon polyps Father    Brain cancer Brother        smoker   Dementia Paternal Grandmother    Colon cancer Neg Hx    Esophageal cancer Neg Hx    Stomach cancer Neg Hx    Rectal cancer Neg Hx    Social History   Socioeconomic History   Marital status: Married    Spouse name: Not on file   Number of children: 2   Years of education: 4   Highest education level: Not on file  Occupational History   Occupation: retired  Tobacco Use   Smoking status: Former    Types: Cigarettes    Quit date: 02/03/2001    Years since quitting: 21.1   Smokeless tobacco: Never   Tobacco comments:    intermittent cigar use stopped  Vaping Use   Vaping Use: Never used  Substance and Sexual Activity   Alcohol use: Yes    Alcohol/week: 0.0 standard drinks of alcohol    Comment: occasional beer   Drug use: No   Sexual activity: Not Currently    Partners: Female  Other Topics Concern   Not on file  Social History Narrative   Married (wife patient outside practice). 2 children. 5 grandkids.       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       Hobbies: travel, enjoys going to shows, [employer] [facility] [employer] [facility] women's basketball tournament. Doesn't watch tv.             Lives in 1 story condo on the 3rd floor   Bachelor's degree   Right handed   Social Determinants of Health   Financial Resource Strain: Low Risk  (03/15/2022)   Overall Financial Resource Strain (CARDIA)    Difficulty of  Paying Living Expenses: Not hard at all  Food Insecurity: No Food Insecurity (03/15/2022)   Hunger Vital Sign    Worried About Running Out of Food in the Last Year: Never true    Ran Out of Food  in the Last Year: Never true  Transportation Needs: No Transportation Needs (03/15/2022)   PRAPARE - Hydrologist (Medical): No    Lack of Transportation (Non-Medical): No  Physical Activity: Inactive (03/15/2022)   Exercise Vital Sign    Days of Exercise per Week: 0 days    Minutes of Exercise per Session: 0 min  Stress: No Stress Concern Present (03/15/2022)   [place]    Feeling of Stress : Not at all  Social Connections: Moderately Isolated (03/15/2022)   Social Connection and Isolation Panel [NHANES]    Frequency of Communication with Friends and Family: Twice a week    Frequency of Social Gatherings with Friends and Family: Twice a week    Attends Religious Services: Never    Marine scientist or Organizations: No    Attends Music therapist: Never    Marital Status: Married    Tobacco Counseling Counseling given: Not Answered Tobacco comments: intermittent cigar use stopped   Clinical Intake:  Pre-visit preparation completed: Yes  Pain : No/denies pain     BMI - recorded: 32.39 Nutritional Status: BMI > 30  Obese Nutritional Risks: None Diabetes: Yes CBG done?: No Did pt. bring in CBG monitor from home?: No  How often do you need to have someone help you when you read instructions, pamphlets, or other written materials from your doctor or pharmacy?: 1 - Never  Diabetic?Nutrition Risk Assessment:  Has the patient had any N/V/D within the last 2 months?  No  Does the patient have any non-healing wounds?  No  Has the patient had any unintentional weight loss or weight gain?  No   Diabetes:  Is the patient diabetic?  Yes  If diabetic, was a CBG obtained today?  No   Did the patient bring in their glucometer from home?  No  How often do you monitor your CBG's? N/A.   Financial Strains and Diabetes Management:  Are you having any financial strains with the device, your supplies or your medication? No .  Does the patient want to be seen by Chronic Care Management for management of their diabetes?  No  Would the patient like to be referred to a Nutritionist or for Diabetic Management?  No   Diabetic Exams:  Diabetic Eye Exam: Completed 06/08/21 Diabetic Foot Exam: Completed 05/25/21   Interpreter Needed?: No  Information entered by :: Charlott Rakes, LPN   Activities of Daily Living    03/15/2022   11:23 AM  In your present state of health, do you have any difficulty performing the following activities:  Hearing? 0  Vision? 0  Difficulty concentrating or making decisions? 0  Walking or climbing stairs? 0  Comment no stairs to climb and uses a walker  Dressing or bathing? 0  Doing errands, shopping? 0  Preparing Food and eating ? Y  Comment meals provided  Using the Toilet? N  In the past six months, have you accidently leaked urine? N  Do you have problems with loss of bowel control? N  Managing your Medications? Y  Comment wife assist  Managing your Finances? N  Housekeeping or managing your Housekeeping? N    Patient Care Team: Marin Olp, MD as PCP - General (Family Medicine) Cameron Sprang, MD as Consulting Physician (Neurology) Gardiner Barefoot, DPM as Consulting Physician (Podiatry) Barbaraann Cao, OD as Consulting Physician (Optometry) Dermatology, [facility] Hights as Consulting Physician Jardine,  Nancy Marus (Neurology)  Indicate any recent Medical Services you may have received from other than [facility] providers in the past year (date may be approximate).     Assessment:   This is a routine wellness examination for [place].  Hearing/Vision screen Hearing Screening - Comments:: Pt denies any hearing issues  Vision  Screening - Comments:: Pt follows upwith Dr Sabra Heck for annual eye exams   Dietary issues and exercise activities discussed: Current Exercise Habits: The patient does not participate in regular exercise at present   Goals Addressed             This Visit's Progress    Patient Stated       None at this time        Depression Screen    03/15/2022   11:21 AM 02/23/2021   11:09 AM 05/19/2020   10:57 AM 05/07/2020   11:23 AM 02/20/2020    9:45 AM 03/21/2019   10:26 AM 01/15/2019   10:38 AM  PHQ 2/9 Scores  PHQ - 2 Score 0 0 0 0 0 0 0  PHQ- 9 Score      0     Fall Risk    03/15/2022   11:23 AM 12/17/2021   10:58 AM 06/16/2021   11:13 AM 02/23/2021   11:11 AM 12/16/2020   10:51 AM  Fall Risk   Falls in the past year? 0 0 0 0 0  Number falls in past yr: 0 0 0 0 0  Injury with Fall? 0 0 0 0 0  Risk for fall due to : Impaired balance/gait;Impaired vision   Impaired balance/gait;Impaired vision   Risk for fall due to: Comment uses walker      Follow up Falls prevention discussed Falls evaluation completed  Falls prevention discussed     FALL RISK PREVENTION PERTAINING TO THE HOME:  Any stairs in or around the home? No  If so, are there any without handrails? No  Home free of loose throw rugs in walkways, pet beds, electrical cords, etc? Yes  Adequate lighting in your home to reduce risk of falls? Yes   ASSISTIVE DEVICES UTILIZED TO PREVENT FALLS:  Life alert? Yes  Use of a cane, walker or w/c? Yes  Grab bars in the bathroom? Yes  Shower chair or bench in shower? Yes  Elevated toilet seat or a handicapped toilet? No   TIMED UP AND GO:  Was the test performed? No .   Cognitive Function: completed see note below     12/17/2021   11:00 AM 06/16/2021   11:00 AM 06/08/2017    8:46 AM 04/15/2016   11:28 AM  MMSE - Mini Mental State Exam  Not completed:   Refused   Orientation to time '3 5  5  '$ Orientation to Place '4 5  5  '$ Registration '3 3  3  '$ Attention/ Calculation '5 3  5   '$ Recall 0 0  1  Language- name 2 objects '2 2  2  '$ Language- repeat '1 1  1  '$ Language- follow 3 step command '3 3  3  '$ Language- read & follow direction '1 1  1  '$ Write a sentence '1 1  1  '$ Copy design 1 0  1  Total score '24 24  28      '$ 12/16/2020    1:00 PM 05/10/2018    8:00 AM 09/26/2017   10:00 AM 03/27/2017    9:00 AM  Montreal Cognitive Assessment   Visuospatial/ Executive (  0/5) '1 2 4 4  '$ Naming (0/3) '2 3 2 3  '$ Attention: Read list of digits (0/2) '2 2 2 2  '$ Attention: Read list of letters (0/1) '1 1 1 1  '$ Attention: Serial 7 subtraction starting at 100 (0/3) '3 3 3 3  '$ Language: Repeat phrase (0/2) 0 '2 2 2  '$ Language : Fluency (0/1) '1 1 1 1  '$ Abstraction (0/2) 0 '2 2 2  '$ Delayed Recall (0/5) 0 0 0 0  Orientation (0/6) '2 6 6 6  '$ Total '12 22 23 24  '$ Adjusted Score (based on education) 12         03/15/2022   11:25 AM 02/23/2021   11:13 AM 02/20/2020    9:53 AM  6CIT Screen  What Year? 0 points 0 points   What month? 3 points 0 points 0 points  What time? 0 points 0 points   Count back from 20 0 points 0 points 0 points  Months in reverse 4 points 0 points 4 points  Repeat phrase 10 points 0 points 10 points  Total Score 17 points 0 points     Immunizations Immunization History  Administered Date(s) Administered   Fluad Quad(high Dose 65+) 01/07/2019, 01/08/2020, 12/28/2021   Hepatitis B 06/19/2007, 11/16/2007, 04/18/2008   Hepatitis B, PED/ADOLESCENT 06/19/2007, 11/16/2007, 04/18/2008   Hepatitis B, adult 06/19/2007, 11/16/2007, 04/18/2008   Influenza Whole 01/27/2012   Influenza, High Dose Seasonal PF 01/27/2012, 12/21/2015, 12/20/2016, 12/01/2017   Influenza,inj,Quad PF,6+ Mos 02/24/2015   Influenza-Unspecified 01/27/2012, 01/21/2014, 12/31/2020   Meningococcal Polysaccharide 12/19/2008   PFIZER Comirnaty(Gray Top)Covid-19 Tri-Sucrose Vaccine 06/16/2020, 02/16/2022   PFIZER(Purple Top)SARS-COV-2 Vaccination 03/27/2019, 04/17/2019, 12/10/2019   Pfizer Covid-19 Vaccine Bivalent  Booster 30yr & up 12/01/2020   Pneumococcal Conjugate-13 08/25/2014   Pneumococcal Polysaccharide-23 06/19/2007, 12/19/2008, 12/21/2015   Respiratory Syncytial Virus Vaccine,Recomb Aduvanted(Arexvy) 02/23/2022   Td 04/23/2014   Tdap 03/07/2001   Zoster Recombinat (Shingrix) 05/19/2020, 07/21/2020   Zoster, Live 08/12/2008    TDAP status: Up to date  Flu Vaccine status: Up to date  Pneumococcal vaccine status: Up to date  Covid-19 vaccine status: Completed vaccines  Qualifies for Shingles Vaccine? Yes   Zostavax completed Yes   Shingrix Completed?: Yes  Screening Tests Health Maintenance  Topic Date Due   COVID-19 Vaccine (7 - 2023-24 season) 04/13/2022   FOOT EXAM  05/26/2022   HEMOGLOBIN A1C  05/27/2022   OPHTHALMOLOGY EXAM  06/09/2022   Diabetic kidney evaluation - eGFR measurement  11/27/2022   Diabetic kidney evaluation - Urine ACR  11/27/2022   Medicare Annual Wellness (AWV)  03/16/2023   DTaP/Tdap/Td (3 - Td or Tdap) 04/23/2024   Pneumonia Vaccine 82 Years old  Completed   INFLUENZA VACCINE  Completed   Zoster Vaccines- Shingrix  Completed   HPV VACCINES  Aged Out   COLONOSCOPY (Pts 45-454yrInsurance coverage will need to be confirmed)  Discontinued    Health Maintenance  There are no preventive care reminders to display for this patient.   Colorectal cancer screening: No longer required.    Additional Screening:  Vision Screening: Recommended annual ophthalmology exams for early detection of glaucoma and other disorders of the eye. Is the patient up to date with their annual eye exam?  Yes  Who is the provider or what is the name of the office in which the patient attends annual eye exams? Dr MiSabra HeckIf pt is not established with a provider, would they like to be referred to a provider to establish care? No .  Dental Screening: Recommended annual dental exams for proper oral hygiene  Community Resource Referral / Chronic Care Management: CRR  required this visit?  No   CCM required this visit?  No      Plan:     I have personally reviewed and noted the following in the patient's chart:   Medical and social history Use of alcohol, tobacco or illicit drugs  Current medications and supplements including opioid prescriptions. Patient is not currently taking opioid prescriptions. Functional ability and status Nutritional status Physical activity Advanced directives List of other physicians Hospitalizations, surgeries, and ER visits in previous 12 months Vitals Screenings to include cognitive, depression, and falls Referrals and appointments  In addition, I have reviewed and discussed with patient certain preventive protocols, quality metrics, and best practice recommendations. A written personalized care plan for preventive services as well as general preventive health recommendations were provided to patient.     Willette Brace, LPN   5/0/3546   Nurse Notes: pt 6CIT was 17 he was able o read the calendar when asked for the month as his wife expressed,

## 2022-03-15 NOTE — Patient Instructions (Signed)
Eddie Ferguson , Thank you for taking time to come for your Medicare Wellness Visit. I appreciate your ongoing commitment to your health goals. Please review the following plan we discussed and let me know if I can assist you in the future.   These are the goals we discussed:  Goals      Exercise 150 minutes per week (moderate activity)     [facility]  May try the silver sneaker class;      Patient Stated     Keep moving and staying engaged in life May consider silver sneakers      Patient Stated     Stay healthy get up every day and go to bed at night     Patient Stated     None at this time         This is a list of the screening recommended for you and due dates:  Health Maintenance  Topic Date Due   COVID-19 Vaccine (7 - 2023-24 season) 04/13/2022   Complete foot exam   05/26/2022   Hemoglobin A1C  05/27/2022   Eye exam for diabetics  06/09/2022   Yearly kidney function blood test for diabetes  11/27/2022   Yearly kidney health urinalysis for diabetes  11/27/2022   Medicare Annual Wellness Visit  03/16/2023   DTaP/Tdap/Td vaccine (3 - Td or Tdap) 04/23/2024   Pneumonia Vaccine  Completed   Flu Shot  Completed   Zoster (Shingles) Vaccine  Completed   HPV Vaccine  Aged Out   Colon Cancer Screening  Discontinued    Advanced directives: Please bring a copy of your health care power of attorney and living will to the office at your convenience.  Conditions/risks identified: none at this time   Next appointment: Follow up in one year for your annual wellness visit.   Preventive Care 22 Years and Older, Male  Preventive care refers to lifestyle choices and visits with your health care provider that can promote health and wellness. What does preventive care include? A yearly physical exam. This is also called an annual well check. Dental exams once or twice a year. Routine eye exams. Ask your health care provider how often you should have your eyes  checked. Personal lifestyle choices, including: Daily care of your teeth and gums. Regular physical activity. Eating a healthy diet. Avoiding tobacco and drug use. Limiting alcohol use. Practicing safe sex. Taking low doses of aspirin every day. Taking vitamin and mineral supplements as recommended by your health care provider. What happens during an annual well check? The services and screenings done by your health care provider during your annual well check will depend on your age, overall health, lifestyle risk factors, and family history of disease. Counseling  Your health care provider may ask you questions about your: Alcohol use. Tobacco use. Drug use. Emotional well-being. Home and relationship well-being. Sexual activity. Eating habits. History of falls. Memory and ability to understand (cognition). Work and work Statistician. Screening  You may have the following tests or measurements: Height, weight, and BMI. Blood pressure. Lipid and cholesterol levels. These may be checked every 5 years, or more frequently if you are over 30 years old. Skin check. Lung cancer screening. You may have this screening every year starting at age 37 if you have a 30-pack-year history of smoking and currently smoke or have quit within the past 15 years. Fecal occult blood test (FOBT) of the stool. You may have this test every  year starting at age 39. Flexible sigmoidoscopy or colonoscopy. You may have a sigmoidoscopy every 5 years or a colonoscopy every 10 years starting at age 66. Prostate cancer screening. Recommendations will vary depending on your family history and other risks. Hepatitis C blood test. Hepatitis B blood test. Sexually transmitted disease (STD) testing. Diabetes screening. This is done by checking your blood sugar (glucose) after you have not eaten for a while (fasting). You may have this done every 1-3 years. Abdominal aortic aneurysm (AAA) screening. You may need this  if you are a current or former smoker. Osteoporosis. You may be screened starting at age 57 if you are at high risk. Talk with your health care provider about your test results, treatment options, and if necessary, the need for more tests. Vaccines  Your health care provider may recommend certain vaccines, such as: Influenza vaccine. This is recommended every year. Tetanus, diphtheria, and acellular pertussis (Tdap, Td) vaccine. You may need a Td booster every 10 years. Zoster vaccine. You may need this after age 16. Pneumococcal 13-valent conjugate (PCV13) vaccine. One dose is recommended after age 68. Pneumococcal polysaccharide (PPSV23) vaccine. One dose is recommended after age 78. Talk to your health care provider about which screenings and vaccines you need and how often you need them. This information is not intended to replace advice given to you by your health care provider. Make sure you discuss any questions you have with your health care provider. Document Released: 03/20/2015 Document Revised: 11/11/2015 Document Reviewed: 12/23/2014 Elsevier Interactive Patient Education  2017 [employer] Prevention in the Home Falls can cause injuries. They can happen to people of all ages. There are many things you can do to make your home safe and to help prevent falls. What can I do on the outside of my home? Regularly fix the edges of walkways and driveways and fix any cracks. Remove anything that might make you trip as you walk through a door, such as a raised step or threshold. Trim any bushes or trees on the path to your home. Use bright outdoor lighting. Clear any walking paths of anything that might make someone trip, such as rocks or tools. Regularly check to see if handrails are loose or broken. Make sure that both sides of any steps have handrails. Any raised decks and porches should have guardrails on the edges. Have any leaves, snow, or ice cleared regularly. Use sand  or salt on walking paths during winter. Clean up any spills in your garage right away. This includes oil or grease spills. What can I do in the bathroom? Use night lights. Install grab bars by the toilet and in the tub and shower. Do not use towel bars as grab bars. Use non-skid mats or decals in the tub or shower. If you need to sit down in the shower, use a plastic, non-slip stool. Keep the floor dry. Clean up any water that spills on the floor as soon as it happens. Remove soap buildup in the tub or shower regularly. Attach bath mats securely with double-sided non-slip rug tape. Do not have throw rugs and other things on the floor that can make you trip. What can I do in the bedroom? Use night lights. Make sure that you have a light by your bed that is easy to reach. Do not use any sheets or blankets that are too big for your bed. They should not hang down onto the floor. Have a firm chair that has side  arms. You can use this for support while you get dressed. Do not have throw rugs and other things on the floor that can make you trip. What can I do in the kitchen? Clean up any spills right away. Avoid walking on wet floors. Keep items that you use a lot in easy-to-reach places. If you need to reach something above you, use a strong step stool that has a grab bar. Keep electrical cords out of the way. Do not use floor polish or wax that makes floors slippery. If you must use wax, use non-skid floor wax. Do not have throw rugs and other things on the floor that can make you trip. What can I do with my stairs? Do not leave any items on the stairs. Make sure that there are handrails on both sides of the stairs and use them. Fix handrails that are broken or loose. Make sure that handrails are as long as the stairways. Check any carpeting to make sure that it is firmly attached to the stairs. Fix any carpet that is loose or worn. Avoid having throw rugs at the top or bottom of the stairs.  If you do have throw rugs, attach them to the floor with carpet tape. Make sure that you have a light switch at the top of the stairs and the bottom of the stairs. If you do not have them, ask someone to add them for you. What else can I do to help prevent falls? Wear shoes that: Do not have high heels. Have rubber bottoms. Are comfortable and fit you well. Are closed at the toe. Do not wear sandals. If you use a stepladder: Make sure that it is fully opened. Do not climb a closed stepladder. Make sure that both sides of the stepladder are locked into place. Ask someone to hold it for you, if possible. Clearly mark and make sure that you can see: Any grab bars or handrails. First and last steps. Where the edge of each step is. Use tools that help you move around (mobility aids) if they are needed. These include: Canes. Walkers. Scooters. Crutches. Turn on the lights when you go into a dark area. Replace any light bulbs as soon as they burn out. Set up your furniture so you have a clear path. Avoid moving your furniture around. If any of your floors are uneven, fix them. If there are any pets around you, be aware of where they are. Review your medicines with your doctor. Some medicines can make you feel dizzy. This can increase your chance of falling. Ask your doctor what other things that you can do to help prevent falls. This information is not intended to replace advice given to you by your health care provider. Make sure you discuss any questions you have with your health care provider. Document Released: 12/18/2008 Document Revised: 07/30/2015 Document Reviewed: 03/28/2014 Elsevier Interactive Patient Education  2017 [employer].

## 2022-03-29 ENCOUNTER — Other Ambulatory Visit: Payer: Self-pay | Admitting: Family Medicine

## 2022-04-21 ENCOUNTER — Other Ambulatory Visit: Payer: Self-pay | Admitting: Family Medicine

## 2022-05-12 ENCOUNTER — Encounter: Payer: Self-pay | Admitting: Radiology

## 2022-05-25 ENCOUNTER — Ambulatory Visit (INDEPENDENT_AMBULATORY_CARE_PROVIDER_SITE_OTHER): Payer: Medicare HMO | Admitting: Family Medicine

## 2022-05-25 ENCOUNTER — Encounter: Payer: Self-pay | Admitting: Family Medicine

## 2022-05-25 DIAGNOSIS — I700 Atherosclerosis of aorta: Secondary | ICD-10-CM | POA: Diagnosis not present

## 2022-05-25 DIAGNOSIS — E1122 Type 2 diabetes mellitus with diabetic chronic kidney disease: Secondary | ICD-10-CM | POA: Diagnosis not present

## 2022-05-25 DIAGNOSIS — M1A0720 Idiopathic chronic gout, left ankle and foot, without tophus (tophi): Secondary | ICD-10-CM | POA: Diagnosis not present

## 2022-05-25 DIAGNOSIS — E039 Hypothyroidism, unspecified: Secondary | ICD-10-CM

## 2022-05-25 DIAGNOSIS — R69 Illness, unspecified: Secondary | ICD-10-CM | POA: Diagnosis not present

## 2022-05-25 DIAGNOSIS — F0280 Dementia in other diseases classified elsewhere without behavioral disturbance: Secondary | ICD-10-CM

## 2022-05-25 DIAGNOSIS — G309 Alzheimer's disease, unspecified: Secondary | ICD-10-CM

## 2022-05-25 DIAGNOSIS — N1831 Chronic kidney disease, stage 3a: Secondary | ICD-10-CM

## 2022-05-25 LAB — COMPREHENSIVE METABOLIC PANEL
ALT: 21 U/L (ref 0–53)
AST: 19 U/L (ref 0–37)
Albumin: 4 g/dL (ref 3.5–5.2)
Alkaline Phosphatase: 72 U/L (ref 39–117)
BUN: 11 mg/dL (ref 6–23)
CO2: 32 mEq/L (ref 19–32)
Calcium: 9.7 mg/dL (ref 8.4–10.5)
Chloride: 100 mEq/L (ref 96–112)
Creatinine, Ser: 1.24 mg/dL (ref 0.40–1.50)
GFR: 53.58 mL/min — ABNORMAL LOW (ref >60.00)
Glucose, Bld: 140 mg/dL — ABNORMAL HIGH (ref 70–99)
Potassium: 4.2 mEq/L (ref 3.5–5.1)
Sodium: 142 mEq/L (ref 135–145)
Total Bilirubin: 0.8 mg/dL (ref 0.2–1.2)
Total Protein: 6.2 g/dL (ref 6.0–8.3)

## 2022-05-25 LAB — LIPID PANEL
Cholesterol: 133 mg/dL (ref 0–200)
HDL: 57.8 mg/dL (ref >39.00)
LDL Cholesterol: 60 mg/dL (ref 0–99)
NonHDL: 74.7
Total CHOL/HDL Ratio: 2
Triglycerides: 74 mg/dL (ref 0.0–149.0)
VLDL: 14.8 mg/dL (ref 0.0–40.0)

## 2022-05-25 LAB — TSH: TSH: 2.92 u[IU]/mL (ref 0.35–5.50)

## 2022-05-25 LAB — CBC WITH DIFFERENTIAL/PLATELET
Basophils Absolute: 0.1 10*3/uL (ref 0.0–0.1)
Basophils Relative: 0.8 % (ref 0.0–3.0)
Eosinophils Absolute: 0.2 10*3/uL (ref 0.0–0.7)
Eosinophils Relative: 2.3 % (ref 0.0–5.0)
HCT: 46 % (ref 39.0–52.0)
Hemoglobin: 15.4 g/dL (ref 13.0–17.0)
Lymphocytes Relative: 13.3 % (ref 12.0–46.0)
Lymphs Abs: 1.3 10*3/uL (ref 0.7–4.0)
MCHC: 33.5 g/dL (ref 30.0–36.0)
MCV: 95.1 fl (ref 78.0–100.0)
Monocytes Absolute: 0.8 10*3/uL (ref 0.1–1.0)
Monocytes Relative: 8.2 % (ref 3.0–12.0)
Neutro Abs: 7.1 10*3/uL (ref 1.4–7.7)
Neutrophils Relative %: 75.4 % (ref 43.0–77.0)
Platelets: 217 10*3/uL (ref 150.0–400.0)
RBC: 4.84 Mil/uL (ref 4.22–5.81)
RDW: 14.2 % (ref 11.5–15.5)
WBC: 9.4 10*3/uL (ref 4.0–10.5)

## 2022-05-25 LAB — URIC ACID: Uric Acid, Serum: 5.4 mg/dL (ref 4.0–7.8)

## 2022-05-25 LAB — HEMOGLOBIN A1C: Hgb A1c MFr Bld: 6.9 % — ABNORMAL HIGH (ref 4.6–6.5)

## 2022-05-25 MED ORDER — HYDROCORTISONE 2.5 % EX OINT: TOPICAL_OINTMENT | Freq: Two times a day (BID) | CUTANEOUS | 2 refills | Status: DC

## 2022-05-25 NOTE — Progress Notes (Signed)
Phone [phone] In person visit   Subjective:   Eddie Ferguson is a 82 y.o. year old very pleasant male patient who presents for/with See problem oriented charting Chief Complaint  Patient presents with   rectum discomfort    Pt c/o rectum discomfort x1 month that is there every day that bothers him when he sits down.    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 Alinda [facility] of [facility] 05/28/2014    Priority: High   Type 2 diabetes mellitus with diabetic chronic kidney disease ([place]) 04/23/2014    Priority: High   Aortic atherosclerosis ([place]) 01/05/2018    Priority: Medium    CKD (chronic kidney disease), stage III ([place]) 04/18/2017    Priority: Medium    Gout 07/09/2015    Priority: Medium    Essential hypertension 04/23/2014    Priority: Medium    Hyperlipidemia 04/23/2014    Priority: Medium    Hypothyroidism 04/23/2014    Priority: Medium    Adenomatous colon polyp 05/28/2014    Priority: Low   Osteoarthritis 05/28/2014    Priority: Low   Erectile dysfunction 05/28/2014    Priority: Low   Basal cell carcinoma 05/28/2014    Priority: Low   GERD (gastroesophageal reflux disease) 04/23/2014    Priority: Low   Allergic rhinitis 04/23/2014    Priority: Low   Glaucoma 04/23/2014    Priority: Low   History of nonmelanoma skin cancer 11/18/2012    Priority: Low   Pain due to onychomycosis of toenails of both feet 08/17/2020   Porokeratosis 08/17/2020   Positive FIT (fecal immunochemical test) 06/17/2020   Intermittent diarrhea 06/17/2020   History of shingles 04/24/2011    Medications- reviewed and updated Current Outpatient Medications  Medication Sig Dispense Refill   acetaminophen (TYLENOL) 500 MG tablet Take 500 mg by mouth every 6 (six) hours as needed. Reported on 06/16/2015     allopurinol  (ZYLOPRIM) 100 MG tablet TAKE 1 TABLET BY MOUTH TWICE A DAY 180 tablet 3   amLODipine (NORVASC) 5 MG tablet TAKE 1 TABLET BY MOUTH EVERY DAY 90 tablet 1   benazepril (LOTENSIN) 40 MG tablet TAKE 1 TABLET BY MOUTH EVERY DAY 90 tablet 3   carvedilol (COREG) 25 MG tablet TAKE 1/2 TABLETS BY MOUTH 2 TIMES DAILY WITH A MEAL. 90 tablet 3   chlorthalidone (HYGROTON) 25 MG tablet TAKE 1/2 TO 1 TABLET BY MOUTH DAILY 90 tablet 1   donepezil (ARICEPT) 10 MG tablet Take 1 tablet (10 mg total) by mouth daily. 90 tablet 3   famotidine (PEPCID) 20 MG tablet Take 20 mg by mouth daily.     fexofenadine (ALLEGRA) 180 MG tablet Take 180 mg by mouth.     fluticasone (FLONASE) 50 MCG/ACT nasal spray Place 2 sprays into both nostrils daily. 48 g 3   KLOR-CON M20 20 MEQ tablet TAKE 2 TABLETS BY MOUTH 2 TIMES DAILY. 360 tablet 3   levothyroxine (SYNTHROID) 50 MCG tablet TAKE 1 TABLET BY MOUTH EVERY DAY 90 tablet 3   memantine (NAMENDA) 10 MG tablet Take 1 tablet twice a day 180 tablet 3   metFORMIN (GLUCOPHAGE) 500 MG tablet Take 1 tablet (500 mg total) by mouth daily with breakfast. 90 tablet 3   methocarbamol (ROBAXIN) 750 MG tablet Take 750 mg by mouth 4 (four) times daily.  Multiple Vitamin (MULTIVITAMIN) tablet Take 1 tablet by mouth daily.     pravastatin (PRAVACHOL) 40 MG tablet TAKE 1 TABLET BY MOUTH EVERY DAY 90 tablet 3   No current facility-administered medications for this visit.     Objective:  BP 138/78   Pulse 63   Temp 97.8 F (36.6 C)   Ht 5\' 8"  (1.727 m)   Wt 211 lb 12.8 oz (96.1 kg)   SpO2 98%   BMI 32.20 kg/m  Gen: NAD, resting comfortably CV: RRR no murmurs rubs or gallops Lungs: CTAB no crackles, wheeze, rhonchi  Ext: no edema Skin: warm, dry, stage I pressure ulcer - non blanching erythema about 10 cm out bilaterally - also noted hemorrhoids/tags that are not overly tender but are enlarged    Assessment and Plan   # Rectal discomfort S: Symptoms started about a month ago.   Has discomfort every day when he sits down. Constant mild irritation- sometimes will blurt out in pain. Didn't want to use donut. Denies inciting event.  GI has thought he may have hemorrhoids or radiation proctitis in the past.  No worse with bowel movements. Has tried tube of triple antibiotic ontiment- bacitracin, neomycin, polymyxin- mild help. Some diarrhea issues per wife still- had referred to GI in past- he reports stable -per wife pain seems more substantial than what he reports today- known dementia A/P: has a stage I pressure ulcer it appears expanding out several inches bilaterally- main treatment is avoiding prolonged sitting- 30 mins max then at least needs to stand for 1 minute and try to reposition- ssitting in a different chair or position could help. Could use vaseline to keep area lubricated - also has hemorrhoid tags- try steroid cream twice daily 7-10 days - return to see [place] if not better in 2-3 weeks   %history of prostate cancer- follows with Dr. Alinda Money after external beam therapy 2016. Original diagnosis 2013 and had active surveillance at first  # Dementia- sees Dr. Delice Lesch S:medication:  aricept and namenda A/P: ongoing gradual decline in memory- continue to monitor   # Diabetes S: Medication:metformin 500mg - prior fatigue but seems to tolerate ok now Lab Results  Component Value Date   HGBA1C 7.6 (H) 11/26/2021   HGBA1C 6.7 (A) 06/01/2021   HGBA1C 6.5 11/19/2020   A/P: hopefully stable- update a1c today. Continue current meds for now  #hypertension #CKD III- GFR typically 1.2-1.4 and most recently  S: medication: Benazepril 40 mg, chlorthalidone 25 mg, amlodipine 5 mg, carvedilol 25 mg twice daily.  Does need potassium with chlorthalidone  Home readings #s: no recent checks BP Readings from Last 3 Encounters:  05/25/22 138/78  12/17/21 133/74  11/26/21 120/60   A/P: hypertension - reasonable for age- hesitant to increase dose and increase fall risk- they will  also monitor at home as trending up  Ckd hopefully stable- update cmp today. Continue current meds for now - also on high dose potassium 80 meq per day- may need reduction depending on labs  #hyperlipidemia with aortic atherosclerosis S: Medication:Pravastatin 40 mg Lab Results  Component Value Date   CHOL 137 05/26/2021   HDL 54.10 05/26/2021   LDLCALC 60 05/26/2021   LDLDIRECT 90.0 09/28/2016   TRIG 114.0 05/26/2021   CHOLHDL 3 05/26/2021  A/P: hopefully stable- update lipid panel today. Continue current meds for now Aortic atherosclerosis (presumed stable)- LDL goal ideally <70 - update lipids and continue current medications   #hypothyroidism S: compliant On thyroid medication-levothyroxine 50 mcg  Lab Results  Component Value Date   TSH 2.87 11/26/2021  A/P:hopefully stable- update tsh  today. Continue current meds for now   #Gout S: 0 flares allopurinol 100 mg despite uric acid being slightly above goal Lab Results  Component Value Date   LABURIC 6.7 05/26/2021  A/P: no flares but update uric acid   Recommended follow up: Return for next already scheduled visit or sooner if needed. Future Appointments  Date Time Provider [place]  05/31/2022  1:15 PM Gardiner Barefoot, DPM [facility]  06/21/2022 11:00 AM Rondel Jumbo, PA-C [facility] None  09/26/2022  3:00 PM Marin Olp, MD [facility]  03/21/2023 11:15 AM [facility] ANNUAL WELLNESS VISIT 1 [facility]    Lab/Order associations: FASTING   ICD-10-CM   1. Type 2 diabetes mellitus with stage 3a chronic kidney disease, without long-term current use of insulin (H[facility])  E11.22 CBC with Differential/Platelet   N18.31 Comprehensive metabolic panel    Lipid panel    Hemoglobin A1c    2. Hypothyroidism, unspecified type  E03.9 TSH    3. Idiopathic chronic gout of left foot without tophus  M1A.0720 Uric acid    4. 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 Aortic  atherosclerosis ([facility]) Chronic I70.0       Meds ordered this encounter  Medications   hydrocortisone 2.5 % ointment    Sig: Apply topically 2 (two) times daily. For 7-10 days for hemorrhoid tags- Can reuse in future but need at least 1 week break    Dispense:  30 g    Refill:  2    Return precautions advised.  Garret Reddish, MD

## 2022-05-25 NOTE — Patient Instructions (Addendum)
Please stop by lab before you go If you have mychart- we will send your results within 3 business days of [place] receiving them.  If you do not have mychart- we will call you about results within 5 business days of [place] receiving them.  *please also note that you will see labs on mychart as soon as they post. I will later go in and write notes on them- will say "notes from Dr. Yong Channel"    has a stage I pressure ulcer it appears expanding out several inches bilaterally- main treatment is avoiding prolonged sitting- 30 mins max then at least needs to stand for 1 minute and try to reposition- ssitting in a different chair or position could help. Could use vaseline to keep area lubricated - also has hemorrhoid tags- try steroid cream twice daily 7-10 days - return to see [place] if not better in 2-3 weeks    Recommended follow up: Return for next already scheduled visit or sooner if needed.

## 2022-05-30 ENCOUNTER — Encounter: Payer: Medicare HMO | Admitting: Family Medicine

## 2022-05-31 ENCOUNTER — Ambulatory Visit (INDEPENDENT_AMBULATORY_CARE_PROVIDER_SITE_OTHER): Payer: Medicare HMO | Admitting: Podiatry

## 2022-05-31 ENCOUNTER — Encounter: Payer: Self-pay | Admitting: Podiatry

## 2022-05-31 DIAGNOSIS — M79674 Pain in right toe(s): Secondary | ICD-10-CM

## 2022-05-31 DIAGNOSIS — Q828 Other specified congenital malformations of skin: Secondary | ICD-10-CM | POA: Diagnosis not present

## 2022-05-31 DIAGNOSIS — M79675 Pain in left toe(s): Secondary | ICD-10-CM

## 2022-05-31 DIAGNOSIS — N1831 Chronic kidney disease, stage 3a: Secondary | ICD-10-CM

## 2022-05-31 DIAGNOSIS — B351 Tinea unguium: Secondary | ICD-10-CM

## 2022-05-31 DIAGNOSIS — E1122 Type 2 diabetes mellitus with diabetic chronic kidney disease: Secondary | ICD-10-CM

## 2022-05-31 NOTE — Progress Notes (Signed)
This patient returns to my office for at risk foot care.  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  This patient is unable to cut nails himself since the patient cannot reach his nails.These nails are painful walking and wearing shoes. Marland Kitchen He presents to the office with his wife. This patient presents for at risk foot care today.  General Appearance  Alert, conversant and in no acute stress.  Vascular  Dorsalis pedis and posterior tibial  pulses are palpable  bilaterally.  Capillary return is within normal limits  bilaterally. Temperature is within normal limits  bilaterally.  Neurologic  Senn-Weinstein monofilament wire test within normal limits  bilaterally. Muscle power within normal limits bilaterally.  Nails Thick disfigured discolored nails with subungual debris  from hallux to fifth toes bilaterally. No evidence of bacterial infection or drainage bilaterally.  Orthopedic  No limitations of motion  feet .  No crepitus or effusions noted.  No bony pathology or digital deformities noted.  Skin  normotropic skin with no porokeratosis noted bilaterally.  No signs of infections or ulcers noted.   Callus sub 5th met B/L  symptomatic.  Onychomycosis  Pain in right toes  Pain in toes Callus sub 5th right foot.  Consent was obtained for treatment procedures.   Mechanical debridement of nails 1-5  bilaterally performed with a nail nipper.  Filed with dremel without incident.  Debride porokeratosis with # 15 blade.   Return office visit    3 months                  Told patient to return for periodic foot care and evaluation due to potential at risk complications.   Gardiner Barefoot DPM

## 2022-06-02 ENCOUNTER — Encounter: Payer: Medicare HMO | Admitting: Family Medicine

## 2022-06-21 ENCOUNTER — Ambulatory Visit: Payer: Medicare HMO | Admitting: Physician Assistant

## 2022-06-21 ENCOUNTER — Encounter: Payer: Self-pay | Admitting: Physician Assistant

## 2022-06-21 VITALS — BP 121/59 | HR 77 | Resp 18 | Ht 68.0 in | Wt 211.0 lb

## 2022-06-21 DIAGNOSIS — F0280 Dementia in other diseases classified elsewhere without behavioral disturbance: Secondary | ICD-10-CM

## 2022-06-21 DIAGNOSIS — F03A0 Unspecified dementia, mild, without behavioral disturbance, psychotic disturbance, mood disturbance, and anxiety: Secondary | ICD-10-CM

## 2022-06-21 DIAGNOSIS — G309 Alzheimer's disease, unspecified: Secondary | ICD-10-CM | POA: Diagnosis not present

## 2022-06-21 MED ORDER — DONEPEZIL HCL 23 MG PO TABS
23.0000 mg | ORAL_TABLET | Freq: Every day | ORAL | 3 refills | Status: DC
Start: 2022-06-21 — End: 2022-09-07

## 2022-06-21 MED ORDER — MEMANTINE HCL 10 MG PO TABS: ORAL_TABLET | ORAL | 3 refills | Status: DC

## 2022-06-21 NOTE — Progress Notes (Addendum)
Assessment/Plan:   Memory Impairment  Eddie Ferguson. Halls is a very pleasant 82 y.o. RH male  with a history of hypertension, hyperlipidemia, diabetes, CKD stage III and a history of mild dementia likely due to Alzheimer's disease seen today in follow up for memory loss. Patient is currently on memantine 10 mg twice daily and donepezil 10 mg daily.  Today's MMSE is 22/30, slightly worse from prior in October (24/30).  His long-term memory is affected as well now.  He still able to participate in some activities of daily living, but requires more assistance than prior.      Follow up in 6 months. Increase donepezil to 23 mg daily and memantine 10 mg twice a day, side effects discussed Recommend good control of cardiovascular risk factors  Continue to increase mobility, use the walker for stability Continue to control mood as per PCP    Subjective:    This patient is accompanied in the office by his wife who supplements the history.  Previous records as well as any outside records available were reviewed prior to todays visit. Patient was last seen on 12/17/2021 at which time his MMSE was 24/30.    Any changes in memory since last visit?  His wife reports that his memory may be worse.  Long-term memory is now affected as well.  At times, he cannot remember what state he is seen, or word that he go "a few months back.  He continues to forget recent conversations .  He enjoys playing [employer], advertising, likes to watch YouTube and sports.  He no longer plays in the computer.  At times, he cannot remember how to switch the light off, or where his underwear is (even when looking at the drawer). repeats oneself?  Endorsed "a little worse" Disoriented when walking into a room?  Patient denies except occasionally not remembering what patient came to the room for    Leaving objects in unusual places?  Endorsed.  Wandering behavior?  denies   Any personality changes since last visit?  denies   Any worsening  depression?:  denies   Hallucinations or paranoia?  denies   Seizures?    denies    Any sleep changes?  Denies vivid dreams, REM behavior or sleepwalking   Sleep apnea?   denies   Any hygiene concerns?   He needs to be reminded to shower or dressing. Independent of bathing and dressing?  He needs assistance to tie his shoes otherwise he is essentially independent to change clothes and to bathe. Does the patient needs help with medications?  Wife is in charge Who is in charge of the finances?  Wife is in charge Any changes in appetite?  denies, but he does not like to drink water "because then he has to go to the bathroom". Patient have trouble swallowing?  denies   Does the patient cook?  Any kitchen accidents such as leaving the stove on? Patient denies   Any headaches?   denies   Chronic back pain  denies   Ambulates with difficulty?  denies   Recent falls or head injuries?  He had a recent mechanical fall without injuries, "but does not remember why he did ".  He also does not remember if he used the walker or not during that time.  Unilateral weakness, numbness or tingling?  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 Any tremors?  Very mild, chronic left greater than right mild tremors in the hands, without  affecting his ADLs.  He denies dropping of objects or excessive drooling or other parkinsonian signs. Any anosmia?  Patient denies   Any incontinence of urine?  "Occasional leakage ".  He has a history of prostate cancer, on active surveillance. Any bowel dysfunction?  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  He has a history of rectal discomfort in the setting of hemorrhoids and radiation proctitis per GI note.    Patient lives with his wife Does the patient drive?  He no longer drives.   Initial visit 12/16/20 Eddie Ferguson. Hoffert is a 82 y.o. male  right-handed man, with vascular risk factors, including hypertension, hyperlipidemia, diabetes, with mild  dementia likely due to Alzheimer's disease.  Due to continued progression, and SLUMS score of 16/ 30 in the prior visit, memantine was added on 07/2019 to donepezil 10 mg daily.   He is seen today in follow up for memory loss. This patient is accompanied in the office by his wife who supplements the history.  Previous records as well as any outside records available were reviewed prior to todays visit.     He reports his memory being "fair".  He has more difficulty remembering stories, and his wife states that he has the same questions.  However, she states that he is sharper in the morning, and in the evening is worse at least 2 or 3 times a week.  He continues to live in an independent living community with his wife, but he does not like to "participate much in activities as I used to ".  His mood is "even".  Denies depression or irritability.  He likes to continue playing at the computer, spends significant amount of time there, as well as playing solitaire.  He sleeps well, denies any sleepwalking, vivid dreams, paranoia or hallucinations. He denies leaving objects in unusual places.  He is independent of bathing and dressing, medications and finances.  His appetite is good, denies trouble swallowing.  He does not cook.  He ambulates with the right cane "this is my best friend ", and denies any falls or head trauma recently.  Of note, he tried physical therapy in the recent past, but he according to his wife "he cannot remember the exercises to do, so says he did not enjoy it as much and he does not want to do it anymore ".  He does not drive, his wife takes him to the appointments.  He has chronic numbness in both feet, has a left knee brace.  He denies any headaches, dizziness, diplopia, dysarthria, dysphagia, neck or back pain, focal numbness or tingling or weakness, bowel or bladder dysfunction, anosmia or tremors. PREVIOUS MEDICATIONS:   CURRENT MEDICATIONS:  Outpatient Encounter Medications as of  06/21/2022  Medication Sig   acetaminophen (TYLENOL) 500 MG tablet Take 500 mg by mouth every 6 (six) hours as needed. Reported on 06/16/2015   allopurinol (ZYLOPRIM) 100 MG tablet TAKE 1 TABLET BY MOUTH TWICE A DAY   amLODipine (NORVASC) 5 MG tablet TAKE 1 TABLET BY MOUTH EVERY DAY   benazepril (LOTENSIN) 40 MG tablet TAKE 1 TABLET BY MOUTH EVERY DAY   carvedilol (COREG) 25 MG tablet TAKE 1/2 TABLETS BY MOUTH 2 TIMES DAILY WITH A MEAL.   chlorthalidone (HYGROTON) 25 MG tablet TAKE 1/2 TO 1 TABLET BY MOUTH DAILY   famotidine (PEPCID) 20 MG tablet Take 20 mg by mouth daily.   fexofenadine (ALLEGRA) 180 MG tablet Take 180 mg by mouth.   fluticasone (  FLONASE) 50 MCG/ACT nasal spray Place 2 sprays into both nostrils daily.   hydrocortisone 2.5 % ointment Apply topically 2 (two) times daily. For 7-10 days for hemorrhoid tags- Can reuse in future but need at least 1 week break   KLOR-CON M20 20 MEQ tablet TAKE 2 TABLETS BY MOUTH 2 TIMES DAILY.   levothyroxine (SYNTHROID) 50 MCG tablet TAKE 1 TABLET BY MOUTH EVERY DAY   metFORMIN (GLUCOPHAGE) 500 MG tablet Take 1 tablet (500 mg total) by mouth daily with breakfast.   methocarbamol (ROBAXIN) 750 MG tablet Take 750 mg by mouth 4 (four) times daily.   Multiple Vitamin (MULTIVITAMIN) tablet Take 1 tablet by mouth daily.   pravastatin (PRAVACHOL) 40 MG tablet TAKE 1 TABLET BY MOUTH EVERY DAY   [DISCONTINUED] donepezil (ARICEPT) 10 MG tablet Take 1 tablet (10 mg total) by mouth daily.   [DISCONTINUED] memantine (NAMENDA) 10 MG tablet Take 1 tablet twice a day   donepezil (ARICEPT) 23 MG TABS tablet Take 1 tablet (23 mg total) by mouth daily.   memantine (NAMENDA) 10 MG tablet Take 1 tablet twice a day   No facility-administered encounter medications on file as of 06/21/2022.       06/21/2022    2:00 PM 12/17/2021   11:00 AM 06/16/2021   11:00 AM  MMSE - Mini Mental State Exam  Orientation to time 3 3 5   Orientation to Place 3 4 5   Registration 3 3 3    Attention/ Calculation 5 5 3   Recall 0 0 0  Language- name 2 objects 1 2 2   Language- repeat 1 1 1   Language- follow 3 step command 3 3 3   Language- read & follow direction 1 1 1   Write a sentence 1 1 1   Copy design 1 1 0  Total score 22 24 24       12/16/2020    1:00 PM 05/10/2018    8:00 AM 09/26/2017   10:00 AM 03/27/2017    9:00 AM  Montreal Cognitive Assessment   Visuospatial/ Executive (0/5) 1 2 4 4   Naming (0/3) 2 3 2 3   Attention: Read list of digits (0/2) 2 2 2 2   Attention: Read list of letters (0/1) 1 1 1 1   Attention: Serial 7 subtraction starting at 100 (0/3) 3 3 3 3   Language: Repeat phrase (0/2) 0 2 2 2   Language : Fluency (0/1) 1 1 1 1   Abstraction (0/2) 0 2 2 2   Delayed Recall (0/5) 0 0 0 0  Orientation (0/6) 2 6 6 6   Total 12 22 23 24   Adjusted Score (based on education) 12       Objective:     PHYSICAL EXAMINATION:    VITALS:   Vitals:   06/21/22 1107  BP: (!) 121/59  Pulse: 77  Resp: 18  SpO2: 95%  Weight: 211 lb (95.7 kg)  Height: 5\' 8"  (1.727 m)    GEN:  The patient appears stated age and is in NAD. HEENT:  Normocephalic, atraumatic.   Neurological examination:  General: NAD, well-groomed, appears stated age. Orientation: The patient is alert. Oriented to person, not to place or date. Cranial nerves: There is good facial symmetry.The speech is fluent and clear. No aphasia or dysarthria. Fund of knowledge is reduced. Recent and remote memory are impaired. Attention and concentration are reduced.  Able to name objects and repeat phrases.  Hearing is intact to conversational tone.   Sensation: Sensation is intact to light touch throughout Motor: Strength is  at least antigravity x4. DTR's 2/4 in UE/LE     Movement examination: Tone: There is normal tone in the UE/LE Abnormal movements: Mild left greater than right hand resting tremor.  No myoclonus.  No asterixis.   Coordination:  There is no decremation with RAM's. Normal finger to nose   Gait and Station: The patient has mild difficulty arising out of a deep-seated chair without the use of the hands, uses a walker to ambulate. The patient's stride length is good.  Gait is cautious and narrow.    Thank you for allowing [place] the opportunity to participate in the care of this nice patient. Please do not hesitate to contact us for any questions or concerns.   Total time spent on today's visit was 33 minutes dedicated to this patient today, preparing to see patient, examining the patient, ordering tests and/or medications and counseling the patient, documenting clinical information in the EHR or other health record, independently interpreting results and communicating results to the patient/family, discussing treatment and goals, answering patient's questions and coordinating care.  Cc:  [name], MD  Marlowe Kays 06/21/2022 2:54 PM

## 2022-06-21 NOTE — Patient Instructions (Addendum)
It was a pleasure to see you today at our office.   Recommendations:  Follow up in 6  months Increasing donepezil 23 mg daily. Side effects were discussed  Continue Memantine 10 mg twice daily.Side effects were discussed   Whom to call:  Memory  decline, memory medications: Call our office [phone]   For psychiatric meds, mood meds: Please have your primary care physician manage these medications.   Counseling regarding caregiver distress, including caregiver depression, anxiety and issues regarding community resources, adult day care programs, adult living facilities, or memory care questions:   Feel free to contact  your social worker    For assessment of decision of mental capacity and competency:  Call Dr. Erick Blinks, geriatric psychiatrist at [phone]  For guidance in geriatric dementia issues please call [facility] [phone]  For guidance regarding [facility] and if placement were needed at the facility, contact Sidney Ace, Social Worker tel: [phone]  If you have any severe symptoms of a stroke, or other severe issues such as confusion,severe chills or fever, etc call 911 or go to the ER as you may need to be evaluated further        RECOMMENDATIONS FOR ALL PATIENTS WITH MEMORY PROBLEMS: 1. Continue to exercise (Recommend 30 minutes of walking everyday, or 3 hours every week) 2. Increase social interactions - continue going to [place] and enjoy social gatherings with friends and family 3. Eat healthy, avoid fried foods and eat more fruits and vegetables 4. Maintain adequate blood pressure, blood sugar, and blood cholesterol level. Reducing the risk of stroke and cardiovascular disease also helps promoting better memory. 5. Avoid stressful situations. Live a simple life and avoid aggravations. Organize your time and prepare for the next day in anticipation. 6. Sleep well, avoid any interruptions of sleep and avoid any  distractions in the bedroom that may interfere with adequate sleep quality 7. Avoid sugar, avoid sweets as there is a strong link between excessive sugar intake, diabetes, and cognitive impairment We discussed the Mediterranean diet, which has been shown to help patients reduce the risk of progressive memory disorders and reduces cardiovascular risk. This includes eating fish, eat fruits and green leafy vegetables, nuts like almonds and hazelnuts, walnuts, and also use olive oil. Avoid fast foods and fried foods as much as possible. Avoid sweets and sugar as sugar use has been linked to worsening of memory function.  There is always a concern of gradual progression of memory problems. If this is the case, then we may need to adjust level of care according to patient needs. Support, both to the patient and caregiver, should then be put into place.    The Alzheimer's Association is here all day, every day for people facing Alzheimer's disease through our free 24/7 Helpline: [phone]. The Helpline provides reliable information and support to all those who need assistance, such as individuals living with memory loss, Alzheimer's or other dementia, caregivers, health care professionals and the public.  Our highly trained and knowledgeable staff can help you with: Understanding memory loss, dementia and Alzheimer's  Medications and other treatment options  General information about aging and brain health  Skills to provide quality care and to find the best care from professionals  Legal, financial and living-arrangement decisions Our Helpline also features: Confidential care consultation provided by master's level clinicians who can help with decision-making support, crisis assistance and education on issues families face every day  Help in a caller's preferred language using  our translation service that features more than 200 languages and dialects  Referrals to local community programs, services and  ongoing support     FALL PRECAUTIONS: Be cautious when walking. Scan the area for obstacles that may increase the risk of trips and falls. When getting up in the mornings, sit up at the edge of the bed for a few minutes before getting out of bed. Consider elevating the bed at the head end to avoid drop of blood pressure when getting up. Walk always in a well-lit room (use night lights in the walls). Avoid area rugs or power cords from appliances in the middle of the walkways. Use a walker or a cane if necessary and consider physical therapy for balance exercise. Get your eyesight checked regularly.  FINANCIAL OVERSIGHT: Supervision, especially oversight when making financial decisions or transactions is also recommended.  HOME SAFETY: Consider the safety of the kitchen when operating appliances like stoves, microwave oven, and blender. Consider having supervision and share cooking responsibilities until no longer able to participate in those. Accidents with firearms and other hazards in the house should be identified and addressed as well.   ABILITY TO BE LEFT ALONE: If patient is unable to contact 911 operator, consider using LifeLine, or when the need is there, arrange for someone to stay with patients. Smoking is a fire hazard, consider supervision or cessation. Risk of wandering should be assessed by caregiver and if detected at any point, supervision and safe proof recommendations should be instituted.  MEDICATION SUPERVISION: Inability to self-administer medication needs to be constantly addressed. Implement a mechanism to ensure safe administration of the medications.       Mediterranean Diet A Mediterranean diet refers to food and lifestyle choices that are based on the traditions of countries located on the [employer] [place]. This way of eating has been shown to help prevent certain conditions and improve outcomes for people who have chronic diseases, like kidney disease and heart  disease. What are tips for following this plan? Lifestyle  Cook and eat meals together with your family, when possible. Drink enough fluid to keep your urine clear or pale yellow. Be physically active every day. This includes: Aerobic exercise like running or swimming. Leisure activities like gardening, walking, or housework. Get 7-8 hours of sleep each night. If recommended by your health care provider, drink red wine in moderation. This means 1 glass a day for nonpregnant women and 2 glasses a day for men. A glass of wine equals 5 oz (150 mL). Reading food labels  Check the serving size of packaged foods. For foods such as rice and pasta, the serving size refers to the amount of cooked product, not dry. Check the total fat in packaged foods. Avoid foods that have saturated fat or trans fats. Check the ingredients list for added sugars, such as corn syrup. Shopping  At the grocery store, buy most of your food from the areas near the walls of the store. This includes: Fresh fruits and vegetables (produce). Grains, beans, nuts, and seeds. Some of these may be available in unpackaged forms or large amounts (in bulk). Fresh seafood. Poultry and eggs. Low-fat dairy products. Buy whole ingredients instead of prepackaged foods. Buy fresh fruits and vegetables in-season from local farmers markets. Buy frozen fruits and vegetables in resealable bags. If you do not have access to quality fresh seafood, buy precooked frozen shrimp or canned fish, such as tuna, salmon, or sardines. Buy small amounts of raw or cooked vegetables, salads,  or olives from the deli or salad bar at your store. Stock your pantry so you always have certain foods on hand, such as olive oil, canned tuna, canned tomatoes, rice, pasta, and beans. Cooking  Cook foods with extra-virgin olive oil instead of using butter or other vegetable oils. Have meat as a side dish, and have vegetables or grains as your main dish. This means  having meat in small portions or adding small amounts of meat to foods like pasta or stew. Use beans or vegetables instead of meat in common dishes like chili or lasagna. Experiment with different cooking methods. Try roasting or broiling vegetables instead of steaming or sauteing them. Add frozen vegetables to soups, stews, pasta, or rice. Add nuts or seeds for added healthy fat at each meal. You can add these to yogurt, salads, or vegetable dishes. Marinate fish or vegetables using olive oil, lemon juice, garlic, and fresh herbs. Meal planning  Plan to eat 1 vegetarian meal one day each week. Try to work up to 2 vegetarian meals, if possible. Eat seafood 2 or more times a week. Have healthy snacks readily available, such as: Vegetable sticks with hummus. Greek yogurt. Fruit and nut trail mix. Eat balanced meals throughout the week. This includes: Fruit: 2-3 servings a day Vegetables: 4-5 servings a day Low-fat dairy: 2 servings a day Fish, poultry, or lean meat: 1 serving a day Beans and legumes: 2 or more servings a week Nuts and seeds: 1-2 servings a day Whole grains: 6-8 servings a day Extra-virgin olive oil: 3-4 servings a day Limit red meat and sweets to only a few servings a month What are my food choices? Mediterranean diet Recommended Grains: Whole-grain pasta. Brown rice. Bulgar wheat. Polenta. Couscous. Whole-wheat bread. Orpah Cobb. Vegetables: Artichokes. Beets. Broccoli. Cabbage. Carrots. Eggplant. Green beans. Chard. Kale. Spinach. Onions. Leeks. Peas. Squash. Tomatoes. Peppers. Radishes. Fruits: Apples. Apricots. Avocado. Berries. Bananas. Cherries. Dates. Figs. Grapes. Lemons. Melon. Oranges. Peaches. Plums. Pomegranate. Meats and other protein foods: Beans. Almonds. Sunflower seeds. Pine nuts. Peanuts. Cod. Salmon. Scallops. Shrimp. Tuna. Tilapia. Clams. Oysters. Eggs. Dairy: Low-fat milk. Cheese. Greek yogurt. Beverages: Water. Red wine. Herbal tea. Fats and  oils: Extra virgin olive oil. Avocado oil. Grape seed oil. Sweets and desserts: [place] yogurt with honey. Baked apples. Poached pears. Trail mix. Seasoning and other foods: Basil. Cilantro. Coriander. Cumin. Mint. Parsley. Sage. Rosemary. Tarragon. Garlic. Oregano. Thyme. Pepper. Balsalmic vinegar. Tahini. Hummus. Tomato sauce. Olives. Mushrooms. Limit these Grains: Prepackaged pasta or rice dishes. Prepackaged cereal with added sugar. Vegetables: Deep fried potatoes (french fries). Fruits: Fruit canned in syrup. Meats and other protein foods: Beef. Pork. Lamb. Poultry with skin. Hot dogs. Tomasa Blase. Dairy: Ice cream. Sour cream. Whole milk. Beverages: Juice. Sugar-sweetened soft drinks. Beer. Liquor and spirits. Fats and oils: Butter. Canola oil. Vegetable oil. Beef fat (tallow). Lard. Sweets and desserts: Cookies. Cakes. Pies. Candy. Seasoning and other foods: Mayonnaise. Premade sauces and marinades. The items listed may not be a complete list. Talk with your dietitian about what dietary choices are right for you. Summary The Mediterranean diet includes both food and lifestyle choices. Eat a variety of fresh fruits and vegetables, beans, nuts, seeds, and whole grains. Limit the amount of red meat and sweets that you eat. Talk with your health care provider about whether it is safe for you to drink red wine in moderation. This means 1 glass a day for nonpregnant women and 2 glasses a day for men. A glass of wine equals 5  oz (150 mL). This information is not intended to replace advice given to you by your health care provider. Make sure you discuss any questions you have with your health care provider. Document Released: 10/15/2015 Document Revised: 11/17/2015 Document Reviewed: 10/15/2015 Elsevier Interactive Patient Education  2017 [employer].

## 2022-06-28 ENCOUNTER — Other Ambulatory Visit (INDEPENDENT_AMBULATORY_CARE_PROVIDER_SITE_OTHER): Payer: Medicare HMO

## 2022-06-28 ENCOUNTER — Encounter: Payer: Self-pay | Admitting: Sports Medicine

## 2022-06-28 ENCOUNTER — Ambulatory Visit: Payer: Medicare HMO | Admitting: Sports Medicine

## 2022-06-28 DIAGNOSIS — M25562 Pain in left knee: Secondary | ICD-10-CM

## 2022-06-28 DIAGNOSIS — M1712 Unilateral primary osteoarthritis, left knee: Secondary | ICD-10-CM | POA: Diagnosis not present

## 2022-06-28 DIAGNOSIS — G8929 Other chronic pain: Secondary | ICD-10-CM | POA: Diagnosis not present

## 2022-06-28 DIAGNOSIS — M160 Bilateral primary osteoarthritis of hip: Secondary | ICD-10-CM

## 2022-06-28 DIAGNOSIS — G309 Alzheimer's disease, unspecified: Secondary | ICD-10-CM

## 2022-06-28 DIAGNOSIS — M25552 Pain in left hip: Secondary | ICD-10-CM | POA: Diagnosis not present

## 2022-06-28 DIAGNOSIS — F0280 Dementia in other diseases classified elsewhere without behavioral disturbance: Secondary | ICD-10-CM

## 2022-06-28 NOTE — Progress Notes (Addendum)
Eddie Ferguson - 82 y.o. male MRN [id number]  Date of birth: 26-Apr-1940  Office Visit Note: Visit Date: 06/28/2022 PCP: Eddie Majestic, MD Referred by: Eddie Majestic, MD  Subjective: Chief Complaint  Patient presents with   Right Hip - Pain   Left Hip - Pain   Left Knee - Pain   HPI: Eddie Ferguson is a pleasant 82 y.o. male who presents today for chronic bilateral hip pain, acute on chronic left knee pain.  His wife is with him today and she did provide some of the HPI due to patient's advanced dementia. His last A1c was 6.9 on 05/25/22. Performed US-guided R-hip injection back on 02/23/23 - doing well with this up until just a week or so ago.  He has had osteoarthritis of the left knee, wears a brace consistently with this.  It is somewhat painful and mildly swollen, although no acute changes.  His wife states that he has been turning in bed because of his hip pain.  The pain will alternate, but is in both hips.  Does feel like when hip is worse than the other, believes this to be the left hip but is unsure. Does use tylenol for pain control.  Pertinent ROS were reviewed with the patient and found to be negative unless otherwise specified above in HPI.   Assessment & Plan: Visit Diagnoses:  1. Chronic pain of left knee   2. Pain in left hip   3. Unilateral primary osteoarthritis, left knee   4. Bilateral primary osteoarthritis of hip   5. Dementia due to Alzheimer's disease    Plan: Discussed with Eddie Ferguson and his wife today the nature of his bilateral hip and knee pain.  He does have severe bone-on-bone tricompartmental arthritic change of the left knee, however per the patient and his wife he is not at a place for surgery/knee replacement is an option.  Through shared decision-making, did elect to proceed with corticosteroid injection into the left knee, patient tolerated well.  He may use ice as well as Tylenol for any postinjection pain.  In terms of his hip pain, he  does have rather advanced right hip arthritis, left has mild to moderate arthritic change.  Beneficial to get him into some home exercises or some formalized physical therapy, this may be somewhat limited due to his Alzheimer's.  He will continue Tylenol as needed.  See how he does in about 2 weeks and we will consider ultrasound-guided corticosteroid injection into either 1 or both hips depending on how his pain is doing.  He and his wife are agreeable to this plan. F/u in 2 weeks for re-evaluation  Follow-up: Return in about 2 weeks for b/l hips   Meds & Orders: No orders of the defined types were placed in this encounter.   Orders Placed This Encounter  Procedures   XR HIP UNILAT W OR W/O PELVIS 2-3 VIEWS LEFT   XR Knee Complete 4 Views Left     Procedures: Large Joint Inj: L knee on 09/11/2022 9:06 PM Indications: pain Details: 22 G 1.5 in needle, anteromedial approach Medications: 2 mL lidocaine 1 %; 2 mL bupivacaine 0.25 %; 40 mg methylPREDNISolone acetate 40 MG/ML Outcome: tolerated well, no immediate complications  Knee Injection, left: After discussion on risks/benefits/indications, informed verbal consent was obtained and a timeout was performed, patient was seated on exam table. The patient's knee was prepped with Betadine and alcohol swab and utilizing anteromedial approach, the patient's knee was  injected intraarticularly with 2:2:1 lidocaine 1%:bupivicaine 0.25%:depomedrol. Patient tolerated the procedure well without immediate complications.  Procedure, treatment alternatives, risks and benefits explained, specific risks discussed. Consent was given by the patient. Immediately prior to procedure a time out was called to verify the correct patient, procedure, equipment, support staff and site/side marked as required. Patient was prepped and draped in the usual sterile fashion.          Clinical History: No specialty comments available.  He reports that he quit smoking about  21 years ago. His smoking use included cigarettes. He has never used smokeless tobacco.  Recent Labs    11/26/21 1557 05/25/22 1048  HGBA1C 7.6* 6.9*  LABURIC  --  5.4    Objective:   Vital Signs: There were no vitals taken for this visit.  Physical Exam  Gen: Well-appearing, in no acute distress; non-toxic CV: Well-perfused. Warm.  Resp: Breathing unlabored on room air; no wheezing. Psych: Fluid speech in conversation; appropriate affect; pleasantly demented Neuro: Sensation intact throughout. No gross coordination deficits.   Ortho Exam - Bilateral hips: Patient walks with a slow shuffling gait with the assistance of a walker.  There is limited internal and external rotation right greater than left.   - Left knee: There is a small effusion noted of the knee joint.  There is mild valgus collapse.  There is significant crepitus, range of motion from 5-115 degrees.  No significant TTP.    Imaging: XR HIP UNILAT W OR W/O PELVIS 2-3 VIEWS LEFT  Result Date: 06/28/2022 2 views of the left hip including AP and lateral femoral ordered and reviewed by myself.  X-rays demonstrate mild to moderate arthritic change and joint space narrowing of the left hip.  There is a spur of the acetabular rim and a small drop osteophyte off the inferior femoral head.  There is near severe arthritic change of the right hip joint with sclerosis.  XR Knee Complete 4 Views Left  Result Date: 06/28/2022 4 views of the left knee including standing AP, Rosenberg, lateral and sunrise view were ordered and reviewed by myself.  X-rays demonstrate severe bone-on-bone tricompartmental collapse and osteoarthritic change.  There is notable sclerosis and spurring at the medial and lateral tibiofemoral joint.    Past Medical/Family/Surgical/Social History: Medications & Allergies reviewed per EMR, new medications updated. Patient Active Problem List   Diagnosis Date Noted   Pain due to onychomycosis of toenails of both  feet 08/17/2020   Porokeratosis 08/17/2020   Positive FIT (fecal immunochemical test) 06/17/2020   Intermittent diarrhea 06/17/2020   Aortic atherosclerosis 01/05/2018   CKD (chronic kidney disease), stage III 04/18/2017   Gout 07/09/2015   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 [facility]le Patter [facility] 05/28/2014   Osteoarthritis 05/28/2014   Erectile dysfunction 05/28/2014   Basal cell carcinoma 05/28/2014   Essential hypertension 04/23/2014   Hyperlipidemia 04/23/2014   GERD (gastroesophageal reflux disease) 04/23/2014   Type 2 diabetes mellitus with diabetic chronic kidney disease 04/23/2014   Hypothyroidism 04/23/2014   Allergic rhinitis 04/23/2014   Glaucoma 04/23/2014   History of nonmelanoma skin cancer 11/18/2012   History of shingles 04/24/2011   Past Medical History:  Diagnosis Date   Allergy    Ankle injury 06/01/2001   no surgery- skin debridement    Arthritis    Basal cell carcinoma 05/28/2014   L nasal tip. Mohs Dr. Park Liter.  Blood transfusion    2010 because of stomach ulcers   Cataract    bilaterally removed   Diabetes mellitus    GERD (gastroesophageal reflux disease)    Glaucoma    Hyperlipidemia    Hypertension    Hypothyroidism    Pneumonia 04/12/2006   Prostate cancer    Seasonal allergies    Sinusitis    treated for bacterial infection at least once a year at [facility]   Stomach ulcer 2010   bleeding ulcer result NSAIDS   Family History  Problem Relation Age of Onset   Diabetes Mother    Diabetes Father    Hyperlipidemia Father    Colon polyps Father    Brain cancer Brother        smoker   Dementia Paternal Grandmother    Colon cancer Neg Hx    Esophageal cancer Neg Hx    Stomach cancer Neg Hx    Rectal cancer Neg Hx    Past Surgical History:  Procedure Laterality Date   abdominl abcess     03-15-2011   BACK SURGERY     CATARACT  EXTRACTION, BILATERAL     04-07-2014, 03-17-2014   COLONOSCOPY     2012, 5 year repeat   CYSTOURETHROSCOPY  11/11/1998   ESOPHAGOGASTRODUODENOSCOPY  2010   PUD   LUMBAR LAMINECTOMY  2007   [place]   MOHS SURGERY     left side of nose   POLYPECTOMY     PROSTATE BIOPSY     radiation 2016   RECONSTRUCTION TENDON PULLEY W/ TENDON / FASCIAL GRAFT OF HAND / FINGER  1982   rt 3rd finger   RECONSTRUCTION TENDON PULLEY W/ TENDON / FASCIAL GRAFT OF HAND / FINGER  1953   rt 5th finger   UPPER GASTROINTESTINAL ENDOSCOPY     Social History   Occupational History   Occupation: retired  Tobacco Use   Smoking status: Former    Types: Cigarettes    Quit date: 02/03/2001    Years since quitting: 21.4   Smokeless tobacco: Never   Tobacco comments:    intermittent cigar use stopped  Vaping Use   Vaping Use: Never used  Substance and Sexual Activity   Alcohol use: Yes    Alcohol/week: 0.0 standard drinks of alcohol    Comment: occasional beer   Drug use: No   Sexual activity: Not Currently    Partners: Female   I spent 36 minutes in the care of the patient today including face-to-face time, preparation to see the patient, as well as reviewing imaging with the patient and his wife in the room; counseling of pricing.  Home exercise treatment; independent interpretation with his wife and communication given the patient's Alzheimer's dementia for the above diagnoses.   Madelyn Brunner, DO Primary Care Sports Medicine Physician  [facility]  This note was dictated using Dragon naturally speaking software and may contain errors in syntax, spelling, or content which have not been identified prior to signing this note.

## 2022-06-29 DIAGNOSIS — Z8546 Personal history of malignant neoplasm of prostate: Secondary | ICD-10-CM | POA: Diagnosis not present

## 2022-07-04 DIAGNOSIS — Z961 Presence of intraocular lens: Secondary | ICD-10-CM | POA: Diagnosis not present

## 2022-07-04 DIAGNOSIS — E119 Type 2 diabetes mellitus without complications: Secondary | ICD-10-CM | POA: Diagnosis not present

## 2022-07-04 DIAGNOSIS — I10 Essential (primary) hypertension: Secondary | ICD-10-CM | POA: Diagnosis not present

## 2022-07-04 DIAGNOSIS — Z9849 Cataract extraction status, unspecified eye: Secondary | ICD-10-CM | POA: Diagnosis not present

## 2022-07-04 DIAGNOSIS — H52223 Regular astigmatism, bilateral: Secondary | ICD-10-CM | POA: Diagnosis not present

## 2022-07-04 DIAGNOSIS — H35033 Hypertensive retinopathy, bilateral: Secondary | ICD-10-CM | POA: Diagnosis not present

## 2022-07-04 DIAGNOSIS — H53143 Visual discomfort, bilateral: Secondary | ICD-10-CM | POA: Diagnosis not present

## 2022-07-04 DIAGNOSIS — H40013 Open angle with borderline findings, low risk, bilateral: Secondary | ICD-10-CM | POA: Diagnosis not present

## 2022-07-04 DIAGNOSIS — H524 Presbyopia: Secondary | ICD-10-CM | POA: Diagnosis not present

## 2022-07-04 DIAGNOSIS — H40053 Ocular hypertension, bilateral: Secondary | ICD-10-CM | POA: Diagnosis not present

## 2022-07-04 DIAGNOSIS — H5203 Hypermetropia, bilateral: Secondary | ICD-10-CM | POA: Diagnosis not present

## 2022-07-04 LAB — HM DIABETES EYE EXAM

## 2022-07-06 DIAGNOSIS — Z8546 Personal history of malignant neoplasm of prostate: Secondary | ICD-10-CM | POA: Diagnosis not present

## 2022-07-13 DIAGNOSIS — H6123 Impacted cerumen, bilateral: Secondary | ICD-10-CM | POA: Diagnosis not present

## 2022-07-13 DIAGNOSIS — Z6832 Body mass index (BMI) 32.0-32.9, adult: Secondary | ICD-10-CM | POA: Diagnosis not present

## 2022-07-13 DIAGNOSIS — I10 Essential (primary) hypertension: Secondary | ICD-10-CM | POA: Diagnosis not present

## 2022-08-08 ENCOUNTER — Telehealth: Payer: Self-pay | Admitting: Family Medicine

## 2022-08-08 NOTE — Telephone Encounter (Signed)
Both on file- this seems to be suspicious to me though- how can we verify he's actually with [employer]? Also they should be able to see this from labs that were drawn if it is [employer] so confusing

## 2022-08-08 NOTE — Telephone Encounter (Signed)
Eddie Ferguson with [employer] requests to be called on behalf of Patient to be advised if Patient has completed EGFR blood test and UACR Urine test for [facility]

## 2022-08-09 NOTE — Telephone Encounter (Signed)
Can you help with this? Technically we can't verify this information correct since not on DPR? Could be scam.

## 2022-08-17 NOTE — Telephone Encounter (Signed)
Please have [employer] reach out to the HIM department.  Thanks.

## 2022-08-17 NOTE — Telephone Encounter (Signed)
LVM on Criss Alvine, [employer] representative, VM to contact our HIM department in regards to this. Provided him with the number [phone] and informed him to callback if needed.

## 2022-08-18 ENCOUNTER — Encounter: Payer: Self-pay | Admitting: Family Medicine

## 2022-08-18 ENCOUNTER — Ambulatory Visit (INDEPENDENT_AMBULATORY_CARE_PROVIDER_SITE_OTHER): Payer: Medicare HMO | Admitting: Family Medicine

## 2022-08-18 VITALS — BP 110/60 | HR 80 | Temp 97.8°F | Ht 68.0 in | Wt 202.4 lb

## 2022-08-18 DIAGNOSIS — M25511 Pain in right shoulder: Secondary | ICD-10-CM

## 2022-08-18 DIAGNOSIS — G8929 Other chronic pain: Secondary | ICD-10-CM | POA: Diagnosis not present

## 2022-08-18 DIAGNOSIS — I10 Essential (primary) hypertension: Secondary | ICD-10-CM

## 2022-08-18 DIAGNOSIS — F0280 Dementia in other diseases classified elsewhere without behavioral disturbance: Secondary | ICD-10-CM

## 2022-08-18 DIAGNOSIS — M170 Bilateral primary osteoarthritis of knee: Secondary | ICD-10-CM | POA: Diagnosis not present

## 2022-08-18 DIAGNOSIS — G309 Alzheimer's disease, unspecified: Secondary | ICD-10-CM | POA: Diagnosis not present

## 2022-08-18 DIAGNOSIS — R29898 Other symptoms and signs involving the musculoskeletal system: Secondary | ICD-10-CM

## 2022-08-18 NOTE — Patient Instructions (Addendum)
Please go to [facility]  - located [address] across the street from [place] - in the basement - Hours: 8:30-5:00 PM M-F (with lunch from 12:30- 1 PM). You do NOT need an appointment.    See if you can schedule physical therapy visit at the desk at our office  Recommended follow up: Return for next already scheduled visit or sooner if needed.

## 2022-08-18 NOTE — Progress Notes (Signed)
Phone [phone] In person visit   Subjective:   Eddie Ferguson. Otero is a 82 y.o. year old very pleasant male patient who presents for/with See problem oriented charting Chief Complaint  Patient presents with   Joint Pain    Pt c/o joint pain in bilateral knees and right shoulder and bilateral arms. He thinks this may be arthritis.   Past Medical History-  Patient Active Problem List   Diagnosis Date Noted   Dementia due to Alzheimer's disease (HCC) 06/16/2015    Priority: High   History of prostate cancer-follows with Dr. [facility]verle Patter of [facility] 05/28/2014    Priority: High   Type 2 diabetes mellitus with diabetic chronic kidney disease (HCC) 04/23/2014    Priority: High   Aortic atherosclerosis (HCC) 01/05/2018    Priority: Medium    CKD (chronic kidney disease), stage III (HCC) 04/18/2017    Priority: Medium    Gout 07/09/2015    Priority: Medium    Essential hypertension 04/23/2014    Priority: Medium    Hyperlipidemia 04/23/2014    Priority: Medium    Hypothyroidism 04/23/2014    Priority: Medium    Adenomatous colon polyp 05/28/2014    Priority: Low   Osteoarthritis 05/28/2014    Priority: Low   Erectile dysfunction 05/28/2014    Priority: Low   Basal cell carcinoma 05/28/2014    Priority: Low   GERD (gastroesophageal reflux disease) 04/23/2014    Priority: Low   Allergic rhinitis 04/23/2014    Priority: Low   Glaucoma 04/23/2014    Priority: Low   History of nonmelanoma skin cancer 11/18/2012    Priority: Low   Pain due to onychomycosis of toenails of both feet 08/17/2020   Porokeratosis 08/17/2020   Positive FIT (fecal immunochemical test) 06/17/2020   Intermittent diarrhea 06/17/2020   History of shingles 04/24/2011    Medications- reviewed and updated Current Outpatient Medications  Medication Sig Dispense Refill   acetaminophen (TYLENOL) 500 MG tablet Take 500 mg by mouth every 6 (six) hours as needed. Reported on 06/16/2015     allopurinol  (ZYLOPRIM) 100 MG tablet TAKE 1 TABLET BY MOUTH TWICE A DAY 180 tablet 3   amLODipine (NORVASC) 5 MG tablet TAKE 1 TABLET BY MOUTH EVERY DAY 90 tablet 1   benazepril (LOTENSIN) 40 MG tablet TAKE 1 TABLET BY MOUTH EVERY DAY 90 tablet 3   carvedilol (COREG) 25 MG tablet TAKE 1/2 TABLETS BY MOUTH 2 TIMES DAILY WITH A MEAL. 90 tablet 3   chlorthalidone (HYGROTON) 25 MG tablet TAKE 1/2 TO 1 TABLET BY MOUTH DAILY 90 tablet 1   donepezil (ARICEPT) 23 MG TABS tablet Take 1 tablet (23 mg total) by mouth daily. 90 tablet 3   famotidine (PEPCID) 20 MG tablet Take 20 mg by mouth daily.     fexofenadine (ALLEGRA) 180 MG tablet Take 180 mg by mouth.     fluticasone (FLONASE) 50 MCG/ACT nasal spray Place 2 sprays into both nostrils daily. 48 g 3   hydrocortisone 2.5 % ointment Apply topically 2 (two) times daily. For 7-10 days for hemorrhoid tags- Can reuse in future but need at least 1 week break 30 g 2   KLOR-CON M20 20 MEQ tablet TAKE 2 TABLETS BY MOUTH 2 TIMES DAILY. 360 tablet 3   levothyroxine (SYNTHROID) 50 MCG tablet TAKE 1 TABLET BY MOUTH EVERY DAY 90 tablet 3   memantine (NAMENDA) 10 MG tablet Take 1 tablet twice a day 180 tablet 3   metFORMIN (GLUCOPHAGE)  500 MG tablet Take 1 tablet (500 mg total) by mouth daily with breakfast. 90 tablet 3   methocarbamol (ROBAXIN) 750 MG tablet Take 750 mg by mouth 4 (four) times daily.     Multiple Vitamin (MULTIVITAMIN) tablet Take 1 tablet by mouth daily.     pravastatin (PRAVACHOL) 40 MG tablet TAKE 1 TABLET BY MOUTH EVERY DAY 90 tablet 3   No current facility-administered medications for this visit.     Objective:  BP 110/60   Pulse 80   Temp 97.8 F (36.6 C)   Ht 5\' 8"  (1.727 m)   Wt 202 lb 6.4 oz (91.8 kg)   SpO2 94%   BMI 30.77 kg/m  Gen: NAD, resting comfortably CV: RRR no murmurs rubs or gallops Lungs: CTAB no crackles, wheeze, rhonchi Ext: trace edema, dry feet Skin: warm, dry Musculoskeletal: empty can positive on right, hawken test  produces significant pain right compared to left  Diabetic Foot Exam - Simple   Simple Foot Form Diabetic Foot exam was performed with the following findings: Yes 08/18/2022  3:15 PM  Visual Inspection No deformities, no ulcerations, no other skin breakdown bilaterally: Yes Sensation Testing Intact to touch and monofilament testing bilaterally: Yes Pulse Check Posterior Tibialis and Dorsalis pulse intact bilaterally: Yes Comments Pulses at least 1+        Assessment and Plan   # Joint pain S: - long term issues with left knee- had injection 06/28/22 with Dr. Shon Baton. Hard to know what level of improvement of pain he had. Difficult historian with wife.  -recently complaining more about right shoulder and down into his trapezius- wife has seen him grab at the shoulder multiple times -also has pain in hands -feels weak in legs and they seem to give out at times- using a walker- no recent falls. Does have known osteoarthritis of knees -no specific stiffness. No morning specific issues and does not improve as day goes on A/P: multiple areas of arthralgia and myalgias  - for the right shoulder we opted to get baseline x-rays and also refer to physical therapy -for bilateral knee arthritis as well as weakness concern- refer to physical therapy for their opinion as well  #weight loss- weight down 9 lbs since last visit-could update bloodwork but decided to discuss at visit next month and monitor trend- more extensive unintentional weight loss workup can be put in place if worsens further   # Dementia- sees Dr. Karel Jarvis but primarily sees Marcelline Deist, [place] S:medication:  aricept 23  mg daily and namenda 10 mg twice daily - there have been some possible delusions or hallucinations- patient in particular in one case thought some men were looking at and talking about his wife in what sounds to be sexual manner- has really distressed his wife who has been battlign her own issues. She is concerned he is  hearing things that are not happening A/P: wife plans to talk about these concerns with Marlowe Kays, PA at next visit- discussed its possible he misheard or misinterpreted vs could have delusions or hallucinations- not clear at this point. Discussed if they are hallucinations antipsychotics can be used but may affect mortality- no change in current plan was made. Another thought I have is on metformin to check B12 next visit to see if further contributing to memory issues if low    #hypertension S: medication: Benazepril 40 mg, chlorthalidone 25 mg, amlodipine 5 mg, carvedilol 12.5 mg twice daily.  Does need potassium with chlorthalidone  BP  Readings from Last 3 Encounters:  08/18/22 110/60  06/21/22 (!) 121/59  05/25/22 138/78  A/P: stable- continue current medicines     Recommended follow up: Return for next already scheduled visit or sooner if needed. Future Appointments  Date Time Provider Department Center  09/01/2022  1:15 PM Helane Gunther, DPM [facility]  09/26/2022  3:00 PM [name], MD [facility]  12/21/2022 11:30 AM Marcos Eke, PA-C [facility] None  03/21/2023 11:15 AM [facility] ANNUAL WELLNESS VISIT 1 [facility]    Lab/Order associations:   ICD-10-CM   1. Chronic right shoulder pain  M25.511 DG Shoulder Right   G89.29 Ambulatory referral to Physical Therapy    2. Primary osteoarthritis of both knees  M17.0 Ambulatory referral to Physical Therapy    3. Weakness of both lower extremities  R29.898 Ambulatory referral to Physical Therapy    4. Dementia due to Alzheimer's disease (HCC)  G30.9    F02.80     5. Essential hypertension  I10       Return precautions advised.  Tana Conch, MD

## 2022-08-23 ENCOUNTER — Ambulatory Visit (INDEPENDENT_AMBULATORY_CARE_PROVIDER_SITE_OTHER)
Admission: RE | Admit: 2022-08-23 | Discharge: 2022-08-23 | Disposition: A | Discharge status: Home / Self Care | Payer: Medicare HMO | Source: Ambulatory Visit | Attending: Family Medicine | Admitting: Family Medicine

## 2022-08-23 DIAGNOSIS — G8929 Other chronic pain: Secondary | ICD-10-CM

## 2022-08-23 DIAGNOSIS — H6123 Impacted cerumen, bilateral: Secondary | ICD-10-CM | POA: Diagnosis not present

## 2022-08-23 DIAGNOSIS — M25511 Pain in right shoulder: Secondary | ICD-10-CM | POA: Diagnosis not present

## 2022-08-23 DIAGNOSIS — M19011 Primary osteoarthritis, right shoulder: Secondary | ICD-10-CM | POA: Diagnosis not present

## 2022-08-29 ENCOUNTER — Ambulatory Visit: Payer: Medicare HMO | Admitting: Physical Therapy

## 2022-08-29 DIAGNOSIS — M25562 Pain in left knee: Secondary | ICD-10-CM

## 2022-08-29 DIAGNOSIS — M25511 Pain in right shoulder: Secondary | ICD-10-CM | POA: Diagnosis not present

## 2022-08-29 DIAGNOSIS — R2689 Other abnormalities of gait and mobility: Secondary | ICD-10-CM | POA: Diagnosis not present

## 2022-08-29 DIAGNOSIS — M25551 Pain in right hip: Secondary | ICD-10-CM | POA: Diagnosis not present

## 2022-08-29 DIAGNOSIS — G8929 Other chronic pain: Secondary | ICD-10-CM | POA: Diagnosis not present

## 2022-08-29 NOTE — Therapy (Unsigned)
OUTPATIENT PHYSICAL THERAPY UPPER EXTREMITY EVALUATION   Patient Name: Eddie Ferguson. Espina MRN: [id number] DOB:1940-04-19, 82 y.o., male Today's Date: 08/29/2022  END OF SESSION:   Past Medical History:  Diagnosis Date   Allergy    Ankle injury 06/01/2001   no surgery- skin debridement    Arthritis    Basal cell carcinoma 05/28/2014   L nasal tip. Mohs Dr. Park Liter.     Blood transfusion    2010 because of stomach ulcers   Cataract    bilaterally removed   Diabetes mellitus    GERD (gastroesophageal reflux disease)    Glaucoma    Hyperlipidemia    Hypertension    Hypothyroidism    Pneumonia 04/12/2006   Prostate cancer (HCC)    Seasonal allergies    Sinusitis    treated for bacterial infection at least once a year at [facility]   Stomach ulcer 2010   bleeding ulcer result NSAIDS   Past Surgical History:  Procedure Laterality Date   abdominl abcess     03-15-2011   BACK SURGERY     CATARACT EXTRACTION, BILATERAL     04-07-2014, 03-17-2014   COLONOSCOPY     2012, 5 year repeat   CYSTOURETHROSCOPY  11/11/1998   ESOPHAGOGASTRODUODENOSCOPY  2010   PUD   LUM[place]OMY  [place]   MOHS SURGERY     left side of nose   POLYPECTOMY     PROSTATE BIOPSY     radiation 2016   RECONSTRUCTION TENDON PULLEY W/ TENDON / FASCIAL GRAFT OF HAND / FINGER  1982   rt 3rd finger   RECONSTRUCTION TENDON PULLEY W/ TENDON / FASCIAL GRAFT OF HAND / FINGER  1953   rt 5th finger   UPPER GASTROINTESTINAL ENDOSCOPY     Patient Active Problem List   Diagnosis Date Noted   Pain due to onychomycosis of toenails of both feet 08/17/2020   Porokeratosis 08/17/2020   Positive FIT (fecal immunochemical test) 06/17/2020   Intermittent diarrhea 06/17/2020   Aortic atherosclerosis (HCC) 01/05/2018   CKD (chronic kidney disease), stage III (HCC) 04/18/2017   Gout 07/09/2015   Dementia due to Alzheimer's disease (HCC) 06/16/2015   Adenomatous colon polyp 05/28/2014   History of prostate  cancer-follows with Dr. Laverle Patter of [facility] 05/28/2014   Osteoarthritis 05/28/2014   Erectile dysfunction 05/28/2014   Basal cell carcinoma 05/28/2014   Essential hypertension 04/23/2014   Hyperlipidemia 04/23/2014   GERD (gastroesophageal reflux disease) 04/23/2014   Type 2 diabetes mellitus with diabetic chronic kidney disease (HCC) 04/23/2014   Hypothyroidism 04/23/2014   Allergic rhinitis 04/23/2014   Glaucoma 04/23/2014   History of nonmelanoma skin cancer 11/18/2012   History of shingles 04/24/2011    PCP: ***  REFERRING PROVIDER: ***  REFERRING DIAG: ***  THERAPY DIAG:  No diagnosis found.  Rationale for Evaluation and Treatment: Rehabilitation  ONSET DATE:   SUBJECTIVE:  SUBJECTIVE STATEMENT: Pain with sleeping on it, not constant, takes tylenol occasionally. Walking with walker-  Also bil knee pain,  L knee brace, and injections.  R hip pain-  gives away , and so does knee  hurst when sleeping on it.     Hand dominance: Right  PERTINENT HISTORY:   PAIN:  Are you having pain? Yes: NPRS scale: ***/10 Pain location: sore, not constant Pain description: R shoulder  Aggravating factors: *** Relieving factors: ***  Are you having pain? Yes: NPRS scale: ***/10 Pain location: R hip Pain description: *** Aggravating factors: *** Relieving factors: ***  Are you having pain? Yes: NPRS scale: ***/10 Pain location: bil knees  Pain description: *** Aggravating factors: *** Relieving factors: ***  PRECAUTIONS: {Therapy precautions:24002}  WEIGHT BEARING RESTRICTIONS: {Yes ***/No:24003}  FALLS:  Has patient fallen in last 6 months? Yes. Number of falls 1 Got up without walker, unsure of reason of fall, about 3 mo ago.   LIVING ENVIRONMENT: Lives with: {OPRC lives  with:25569::"lives with their family"} Lives in: {Lives in:25570} Stairs: {opstairs:27293} Has following equipment at home: Environmental consultant - 4 wheeled    PLOF: {PLOF:24004}  PATIENT GOALS: ***  NEXT MD VISIT: ***  OBJECTIVE:   DIAGNOSTIC FINDINGS:  ***  PATIENT SURVEYS :  {rehab surveys:24030:a}  COGNITION: Overall cognitive status: {cognition:24006}     SENSATION: {sensation:27233}  POSTURE: ***  UPPER EXTREMITY ROM:   R knee: passive 0 to 125,   L knee:  psasive  -5  to 107    Active ROM Right eval Left eval  Shoulder flexion 110/135 115  Shoulder extension    Shoulder abduction    Shoulder adduction    Shoulder internal rotation    Shoulder external rotation    Elbow flexion    Elbow extension    Wrist flexion    Wrist extension    Wrist ulnar deviation    Wrist radial deviation    Wrist pronation    Wrist supination    (Blank rows = not tested)  UPPER EXTREMITY MMT:  MMT Right eval Left eval  Shoulder flexion 4 4  Shoulder extension    Shoulder abduction 4 4  Shoulder adduction    Shoulder internal rotation 4+   Shoulder external rotation 4+   Middle trapezius    Lower trapezius    Elbow flexion    Elbow extension    Wrist flexion    Wrist extension    Wrist ulnar deviation    Wrist radial deviation    Wrist pronation    Wrist supination    Grip strength (lbs)    (Blank rows = not tested)  SHOULDER SPECIAL TESTS: Impingement tests: {shoulder impingement test:25231:a} SLAP lesions: {SLAP lesions:25232} Instability tests: {shoulder instability test:25233} Rotator cuff assessment: {rotator cuff assessment:25234} Biceps assessment: {biceps assessment:25235}  JOINT MOBILITY TESTING:  ***  PALPATION:  ***   TODAY'S TREATMENT:  DATE: ***  PATIENT EDUCATION: Education details: *** Person educated:  {Person educated:25204} Education method: {Education Method:25205} Education comprehension: {Education Comprehension:25206}  HOME EXERCISE PROGRAM: ***  ASSESSMENT:  CLINICAL IMPRESSION: Patient is a *** y.o. *** who was seen today for physical therapy evaluation and treatment for ***.    OBJECTIVE IMPAIRMENTS: {opptimpairments:25111}.   ACTIVITY LIMITATIONS: {activitylimitations:27494}  PARTICIPATION LIMITATIONS: {participationrestrictions:25113}  PERSONAL FACTORS: {Personal factors:25162} are also affecting patient's functional outcome.   REHAB POTENTIAL: {rehabpotential:25112}  CLINICAL DECISION MAKING: {clinical decision making:25114}  EVALUATION COMPLEXITY: {Evaluation complexity:25115}  GOALS: Goals reviewed with patient? {yes/no:20286}  SHORT TERM GOALS: Target date: ***  *** Baseline: Goal status: {GOALSTATUS:25110}  2.  *** Baseline:  Goal status: {GOALSTATUS:25110}  3.  *** Baseline:  Goal status: {GOALSTATUS:25110}  4.  *** Baseline:  Goal status: {GOALSTATUS:25110}  5.  *** Baseline:  Goal status: {GOALSTATUS:25110}  6.  *** Baseline:  Goal status: {GOALSTATUS:25110}  LONG TERM GOALS: Target date: ***  *** Baseline:  Goal status: {GOALSTATUS:25110}  2.  *** Baseline:  Goal status: {GOALSTATUS:25110}  3.  *** Baseline:  Goal status: {GOALSTATUS:25110}  4.  *** Baseline:  Goal status: {GOALSTATUS:25110}  5.  *** Baseline:  Goal status: {GOALSTATUS:25110}  6.  *** Baseline:  Goal status: {GOALSTATUS:25110}  PLAN: PT FREQUENCY: {rehab frequency:25116}  PT DURATION: {rehab duration:25117}  PLANNED INTERVENTIONS: {rehab planned interventions:25118::"Therapeutic exercises","Therapeutic activity","Neuromuscular re-education","Balance training","Gait training","Patient/Family education","Self Care","Joint mobilization"}  PLAN FOR NEXT SESSION: ***   Sedalia Muta, PT 08/29/2022, 1:00 PM

## 2022-08-30 ENCOUNTER — Encounter: Payer: Self-pay | Admitting: Physical Therapy

## 2022-08-31 ENCOUNTER — Telehealth: Payer: Self-pay

## 2022-08-31 NOTE — Telephone Encounter (Signed)
Will call her back in a few, line busy at 2:48 08/31/2022

## 2022-08-31 NOTE — Telephone Encounter (Signed)
Being aggressive with his wife, changes from increase namenda dose, having Hallucinations. Doesn't have any idea and see recommendations for his distress. [phone]. Has two pills left, please advise

## 2022-09-01 ENCOUNTER — Encounter: Payer: Self-pay | Admitting: Podiatry

## 2022-09-01 ENCOUNTER — Ambulatory Visit (INDEPENDENT_AMBULATORY_CARE_PROVIDER_SITE_OTHER): Payer: Medicare HMO | Admitting: Podiatry

## 2022-09-01 DIAGNOSIS — N1831 Chronic kidney disease, stage 3a: Secondary | ICD-10-CM | POA: Diagnosis not present

## 2022-09-01 DIAGNOSIS — Q828 Other specified congenital malformations of skin: Secondary | ICD-10-CM | POA: Diagnosis not present

## 2022-09-01 DIAGNOSIS — M79674 Pain in right toe(s): Secondary | ICD-10-CM | POA: Diagnosis not present

## 2022-09-01 DIAGNOSIS — E1122 Type 2 diabetes mellitus with diabetic chronic kidney disease: Secondary | ICD-10-CM

## 2022-09-01 DIAGNOSIS — M79675 Pain in left toe(s): Secondary | ICD-10-CM

## 2022-09-01 DIAGNOSIS — B351 Tinea unguium: Secondary | ICD-10-CM

## 2022-09-01 NOTE — Progress Notes (Signed)
This patient returns to my office for at risk foot care.  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  This patient is unable to cut nails himself since the patient cannot reach his nails.These nails are painful walking and wearing shoes. . He presents to the office with his wife. This patient presents for at risk foot care today.  General Appearance  Alert, conversant and in no acute stress.  Vascular  Dorsalis pedis and posterior tibial  pulses are palpable  bilaterally.  Capillary return is within normal limits  bilaterally. Temperature is within normal limits  bilaterally.  Neurologic  Senn-Weinstein monofilament wire test within normal limits  bilaterally. Muscle power within normal limits bilaterally.  Nails Thick disfigured discolored nails with subungual debris  from hallux to fifth toes bilaterally. No evidence of bacterial infection or drainage bilaterally.  Orthopedic  No limitations of motion  feet .  No crepitus or effusions noted.  No bony pathology or digital deformities noted.  Skin  normotropic skin with no porokeratosis noted bilaterally.  No signs of infections or ulcers noted.   Callus sub 5th met B/L  symptomatic.  Onychomycosis  Pain in right toes  Pain in toes Callus sub 5th right foot.  Consent was obtained for treatment procedures.   Mechanical debridement of nails 1-5  bilaterally performed with a nail nipper.  Filed with dremel without incident.  Debride porokeratosis with # 15 blade.   Return office visit    3 months                  Told patient to return for periodic foot care and evaluation due to potential at risk complications.   Sayre Witherington DPM   

## 2022-09-01 NOTE — Telephone Encounter (Signed)
Spoke with wife , [name] she is going to hold medication per Jerelyn Charles. Will call with update in a few weeks.

## 2022-09-07 ENCOUNTER — Other Ambulatory Visit: Payer: Self-pay | Admitting: Physician Assistant

## 2022-09-07 ENCOUNTER — Telehealth: Payer: Self-pay | Admitting: Physician Assistant

## 2022-09-07 DIAGNOSIS — F03A0 Unspecified dementia, mild, without behavioral disturbance, psychotic disturbance, mood disturbance, and anxiety: Secondary | ICD-10-CM

## 2022-09-07 MED ORDER — DONEPEZIL HCL 10 MG PO TABS
10.0000 mg | ORAL_TABLET | Freq: Every day | ORAL | 11 refills | Status: DC
Start: 2022-09-07 — End: 2024-01-29

## 2022-09-07 NOTE — Telephone Encounter (Signed)
Pt wife left a message on the VM stating that she would like to speak to Huntley Dec about what her next steps are for the patient

## 2022-09-07 NOTE — Telephone Encounter (Signed)
Patient's wife missed call back . She was calling back to speak with nurse. She is waiting for call back-KB

## 2022-09-07 NOTE — Telephone Encounter (Signed)
Voicemail unable to leave a message at 11:00 09/07/2022

## 2022-09-07 NOTE — Telephone Encounter (Signed)
Wife says he is better with his behavior, would like to go back on Donepezil 10mg  daily now, please advise to call her back today. Thank you

## 2022-09-09 NOTE — Telephone Encounter (Signed)
Patient advised to restart medication.She thanked me for calling

## 2022-09-11 DIAGNOSIS — M25562 Pain in left knee: Secondary | ICD-10-CM | POA: Diagnosis not present

## 2022-09-11 DIAGNOSIS — M1712 Unilateral primary osteoarthritis, left knee: Secondary | ICD-10-CM

## 2022-09-11 DIAGNOSIS — G8929 Other chronic pain: Secondary | ICD-10-CM

## 2022-09-11 MED ORDER — LIDOCAINE HCL 1 % IJ SOLN
2.00 mL | INTRAMUSCULAR | Status: AC | PRN
Start: 2022-09-11 — End: 2022-09-11
  Administered 2022-09-11: 2 mL

## 2022-09-11 MED ORDER — BUPIVACAINE HCL 0.25 % IJ SOLN
2.0000 mL | INTRAMUSCULAR | Status: AC | PRN
Start: 2022-09-11 — End: 2022-09-11
  Administered 2022-09-11: 2 mL via INTRA_ARTICULAR

## 2022-09-11 MED ORDER — METHYLPREDNISOLONE ACETATE 40 MG/ML IJ SUSP
40.0000 mg | INTRAMUSCULAR | Status: AC | PRN
  Administered 2022-09-11: 40 mg via INTRA_ARTICULAR

## 2022-09-12 ENCOUNTER — Ambulatory Visit: Payer: Medicare HMO | Admitting: Sports Medicine

## 2022-09-12 ENCOUNTER — Other Ambulatory Visit: Payer: Self-pay

## 2022-09-12 ENCOUNTER — Encounter: Payer: Self-pay | Admitting: Sports Medicine

## 2022-09-12 DIAGNOSIS — M25562 Pain in left knee: Secondary | ICD-10-CM | POA: Diagnosis not present

## 2022-09-12 DIAGNOSIS — G309 Alzheimer's disease, unspecified: Secondary | ICD-10-CM | POA: Diagnosis not present

## 2022-09-12 DIAGNOSIS — M25511 Pain in right shoulder: Secondary | ICD-10-CM | POA: Diagnosis not present

## 2022-09-12 DIAGNOSIS — G8929 Other chronic pain: Secondary | ICD-10-CM | POA: Diagnosis not present

## 2022-09-12 DIAGNOSIS — M1712 Unilateral primary osteoarthritis, left knee: Secondary | ICD-10-CM

## 2022-09-12 DIAGNOSIS — M25551 Pain in right hip: Secondary | ICD-10-CM

## 2022-09-12 DIAGNOSIS — M1611 Unilateral primary osteoarthritis, right hip: Secondary | ICD-10-CM | POA: Diagnosis not present

## 2022-09-12 DIAGNOSIS — F0280 Dementia in other diseases classified elsewhere without behavioral disturbance: Secondary | ICD-10-CM | POA: Diagnosis not present

## 2022-09-12 MED ORDER — METHYLPREDNISOLONE ACETATE 40 MG/ML IJ SUSP
40.0000 mg | INTRAMUSCULAR | Status: AC | PRN
Start: 2022-09-12 — End: 2022-09-12
  Administered 2022-09-12: 40 mg via INTRA_ARTICULAR

## 2022-09-12 MED ORDER — LIDOCAINE HCL 1 % IJ SOLN
2.0000 mL | INTRAMUSCULAR | Status: AC | PRN
Start: 2022-09-12 — End: 2022-09-12
  Administered 2022-09-12: 2 mL

## 2022-09-12 MED ORDER — BUPIVACAINE HCL 0.25 % IJ SOLN
2.0000 mL | INTRAMUSCULAR | Status: AC | PRN
Start: 2022-09-12 — End: 2022-09-12
  Administered 2022-09-12: 2 mL via INTRA_ARTICULAR

## 2022-09-12 NOTE — Progress Notes (Signed)
Eddie Ferguson. Sipos - 82 y.o. male MRN [id number]  Date of birth: Jun 24, 1940  Office Visit Note: Visit Date: 09/12/2022 PCP: Eddie Majestic, MD Referred by: Eddie Majestic, MD  Subjective: Chief Complaint  Patient presents with   Right Hip - Pain   HPI: Eddie Ferguson. Mehrotra is a pleasant 82 y.o. male who presents today for acute on chronic right hip pain and left knee pain. Presents with his wife who does provide a fair amount of HPI given his dementia.  His spouse, Eddie Ferguson, does state that he has been reporting right hip pain as well as some left knee pain over the last few weeks.  She states he has woken her up on several occasions for the right hip pain stating that it is bothersome.  However when he has had therapy evaluation or with his PCP he says his pain is minimal.  Eddie Ferguson does state that she feels his dementia is certainly affecting his conditions.  Performed US-guided R-hip injection back on 02/23/23 - did well.  Left knee injection in April - helped his pain for a few months but pain returning. Has bone on bone arthritis but not surgical candidate.  Left knee stays chronically swollen.  When asking Eddie Ferguson which hurts the most, he states his left knee.  Type II diabetic, well-controlled. ON metformin 500mg  in past. Lab Results  Component Value Date   HGBA1C 6.9 (H) 05/25/2022   Pertinent ROS were reviewed with the patient and found to be negative unless otherwise specified above in HPI.   Assessment & Plan: Visit Diagnoses:  1. Unilateral primary osteoarthritis, left knee   2. Pain in right hip   3. Arthritis of right hip   4. Chronic pain of left knee   5. Dementia due to Alzheimer's disease (HCC)   6. Right shoulder pain, unspecified chronicity    Plan: Discussed with Eddie Ferguson and Eddie Ferguson the nature of his advanced left knee arthritis and right hip osteoarthritis.  Her left knee is more bothersome, we did proceed with ultrasound guided injection into the left knee.  There is only  trace fluid that was aspirated off the knee.  He tolerated procedure well.  He has been given some physical therapy and home exercises for the hip and knee but this makes his pain worse.  He will hold off on aggravating exercises until we see him back in about 2 weeks for the right hip.  If this is still bothering him, we could proceed with ultrasound-guided intra-articular hip injection.  He does have bone-on-bone change in both the hip and the knee but is not a surgical candidate at this time.  He may use Tylenol or topical cream for any pain control.   I did review his shoulder x-rays which show AC joint arthritis and some mild glenohumeral joint arthritis but certainly not advanced.  Continue HEP for these.   Follow-up: Return in about 10 days (around 09/22/2022) for R-hip evaluation (US-guided inj?).   Meds & Orders: No orders of the defined types were placed in this encounter.   Orders Placed This Encounter  Procedures   Large Joint Inj: L knee   US Guided Needle Placement - No Linked Charges     Procedures: Large Joint Inj: L knee on 09/12/2022 3:23 PM Indications: joint swelling and pain Details: 22 G 1.5 in needle, ultrasound-guided superolateral approach Medications: 2 mL lidocaine 1 %; 2 mL bupivacaine 0.25 %; 40 mg methylPREDNISolone acetate 40 MG/ML Aspirate: clear and yellow;  sent for lab analysis Outcome: tolerated well, no immediate complications Procedure, treatment alternatives, risks and benefits explained, specific risks discussed. Consent was given by the patient. Immediately prior to procedure a time out was called to verify the correct patient, procedure, equipment, support staff and site/side marked as required. Patient was prepped and draped in the usual sterile fashion.          Clinical History: No specialty comments available.  He reports that he quit smoking about 21 years ago. His smoking use included cigarettes. He has never used smokeless tobacco.  Recent  Labs    11/26/21 1557 05/25/22 1048  HGBA1C 7.6* 6.9*  LABURIC  --  5.4    Objective:    Physical Exam  Gen: Well-appearing, in no acute distress; non-toxic CV: Well-perfused. Warm.  Resp: Breathing unlabored on room air; no wheezing. Psych: Fluid speech in conversation; appropriate affect  Ortho Exam - Right hip: No bony TTP.  Patient does walk with a slow shuffling gait with the assistance of a walker.  There is limited internal and external rotation of the right hip.  Positive FADIR test.  - Left knee: There is a small effusion on the knee with notable bony bossing.  Range of motion is limited from 5-110 degrees.  There is significant crepitus through flexion and extension.  Imaging:  Narrative & Impression  CLINICAL DATA:  Pain   EXAM: RIGHT SHOULDER - 2+ VIEW   COMPARISON:  None Available.   FINDINGS: AC joint degenerative change. No fracture or dislocation. No other abnormalities.   IMPRESSION: AC joint degenerative changes.     Electronically Signed   By: Gerome Sam III M.D.   On: 08/25/2022 16:49     XR R-hip 06/28/22: 2 views of the left hip including AP and lateral femoral ordered and  reviewed by myself.  X-rays demonstrate mild to moderate arthritic change  and joint space narrowing of the left hip.  There is a spur of the  acetabular rim and a small drop osteophyte off the inferior femoral head.   There is near severe arthritic change of the right hip joint with  sclerosis.   XR L-knee 4 views 06/28/22: 4 views of the left knee including standing AP, Rosenberg, lateral and  sunrise view were ordered and reviewed by myself.  X-rays demonstrate  severe bone-on-bone tricompartmental collapse and osteoarthritic change.   There is notable sclerosis and spurring at the medial and lateral  tibiofemoral joint.   Past Medical/Family/Surgical/Social History: Medications & Allergies reviewed per EMR, new medications updated. Patient Active Problem  List   Diagnosis Date Noted   Pain due to onychomycosis of toenails of both feet 08/17/2020   Porokeratosis 08/17/2020   Positive FIT (fecal immunochemical test) 06/17/2020   Intermittent diarrhea 06/17/2020   Aortic atherosclerosis (HCC) 01/05/2018   CKD (chronic kidney disease), stage III (HCC) 04/18/2017   Gout 07/09/2015   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 Laverle Patter of [facility] 05/28/2014   Osteoarthritis 05/28/2014   Erectile dysfunction 05/28/2014   Basal cell carcinoma 05/28/2014   Essential hypertension 04/23/2014   Hyperlipidemia 04/23/2014   GERD (gastroesophageal reflux disease) 04/23/2014   Type 2 diabetes mellitus with diabetic chronic kidney disease (HCC) 04/23/2014   Hypothyroidism 04/23/2014   Allergic rhinitis 04/23/2014   Glaucoma 04/23/2014   History of nonmelanoma skin cancer 11/18/2012   History of shingles 04/24/2011   Past Medical  History:  Diagnosis Date   Allergy    Ankle injury 06/01/2001   no surgery- skin debridement    Arthritis    Basal cell carcinoma 05/28/2014   L nasal tip. Mohs Dr. Park Liter.     Blood transfusion    2010 because of stomach ulcers   Cataract    bilaterally removed   Diabetes mellitus    GERD (gastroesophageal reflux disease)    Glaucoma    Hyperlipidemia    Hypertension    Hypothyroidism    Pneumonia 04/12/2006   Prostate cancer (HCC)    Seasonal allergies    Sinusitis    treated for bacterial infection at least once a year at [facility]   Stomach ulcer 2010   bleeding ulcer result NSAIDS   Family History  Problem Relation Age of Onset   Diabetes Mother    Diabetes Father    Hyperlipidemia Father    Colon polyps Father    Brain cancer Brother        smoker   Dementia Paternal Grandmother    Colon cancer Neg Hx    Esophageal cancer Neg Hx    Stomach cancer Neg Hx    Rectal cancer Neg Hx    Past Surgical  History:  Procedure Laterality Date   abdominl abcess     03-15-2011   BACK SURGERY     CATARACT EXTRACTION, BILATERAL     04-07-2014, 03-17-2014   COLONOSCOPY     2012, 5 year repeat   CYSTOURETHROSCOPY  11/11/1998   ESOPHAGOGASTRODUODENOSCOPY  2010   PUD   LUMBAR LAMINECTOMY  2007   [place]   MOHS SURGERY     left side of nose   POLYPECTOMY     PROSTATE BIOPSY     radiation 2016   RECONSTRUCTION TENDON PULLEY W/ TENDON / FASCIAL GRAFT OF HAND / FINGER  1982   rt 3rd finger   RECONSTRUCTION TENDON PULLEY W/ TENDON / FASCIAL GRAFT OF HAND / FINGER  1953   rt 5th finger   UPPER GASTROINTESTINAL ENDOSCOPY     Social History   Occupational History   Occupation: retired  Tobacco Use   Smoking status: Former    Types: Cigarettes    Quit date: 02/03/2001    Years since quitting: 21.6   Smokeless tobacco: Never   Tobacco comments:    intermittent cigar use stopped  Vaping Use   Vaping Use: Never used  Substance and Sexual Activity   Alcohol use: Yes    Alcohol/week: 0.0 standard drinks of alcohol    Comment: occasional beer   Drug use: No   Sexual activity: Not Currently    Partners: Female

## 2022-09-12 NOTE — Progress Notes (Signed)
Here for right hip pain; very bothersome States he started PT, but therapist went on vacation and left him a HEP to do until she returned

## 2022-09-14 ENCOUNTER — Encounter: Payer: Medicare HMO | Admitting: Physical Therapy

## 2022-09-16 ENCOUNTER — Ambulatory Visit: Payer: Medicare HMO | Admitting: Physical Therapy

## 2022-09-16 DIAGNOSIS — R2689 Other abnormalities of gait and mobility: Secondary | ICD-10-CM

## 2022-09-16 DIAGNOSIS — M25562 Pain in left knee: Secondary | ICD-10-CM

## 2022-09-16 DIAGNOSIS — G8929 Other chronic pain: Secondary | ICD-10-CM

## 2022-09-16 DIAGNOSIS — M25551 Pain in right hip: Secondary | ICD-10-CM

## 2022-09-16 DIAGNOSIS — M25511 Pain in right shoulder: Secondary | ICD-10-CM

## 2022-09-16 NOTE — Therapy (Addendum)
 OUTPATIENT PHYSICAL THERAPY UPPER EXTREMITY TREATMENT   Patient Name: Eddie Ferguson. Middlemiss MRN: [id number] DOB:09-24-1940, 82 y.o., male Today's Date: 09/16/2022  END OF SESSION:  PT End of Session - 09/18/22 1635     Visit Number 2    Number of Visits 16    Date for PT Re-Evaluation 10/24/22    Authorization Type [employer]    PT Start Time 1103    PT Stop Time 1144    PT Time Calculation (min) 41 min    Behavior During Therapy Flat affect;WFL for tasks assessed/performed              Past Medical History:  Diagnosis Date   Allergy    Ankle injury 06/01/2001   no surgery- skin debridement    Arthritis    Basal cell carcinoma 05/28/2014   L nasal tip. Mohs Dr. Roel Clarity.     Blood transfusion    2010 because of stomach ulcers   Cataract    bilaterally removed   Diabetes mellitus    GERD (gastroesophageal reflux disease)    Glaucoma    Hyperlipidemia    Hypertension    Hypothyroidism    Pneumonia 04/12/2006   Prostate cancer (HCC)    Seasonal allergies    Sinusitis    treated for bacterial infection at least once a year at [facility]   Stomach ulcer 2010   bleeding ulcer result NSAIDS   Past Surgical History:  Procedure Laterality Date   abdominl abcess     03-15-2011   BACK SURGERY     CATARACT EXTRACTION, BILATERAL     04-07-2014, 03-17-2014   COLONOSCOPY     2012, 5 year repeat   CYSTOURETHROSCOPY  11/11/1998   ESOPHAGOGASTRODUODENOSCOPY  2010   PUD   LUMBAR LAMINECTOMY  2007   [place]    MOHS SURGERY     left side of nose   POLYPECTOMY     PROSTATE BIOPSY     radiation 2016   RECONSTRUCTION TENDON PULLEY W/ TENDON / FASCIAL GRAFT OF HAND / FINGER  1982   rt 3rd finger   RECONSTRUCTION TENDON PULLEY W/ TENDON / FASCIAL GRAFT OF HAND / FINGER  1953   rt 5th finger   UPPER GASTROINTESTINAL ENDOSCOPY     Patient Active Problem List   Diagnosis Date Noted   Pain due to onychomycosis of toenails of both feet 08/17/2020   Porokeratosis 08/17/2020    Positive FIT (fecal immunochemical test) 06/17/2020   Intermittent diarrhea 06/17/2020   Aortic atherosclerosis (HCC) 01/05/2018   CKD (chronic kidney disease), stage III (HCC) 04/18/2017   Gout 07/09/2015   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 Rozanne Corners of [facility] 05/28/2014   Osteoarthritis 05/28/2014   Erectile dysfunction 05/28/2014   Basal cell carcinoma 05/28/2014   Essential hypertension 04/23/2014   Hyperlipidemia 04/23/2014   GERD (gastroesophageal reflux disease) 04/23/2014   Type 2 diabetes mellitus with diabetic chronic kidney disease (HCC) 04/23/2014   Hypothyroidism 04/23/2014   Allergic rhinitis 04/23/2014   Glaucoma 04/23/2014   History of nonmelanoma skin cancer 11/18/2012   History of shingles 04/24/2011    PCP: Clarisa Crooked  REFERRING PROVIDER: Clarisa Crooked  REFERRING DIAG: R shoulder pain, OA bil knees, Weakness in Bil LEs   THERAPY DIAG:  Acute pain of right shoulder  Chronic pain of left knee  Pain in right hip  Other abnormalities of gait and mobility  Rationale for Evaluation and Treatment: Rehabilitation  ONSET DATE:    SUBJECTIVE:                                                                                                                                                                                      SUBJECTIVE STATEMENT: No new complaints. Wife reports he had L knee drained and had injection.  Going to have injection in R hip soon.  Has not been able to do HEP due to alzheimer's.  Eval: Pts wife present with pt today, helps with history. States patient has dementia and does not always answer questions. Pt and wife state pain in R shoulder, notes Pain with sleeping on it, not constant, takes tylenol occasionally.   Also bil knee pain,  L knee brace, and is getting injections. Feels that L leg gives way on him at times.  R hip  pain-  also gives away , and hurst when sleeping on it.  He is using rollator walker at all times, holding with outstretched arms quite far away from his body.   Hand dominance: Right  PERTINENT HISTORY:dementia/alzheimers,  HTN, Prostate CA, dm,    PAIN:  Are you having pain? Yes: NPRS scale: unable to state/10 Pain location: sore, not constant Pain description: R shoulder  Aggravating factors: sleeping, use  Relieving factors: none stated   Are you having pain? Yes: NPRS scale: unable to state /10 Pain location: R hip Pain description: sore Aggravating factors: sleeping, getting up and down  Relieving factors: none stated   Are you having pain? Yes: NPRS scale: unable to state /10 Pain location: bil knees L>R Pain description: sore, weak Aggravating factors: walking, getting up and down, stairs.  Relieving factors: none stated   PRECAUTIONS: Fall  WEIGHT BEARING RESTRICTIONS: No  FALLS:  Has patient fallen in last 6 months? Yes. Number of falls 1 Got up without walker, unsure of reason of fall, about 3 mo ago.   LIVING ENVIRONMENT: Lives with: lives with their family and lives with their spouse Lives in: House/apartment- condo Stairs: No Has following equipment at home: Environmental consultant - 4 wheeled    PLOF: Independent with household mobility with device  PATIENT GOALS: decreased pain   NEXT MD VISIT:   OBJECTIVE:   DIAGNOSTIC FINDINGS:    PATIENT SURVEYS :    COGNITION: Overall cognitive status: History of cognitive impairments - at baseline     SENSATION:  POSTURE: Significant rounded shoulders and fwd head  Gait:  using rollator, outstretched arms and fwd flexed trunk, walker quite far out in front of him.     ROM:   R knee: passive 0 to  125,   L knee:  psasive  -5  to 107    Active ROM Right eval Left eval  Shoulder flexion 110/135 115  Shoulder extension    Shoulder abduction    Shoulder adduction    Shoulder internal rotation    Shoulder  external rotation    Elbow flexion    Elbow extension    Wrist flexion    Wrist extension    Wrist ulnar deviation    Wrist radial deviation    Wrist pronation    Wrist supination    (Blank rows = not tested)   MMT:  MMT Right eval Left eval  Shoulder flexion 4 4  Shoulder extension    Shoulder abduction 4 4  Shoulder adduction    Shoulder internal rotation 4+   Shoulder external rotation 4+   Middle trapezius    Lower trapezius    Elbow flexion    Elbow extension    Wrist flexion    Wrist extension    Wrist ulnar deviation    Wrist radial deviation    Wrist pronation    Wrist supination    Grip strength (lbs)    (Blank rows = not tested)   LE:  Hips: 4-/5  Knee:  R: 4+/5,   L: ext 4/5, flex 4+/5     JOINT MOBILITY TESTING:  Mild hypomobility for R GHJ,  hypomobile R hip- stiff with flexion and ER/IR, hypomobile L knee for flex and ext;   PALPATION:  Pain at R [facility] joint region.  Soreness in R gr troch, minimal pain to palpate knee    TODAY'S TREATMENT:                                                                                                                                         DATE:   09/16/22: Therapeutic Exercise: Aerobic:  Supine:  Seated:  LAQ  3lb x 20 bil;   Pulley for shoulder flexion 2 x 10; shoulder ER YTB2 x 10;  Standing: Stretches:  Neuromuscular Re-education:  Education and practice for sit to stand mechanics, x 8 at mat table; Cueing with ambulation 45 ft x 2 , for keeping walker closer to body, cueing with direction changes, for keeping feet inside walker.   Manual Therapy: Therapeutic Activity:  Self Care:    08/29/22:  ther ex: see below for HEP Gait: cuing for upright posture and keeping small bend in elbows at side.  45 ft  x2;   PATIENT EDUCATION:  Education details: PT POC, Exam findings, HEP Person educated: Patient Education method: Explanation, Demonstration, Tactile cues, Verbal cues, and Handouts Education  comprehension: verbalized understanding, returned demonstration, verbal cues required, tactile cues required, and needs further education   HOME EXERCISE PROGRAM: Access Code: [email or web address] URL: [email or web address] Date: 09/16/2022 Prepared by: Terrilee Few  Exercises - Bent Knee Fallouts  - 1 x daily - 1  sets - 10 reps - 5 hold - Supine Quadricep Sets  - 1 x daily - 1-2 sets - 10 reps - 5 hold - Supine Shoulder Flexion Extension AAROM with Dowel  - 1 x daily - 1 sets - 10 reps - 5 hold - Seated Knee Extension AROM  - 1 x daily - 2 sets - 10 reps - Seated Bilateral Shoulder External Rotation with Resistance  - 1 x daily - 1-2 sets - 10 reps - Seated Shoulder Flexion AAROM with Pulley Behind  - 1 x daily - 2 sets - 10 repsd  ASSESSMENT:  CLINICAL IMPRESSION: Updated HEP, gave new pictures, wife will try to leave near pts seat at couch in attempts for increased compliance. He has tendency for using back of knees on table with sit to stands, practice today for increased fwd weight shift with transfers. Wife reports he is getting up easier at home with addition of risers under couch legs.  Attempts to have pt ambulate short distance without walker (for improving upright posture ) but he is very fearful. Pt to benefit from continued care.   Eval: Patient presents with primary complaint of increased pain in R shoulder R hip, and L knee. He has stiffness , weakness and pain in each. He has decreased ability for functional activities, transfers, stairs, and ambulation due to pain and deficit. He is keeping walker too far away from body with gait, which may be likely contributing to shoulder pain as well. Education with pts wife will be helpful for safety and HEP.    OBJECTIVE IMPAIRMENTS: Abnormal gait, decreased activity tolerance, decreased knowledge of use of DME, decreased mobility, difficulty walking, decreased ROM, decreased strength, decreased safety awareness, hypomobility,  increased muscle spasms, impaired flexibility, impaired UE functional use, improper body mechanics, and pain.   ACTIVITY LIMITATIONS: carrying, lifting, standing, squatting, sleeping, stairs, transfers, reach over head, hygiene/grooming, and locomotion level  PARTICIPATION LIMITATIONS: cleaning and community activity  PERSONAL FACTORS: Time since onset of injury/illness/exacerbation and 1-2 comorbidities: dementia, multiple pain areas,   are also affecting patient's functional outcome.   REHAB POTENTIAL: Fair    CLINICAL DECISION MAKING: Evolving/moderate complexity  EVALUATION COMPLEXITY: Moderate  GOALS: Goals reviewed with patient? Yes  SHORT TERM GOALS: Target date: 09/19/2022    Pt to be independent with initial HEP with assist from his wife.   Goal status: INITIAL  2.  Pt to demo optimal mechanics and ability for sit to stand from mat table.   Goal status: INITIAL   LONG TERM GOALS: Target date: 10/24/2022  Pt to be independent with final HEP with assist from his wife.   Goal status: INITIAL  2.  Pt to demo ability for walking with walker at safe distance away from body, for at least 50 % of the time.   Goal status: INITIAL  3.  Pt to demo ability for safe/independent sit to stand and stair navigation with 1-2  hand rails.    Goal status: INITIAL  4.  Pt to demo ability for functional reach, lift with R shoulder elevation without pain , to improve ability for ADLS. And IADLs.    Goal status: INITIAL   PLAN: PT FREQUENCY: 1-2x/week  PT DURATION: 8 weeks  PLANNED INTERVENTIONS: Therapeutic exercises, Therapeutic activity, Neuromuscular re-education, Patient/Family education, Self Care, Joint mobilization, Joint manipulation, Stair training, Orthotic/Fit training, DME instructions, Aquatic Therapy, Dry Needling, Electrical stimulation, Cryotherapy, Moist heat, Taping, Ultrasound, Ionotophoresis 4mg /ml Dexamethasone, Manual therapy,  Vasopneumatic device,  Traction, Spinal  manipulation, Spinal mobilization,Balance training, Gait training,   PLAN FOR NEXT SESSION:    Terrilee Few, PT, DPT 4:39 PM  09/18/22    PHYSICAL THERAPY DISCHARGE SUMMARY  Visits from Start of Care: 2  Plan: Patient agrees to discharge.  Patient goals were not met. Patient is being discharged due to not returning after visit 2     Terrilee Few, PT, DPT 9:57 AM  07/06/23

## 2022-09-18 ENCOUNTER — Encounter: Payer: Self-pay | Admitting: Physical Therapy

## 2022-09-20 ENCOUNTER — Encounter: Payer: Medicare HMO | Admitting: Physical Therapy

## 2022-09-22 ENCOUNTER — Encounter: Payer: Medicare HMO | Admitting: Physical Therapy

## 2022-09-26 ENCOUNTER — Encounter: Payer: Self-pay | Admitting: Sports Medicine

## 2022-09-26 ENCOUNTER — Ambulatory Visit: Payer: Medicare HMO | Admitting: Sports Medicine

## 2022-09-26 ENCOUNTER — Ambulatory Visit: Payer: Self-pay

## 2022-09-26 ENCOUNTER — Encounter: Payer: Self-pay | Admitting: Family Medicine

## 2022-09-26 ENCOUNTER — Encounter: Payer: Medicare HMO | Admitting: Physical Therapy

## 2022-09-26 ENCOUNTER — Ambulatory Visit (INDEPENDENT_AMBULATORY_CARE_PROVIDER_SITE_OTHER): Payer: Medicare HMO | Admitting: Family Medicine

## 2022-09-26 VITALS — BP 132/68 | HR 66 | Temp 98.6°F | Ht 68.0 in | Wt 197.8 lb

## 2022-09-26 DIAGNOSIS — E1122 Type 2 diabetes mellitus with diabetic chronic kidney disease: Secondary | ICD-10-CM | POA: Diagnosis not present

## 2022-09-26 DIAGNOSIS — E785 Hyperlipidemia, unspecified: Secondary | ICD-10-CM | POA: Diagnosis not present

## 2022-09-26 DIAGNOSIS — E039 Hypothyroidism, unspecified: Secondary | ICD-10-CM | POA: Diagnosis not present

## 2022-09-26 DIAGNOSIS — N183 Chronic kidney disease, stage 3 unspecified: Secondary | ICD-10-CM

## 2022-09-26 DIAGNOSIS — I10 Essential (primary) hypertension: Secondary | ICD-10-CM | POA: Diagnosis not present

## 2022-09-26 DIAGNOSIS — N1831 Chronic kidney disease, stage 3a: Secondary | ICD-10-CM

## 2022-09-26 DIAGNOSIS — Z0000 Encounter for general adult medical examination without abnormal findings: Secondary | ICD-10-CM | POA: Diagnosis not present

## 2022-09-26 DIAGNOSIS — Z79899 Other long term (current) drug therapy: Secondary | ICD-10-CM | POA: Diagnosis not present

## 2022-09-26 DIAGNOSIS — M1611 Unilateral primary osteoarthritis, right hip: Secondary | ICD-10-CM

## 2022-09-26 DIAGNOSIS — G309 Alzheimer's disease, unspecified: Secondary | ICD-10-CM

## 2022-09-26 DIAGNOSIS — M25551 Pain in right hip: Secondary | ICD-10-CM

## 2022-09-26 DIAGNOSIS — F0280 Dementia in other diseases classified elsewhere without behavioral disturbance: Secondary | ICD-10-CM

## 2022-09-26 MED ORDER — METHYLPREDNISOLONE ACETATE 40 MG/ML IJ SUSP
40.0000 mg | INTRAMUSCULAR | Status: AC | PRN
Start: 2022-09-26 — End: 2022-09-26
  Administered 2022-09-26: 40 mg via INTRA_ARTICULAR

## 2022-09-26 NOTE — Progress Notes (Signed)
Phone: [phone]   Subjective:  Patient presents today for their annual physical. Chief complaint-noted.   See problem oriented charting- ROS- full  review of systems was completed and negative  except for: appetite continues to lower, weight loss with decreased appetite, lightheaded at times, weakness, more stubborn than usual, confusion, decreased concentration, prior hallucinations doing better  The following were reviewed and entered/updated in epic: Past Medical History:  Diagnosis Date   Allergy    Ankle injury 06/01/2001   no surgery- skin debridement    Arthritis    Basal cell carcinoma 05/28/2014   L nasal tip. Mohs Dr. Park Liter.     Blood transfusion    2010 because of stomach ulcers   Cataract    bilaterally removed   Diabetes mellitus    GERD (gastroesophageal reflux disease)    Glaucoma    Hyperlipidemia    Hypertension    Hypothyroidism    Pneumonia 04/12/2006   Prostate cancer (HCC)    Seasonal allergies    Sinusitis    treated for bacterial infection at least once a year at [facility]   Stomach ulcer 2010   bleeding ulcer result NSAIDS   Patient Active Problem List   Diagnosis Date Noted   Dementia due to Alzheimer's disease (HCC) 06/16/2015    Priority: High   History of prostate cancer-follows with Dr. [facility]verle Patter of [facility] 05/28/2014    Priority: High   Type 2 diabetes mellitus with diabetic chronic kidney disease (HCC) 04/23/2014    Priority: High   Aortic atherosclerosis (HCC) 01/05/2018    Priority: Medium    CKD (chronic kidney disease), stage III (HCC) 04/18/2017    Priority: Medium    Gout 07/09/2015    Priority: Medium    Essential hypertension 04/23/2014    Priority: Medium    Hyperlipidemia 04/23/2014    Priority: Medium    Hypothyroidism 04/23/2014    Priority: Medium    Adenomatous colon polyp 05/28/2014    Priority: Low   Osteoarthritis 05/28/2014    Priority: Low   Erectile dysfunction 05/28/2014    Priority: Low    Basal cell carcinoma 05/28/2014    Priority: Low   GERD (gastroesophageal reflux disease) 04/23/2014    Priority: Low   Allergic rhinitis 04/23/2014    Priority: Low   Glaucoma 04/23/2014    Priority: Low   History of nonmelanoma skin cancer 11/18/2012    Priority: Low   Pain due to onychomycosis of toenails of both feet 08/17/2020   Porokeratosis 08/17/2020   Positive FIT (fecal immunochemical test) 06/17/2020   Intermittent diarrhea 06/17/2020   History of shingles 04/24/2011   Past Surgical History:  Procedure Laterality Date   abdominl abcess     03-15-2011   BACK SURGERY     CATARACT EXTRACTION, BILATERAL     04-07-2014, 03-17-2014   COLONOSCOPY     2012, 5 year repeat   CYSTOURETHROSCOPY  11/11/1998   ESOPHAGOGASTRODUODENOSCOPY  2010   PUD   LUM[place]OMY  [place]   [place]   MOHS SURGERY     left side of nose   POLYPECTOMY     PROSTATE BIOPSY     radiation 2016   RECONSTRUCTION TENDON PULLEY W/ TENDON / FASCIAL GRAFT OF HAND / FINGER  1982   rt 3rd finger   RECONSTRUCTION TENDON PULLEY W/ TENDON / FASCIAL GRAFT OF HAND / FINGER  1953   rt 5th finger   UPPER GASTROINTESTINAL ENDOSCOPY      Family History  Problem Relation Age of Onset   Diabetes Mother    Diabetes Father    Hyperlipidemia Father    Colon polyps Father    Brain cancer Brother        smoker   Dementia Paternal Grandmother    Colon cancer Neg Hx    Esophageal cancer Neg Hx    Stomach cancer Neg Hx    Rectal cancer Neg Hx     Medications- reviewed and updated Current Outpatient Medications  Medication Sig Dispense Refill   acetaminophen (TYLENOL) 500 MG tablet Take 500 mg by mouth every 6 (six) hours as needed. Reported on 06/16/2015     allopurinol (ZYLOPRIM) 100 MG tablet TAKE 1 TABLET BY MOUTH TWICE A DAY 180 tablet 3   amLODipine (NORVASC) 5 MG tablet TAKE 1 TABLET BY MOUTH EVERY DAY 90 tablet 1   benazepril (LOTENSIN) 40 MG tablet TAKE 1 TABLET BY MOUTH EVERY DAY 90 tablet 3    carvedilol (COREG) 25 MG tablet TAKE 1/2 TABLETS BY MOUTH 2 TIMES DAILY WITH A MEAL. 90 tablet 3   chlorthalidone (HYGROTON) 25 MG tablet TAKE 1/2 TO 1 TABLET BY MOUTH DAILY 90 tablet 1   donepezil (ARICEPT) 10 MG tablet Take 1 tablet (10 mg total) by mouth daily. 30 tablet 11   famotidine (PEPCID) 20 MG tablet Take 20 mg by mouth daily.     fexofenadine (ALLEGRA) 180 MG tablet Take 180 mg by mouth.     fluticasone (FLONASE) 50 MCG/ACT nasal spray Place 2 sprays into both nostrils daily. 48 g 3   hydrocortisone 2.5 % ointment Apply topically 2 (two) times daily. For 7-10 days for hemorrhoid tags- Can reuse in future but need at least 1 week break 30 g 2   KLOR-CON M20 20 MEQ tablet TAKE 2 TABLETS BY MOUTH 2 TIMES DAILY. 360 tablet 3   levothyroxine (SYNTHROID) 50 MCG tablet TAKE 1 TABLET BY MOUTH EVERY DAY 90 tablet 3   memantine (NAMENDA) 10 MG tablet Take 1 tablet twice a day 180 tablet 3   metFORMIN (GLUCOPHAGE) 500 MG tablet Take 1 tablet (500 mg total) by mouth daily with breakfast. 90 tablet 3   methocarbamol (ROBAXIN) 750 MG tablet Take 750 mg by mouth 4 (four) times daily.     Multiple Vitamin (MULTIVITAMIN) tablet Take 1 tablet by mouth daily.     pravastatin (PRAVACHOL) 40 MG tablet TAKE 1 TABLET BY MOUTH EVERY DAY 90 tablet 3   No current facility-administered medications for this visit.    Allergies-reviewed and updated Allergies  Allergen Reactions   Aspirin Other (See Comments)    Stomach ulcers   Nsaids Other (See Comments)    Stomach ulcers   Amoxicillin-Pot Clavulanate Rash   Sulfa Antibiotics Swelling and Rash    Social History   Social History Narrative   Married (wife patient outside Financial risk analyst). 2 children. 5 grandkids.       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       Hobbies: travel, enjoys going to shows, [facility] women's basketball tournament. Doesn't watch tv.             Lives in 1 story condo on the 3rd floor   Bachelor's degree   Right handed    Objective  Objective:  BP 132/68   Pulse 66   Temp 98.6 F (37 C)   Ht 5\' 8"  (1.727 m)   Wt 197 lb 12.8 oz (89.7 kg)   SpO2 95%   BMI 30.08  kg/m  Gen: NAD, resting comfortably HEENT: Mucous membranes are moist. Oropharynx normal Neck: no thyromegaly CV: RRR no murmurs rubs or gallops Lungs: CTAB no crackles, wheeze, rhonchi Abdomen: soft/nontender/nondistended/normal bowel sounds. No rebound or guarding.  Ext: no edema Skin: warm, dry Neuro:  moves all extremities, PERRLA, walks with walker, looks to wife for answers   Assessment and Plan  82 y.o. male presenting for annual physical.  Health Maintenance counseling: 1. Anticipatory guidance: Patient counseled regarding regular dental exams -q6 months, eye exams - q6 months,  avoiding smoking and second hand smoke , limiting alcohol to 2 beverages per day - not drinking other than rare social beer, no illicit drugs .   2. Risk factor reduction:  Advised patient of need for regular exercise and diet rich and fruits and vegetables to reduce risk of heart attack and stroke.  Exercise- very limited- encouraged to increase if safe but hard with joint issues.  Diet/weight management-down 18 lbs from last years physical in march- appetite is down- consider glucerna between meals.  Wt Readings from Last 3 Encounters:  09/26/22 197 lb 12.8 oz (89.7 kg)  08/18/22 202 lb 6.4 oz (91.8 kg)  06/21/22 211 lb (95.7 kg)  3. Immunizations/screenings/ancillary studies- up to date- consider fall flu and COVID shot Immunization History  Administered Date(s) Administered   Fluad Quad(high Dose 65+) 01/07/2019, 01/08/2020, 12/28/2021   Hepatitis B 06/19/2007, 11/16/2007, 04/18/2008   Hepatitis B, ADULT 06/19/2007, 11/16/2007, 04/18/2008   Hepatitis B, PED/ADOLESCENT 06/19/2007, 11/16/2007, 04/18/2008   Influenza Whole 01/27/2012   Influenza, High Dose Seasonal PF 01/27/2012, 12/21/2015, 12/20/2016, 12/01/2017   Influenza,inj,Quad PF,6+ Mos  02/24/2015   Influenza-Unspecified 01/27/2012, 01/21/2014, 12/31/2020   Meningococcal polysaccharide vaccine (MPSV4) 12/19/2008   PFIZER Comirnaty(Gray Top)Covid-19 Tri-Sucrose Vaccine 06/16/2020, 02/16/2022   PFIZER(Purple Top)SARS-COV-2 Vaccination 03/27/2019, 04/17/2019, 12/10/2019   Pfizer Covid-19 Vaccine Bivalent Booster 51yrs & up 12/01/2020   Pneumococcal Conjugate-13 08/25/2014   Pneumococcal Polysaccharide-23 06/19/2007, 12/19/2008, 12/21/2015   Respiratory Syncytial Virus Vaccine,Recomb Aduvanted(Arexvy) 02/23/2022   Td 04/23/2014   Tdap 03/07/2001   Zoster Recombinant(Shingrix) 05/19/2020, 07/21/2020   Zoster, Live 08/12/2008   4. Prostate cancer screening-  history prostate cancer- follow up with Dr. Laverle Patter [facility]. PSA checks wht them. Prior radiation   5. Colon cancer screening - Dr. Rhea Belton released after 04/13/16  we referred back later due to fecal occult blood testing (test for traces of blood in bowel movments)) positive- gastroenterology recommended no further testing 6. Skin cancer screening- sees Dr. Doreen Beam. advised regular sunscreen use. Denies worrisome, changing, or new skin lesions.  7. Smoking associated screening (lung cancer screening, AAA screen 65-75, UA)- former smoker- occasional cigars in past but over 11 years without cigars. Gets UA ant urolgoy 8. STD screening - only active with wife  Status of chronic or acute concerns   #Musculoskeletal - working with physical therapy was not very helpful (Dr. Shon Baton mentioned home health physical therapy)- had 2 sessions. Working with orthopedic and had shot left knee and right hip recently. Hip has been very bothersome - right shoulder pain has been better thankfully -refusing to use wheelchair and wife needs him to use one for some support -they may try higher walker- looks like about $200 on site they found  # Dementia- sees Dr. Karel Jarvis and Marlowe Kays S:medication:  aricept 10 mg and namenda 10 mg twice  daily -reducing dose was helpful for hallucinations A/P: dementia noted- continue close follow up with neurology  -[facility]- I  think that would help his wife tremendously  # Diabetes S: Medication:metformin 500mg  started 11/29/21- fatigue in past on this  Lab Results  Component Value Date   HGBA1C 6.9 (H) 05/25/2022   HGBA1C 7.6 (H) 11/26/2021   HGBA1C 6.7 (A) 06/01/2021   A/P: hopefully stable- update diabets today. Continue current meds for now   #hypertension #CKD III- GFR typically 1.2-1.4 and most recently  S: medication: Benazepril 40 mg, chlorthalidone 25 mg, amlodipine 5 mg, carvedilol 25 mg twice daily.  Does need potassium with chlorthalidone  Home readings #s: occasional checks BP Readings from Last 3 Encounters:  09/26/22 132/68  08/18/22 110/60  06/21/22 (!) 121/59  A/P: stable- continue current medicines   #hyperlipidemia with aortic atherosclerosis S: Medication:Pravastatin 40 mg Lab Results  Component Value Date   CHOL 133 05/25/2022   HDL 57.80 05/25/2022   LDLCALC 60 05/25/2022   LDLDIRECT 90.0 09/28/2016   TRIG 74.0 05/25/2022   CHOLHDL 2 05/25/2022  A/P: stable- continue current medicines - Low Density Lipoprotein (LDL cholesterol) at goal under 70 even for aortic atherosclerosis (presumed stable)- LDL goal ideally <70   #hypothyroidism S: compliant On thyroid medication-levothyroxine 50 mcg  Lab Results  Component Value Date   TSH 2.92 05/25/2022  A/P:stable- continue current medicines    #Gout S: 0  flares  on allopurinol 100 mg despite uric acid being slightly above goal Lab Results  Component Value Date   LABURIC 5.4 05/25/2022  A/P:stable- continue current medicines - declines need for repeat- has to be careful with shellfish   Recommended follow up: Return in about 6 months (around 03/29/2023) for followup or sooner if needed.Schedule b4 you leave. Future Appointments  Date Time Provider Department Center  09/28/2022  11:00 AM Sedalia Muta, PT [facility] None  10/04/2022 11:00 AM Sedalia Muta, PT [facility] None  10/06/2022 11:00 AM Sedalia Muta, PT [facility] None  12/02/2022 12:30 PM Helane Gunther, DPM [facility]  12/21/2022 11:30 AM Gwynneth Munson, Sung Amabile, PA-C [facility] None  03/21/2023 11:15 AM [facility] ANNUAL WELLNESS VISIT 1 [facility]   Lab/Order associations:NOT fasting   ICD-10-CM   1. Preventative health care  Z00.00     2. High risk medication use  Z79.899     3. Type 2 diabetes mellitus with stage 3a chronic kidney disease, without long-term current use of insulin (HCC)  E11.22    N18.31     4. Dementia due to Alzheimer's disease (HCC)  G30.9    F02.80     5. Hypothyroidism, unspecified type  E03.9     6. Hyperlipidemia, unspecified hyperlipidemia type  E78.5     7. Essential hypertension  I10     8. Stage 3 chronic kidney disease, unspecified whether stage 3a or 3b CKD (HCC)  N18.30       No orders of the defined types were placed in this encounter.   Return precautions advised.  Tana Conch, MD

## 2022-09-26 NOTE — Patient Instructions (Addendum)
Please stop by lab before you go If you have mychart- we will send your results within 3 business days of [place] receiving them.  If you do not have mychart- we will call you about results within 5 business days of [place] receiving them.  *please also note that you will see labs on mychart as soon as they post. I will later go in and write notes on them- will say "notes from Dr. Durene Cal"   Recommended follow up: Return in about 6 months (around 03/29/2023) for followup or sooner if needed.Schedule b4 you leave.

## 2022-09-26 NOTE — Progress Notes (Addendum)
Office Visit Note   Patient: Eddie Ferguson           Date of Birth: [birth year]           MRN: [id number] Visit Date: 09/26/2022              Requested by: Eddie Majestic, MD [address] [place],  [place] [address] PCP: Eddie Majestic, MD   Assessment & Plan: Visit Diagnoses:  1. Pain in right hip     Plan: Patient does have severe osteoarthritis of the right hip which is contributing to his pain as well as difficulty with ambulation. Ultrasound guided hip injection of the right hip was done.  Patient tolerated the procedure well.  Discussed with patient's wife that patient may benefit from home health PT and would recommend primary care doctor to order this.  Patient would also do well with a high walker. Recommend patient to follow-up in few weeks if no improvement.  Follow-Up Instructions: No follow-ups on file.   Orders:  Orders Placed This Encounter  Procedures   US Guided Needle Placement - No Linked Charges   No orders of the defined types were placed in this encounter.     Procedures: Large Joint Inj: R hip joint on 09/26/2022 10:03 AM Indications: pain Details: 22 G 3.5 in needle, ultrasound-guided anterior approach Medications: 40 mg methylPREDNISolone acetate 40 MG/ML Outcome: tolerated well, no immediate complications  Procedure: US-guided intra-articular hip injection, Right After discussion on risks/benefits/indications and informed verbal consent was obtained, a timeout was performed. Patient was lying supine on exam table. The hip was cleaned with betadine and alcohol swabs. Then utilizing ultrasound guidance, the patient's femoral head and neck junction was identified and subsequently injected with 4:1 lidocaine:depomedrol via an in-plane approach with ultrasound visualization of the injectate administered into the hip joint. Patient tolerated procedure well without immediate complications.  Procedure, treatment alternatives, risks and benefits  explained, specific risks discussed. Consent was given by the patient and spouse. Immediately prior to procedure a time out was called to verify the correct patient, procedure, equipment, support staff and site/side marked as required. Patient was prepped and draped in the usual sterile fashion.      Clinical Data: No additional findings.   Subjective: Chief Complaint  Patient presents with   Right Hip - Pain    Patient is presenting today for right hip pain.  Patient is accompanied by his spouse Eddie Ferguson.  Eddie Ferguson states that she has been having difficult time with Eddie Ferguson because he is having a difficult time getting around due to the hip pain. Patient sometimes feels like his hip "gives out".  Patient this morning had difficulty getting to the car and had to use a walker.  Patient used a wheelchair to get into the clinic appointment today.  Patient continues to have right-sided hip pain.  Patient had some mild relief from steroid injection of the knee 2 weeks ago.  Patient does have some baseline dementia.  Patient's wife is concerned as she is 62 years old and is not sure if she would be able to continue managing patient's day-to-day activities.  Patient otherwise has no other complaints at this time.   Review of Systems   Objective: Vital Signs: There were no vitals taken for this visit.  Hip, Right: Right hip exam limited due to pain. Passive Log Roll shows mild R sided internal and external restriction. Antalgic gait noted, patient using wheel chair.   Specialty Comments:  No specialty comments available.  Imaging:  - B/l hip XR 06/28/22: 2 views of the left hip including AP and lateral femoral ordered and  reviewed by myself.  X-rays demonstrate mild to moderate arthritic change  and joint space narrowing of the left hip.  There is a spur of the  acetabular rim and a small drop osteophyte off the inferior femoral head.   There is near severe arthritic change of the right hip joint  with  sclerosis.    PMFS History: Patient Active Problem List   Diagnosis Date Noted   Pain due to onychomycosis of toenails of both feet 08/17/2020   Porokeratosis 08/17/2020   Positive FIT (fecal immunochemical test) 06/17/2020   Intermittent diarrhea 06/17/2020   Aortic atherosclerosis (HCC) 01/05/2018   CKD (chronic kidney disease), stage III (HCC) 04/18/2017   Gout 07/09/2015   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 Laverle Ferguson of [facility] 05/28/2014   Osteoarthritis 05/28/2014   Erectile dysfunction 05/28/2014   Basal cell carcinoma 05/28/2014   Essential hypertension 04/23/2014   Hyperlipidemia 04/23/2014   GERD (gastroesophageal reflux disease) 04/23/2014   Type 2 diabetes mellitus with diabetic chronic kidney disease (HCC) 04/23/2014   Hypothyroidism 04/23/2014   Allergic rhinitis 04/23/2014   Glaucoma 04/23/2014   History of nonmelanoma skin cancer 11/18/2012   History of shingles 04/24/2011   Past Medical History:  Diagnosis Date   Allergy    Ankle injury 06/01/2001   no surgery- skin debridement    Arthritis    Basal cell carcinoma 05/28/2014   L nasal tip. Mohs Dr. Park Ferguson.     Blood transfusion    2010 because of stomach ulcers   Cataract    bilaterally removed   Diabetes mellitus    GERD (gastroesophageal reflux disease)    Glaucoma    Hyperlipidemia    Hypertension    Hypothyroidism    Pneumonia 04/12/2006   Prostate cancer (HCC)    Seasonal allergies    Sinusitis    treated for bacterial infection at least once a year at [facility]   Stomach ulcer 2010   bleeding ulcer result NSAIDS    Family History  Problem Relation Age of Onset   Diabetes Mother    Diabetes Father    Hyperlipidemia Father    Colon polyps Father    Brain cancer Brother        smoker   Dementia Paternal Grandmother    Colon cancer Neg Hx    Esophageal cancer Neg Hx     Stomach cancer Neg Hx    Rectal cancer Neg Hx     Past Surgical History:  Procedure Laterality Date   abdominl abcess     03-15-2011   BACK SURGERY     CATARACT EXTRACTION, BILATERAL     04-07-2014, 03-17-2014   COLONOSCOPY     2012, 5 year repeat   CYSTOURETHROSCOPY  11/11/1998   ESOPHAGOGASTRODUODENOSCOPY  2010   PUD   LUMBAR LAMINECTOMY  2007   [place]   MOHS SURGERY     left side of nose   POLYPECTOMY     PROSTATE BIOPSY     radiation 2016   RECONSTRUCTION TENDON PULLEY W/ TENDON / FASCIAL GRAFT OF HAND / FINGER  1982   rt 3rd finger   RECONSTRUCTION TENDON PULLEY W/ TENDON / FASCIAL GRAFT OF HAND / FINGER  1953   rt 5th finger  UPPER GASTROINTESTINAL ENDOSCOPY     Social History   Occupational History   Occupation: retired  Tobacco Use   Smoking status: Former    Current packs/day: 0.00    Types: Cigarettes    Quit date: 02/03/2001    Years since quitting: 21.6   Smokeless tobacco: Never   Tobacco comments:    intermittent cigar use stopped  Vaping Use   Vaping status: Never Used  Substance and Sexual Activity   Alcohol use: Yes    Alcohol/week: 0.0 standard drinks of alcohol    Comment: occasional beer   Drug use: No   Sexual activity: Not Currently    Partners: Female

## 2022-09-27 LAB — COMPREHENSIVE METABOLIC PANEL
ALT: 13 U/L (ref 0–53)
AST: 13 U/L (ref 0–37)
Albumin: 3.8 g/dL (ref 3.5–5.2)
Alkaline Phosphatase: 72 U/L (ref 39–117)
BUN: 16 mg/dL (ref 6–23)
CO2: 30 mEq/L (ref 19–32)
Calcium: 9.7 mg/dL (ref 8.4–10.5)
Chloride: 97 mEq/L (ref 96–112)
Creatinine, Ser: 1.33 mg/dL (ref 0.40–1.50)
GFR: 49.14 mL/min — ABNORMAL LOW (ref >60.00)
Glucose, Bld: 321 mg/dL — ABNORMAL HIGH (ref 70–99)
Potassium: 4.2 mEq/L (ref 3.5–5.1)
Sodium: 137 mEq/L (ref 135–145)
Total Bilirubin: 0.6 mg/dL (ref 0.2–1.2)
Total Protein: 6.1 g/dL (ref 6.0–8.3)

## 2022-09-27 LAB — HEMOGLOBIN A1C: Hgb A1c MFr Bld: 7.1 % — ABNORMAL HIGH (ref 4.6–6.5)

## 2022-09-27 LAB — CBC WITH DIFFERENTIAL/PLATELET
Basophils Absolute: 0 10*3/uL (ref 0.0–0.1)
Basophils Relative: 0.4 % (ref 0.0–3.0)
Eosinophils Absolute: 0 10*3/uL (ref 0.0–0.7)
Eosinophils Relative: 0 % (ref 0.0–5.0)
HCT: 43 % (ref 39.0–52.0)
Hemoglobin: 14.1 g/dL (ref 13.0–17.0)
Lymphocytes Relative: 4.8 % — ABNORMAL LOW (ref 12.0–46.0)
Lymphs Abs: 0.4 10*3/uL — ABNORMAL LOW (ref 0.7–4.0)
MCHC: 32.8 g/dL (ref 30.0–36.0)
MCV: 94.6 fl (ref 78.0–100.0)
Monocytes Absolute: 0.2 10*3/uL (ref 0.1–1.0)
Monocytes Relative: 2.4 % — ABNORMAL LOW (ref 3.0–12.0)
Neutro Abs: 8 10*3/uL — ABNORMAL HIGH (ref 1.4–7.7)
Neutrophils Relative %: 92.4 % — ABNORMAL HIGH (ref 43.0–77.0)
Platelets: 250 10*3/uL (ref 150.0–400.0)
RBC: 4.54 Mil/uL (ref 4.22–5.81)
RDW: 14.4 % (ref 11.5–15.5)
WBC: 8.7 10*3/uL (ref 4.0–10.5)

## 2022-09-27 LAB — TSH: TSH: 1.03 u[IU]/mL (ref 0.35–5.50)

## 2022-09-27 LAB — VITAMIN B12: Vitamin B-12: 604 pg/mL (ref 211–911)

## 2022-09-27 LAB — MAGNESIUM: Magnesium: 1.8 mg/dL (ref 1.5–2.5)

## 2022-09-28 ENCOUNTER — Encounter: Payer: Medicare HMO | Admitting: Physical Therapy

## 2022-10-04 ENCOUNTER — Encounter: Payer: Medicare HMO | Admitting: Physical Therapy

## 2022-10-06 ENCOUNTER — Encounter: Payer: Medicare HMO | Admitting: Physical Therapy

## 2022-10-11 ENCOUNTER — Other Ambulatory Visit: Payer: Self-pay | Admitting: Family Medicine

## 2022-10-18 ENCOUNTER — Telehealth: Payer: Medicare HMO | Admitting: Family Medicine

## 2022-10-18 NOTE — Telephone Encounter (Signed)
See below, willing to do virtual for this?

## 2022-10-18 NOTE — Telephone Encounter (Signed)
Pt's spouse has some concerns regarding pt. She is asking for either an ov to come in to talk to provider (no available appts until Sept) or if he could give her a call. She asked for a call back at [phone].

## 2022-10-18 NOTE — Telephone Encounter (Signed)
Tried to connect with her this evening- I leave a voice message for her to call back- I'm ok with virtual but he has to be visible for at least part of the visit - phone visits are not typically covered.

## 2022-10-19 NOTE — Telephone Encounter (Signed)
See below

## 2022-10-20 ENCOUNTER — Encounter (INDEPENDENT_AMBULATORY_CARE_PROVIDER_SITE_OTHER): Payer: Self-pay

## 2022-10-27 ENCOUNTER — Other Ambulatory Visit: Payer: Self-pay | Admitting: Family Medicine

## 2022-11-04 DIAGNOSIS — H40053 Ocular hypertension, bilateral: Secondary | ICD-10-CM | POA: Diagnosis not present

## 2022-11-04 DIAGNOSIS — H401131 Primary open-angle glaucoma, bilateral, mild stage: Secondary | ICD-10-CM | POA: Diagnosis not present

## 2022-11-04 DIAGNOSIS — H40013 Open angle with borderline findings, low risk, bilateral: Secondary | ICD-10-CM | POA: Diagnosis not present

## 2022-11-15 ENCOUNTER — Encounter: Payer: Self-pay | Admitting: Family Medicine

## 2022-11-15 ENCOUNTER — Ambulatory Visit (INDEPENDENT_AMBULATORY_CARE_PROVIDER_SITE_OTHER): Payer: Medicare HMO | Admitting: Family Medicine

## 2022-11-15 VITALS — BP 120/50 | HR 65 | Temp 98.4°F | Ht 68.0 in | Wt 187.6 lb

## 2022-11-15 DIAGNOSIS — F0280 Dementia in other diseases classified elsewhere without behavioral disturbance: Secondary | ICD-10-CM

## 2022-11-15 DIAGNOSIS — N1831 Chronic kidney disease, stage 3a: Secondary | ICD-10-CM | POA: Diagnosis not present

## 2022-11-15 DIAGNOSIS — Z7984 Long term (current) use of oral hypoglycemic drugs: Secondary | ICD-10-CM

## 2022-11-15 DIAGNOSIS — E1122 Type 2 diabetes mellitus with diabetic chronic kidney disease: Secondary | ICD-10-CM | POA: Diagnosis not present

## 2022-11-15 DIAGNOSIS — I499 Cardiac arrhythmia, unspecified: Secondary | ICD-10-CM | POA: Diagnosis not present

## 2022-11-15 DIAGNOSIS — G309 Alzheimer's disease, unspecified: Secondary | ICD-10-CM | POA: Diagnosis not present

## 2022-11-15 DIAGNOSIS — I10 Essential (primary) hypertension: Secondary | ICD-10-CM

## 2022-11-15 DIAGNOSIS — E785 Hyperlipidemia, unspecified: Secondary | ICD-10-CM | POA: Diagnosis not present

## 2022-11-15 NOTE — Progress Notes (Signed)
Phone [phone] In person visit   Subjective:   Eddie Ferguson. Eddie Ferguson is a 82 y.o. year old very pleasant male patient who presents for/with See problem oriented charting Chief Complaint  Patient presents with   Dementia   Past Medical History-  Patient Active Problem List   Diagnosis Date Noted   Dementia due to Alzheimer's disease (HCC) 06/16/2015    Priority: High   History of prostate cancer-follows with Dr. Laverle Patter [facility] 05/28/2014    Priority: High   Type 2 diabetes mellitus with diabetic chronic kidney disease (HCC) 04/23/2014    Priority: High   Aortic atherosclerosis (HCC) 01/05/2018    Priority: Medium    CKD (chronic kidney disease), stage III (HCC) 04/18/2017    Priority: Medium    Gout 07/09/2015    Priority: Medium    Essential hypertension 04/23/2014    Priority: Medium    Hyperlipidemia 04/23/2014    Priority: Medium    Hypothyroidism 04/23/2014    Priority: Medium    Adenomatous colon polyp 05/28/2014    Priority: Low   Osteoarthritis 05/28/2014    Priority: Low   Erectile dysfunction 05/28/2014    Priority: Low   Basal cell carcinoma 05/28/2014    Priority: Low   GERD (gastroesophageal reflux disease) 04/23/2014    Priority: Low   Allergic rhinitis 04/23/2014    Priority: Low   Glaucoma 04/23/2014    Priority: Low   History of nonmelanoma skin cancer 11/18/2012    Priority: Low   Pain due to onychomycosis of toenails of both feet 08/17/2020   Porokeratosis 08/17/2020   Positive FIT (fecal immunochemical test) 06/17/2020   Intermittent diarrhea 06/17/2020   History of shingles 04/24/2011    Medications- reviewed and updated Current Outpatient Medications  Medication Sig Dispense Refill   acetaminophen (TYLENOL) 500 MG tablet Take 500 mg by mouth every 6 (six) hours as needed. Reported on 06/16/2015     allopurinol (ZYLOPRIM) 100 MG tablet TAKE 1 TABLET BY MOUTH TWICE A DAY 180 tablet 3   amLODipine (NORVASC) 5 MG tablet TAKE 1  TABLET BY MOUTH EVERY DAY 90 tablet 1   benazepril (LOTENSIN) 40 MG tablet TAKE 1 TABLET BY MOUTH EVERY DAY 90 tablet 3   carvedilol (COREG) 25 MG tablet TAKE 1/2 TABLETS BY MOUTH 2 TIMES DAILY WITH A MEAL. 90 tablet 3   chlorthalidone (HYGROTON) 25 MG tablet TAKE 1/2 TO 1 TABLET BY MOUTH DAILY 90 tablet 1   donepezil (ARICEPT) 10 MG tablet Take 1 tablet (10 mg total) by mouth daily. 30 tablet 11   famotidine (PEPCID) 20 MG tablet Take 20 mg by mouth daily.     hydrocortisone 2.5 % ointment Apply topically 2 (two) times daily. For 7-10 days for hemorrhoid tags- Can reuse in future but need at least 1 week break 30 g 2   KLOR-CON M20 20 MEQ tablet TAKE 2 TABLETS BY MOUTH TWICE A DAY 360 tablet 2   levothyroxine (SYNTHROID) 50 MCG tablet TAKE 1 TABLET BY MOUTH EVERY DAY 90 tablet 3   memantine (NAMENDA) 10 MG tablet Take 1 tablet twice a day 180 tablet 3   metFORMIN (GLUCOPHAGE) 500 MG tablet TAKE 1 TABLET BY MOUTH EVERY DAY WITH BREAKFAST 90 tablet 3   Multiple Vitamin (MULTIVITAMIN) tablet Take 1 tablet by mouth daily.     pravastatin (PRAVACHOL) 40 MG tablet TAKE 1 TABLET BY MOUTH EVERY DAY 90 tablet 3   No current facility-administered medications for this visit.  Objective:  BP (!) 120/50   Pulse 65   Temp 98.4 F (36.9 C)   Ht 5\' 8"  (1.727 m)   Wt 187 lb 9.6 oz (85.1 kg)   SpO2 94%   BMI 28.52 kg/m  Gen: NAD, resting comfortably CV: irregularly irregular , murmur LLSB noted  Ext: no edema Skin: warm, dry  EKG: Undetermined baseline rhythm-with rate variation and appearance of P waves without QRS complex in 1 segment do not feel confident in sinus rhythm designation.  Concern for potential heart block but do not see consistent pattern to meet Mobitz type I or II or complete heart block.  Left axis, normal intervals, no hypertrophy, no ST changes but does have some T wave flattening in V1 and V2     Assessment and Plan   #social update- will be moving to first floor in over  55 community from 3rd floor. He has had less mobility- son is buying it and they will have no issues selling old property.   # Irregular heartbeat-noted on exam and eko digital stethoscope suggestive of A-fib so we opted for formal EKG -Consider updating TSH, CBC, CBC but we do not have lab available at 5:00 after EKG was completed - Also had murmur we discussed possibility of echocardiogram but with pending referral to cardiology we opted to hold off -I had difficulty determining the underlying rhythm but did not appear consistent with atrial fibrillation to me-does appear to have P waves before each QRS complex.  Did not appear obviously to be Mobitz type I with no progressive lengthening of PR interval, did not appear to be Mobitz type II classically as substantial variation and rates before 1 randomly dropped P wave - Regardless I thought it was best to remove beta-blocker and see how he does with this while pending cardiology consult for their opinion in case there is a form of block.  Considered stopping calcium channel blocker but will reassess that in the future - He was asymptomatic as far as we are aware (prior pain issues seem to be related to hip and knee and fatigue has been an ongoing issue-and this EKG was done purely based off of exam and we discussed if he becomes symptomatic to seek care immediately  #Right hip and left knee pain- receiving injections through Dr. Shon Baton of sports medicine . Wife doesn't always know how to read his pain level and can be frustrating- makes facial expressions of pain but then denies pain. Sometimes will not get out of bed due to back or knee hurting. He declines tylenol even when she offers it. Spending more time in bed in day but still sleeps at night.    # Dementia- sees Dr. Karel Jarvis S:medication:  aricept/donepezil 10 mg daily and namenda/memantine 10 mg daily (previously as high as 23 mg and donepezil and memantine 10 mg twice daily) - Lower dose helped  with hallucinations but still has some paranoia. Prior increased sexual drive is better.  -wife managing medicines at this point -less interest in sitting at cpu like he used to do -watches tv or sleeps -has declined in recent weeks/months- wife may start looking at places for him long term- she did a support group yesterday A/P: Ongoing issues with dementia-offered support for patient and family - Would look at reducing medicines but did not see obvious pathway forward other than possibly blood pressure medicine - Briefly discussed antidepressant but opted to hold off -Mainly indoors and we did opt to remove his  Allegra  # Diabetes S: Medication:metformin 500mg  started 11/29/21- fatigue in past on this  Exercise and diet-  -down 11 lbs from July- less appetite and eating less of his portions Lab Results  Component Value Date   HGBA1C 7.1 (H) 09/26/2022   HGBA1C 6.9 (H) 05/25/2022   HGBA1C 7.6 (H) 11/26/2021   A/P: Reasonable control in light of dementia.  We will check A1c at follow-up-if continues to lose weight wonder if he may be able to stop metformin-would consider for any A1c under 7 honestly  #hypertension S: medication: Benazepril 40 mg, chlorthalidone 25 mg, amlodipine 5 mg, carvedilol 12.5 mg twice daily.  Does need potassium with chlorthalidone  Home readings #s: no recent checks at home BP Readings from Last 3 Encounters:  11/15/22 (!) 120/50  09/26/22 132/68  08/18/22 110/60  A/P: controlled but perhaps overcontrolled-eliminate carvedilol in light of possible heart block.  May need to alter amlodipine as well in future  #hyperlipidemia with aortic atherosclerosis -prior stroke on imaging S: Medication:Pravastatin 40 mg A/P: Without prior stroke on imaging we considered removing pravastatin but once we noted this from prior MRI we opted to continue current medication  #hypothyroidism S: compliant On thyroid medication-levothyroxine 50 mcg  Lab Results  Component  Value Date   TSH 1.03 09/26/2022  A/P: Controlled. Continue current medications.  Recommended follow up: Return for next already scheduled visit or sooner if needed.  We also discussed emergent reasons to seek care Future Appointments  Date Time Provider Department Center  12/02/2022 12:30 PM Helane Gunther, DPM [facility]  12/06/2022  1:00 PM Madelyn Brunner, DO [facility] None  12/21/2022 11:30 AM Marcos Eke, PA-C [facility] None  03/21/2023 11:15 AM [facility] ANNUAL WELLNESS VISIT 1 [facility]  03/29/2023 11:00 AM [name], MD [facility]    Lab/Order associations:   ICD-10-CM   1. Irregular heartbeat  I49.9 EKG 12-Lead    Ambulatory referral to Cardiology    2. Dementia due to Alzheimer's disease (HCC)  G30.9    F02.80     3. Type 2 diabetes mellitus with stage 3a chronic kidney disease, without long-term current use of insulin (HCC)  E11.22    N18.31     4. Essential hypertension  I10     5. Hyperlipidemia, unspecified hyperlipidemia type  E78.5       Time Spent: 40 minutes of total time (4:10 PM-4:50 PM) was spent on the date of the encounter performing the following actions: chart review prior to seeing the patient, obtaining history, performing a medically necessary exam, counseling on the treatment plan and reasoning for need for EKG, placing orders, and documenting in our EHR.    We spent additional time from 4:50 PM to 5:23 PM attempting to get EKG completed, reading EKG and then discussing plan after EKG-EKG reading was not included in the original 40 minutes.  Return precautions advised.  Tana Conch, MD

## 2022-11-15 NOTE — Patient Instructions (Addendum)
Stop carvedilol - have been taking half tablet of 25 mg twice a day. I am also going to refer you to cardiology for irregular heart beat- if chest pain, shortness of breath, worsening dizziness, please seek care immediately -monitor blood pressure and heart rate and update me in 2 days  Trial off allegra as indoors more and see if he tolerates that  We have placed a referral for you today to cardiology.you will see # listed below- you can call this if you have not hear within 2 days  Recommended follow up: Return for next already scheduled visit or sooner if needed.

## 2022-11-21 ENCOUNTER — Telehealth: Payer: Self-pay | Admitting: Family Medicine

## 2022-11-21 NOTE — Telephone Encounter (Signed)
Blood pressure still variable with one as low as 102/49 but others as high as 147/69  Stay off carvedilol- though I didn't love heart rate up to 100 or 111 the one day but on other days typically 80's or less.   With last blood pressure 137/62 I'm going to continue all other medications for now  Team what's status of URGENT cardiology referral from that date- last visit

## 2022-11-21 NOTE — Telephone Encounter (Signed)
Patient dropped off past blood pressure readings. Placed in provider box for review

## 2022-11-21 NOTE — Telephone Encounter (Signed)
In your review folder.

## 2022-11-22 NOTE — Telephone Encounter (Signed)
Called and spoke with pt and below message given, I spoke with Misty Stanley and she is going to reach out to Cardiology to have them call and schedule the pt.

## 2022-12-02 ENCOUNTER — Ambulatory Visit: Payer: Medicare HMO | Admitting: Podiatry

## 2022-12-05 ENCOUNTER — Ambulatory Visit (INDEPENDENT_AMBULATORY_CARE_PROVIDER_SITE_OTHER): Payer: Medicare HMO | Admitting: Podiatry

## 2022-12-05 ENCOUNTER — Telehealth: Payer: Self-pay | Admitting: Family Medicine

## 2022-12-05 ENCOUNTER — Encounter: Payer: Self-pay | Admitting: Podiatry

## 2022-12-05 DIAGNOSIS — B351 Tinea unguium: Secondary | ICD-10-CM

## 2022-12-05 DIAGNOSIS — M79675 Pain in left toe(s): Secondary | ICD-10-CM | POA: Diagnosis not present

## 2022-12-05 DIAGNOSIS — M79674 Pain in right toe(s): Secondary | ICD-10-CM | POA: Diagnosis not present

## 2022-12-05 DIAGNOSIS — N1831 Chronic kidney disease, stage 3a: Secondary | ICD-10-CM

## 2022-12-05 DIAGNOSIS — Q828 Other specified congenital malformations of skin: Secondary | ICD-10-CM

## 2022-12-05 DIAGNOSIS — N183 Chronic kidney disease, stage 3 unspecified: Secondary | ICD-10-CM

## 2022-12-05 DIAGNOSIS — E1122 Type 2 diabetes mellitus with diabetic chronic kidney disease: Secondary | ICD-10-CM | POA: Diagnosis not present

## 2022-12-05 NOTE — Progress Notes (Deleted)
This patient returns to my office for at risk foot care.  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  This patient is unable to cut nails himself since the patient cannot reach his nails.These nails are painful walking and wearing shoes. Eddie Ferguson He presents to the office with his wife. This patient presents for at risk foot care today.  General Appearance  Alert, conversant and in no acute stress.  Vascular  Dorsalis pedis and posterior tibial  pulses are palpable  bilaterally.  Capillary return is within normal limits  bilaterally. Temperature is within normal limits  bilaterally.  Neurologic  Senn-Weinstein monofilament wire test within normal limits  bilaterally. Muscle power within normal limits bilaterally.  Nails Thick disfigured discolored nails with subungual debris  hallux bilaterally. No evidence of bacterial infection or drainage bilaterally.  Orthopedic  No limitations of motion  feet .  No crepitus or effusions noted.  No bony pathology or digital deformities noted. Painful fifth toe left foot.  Skin  normotropic skin with no porokeratosis noted bilaterally.  No signs of infections or ulcers noted.   Callus sub 5th met B/L  symptomatic.  Onychomycosis  Pain in right toes  Pain in toes Hammer toe 5th left.  Consent was obtained for treatment procedures.   Mechanical debridement of nails 1-5  bilaterally performed with a nail nipper.  Filed with dremel without incident.  Padding dispensed fifth toe left foot.   Return office visit    3 months                  Told patient to return for periodic foot care and evaluation due to potential at risk complications.   Helane Gunther DPM

## 2022-12-05 NOTE — Progress Notes (Signed)
This patient returns to my office for at risk foot care.  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  This patient is unable to cut nails himself since the patient cannot reach his nails.These nails are painful walking and wearing shoes. Marland Kitchen He presents to the office with his wife.Painful callus on outside of both feet. This patient presents for at risk foot care today.  General Appearance  Alert, conversant and in no acute stress.  Vascular  Dorsalis pedis and posterior tibial  pulses are palpable  bilaterally.  Capillary return is within normal limits  bilaterally. Temperature is within normal limits  bilaterally.  Neurologic  Senn-Weinstein monofilament wire test within normal limits  bilaterally. Muscle power within normal limits bilaterally.  Nails Thick disfigured discolored nails with subungual debris  from hallux to fifth toes bilaterally. No evidence of bacterial infection or drainage bilaterally.  Orthopedic  No limitations of motion  feet .  No crepitus or effusions noted.  No bony pathology or digital deformities noted.  Skin  normotropic skin with no porokeratosis noted bilaterally.  No signs of infections or ulcers noted.   Callus sub 5th met B/L  symptomatic.  Onychomycosis  Pain in right toes  Pain in toes Callus sub 5th right foot.  Consent was obtained for treatment procedures.   Mechanical debridement of nails 1-5  bilaterally performed with a nail nipper.  Filed with dremel without incident.  Debride porokeratosis with # 15 blade.  Padding added to shoes.   Return office visit    3 months                  Told patient to return for periodic foot care and evaluation due to potential at risk complications.   Helane Gunther DPM

## 2022-12-05 NOTE — Telephone Encounter (Signed)
Patient dropped off list of blood pressure . Placed in providers box .

## 2022-12-06 ENCOUNTER — Encounter: Payer: Self-pay | Admitting: Sports Medicine

## 2022-12-06 ENCOUNTER — Other Ambulatory Visit: Payer: Self-pay

## 2022-12-06 ENCOUNTER — Ambulatory Visit: Payer: Medicare HMO | Admitting: Sports Medicine

## 2022-12-06 DIAGNOSIS — G309 Alzheimer's disease, unspecified: Secondary | ICD-10-CM

## 2022-12-06 DIAGNOSIS — M25551 Pain in right hip: Secondary | ICD-10-CM

## 2022-12-06 DIAGNOSIS — M1611 Unilateral primary osteoarthritis, right hip: Secondary | ICD-10-CM

## 2022-12-06 DIAGNOSIS — F0280 Dementia in other diseases classified elsewhere without behavioral disturbance: Secondary | ICD-10-CM

## 2022-12-06 MED ORDER — METHYLPREDNISOLONE ACETATE 40 MG/ML IJ SUSP
40.0000 mg | INTRAMUSCULAR | Status: AC | PRN
Start: 2022-12-06 — End: 2022-12-06
  Administered 2022-12-06: 40 mg via INTRA_ARTICULAR

## 2022-12-06 MED ORDER — LIDOCAINE HCL 1 % IJ SOLN
4.0000 mL | INTRAMUSCULAR | Status: AC | PRN
Start: 2022-12-06 — End: 2022-12-06
  Administered 2022-12-06: 4 mL

## 2022-12-06 NOTE — Progress Notes (Signed)
Eddie Ferguson. Bells - 82 y.o. male MRN [id number]  Date of birth: 17-Jul-1940  Office Visit Note: Visit Date: 12/06/2022 PCP: Eddie Majestic, MD Referred by: Eddie Majestic, MD  Subjective: Chief Complaint  Patient presents with   Right Hip - Pain   HPI: Eddie Ferguson is a pleasant 82 y.o. male who presents today for acute on chronic right hip pain. Patient presents with his wife, Eddie Ferguson, who provides majority of HPI.   He will has a chronic right hip pain, he does have severe end-stage osteoarthritis of the right hip.  Pain is mostly over the lateral and posterior aspect of the hip.  His wife Eddie Ferguson does note that he has significantly slowed down and his mobility has been limited.  He has tried physical therapy in the past and he did not enjoy this.  Using Tylenol and Voltaren gel as needed.  She does note that there is mention continues to worsen, he is following up with his PCP.  Does have a referral to cardiology as well for newer problem.  Pertinent ROS were reviewed with the patient and found to be negative unless otherwise specified above in HPI.   Assessment & Plan: Visit Diagnoses:  1. Pain in right hip   2. Unilateral primary osteoarthritis, right hip    Plan: Discussed with Eddie Ferguson and his wife Eddie Ferguson options for his right hip pain.  He does have some TTP over the greater trochanter, but more of his pain I think is emanating from the hip joint.  His x-rays show severe end-stage osteoarthritis.  We did proceed with ultrasound-guided right intra-articular hip injection.  Will see what degree of relief he gets over the next 1 to 2 weeks and they will update me on the progress.  He likely does not a candidate for hip replacement, but if this injection does not help significantly could consider a greater trochanteric injection.  In general his mobility has been limited and progressive, recommended follow-up with his PCP for his dementia and other care issues.  Recommend continue to use  with walker, wheelchair as needed.  May use Tylenol for pain control as needed.  Follow-up: Return in about 3 months (around 03/08/2023), or if symptoms worsen or fail to improve.   Meds & Orders: No orders of the defined types were placed in this encounter.   Orders Placed This Encounter  Procedures   Large Joint Inj: L hip joint   US Guided Needle Placement - No Linked Charges     Procedures: Large Joint Inj: R hip joint on 12/06/2022 1:12 PM Indications: pain Details: 22 G 3.5 in needle, ultrasound-guided anterior approach Medications: 4 mL lidocaine 1 %; 40 mg methylPREDNISolone acetate 40 MG/ML Outcome: tolerated well, no immediate complications  Procedure: US-guided intra-articular hip injection, Right After discussion on risks/benefits/indications and informed verbal consent was obtained, a timeout was performed. Patient was lying supine on exam table. The hip was cleaned with betadine and alcohol swabs. Then utilizing ultrasound guidance, the patient's femoral head and neck junction was identified and subsequently injected with 4:1 lidocaine:depomedrol via an in-plane approach with ultrasound visualization of the injectate administered into the hip joint. Patient tolerated procedure well without immediate complications.  Procedure, treatment alternatives, risks and benefits explained, specific risks discussed. Consent was given by the patient. Immediately prior to procedure a time out was called to verify the correct patient, procedure, equipment, support staff and site/side marked as required. Patient was prepped and draped in the usual  sterile fashion.          Clinical History: No specialty comments available.  He reports that he quit smoking about 21 years ago. His smoking use included cigarettes. He has never used smokeless tobacco.  Recent Labs    05/25/22 1048 09/26/22 1609  HGBA1C 6.9* 7.1*  LABURIC 5.4  --     Objective:    Physical Exam  Gen: Well-appearing,  in no acute distress; non-toxic CV: Well-perfused. Warm.  Resp: Breathing unlabored on room air; no wheezing. Psych: Fluid speech in conversation; appropriate affect; normal thought process Neuro: Sensation intact throughout. No gross coordination deficits.   Ortho Exam - Right hip: There is some tenderness to palpation over the lateral trochanter, no ASIS TTP.  Trace to small effusion noted without warmth or redness.  There is limited internal and external rotation with a positive FADIR test.  Patient walks very gingerly with unsteady gait, ambulated here in his wheelchair.  Imaging:  - 2 views of the left hip including AP and lateral femoral ordered and  reviewed by myself.  X-rays demonstrate mild to moderate arthritic change  and joint space narrowing of the left hip.  There is a spur of the  acetabular rim and a small drop osteophyte off the inferior femoral head.   There is near severe arthritic change of the right hip joint with  sclerosis.   Past Medical/Family/Surgical/Social History: Medications & Allergies reviewed per EMR, new medications updated. Patient Active Problem List   Diagnosis Date Noted   Pain due to onychomycosis of toenails of both feet 08/17/2020   Porokeratosis 08/17/2020   Positive FIT (fecal immunochemical test) 06/17/2020   Intermittent diarrhea 06/17/2020   Aortic atherosclerosis (HCC) 01/05/2018   CKD (chronic kidney disease), stage III (HCC) 04/18/2017   Gout 07/09/2015   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 Laverle Ferguson of [facility] 05/28/2014   Osteoarthritis 05/28/2014   Erectile dysfunction 05/28/2014   Basal cell carcinoma 05/28/2014   Essential hypertension 04/23/2014   Hyperlipidemia 04/23/2014   GERD (gastroesophageal reflux disease) 04/23/2014   Type 2 diabetes mellitus with diabetic chronic kidney disease (HCC) 04/23/2014    Hypothyroidism 04/23/2014   Allergic rhinitis 04/23/2014   Glaucoma 04/23/2014   History of nonmelanoma skin cancer 11/18/2012   History of shingles 04/24/2011   Past Medical History:  Diagnosis Date   Allergy    Ankle injury 06/01/2001   no surgery- skin debridement    Arthritis    Basal cell carcinoma 05/28/2014   L nasal tip. Mohs Dr. Park Liter.     Blood transfusion    2010 because of stomach ulcers   Cataract    bilaterally removed   Diabetes mellitus    GERD (gastroesophageal reflux disease)    Glaucoma    Hyperlipidemia    Hypertension    Hypothyroidism    Pneumonia 04/12/2006   Prostate cancer (HCC)    Seasonal allergies    Sinusitis    treated for bacterial infection at least once a year at [facility]   Stomach ulcer 2010   bleeding ulcer result NSAIDS   Family History  Problem Relation Age of Onset   Diabetes Mother    Diabetes Father    Hyperlipidemia Father    Colon polyps Father    Brain cancer Brother        smoker   Dementia Paternal Grandmother    Colon cancer Neg Hx  Esophageal cancer Neg Hx    Stomach cancer Neg Hx    Rectal cancer Neg Hx    Past Surgical History:  Procedure Laterality Date   abdominl abcess     03-15-2011   BACK SURGERY     CATARACT EXTRACTION, BILATERAL     04-07-2014, 03-17-2014   COLONOSCOPY     2012, 5 year repeat   CYSTOURETHROSCOPY  11/11/1998   ESOPHAGOGASTRODUODENOSCOPY  2010   PUD   LUMBAR LAMINECTOMY  2007   [place]   MOHS SURGERY     left side of nose   POLYPECTOMY     PROSTATE BIOPSY     radiation 2016   RECONSTRUCTION TENDON PULLEY W/ TENDON / FASCIAL GRAFT OF HAND / FINGER  1982   rt 3rd finger   RECONSTRUCTION TENDON PULLEY W/ TENDON / FASCIAL GRAFT OF HAND / FINGER  1953   rt 5th finger   UPPER GASTROINTESTINAL ENDOSCOPY     Social History   Occupational History   Occupation: retired  Tobacco Use   Smoking status: Former    Current packs/day: 0.00    Types: Cigarettes    Quit date: 02/03/2001     Years since quitting: 21.8   Smokeless tobacco: Never   Tobacco comments:    intermittent cigar use stopped  Vaping Use   Vaping status: Never Used  Substance and Sexual Activity   Alcohol use: Yes    Alcohol/week: 0.0 standard drinks of alcohol    Comment: occasional beer   Drug use: No   Sexual activity: Not Currently    Partners: Female

## 2022-12-07 NOTE — Telephone Encounter (Signed)
Pt bp readings are as follows:  11-22-22: 129/58 7:30 pm 11-23-22: 126/59 9:45 pm 11-26-22: 134/79 8:00 pm 11-27-22: 139/88 10:45 pm 11-28-22: 127/68 6:15 pm 11-29-22: 137/73 11:00 pm 11-30-22: 97/50 9:25 pm 12-01-22: 124/69 1:20 pm 12-02-22: 142/67 10:35 pm

## 2022-12-13 NOTE — Telephone Encounter (Signed)
Called and spoke with pt wife and below message given. 

## 2022-12-21 ENCOUNTER — Encounter: Payer: Self-pay | Admitting: Physician Assistant

## 2022-12-21 ENCOUNTER — Ambulatory Visit: Payer: Medicare HMO | Admitting: Physician Assistant

## 2022-12-21 VITALS — BP 128/71 | HR 98 | Resp 18 | Ht 68.0 in

## 2022-12-21 DIAGNOSIS — G309 Alzheimer's disease, unspecified: Secondary | ICD-10-CM | POA: Diagnosis not present

## 2022-12-21 DIAGNOSIS — F0280 Dementia in other diseases classified elsewhere without behavioral disturbance: Secondary | ICD-10-CM

## 2022-12-21 NOTE — Patient Instructions (Addendum)
It was a pleasure to see you today at our office.   Recommendations:  Follow up in April 22 at 1 pm   donepezil 10  mg daily. Side effects were discussed  Continue Memantine 10 mg twice daily.Side effects were discussed   Whom to call:  Memory  decline, memory medications: Call our office [phone]   For psychiatric meds, mood meds: Please have your primary care physician manage these medications.   Counseling regarding caregiver distress, including caregiver depression, anxiety and issues regarding community resources, adult day care programs, adult living facilities, or memory care questions:   Feel free to contact  your social worker     For assessment of decision of mental capacity and competency:  Call Dr. Erick Blinks, geriatric psychiatrist at [phone]  For guidance in geriatric dementia issues please call [facility] [phone]    If you have any severe symptoms of a stroke, or other severe issues such as confusion,severe chills or fever, etc call 911 or go to the ER as you may need to be evaluated further        RECOMMENDATIONS FOR ALL PATIENTS WITH MEMORY PROBLEMS: 1. Continue to exercise (Recommend 30 minutes of walking everyday, or 3 hours every week) 2. Increase social interactions - continue going to [place] and enjoy social gatherings with friends and family 3. Eat healthy, avoid fried foods and eat more fruits and vegetables 4. Maintain adequate blood pressure, blood sugar, and blood cholesterol level. Reducing the risk of stroke and cardiovascular disease also helps promoting better memory. 5. Avoid stressful situations. Live a simple life and avoid aggravations. Organize your time and prepare for the next day in anticipation. 6. Sleep well, avoid any interruptions of sleep and avoid any distractions in the bedroom that may interfere with adequate sleep quality 7. Avoid sugar, avoid sweets as there is a strong link between excessive sugar  intake, diabetes, and cognitive impairment We discussed the Mediterranean diet, which has been shown to help patients reduce the risk of progressive memory disorders and reduces cardiovascular risk. This includes eating fish, eat fruits and green leafy vegetables, nuts like almonds and hazelnuts, walnuts, and also use olive oil. Avoid fast foods and fried foods as much as possible. Avoid sweets and sugar as sugar use has been linked to worsening of memory function.  There is always a concern of gradual progression of memory problems. If this is the case, then we may need to adjust level of care according to patient needs. Support, both to the patient and caregiver, should then be put into place.    The Alzheimer's Association is here all day, every day for people facing Alzheimer's disease through our free 24/7 Helpline: 6131019626. The Helpline provides reliable information and support to all those who need assistance, such as individuals living with memory loss, Alzheimer's or other dementia, caregivers, health care professionals and the public.  Our highly trained and knowledgeable staff can help you with: Understanding memory loss, dementia and Alzheimer's  Medications and other treatment options  General information about aging and brain health  Skills to provide quality care and to find the best care from professionals  Legal, financial and living-arrangement decisions Our Helpline also features: Confidential care consultation provided by master's level clinicians who can help with decision-making support, crisis assistance and education on issues families face every day  Help in a caller's preferred language using our translation service that features more than 200 languages and dialects  Referrals to local community  programs, services and ongoing support     FALL PRECAUTIONS: Be cautious when walking. Scan the area for obstacles that may increase the risk of trips and falls. When  getting up in the mornings, sit up at the edge of the bed for a few minutes before getting out of bed. Consider elevating the bed at the head end to avoid drop of blood pressure when getting up. Walk always in a well-lit room (use night lights in the walls). Avoid area rugs or power cords from appliances in the middle of the walkways. Use a walker or a cane if necessary and consider physical therapy for balance exercise. Get your eyesight checked regularly.  FINANCIAL OVERSIGHT: Supervision, especially oversight when making financial decisions or transactions is also recommended.  HOME SAFETY: Consider the safety of the kitchen when operating appliances like stoves, microwave oven, and blender. Consider having supervision and share cooking responsibilities until no longer able to participate in those. Accidents with firearms and other hazards in the house should be identified and addressed as well.   ABILITY TO BE LEFT ALONE: If patient is unable to contact 911 operator, consider using LifeLine, or when the need is there, arrange for someone to stay with patients. Smoking is a fire hazard, consider supervision or cessation. Risk of wandering should be assessed by caregiver and if detected at any point, supervision and safe proof recommendations should be instituted.  MEDICATION SUPERVISION: Inability to self-administer medication needs to be constantly addressed. Implement a mechanism to ensure safe administration of the medications.       Mediterranean Diet A Mediterranean diet refers to food and lifestyle choices that are based on the traditions of countries located on the [employer]. This way of eating has been shown to help prevent certain conditions and improve outcomes for people who have chronic diseases, like kidney disease and heart disease. What are tips for following this plan? Lifestyle  Cook and eat meals together with your family, when possible. Drink enough fluid to keep  your urine clear or pale yellow. Be physically active every day. This includes: Aerobic exercise like running or swimming. Leisure activities like gardening, walking, or housework. Get 7-8 hours of sleep each night. If recommended by your health care provider, drink red wine in moderation. This means 1 glass a day for nonpregnant women and 2 glasses a day for men. A glass of wine equals 5 oz (150 mL). Reading food labels  Check the serving size of packaged foods. For foods such as rice and pasta, the serving size refers to the amount of cooked product, not dry. Check the total fat in packaged foods. Avoid foods that have saturated fat or trans fats. Check the ingredients list for added sugars, such as corn syrup. Shopping  At the grocery store, buy most of your food from the areas near the walls of the store. This includes: Fresh fruits and vegetables (produce). Grains, beans, nuts, and seeds. Some of these may be available in unpackaged forms or large amounts (in bulk). Fresh seafood. Poultry and eggs. Low-fat dairy products. Buy whole ingredients instead of prepackaged foods. Buy fresh fruits and vegetables in-season from local farmers markets. Buy frozen fruits and vegetables in resealable bags. If you do not have access to quality fresh seafood, buy precooked frozen shrimp or canned fish, such as tuna, salmon, or sardines. Buy small amounts of raw or cooked vegetables, salads, or olives from the deli or salad bar at your store. Stock Water quality scientist so you  always have certain foods on hand, such as olive oil, canned tuna, canned tomatoes, rice, pasta, and beans. Cooking  Cook foods with extra-virgin olive oil instead of using butter or other vegetable oils. Have meat as a side dish, and have vegetables or grains as your main dish. This means having meat in small portions or adding small amounts of meat to foods like pasta or stew. Use beans or vegetables instead of meat in common dishes  like chili or lasagna. Experiment with different cooking methods. Try roasting or broiling vegetables instead of steaming or sauteing them. Add frozen vegetables to soups, stews, pasta, or rice. Add nuts or seeds for added healthy fat at each meal. You can add these to yogurt, salads, or vegetable dishes. Marinate fish or vegetables using olive oil, lemon juice, garlic, and fresh herbs. Meal planning  Plan to eat 1 vegetarian meal one day each week. Try to work up to 2 vegetarian meals, if possible. Eat seafood 2 or more times a week. Have healthy snacks readily available, such as: Vegetable sticks with hummus. Greek yogurt. Fruit and nut trail mix. Eat balanced meals throughout the week. This includes: Fruit: 2-3 servings a day Vegetables: 4-5 servings a day Low-fat dairy: 2 servings a day Fish, poultry, or lean meat: 1 serving a day Beans and legumes: 2 or more servings a week Nuts and seeds: 1-2 servings a day Whole grains: 6-8 servings a day Extra-virgin olive oil: 3-4 servings a day Limit red meat and sweets to only a few servings a month What are my food choices? Mediterranean diet Recommended Grains: Whole-grain pasta. Brown rice. Bulgar wheat. Polenta. Couscous. Whole-wheat bread. Orpah Cobb. Vegetables: Artichokes. Beets. Broccoli. Cabbage. Carrots. Eggplant. Green beans. Chard. Kale. Spinach. Onions. Leeks. Peas. Squash. Tomatoes. Peppers. Radishes. Fruits: Apples. Apricots. Avocado. Berries. Bananas. Cherries. Dates. Figs. Grapes. Lemons. Melon. Oranges. Peaches. Plums. Pomegranate. Meats and other protein foods: Beans. Almonds. Sunflower seeds. Pine nuts. Peanuts. Cod. Salmon. Scallops. Shrimp. Tuna. Tilapia. Clams. Oysters. Eggs. Dairy: Low-fat milk. Cheese. Greek yogurt. Beverages: Water. Red wine. Herbal tea. Fats and oils: Extra virgin olive oil. Avocado oil. Grape seed oil. Sweets and desserts: [place] yogurt with honey. Baked apples. Poached pears. Trail  mix. Seasoning and other foods: Basil. Cilantro. Coriander. Cumin. Mint. Parsley. Sage. Rosemary. Tarragon. Garlic. Oregano. Thyme. Pepper. Balsalmic vinegar. Tahini. Hummus. Tomato sauce. Olives. Mushrooms. Limit these Grains: Prepackaged pasta or rice dishes. Prepackaged cereal with added sugar. Vegetables: Deep fried potatoes (french fries). Fruits: Fruit canned in syrup. Meats and other protein foods: Beef. Pork. Lamb. Poultry with skin. Hot dogs. Tomasa Blase. Dairy: Ice cream. Sour cream. Whole milk. Beverages: Juice. Sugar-sweetened soft drinks. Beer. Liquor and spirits. Fats and oils: Butter. Canola oil. Vegetable oil. Beef fat (tallow). Lard. Sweets and desserts: Cookies. Cakes. Pies. Candy. Seasoning and other foods: Mayonnaise. Premade sauces and marinades. The items listed may not be a complete list. Talk with your dietitian about what dietary choices are right for you. Summary The Mediterranean diet includes both food and lifestyle choices. Eat a variety of fresh fruits and vegetables, beans, nuts, seeds, and whole grains. Limit the amount of red meat and sweets that you eat. Talk with your health care provider about whether it is safe for you to drink red wine in moderation. This means 1 glass a day for nonpregnant women and 2 glasses a day for men. A glass of wine equals 5 oz (150 mL). This information is not intended to replace advice given to you by your  health care provider. Make sure you discuss any questions you have with your health care provider. Document Released: 10/15/2015 Document Revised: 11/17/2015 Document Reviewed: 10/15/2015 Elsevier Interactive Patient Education  2017 [employer].

## 2022-12-21 NOTE — Progress Notes (Signed)
Assessment/Plan:   Dementia likely due to Alzheimer's Disease, late onset     Chao Blazejewski. Spivack is a very pleasant 82 y.o. RH male with a history of hypertension, hyperlipidemia, diabetes, CKD stage III and a history of mild dementia likely due to Alzheimer's disease seen today in follow up for memory loss. Patient is currently on memantine 10 mg daily and donepezil 10  mg daily. His STM And LTM are affected with significant decline from prior visit. MMSE today is 15/30.  Patient is limited on his ADLs , requires more assistance than prior .   Recommendations    Follow up in  6 months. Continue donepezil 10 mg daily and memantine 10 mg daily.  Side effects discussed  Recommend good control of her cardiovascular risk factors Continue to control mood as per PCP Recommend caregiver support group for his wife as she may be experiencing caregiver distress, and HHN or a sitter.Also Memory Care facilities could be entertained for this nice patient    Subjective:    This patient is accompanied in the office by his wife  who supplements the history.  Previous records as well as any outside records available were reviewed prior to todays visit. Patient was last seen on 06/21/22 with MMSE 22/30     Any changes in memory since last visit? "Worse"-wife says. He forgets in less than  10 mins. Patient has  difficulty remembering recent conversations , new information and people's names.  He enjoys playing Solitaire, watch You Tube and sports.S topped reading his comics and Sports 2 months ago "at [employer] says. repeats oneself?  Endorsed Disoriented when walking into a room?  Patient denies    Leaving objects in unusual places?  Endorsed    Wandering behavior?  denies   Any personality changes since last visit? "Not as inquisitive as before" "If for example, I tell him I have a cough, he does not engage "-wife says  Any worsening depression?:  Denies.   Hallucinations or paranoia?  Denies.    Seizures? denies    Any sleep changes?  Denies vivid dreams, REM behavior or sleepwalking   Sleep apnea?   Denies.   Any hygiene concerns? Needs to be reminded and needs assistance to shower Independent of bathing and dressing?  He needs assistance to dress and tie his shoes.  Does the patient needs help with medications? Wife is in charge   Who is in charge of the finances?  Wife is in charge     Any changes in appetite?  Denies, does not drink enough water    Patient have trouble swallowing? Denies.   Does the patient cook? No Any headaches?   denies   Chronic back pain  denies   Ambulates with difficulty? 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 denies     Unilateral weakness, numbness or tingling? Chronic bilateral foot neuropathy Any tremors? Chronic, very mild L>R hand tremor, does not affect his ADLS, denies other parkinsonian signs Any anosmia?  Denies   Any incontinence of urine?  Endorsed, wears diapers (h/o prostate cancer, on active surveillance) Any bowel dysfunction? 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       Patient lives with wife   Does the patient drive? No longer drives     Initial visit 12/16/20 [name]. Tesler is a 82 y.o. male  right-handed man, with vascular risk  factors, including hypertension, hyperlipidemia, diabetes, with mild dementia likely due to Alzheimer's disease.  Due to continued progression, and SLUMS score of 16/ 30 in the prior visit, memantine was added on 07/2019 to donepezil 10 mg daily.   He is seen today in follow up for memory loss. This patient is accompanied in the office by his wife who supplements the history.  Previous records as well as any outside records available were reviewed prior to todays visit.     He reports his memory being "fair".  He has more difficulty remembering stories, and his wife states that he has the same  questions.  However, she states that he is sharper in the morning, and in the evening is worse at least 2 or 3 times a week.  He continues to live in an independent living community with his wife, but he does not like to "participate much in activities as I used to ".  His mood is "even".  Denies depression or irritability.  He likes to continue playing at the computer, spends significant amount of time there, as well as playing solitaire.  He sleeps well, denies any sleepwalking, vivid dreams, paranoia or hallucinations. He denies leaving objects in unusual places.  He is independent of bathing and dressing, medications and finances.  His appetite is good, denies trouble swallowing.  He does not cook.  He ambulates with the right cane "this is my best friend ", and denies any falls or head trauma recently.  Of note, he tried physical therapy in the recent past, but he according to his wife "he cannot remember the exercises to do, so says he did not enjoy it as much and he does not want to do it anymore ".  He does not drive, his wife takes him to the appointments.  He has chronic numbness in both feet, has a left knee brace.  He denies any headaches, dizziness, diplopia, dysarthria, dysphagia, neck or back pain, focal numbness or tingling or weakness, bowel or bladder dysfunction, anosmia or tremors.  PREVIOUS MEDICATIONS: donepezil (hallucinations and paranoia)   CURRENT MEDICATIONS:  Outpatient Encounter Medications as of 12/21/2022  Medication Sig   acetaminophen (TYLENOL) 500 MG tablet Take 500 mg by mouth every 6 (six) hours as needed. Reported on 06/16/2015   allopurinol (ZYLOPRIM) 100 MG tablet TAKE 1 TABLET BY MOUTH TWICE A DAY   amLODipine (NORVASC) 5 MG tablet TAKE 1 TABLET BY MOUTH EVERY DAY   benazepril (LOTENSIN) 40 MG tablet TAKE 1 TABLET BY MOUTH EVERY DAY   chlorthalidone (HYGROTON) 25 MG tablet TAKE 1/2 TO 1 TABLET BY MOUTH DAILY   donepezil (ARICEPT) 10 MG tablet Take 1 tablet (10 mg  total) by mouth daily.   famotidine (PEPCID) 20 MG tablet Take 20 mg by mouth daily.   hydrocortisone 2.5 % ointment Apply topically 2 (two) times daily. For 7-10 days for hemorrhoid tags- Can reuse in future but need at least 1 week break   KLOR-CON M20 20 MEQ tablet TAKE 2 TABLETS BY MOUTH TWICE A DAY   levothyroxine (SYNTHROID) 50 MCG tablet TAKE 1 TABLET BY MOUTH EVERY DAY   memantine (NAMENDA) 10 MG tablet Take 1 tablet twice a day   metFORMIN (GLUCOPHAGE) 500 MG tablet TAKE 1 TABLET BY MOUTH EVERY DAY WITH BREAKFAST   Multiple Vitamin (MULTIVITAMIN) tablet Take 1 tablet by mouth daily.   pravastatin (PRAVACHOL) 40 MG tablet TAKE 1 TABLET BY MOUTH EVERY DAY   No facility-administered encounter medications on file  as of 12/21/2022.       12/21/2022    3:00 PM 06/21/2022    2:00 PM 12/17/2021   11:00 AM  MMSE - Mini Mental State Exam  Orientation to time 0 3 3  Orientation to Place 1 3 4   Registration 3 3 3   Attention/ Calculation 2 5 5   Recall 0 0 0  Language- name 2 objects 2 1 2   Language- repeat 1 1 1   Language- follow 3 step command 3 3 3   Language- read & follow direction 1 1 1   Write a sentence 1 1 1   Copy design 1 1 1   Total score 15 22 24       12/16/2020    1:00 PM 05/10/2018    8:00 AM 09/26/2017   10:00 AM 03/27/2017    9:00 AM  Montreal Cognitive Assessment   Visuospatial/ Executive (0/5) 1 2 4 4   Naming (0/3) 2 3 2 3   Attention: Read list of digits (0/2) 2 2 2 2   Attention: Read list of letters (0/1) 1 1 1 1   Attention: Serial 7 subtraction starting at 100 (0/3) 3 3 3 3   Language: Repeat phrase (0/2) 0 2 2 2   Language : Fluency (0/1) 1 1 1 1   Abstraction (0/2) 0 2 2 2   Delayed Recall (0/5) 0 0 0 0  Orientation (0/6) 2 6 6 6   Total 12 22 23 24   Adjusted Score (based on education) 12       Objective:     PHYSICAL EXAMINATION:    VITALS:   Vitals:   12/21/22 1122  BP: 128/71  Pulse: 98  Resp: 18  SpO2: 95%  Height: 5\' 8"  (1.727 m)    GEN:   The patient appears stated age and is in NAD. HEENT:  Normocephalic, atraumatic.   Neurological examination:  General: NAD, well-groomed, appears stated age. Orientation: The patient is alert. Oriented to person, not to place and date Cranial nerves: There is good facial symmetry.The speech is fluent and clear but does not engage in conversation . No aphasia or dysarthria. Fund of knowledge is reduced. Recent and remote memory are impaired. Attention and concentration are reduced.  Able to name objects and repeat phrases.  Hearing is intact to conversational tone.   Sensation: Sensation is intact to light touch throughout Motor: Strength is at least antigravity x4. DTR's 2/4 in UE/LE     Movement examination: Tone: There is normal tone in the UE/LE. No cogwheeling Abnormal movements:  mild L>R resting tremor.  No myoclonus.  No asterixis.   Coordination:  There is no decremation with RAM's. Normal finger to nose  Gait and Station: The patient has some difficulty arising out of a deep-seated chair without the use of the hands.uses a walker. The patient's stride length is short.  Uses a walker ,Gait is cautious and narrow.    Thank you for allowing [place] the opportunity to participate in the care of this nice patient. Please do not hesitate to contact us for any questions or concerns.   Total time spent on today's visit was 37 minutes dedicated to this patient today, preparing to see patient, examining the patient, ordering tests and/or medications and counseling the patient, documenting clinical information in the EHR or other health record, independently interpreting results and communicating results to the patient/family, discussing treatment and goals, answering patient's questions and coordinating care.  Cc:  [name], MD  Marlowe Kays 12/21/2022 3:47 PM

## 2022-12-22 ENCOUNTER — Telehealth: Payer: Self-pay | Admitting: Sports Medicine

## 2022-12-22 NOTE — Telephone Encounter (Signed)
Patient's wife [name]  called advised patient is in a lot of pain and she want to know if he can get another injection in his right hip? The number to contact [name] is [phone]

## 2022-12-23 NOTE — Telephone Encounter (Signed)
Called and left [name] a message. Informed her that injections into the same area cannot be done that soon back to back, but that they can call to schedule an appointment to evaluate and possibly inject for greater trochanter.

## 2022-12-30 ENCOUNTER — Ambulatory Visit: Payer: Medicare HMO | Admitting: Sports Medicine

## 2022-12-30 ENCOUNTER — Encounter: Payer: Self-pay | Admitting: Sports Medicine

## 2022-12-30 ENCOUNTER — Other Ambulatory Visit: Payer: Self-pay

## 2022-12-30 DIAGNOSIS — M25551 Pain in right hip: Secondary | ICD-10-CM

## 2022-12-30 DIAGNOSIS — G309 Alzheimer's disease, unspecified: Secondary | ICD-10-CM

## 2022-12-30 DIAGNOSIS — M1611 Unilateral primary osteoarthritis, right hip: Secondary | ICD-10-CM

## 2022-12-30 DIAGNOSIS — G8929 Other chronic pain: Secondary | ICD-10-CM | POA: Diagnosis not present

## 2022-12-30 DIAGNOSIS — F0280 Dementia in other diseases classified elsewhere without behavioral disturbance: Secondary | ICD-10-CM

## 2022-12-30 MED ORDER — BUPIVACAINE HCL 0.25 % IJ SOLN
2.0000 mL | INTRAMUSCULAR | Status: AC | PRN
Start: 2022-12-30 — End: 2022-12-30
  Administered 2022-12-30: 2 mL via INTRA_ARTICULAR

## 2022-12-30 MED ORDER — LIDOCAINE HCL 1 % IJ SOLN
2.0000 mL | INTRAMUSCULAR | Status: AC | PRN
Start: 2022-12-30 — End: 2022-12-30
  Administered 2022-12-30: 2 mL

## 2022-12-30 MED ORDER — BETAMETHASONE SOD PHOS & ACET 6 (3-3) MG/ML IJ SUSP
6.0000 mg | INTRAMUSCULAR | Status: AC | PRN
  Administered 2022-12-30: 6 mg via INTRA_ARTICULAR

## 2022-12-30 NOTE — Progress Notes (Signed)
Eddie Ferguson - 82 y.o. male MRN [id number]  Date of birth: 1940/08/14  Office Visit Note: Visit Date: 12/30/2022 PCP: [name], MD Referred by: [name], MD  Subjective: Chief Complaint  Patient presents with   Right Hip - Pain   HPI: Eddie Ferguson "Eddie Ferguson" is a pleasant 82 y.o. male who presents today for acute on chronic right hip pain. Patient presents with his wife, Eddie Ferguson, who provides majority of HPI.   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  About 1 month ago we did proceed with ultrasound-guided intra-articular hip injection.  This did help some.  But more of his pain is over the lateral hip.  His wife, Eddie Ferguson, does mention that she will hear an audible popping and clicking sensation about the lateral hip with walking.  He has done physical therapy in the past but he did not enjoy doing this.  He uses Tylenol and Voltaren gel as needed.   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  His wife does note that his arms have been shaking more recently.  Pertinent ROS were reviewed with the patient and found to be negative unless otherwise specified above in HPI.   Assessment & Plan: Visit Diagnoses:  1. Chronic right hip pain   2. Unilateral primary osteoarthritis, right hip   3. Dementia due to Alzheimer's disease [facility])    Plan: Eddie Ferguson certainly has severe osteoarthritis of the right hip, did get a few weeks of relief from intra-articular injection prior.  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  He is looking for some sort of relief, given his pain over the greater trochanter, we did proceed with ultrasound-guided injection here.  I have seen [name] over the past year and his ambulatory status has significantly declined.  I do think the upper extremity shaking is a sign of his progressive Alzheimer's dementia as he is having  diminished motor control.  He will continue his follow-up with primary care as well as neurology.  We can consider repeating intra-articular hip injection or GT injection in a few months if provides benefit.  Follow-up: Return for f/u 2-3 months as needed for R-hip .   Meds & Orders: No orders of the defined types were placed in this encounter.   Orders Placed This Encounter  Procedures   Large Joint Inj: R greater trochanter   US Guided Needle Placement - No Linked Charges     Procedures: Large Joint Inj: R greater trochanter on 12/30/2022 2:30 PM Indications: pain Details: 22 G 3.5 in needle, ultrasound-guided lateral approach Medications: 2 mL lidocaine 1 %; 2 mL bupivacaine 0.25 %; 6 mg betamethasone acetate-betamethasone sodium phosphate 6 (3-3) MG/ML Outcome: tolerated well, no immediate complications  US-Guided Greater Trochanteric Bursa Injection, Right After discussion on risks/benefits/indications and informed verbal consent was obtained, a timeout was performed. The patient was lying in lateral recumbent position on exam table. Using ultrasound guidance, the greater trochanter was identified. The area overlying the trochanteric bursa was then prepped with Betadine and alcohol swabs. Following sterile precautions, ultrasound was reapplied to visualize needle guidance with a 22-gauge 3.5" needle utilizing an in-plane approach to inject the bursa with 2:2:1 lidocaine:bupivicaine:betamethasone. Delivery of the injectate was visualized into the region of hypoechoic fluid of the greater trochanteric bursa. Patient tolerated procedure well without immediate complications.    Procedure, treatment alternatives,  risks and benefits explained, specific risks discussed. Consent was given by the patient. Immediately prior to procedure a time out was called to verify the correct patient, procedure, equipment, support staff and site/side marked as required. Patient was prepped and draped in the  usual sterile fashion.          Clinical History: No specialty comments available.  He reports that he quit smoking about 21 years ago. His smoking use included cigarettes. He has never used smokeless tobacco.  Recent Labs    05/25/22 1048 09/26/22 1609  HGBA1C 6.9* 7.1*  LABURIC 5.4  --     Objective:    Physical Exam  Gen: Well-appearing, in no acute distress; non-toxic CV: Well-perfused. Warm.  Resp: Breathing unlabored on room air; no wheezing.  Ortho Exam - Right hip: Positive TTP over the greater trochanter of the hip.  There is diminished range of motion with internal and external logroll.  Patient is largely wheelchair dependent, there is unsteadiness with walking unsupported.  Imaging:  06/28/22: 2 views of the left hip including AP and lateral femoral ordered and  reviewed by myself.  X-rays demonstrate mild to moderate arthritic change  and joint space narrowing of the left hip.  There is a spur of the  acetabular rim and a small drop osteophyte off the inferior femoral head.   There is near severe arthritic change of the right hip joint with  sclerosis.   Past Medical/Family/Surgical/Social History: Medications & Allergies reviewed per EMR, new medications updated. Patient Active Problem List   Diagnosis Date Noted   Pain due to onychomycosis of toenails of both feet 08/17/2020   Porokeratosis 08/17/2020   Positive FIT (fecal immunochemical test) 06/17/2020   Intermittent diarrhea 06/17/2020   Aortic atherosclerosis (HCC) 01/05/2018   CKD (chronic kidney disease), stage III (HCC) 04/18/2017   Gout 07/09/2015   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 Mohs Dr. Park Liter.     Blood transfusion    2010 because of stomach ulcers   Cataract    bilaterally removed   Diabetes mellitus    GERD (gastroesophageal reflux disease)    Glaucoma    Hyperlipidemia    Hypertension    Hypothyroidism    Pneumonia 04/12/2006   Prostate cancer (HCC)    Seasonal allergies    Sinusitis    treated for bacterial infection at least once a year at [facility]   Stomach ulcer 2010   bleeding ulcer result NSAIDS   Family History  Problem Relation Age of Onset   Diabetes Mother    Diabetes Father    Hyperlipidemia Father    Colon polyps Father    Brain cancer Brother        smoker   Dementia  Paternal Grandmother    Colon cancer Neg Hx    Esophageal cancer Neg Hx    Stomach cancer Neg Hx    Rectal cancer Neg Hx    Past Surgical History:  Procedure Laterality Date   abdominl abcess     03-15-2011   BACK SURGERY     CATARACT EXTRACTION, BILATERAL     04-07-2014, 03-17-2014   COLONOSCOPY     2012, 5 year repeat   CYSTOURETHROSCOPY  11/11/1998   ESOPHAGOGASTRODUODENOSCOPY  2010   [facility]UD   LUM[place]CTOMY  2007   [place]   MOHS SURGERY     left side of nose   POLYPECTOMY     PROSTATE BIOPSY     radiation 2016   RECONSTRUCTION TENDON PULLEY W/ TENDON / FASCIAL GRAFT OF HAND / FINGER  1982   rt 3rd finger   RECONSTRUCTION TENDON PULLEY W/ TENDON / FASCIAL GRAFT OF HAND / FINGER  1953   rt 5th finger   UPPER GASTROINTESTINAL ENDOSCOPY     Social History   Occupational History    Occupation: retired  Tobacco Use   Smoking status: Former    Current packs/day: 0.00    Types: Cigarettes    Quit date: 02/03/2001    Years since quitting: 21.9   Smokeless tobacco: Never   Tobacco comments:    intermittent cigar use stopped  Vaping Use   Vaping status: Never Used  Substance and Sexual Activity   Alcohol use: Yes    Alcohol/week: 0.0 standard drinks of alcohol    Comment: occasional beer   Drug use: No   Sexual activity: Not Currently    Partners: Female   I spent 38 minutes in the care of the patient today including face-to-face time, preparation to see the patient, as well as review of previous imaging, discussion with patient and wife on concomitant diagnosis he is at his advancing dementia, desire for HEP versus PT, additional treatment options for the above diagnoses.   Madelyn Brunner, DO Primary Care Sports Medicine Physician  [facility]  This note was dictated using Dragon naturally speaking software and may contain errors in syntax, spelling, or content which have not been identified prior to signing this note.

## 2023-01-18 ENCOUNTER — Encounter: Payer: Self-pay | Admitting: Cardiology

## 2023-01-18 NOTE — Progress Notes (Unsigned)
Cardiology Office Note   Date:  01/19/2023   ID:  Eddie Ferguson. Kapono, Bicknell 1940-09-22, MRN [id number]  PCP:  [name], MD  Cardiologist:   None Referring:  [name], MD  Chief Complaint  Patient presents with   Abnormal ECG      History of Present Illness: Eddie Ferguson. Eddie Ferguson is a 82 y.o. male who is referred by [name], MD for evaluation of an irregular heart beat.  He lives in an over 3 community with his wife.  He has severe dementia.  He is very limited by joint problems and gets around for the most part in a wheelchair.  He can transfer from the wheelchair with assistance to the toilet in the bed.  He is unable to give any history and so he comes from his wife.  He was noted to have an episode of "feeling funny" sometime ago prior to his primary care office visit.  His wife noticed that he just did not look right but he was not presyncopal and did not have syncope.  She took his blood pressure and it was okay.  I do not think there was any arrhythmia.  He was seen by his primary provider and was noted to have sinus rhythm with premature atrial contractions.  There looks like there was some blocked PACs.  He is unable to give any history specifically of palpitations or chest discomfort.  There is been no witnessed demonstration of chest discomfort or shortness of breath.  He does not have PND or orthopnea.  He has not had any overt falls or syncope.  He has not had any prior cardiac workup.  Labs have been unremarkable.  Past Medical History:  Diagnosis Date   Arthritis    Basal cell carcinoma 05/28/2014   L nasal tip. Mohs Dr. Park Liter.     Cataract    bilaterally removed   Diabetes mellitus    GERD (gastroesophageal reflux disease)    Glaucoma    Hyperlipidemia    Hypertension    Hypothyroidism    Pneumonia 04/12/2006   Prostate cancer (HCC)    Seasonal allergies    Sinusitis    treated for bacterial infection at least once a year at [facility]    Stomach ulcer 2010   bleeding ulcer result NSAIDS    Past Surgical History:  Procedure Laterality Date   abdominl abcess     03-15-2011   BACK SURGERY     CATARACT EXTRACTION, BILATERAL     04-07-2014, 03-17-2014   CYSTOURETHROSCOPY  11/11/1998   LUMBAR LAMINECTOMY  2007   [place]   MOHS SURGERY     left side of nose   POLYPECTOMY     PROSTATE BIOPSY     radiation 2016   RECONSTRUCTION TENDON PULLEY W/ TENDON / FASCIAL GRAFT OF HAND / FINGER  1982   rt 3rd finger   RECONSTRUCTION TENDON PULLEY W/ TENDON / FASCIAL GRAFT OF HAND / FINGER  1953   rt 5th finger   UPPER GASTROINTESTINAL ENDOSCOPY       Current Outpatient Medications  Medication Sig Dispense Refill   acetaminophen (TYLENOL) 500 MG tablet Take 500 mg by mouth every 6 (six) hours as needed. Reported on 06/16/2015     allopurinol (ZYLOPRIM) 100 MG tablet TAKE 1 TABLET BY MOUTH TWICE A DAY 180 tablet 3   amLODipine (NORVASC) 5 MG tablet TAKE 1 TABLET BY MOUTH EVERY DAY 90 tablet 1   benazepril (  LOTENSIN) 40 MG tablet TAKE 1 TABLET BY MOUTH EVERY DAY 90 tablet 3   chlorthalidone (HYGROTON) 25 MG tablet TAKE 1/2 TO 1 TABLET BY MOUTH DAILY 90 tablet 1   donepezil (ARICEPT) 10 MG tablet Take 1 tablet (10 mg total) by mouth daily. 30 tablet 11   famotidine (PEPCID) 20 MG tablet Take 20 mg by mouth daily.     hydrocortisone 2.5 % ointment Apply topically 2 (two) times daily. For 7-10 days for hemorrhoid tags- Can reuse in future but need at least 1 week break 30 g 2   KLOR-CON M20 20 MEQ tablet TAKE 2 TABLETS BY MOUTH TWICE A DAY 360 tablet 2   levothyroxine (SYNTHROID) 50 MCG tablet TAKE 1 TABLET BY MOUTH EVERY DAY 90 tablet 3   memantine (NAMENDA) 10 MG tablet Take 1 tablet twice a day 180 tablet 3   metFORMIN (GLUCOPHAGE) 500 MG tablet TAKE 1 TABLET BY MOUTH EVERY DAY WITH BREAKFAST 90 tablet 3   Multiple Vitamin (MULTIVITAMIN) tablet Take 1 tablet by mouth daily.     pravastatin (PRAVACHOL) 40 MG tablet TAKE 1 TABLET BY MOUTH  EVERY DAY 90 tablet 3   No current facility-administered medications for this visit.    Allergies:   Aspirin, Nsaids, Amoxicillin-pot clavulanate, and Sulfa antibiotics    Social History:  The patient  reports that he quit smoking about 21 years ago. His smoking use included cigarettes. He has never used smokeless tobacco. He reports current alcohol use. He reports that he does not use drugs.   Family History:  The patient's family history includes Brain cancer in his brother; Colon polyps in his father; Dementia in his paternal grandmother; Diabetes in his father and mother; Hyperlipidemia in his father.    ROS:  Please see the history of present illness.   Otherwise, review of systems are positive for none.   All other systems are reviewed and negative.    PHYSICAL EXAM: VS:  BP 126/68 (BP Location: Left Arm, Patient Position: Sitting, Cuff Size: Normal)   Pulse 66   Ht 5\' 9"  (1.753 m)   Wt 179 lb (81.2 kg)   SpO2 97%   BMI 26.43 kg/m  , BMI Body mass index is 26.43 kg/m. GENERAL:  Frail appearing HEENT:  Pupils equal round and reactive, fundi not visualized, oral mucosa unremarkable NECK:  No jugular venous distention, waveform within normal limits, carotid upstroke brisk and symmetric, no bruits, no thyromegaly LYMPHATICS:  No cervical, inguinal adenopathy LUNGS:  Clear to auscultation bilaterally BACK:  No CVA tenderness CHEST:  Unremarkable HEART:  PMI not displaced or sustained,S1 and S2 within normal limits, no S3, no S4, no clicks, no rubs, 2 out of 6 apical systolic murmur radiating slightly at the aortic outflow tract, no diastolic murmurs ABD:  Flat, positive bowel sounds normal in frequency in pitch, no bruits, no rebound, no guarding, no midline pulsatile mass, no hepatomegaly, no splenomegaly EXT:  2 plus pulses throughout, no edema, no cyanosis no clubbing SKIN:  No rashes no nodules NEURO:  Cranial nerves II through XII grossly intact, motor grossly intact  throughout [facility]:  Cognitively intact, oriented to person place and time    EKG:  EKG Interpretation Date/Time:  Thursday January 19 2023 13:32:56 EST Ventricular Rate:  114 PR Interval:    QRS Duration:  86 QT Interval:  322 QTC Calculation: 443 R Axis:   -62  Text Interpretation: Multifocal atrial tachycardia Left anterior fascicular block compared to his PCP office  EKG of 2038/08/[birth year] the rhythm more consistently atrial tachycardia. Confirmed by Rollene Rotunda ([id number]) on 01/19/2023 1:37:52 PM     Recent Labs: 09/26/2022: ALT 13; BUN 16; Creatinine, Ser 1.33; Hemoglobin 14.1; Magnesium 1.8; Platelets 250.0; Potassium 4.2; Sodium 137; TSH 1.03    Lipid Panel    Component Value Date/Time   CHOL 133 05/25/2022 1048   TRIG 74.0 05/25/2022 1048   HDL 57.80 05/25/2022 1048   CHOLHDL 2 05/25/2022 1048   VLDL 14.8 05/25/2022 1048   LDLCALC 60 05/25/2022 1048   LDLDIRECT 90.0 09/28/2016 1045      Wt Readings from Last 3 Encounters:  01/19/23 179 lb (81.2 kg)  11/15/22 187 lb 9.6 oz (85.1 kg)  09/26/22 197 lb 12.8 oz (89.7 kg)      Other studies Reviewed: Additional studies/ records that were reviewed today include: Labs, outside EKG. Review of the above records demonstrates:  Please see elsewhere in the note.     ASSESSMENT AND PLAN:  Irregular heart beat:   He has no overt symptoms related to this other than the questionable vague event described above.  I am going to have him wear a 3 day ZIO.  He is already up-to-date with labs including a recent TSH.  At this point no change in therapy is indicated.  If this is unremarkable I will not change his therapy but if he has future events of "not feeling right" and we cannot otherwise diagnosis I might have to look for another arrhythmia and at that point he would probably do better with an implanted loop given his dementia rather than something he wears externally.  HTN: His blood pressure is well-controlled.  No change in  therapy.  Dyslipidemia: LDL was 60 with an HDL of 47.8.  No change in therapy.  DM: A1c is 7.1.  No change in therapy.   Current medicines are reviewed at length with the patient today.  The patient does not have concerns regarding medicines.  The following changes have been made:  no change  Labs/ tests ordered today include:   Orders Placed This Encounter  Procedures   EKG 12-Lead     Disposition:   FU with with me as needed based on the results of the above.     Signed, Rollene Rotunda, MD  01/19/2023 2:03 PM    [facility]

## 2023-01-19 ENCOUNTER — Other Ambulatory Visit: Payer: Self-pay | Admitting: Cardiology

## 2023-01-19 ENCOUNTER — Encounter: Payer: Self-pay | Admitting: Cardiology

## 2023-01-19 ENCOUNTER — Ambulatory Visit (INDEPENDENT_AMBULATORY_CARE_PROVIDER_SITE_OTHER): Payer: Medicare HMO

## 2023-01-19 ENCOUNTER — Ambulatory Visit: Payer: Medicare HMO | Attending: Cardiology | Admitting: Cardiology

## 2023-01-19 VITALS — BP 126/68 | HR 66 | Ht 69.0 in | Wt 179.0 lb

## 2023-01-19 DIAGNOSIS — I444 Left anterior fascicular block: Secondary | ICD-10-CM

## 2023-01-19 DIAGNOSIS — E1122 Type 2 diabetes mellitus with diabetic chronic kidney disease: Secondary | ICD-10-CM

## 2023-01-19 DIAGNOSIS — I4719 Other supraventricular tachycardia: Secondary | ICD-10-CM

## 2023-01-19 DIAGNOSIS — I491 Atrial premature depolarization: Secondary | ICD-10-CM

## 2023-01-19 DIAGNOSIS — I10 Essential (primary) hypertension: Secondary | ICD-10-CM | POA: Diagnosis not present

## 2023-01-19 DIAGNOSIS — I499 Cardiac arrhythmia, unspecified: Secondary | ICD-10-CM

## 2023-01-19 DIAGNOSIS — E785 Hyperlipidemia, unspecified: Secondary | ICD-10-CM

## 2023-01-19 DIAGNOSIS — N1831 Chronic kidney disease, stage 3a: Secondary | ICD-10-CM | POA: Diagnosis not present

## 2023-01-19 NOTE — Progress Notes (Unsigned)
Enrolled for [employer] to mail a ZIO XT long term holter monitor to the patients address on file.  

## 2023-01-19 NOTE — Patient Instructions (Signed)
Medication Instructions:  No changes. *If you need a refill on your cardiac medications before your next appointment, please call your pharmacy*    Testing/Procedures:  ZIO XT- Long Term Monitor Instructions   Your physician has requested you wear your ZIO patch monitor____3___days.   This is a single patch monitor.  [employer] supplies one patch monitor per enrollment.  Additional stickers are not available.   Please do not apply patch if you will be having a Nuclear Stress Test, Echocardiogram, Cardiac CT, MRI, or Chest Xray during the time frame you would be wearing the monitor. The patch cannot be worn during these tests.  You cannot remove and re-apply the ZIO XT patch monitor.   Your ZIO patch monitor will be sent USPS Priority mail from [facility] directly to your home address. The monitor may also be mailed to a PO BOX if home delivery is not available.   It may take 3-5 days to receive your monitor after you have been enrolled.   Once you have received you monitor, please review enclosed instructions.  Your monitor has already been registered assigning a specific monitor serial # to you.   Applying the monitor   Shave hair from upper left chest.   Hold abrader disc by orange tab.  Rub abrader in 40 strokes over left upper chest as indicated in your monitor instructions.   Clean area with 4 enclosed alcohol pads .  Use all pads to assure are is cleaned thoroughly.  Let dry.   Apply patch as indicated in monitor instructions.  Patch will be place under collarbone on left side of chest with arrow pointing upward.   Rub patch adhesive wings for 2 minutes.Remove white label marked "1".  Remove white label marked "2".  Rub patch adhesive wings for 2 additional minutes.   While looking in a mirror, press and release button in center of patch.  A small green light will flash 3-4 times .  This will be your only indicator the monitor has been turned on.     Do not shower for  the first 24 hours.  You may shower after the first 24 hours.   Press button if you feel a symptom. You will hear a small click.  Record Date, Time and Symptom in the Patient Log Book.   When you are ready to remove patch, follow instructions on last 2 pages of Patient Log Book.  Stick patch monitor onto last page of Patient Log Book.   Place Patient Log Book in [place] box.  Use locking tab on box and tape box closed securely.  The [employer] and [employer] has [employer] on it.  Please place in mailbox as soon as possible.  Your physician should have your test results approximately 7 days after the monitor has been mailed back to [facility].   Call [facility] Customer Care at [phone] if you have questions regarding your ZIO XT patch monitor.  Call them immediately if you see an orange light blinking on your monitor.   If your monitor falls off in less than 4 days contact our Monitor department at [phone].  If your monitor becomes loose or falls off after 4 days call [employer] at [phone] for suggestions on securing your monitor.     Follow-Up: At [facility], you and your health needs are our priority.  As part of our continuing mission to provide you with exceptional heart care, we have created designated Provider Care Teams.  These  Care Teams include your primary Cardiologist (physician) and Advanced Practice Providers (APPs -  Physician Assistants and Nurse Practitioners) who all work together to provide you with the care you need, when you need it.  We recommend signing up for the patient portal called "MyChart".  Sign up information is provided on this After Visit Summary.  MyChart is used to connect with patients for Virtual Visits (Telemedicine).  Patients are able to view lab/test results, encounter notes, upcoming appointments, etc.  Non-urgent messages can be sent to your provider as well.   To learn more about what you can do with MyChart, go to  [email or web address].    Your next appointment:   As needed.  Rollene Rotunda MD    Provider:

## 2023-01-22 ENCOUNTER — Other Ambulatory Visit: Payer: Self-pay | Admitting: Family Medicine

## 2023-01-22 DIAGNOSIS — I4719 Other supraventricular tachycardia: Secondary | ICD-10-CM

## 2023-01-22 DIAGNOSIS — I444 Left anterior fascicular block: Secondary | ICD-10-CM | POA: Diagnosis not present

## 2023-01-22 DIAGNOSIS — I491 Atrial premature depolarization: Secondary | ICD-10-CM

## 2023-01-26 ENCOUNTER — Other Ambulatory Visit: Payer: Self-pay | Admitting: Family Medicine

## 2023-01-31 ENCOUNTER — Telehealth: Payer: Self-pay | Admitting: Family Medicine

## 2023-01-31 NOTE — Telephone Encounter (Signed)
See below

## 2023-01-31 NOTE — Telephone Encounter (Signed)
Patient's wife needs letter stating Patient is incapacitated and is legally incompetent to make any kind of financial decisions or otherwise

## 2023-01-31 NOTE — Telephone Encounter (Signed)
May write letter  To whom it may concern,  Patient lacks capacity to make financial decisions for himself due to dementia  Thank you, Tana Conch, MD

## 2023-02-01 NOTE — Telephone Encounter (Signed)
Called and lm making aware letter is visible via mychart for printing.

## 2023-02-02 DIAGNOSIS — I491 Atrial premature depolarization: Secondary | ICD-10-CM | POA: Diagnosis not present

## 2023-02-13 ENCOUNTER — Other Ambulatory Visit: Payer: Self-pay

## 2023-02-13 MED ORDER — METOPROLOL SUCCINATE ER 50 MG PO TB24: 50.0000 mg | ORAL_TABLET | Freq: Every day | ORAL | 3 refills | Status: DC

## 2023-02-24 ENCOUNTER — Telehealth: Payer: Self-pay | Admitting: *Deleted

## 2023-02-24 NOTE — Telephone Encounter (Signed)
Spoke with pt wife, aware of dr hochrein's recommendations. 

## 2023-02-24 NOTE — Telephone Encounter (Signed)
Spoke with pt wife regarding recommendations of monitor. She reports they were prescribed the metoprolol but he was also taking carvedilol and when she went to the pharmacy they told her he should not be taking both, so she stopped the carvedilol. He does currently take amlodipine 5 mg once daily. She does not check his blood pressure on a regular basis so not sure what it is running. Will forward to dr hochrein to make sure the amlodipine needs to be stopped now. She reports when they saw dr hochrein he was not going to make any changes.

## 2023-02-24 NOTE — Telephone Encounter (Signed)
-----   Message from Rollene Rotunda sent at 02/12/2023 12:02 PM EST ----- There are runs of SVT.  I don't know if this is related to the symptoms that he is describing as they are vague.  However,   I would like to stop his Norvasc and start [name] 50 mg PO daily .  Disp number 90 with 3 refills.  Schedule follow up in two months with APP.  Call Mr. Minard with the results and send results to [name], MD

## 2023-02-27 ENCOUNTER — Ambulatory Visit: Payer: Medicare HMO | Admitting: Physician Assistant

## 2023-02-27 ENCOUNTER — Encounter: Payer: Self-pay | Admitting: Physician Assistant

## 2023-02-27 VITALS — BP 110/60 | HR 83 | Temp 97.7°F | Ht 69.0 in | Wt 170.0 lb

## 2023-02-27 DIAGNOSIS — F0280 Dementia in other diseases classified elsewhere without behavioral disturbance: Secondary | ICD-10-CM

## 2023-02-27 DIAGNOSIS — R110 Nausea: Secondary | ICD-10-CM | POA: Diagnosis not present

## 2023-02-27 DIAGNOSIS — G309 Alzheimer's disease, unspecified: Secondary | ICD-10-CM | POA: Diagnosis not present

## 2023-02-27 DIAGNOSIS — E1122 Type 2 diabetes mellitus with diabetic chronic kidney disease: Secondary | ICD-10-CM

## 2023-02-27 DIAGNOSIS — N1831 Chronic kidney disease, stage 3a: Secondary | ICD-10-CM | POA: Diagnosis not present

## 2023-02-27 DIAGNOSIS — E43 Unspecified severe protein-calorie malnutrition: Secondary | ICD-10-CM | POA: Diagnosis not present

## 2023-02-27 DIAGNOSIS — R1032 Left lower quadrant pain: Secondary | ICD-10-CM

## 2023-02-27 DIAGNOSIS — Z7984 Long term (current) use of oral hypoglycemic drugs: Secondary | ICD-10-CM

## 2023-02-27 NOTE — Patient Instructions (Signed)
It was great to see you!  Hold metformin for a few days -- up to a week -- see how this does with your bowels  Next steps might be to change Pepcid to Protonix -- message me if interested  Stay on top of bowel health -- if concerns for constipation, start miralax as needed  We will get blood work too -- if concerns for infection -- we can get a CT scan   Take care,  Jarold Motto PA-C

## 2023-02-27 NOTE — Progress Notes (Signed)
Eddie Ferguson. Renew is a 83 y.o. male here for a new problem.  History of Present Illness:   Chief Complaint  Patient presents with   Nausea    Pt c/o poor appetite, having nausea, burping x 1 month.    HPI  Nausea: Patient is here with his wife, Eddie Ferguson, who provides all of history today due to patient's dementia.  She reports that he has been having stomach issues including poor appetite, gagging, nausea for about a month. Denies any significant changes in his home life, regimen, medications that coincide with the symptoms. Does note that with his worsening dementia he has been sleeping more.  He is only eating about 1-2 meals per day. Wife is really uncertain of his baseline stools but states that over the past 3 weeks his stools have seemed to be more loose and he has had at least 3 incontinent accidents of liquidy stools. Denies recent intake of antibiotics. Denies known blood in stools or black stools. She states that he does not complain of pain with urination, decreased urination, abdominal pain. He has not had vomiting.  Typically only drinks caffeine-free iced tea; rarely drinks plain water  He is taking Pepcid daily and she will also give him as needed Pepto-Bismol which does seem to help his symptoms  He has significant ongoing weight loss --he has lost at least 40 pounds this year, has had at least 19% weight loss in the past 8 months.  He is diabetic and has been on metformin 500 mg daily for a little over 1 year at this point per his wife.  This is the second time that he has had to take this medication.   Past Medical History:  Diagnosis Date   Arthritis    Basal cell carcinoma 05/28/2014   L nasal tip. Mohs Dr. Park Liter.     Cataract    bilaterally removed   Diabetes mellitus    GERD (gastroesophageal reflux disease)    Glaucoma    Hyperlipidemia    Hypertension    Hypothyroidism    Pneumonia 04/12/2006   Prostate cancer (HCC)    Seasonal allergies     Sinusitis    treated for bacterial infection at least once a year at [facility]   Stomach ulcer 2010   bleeding ulcer result NSAIDS     Social History   Tobacco Use   Smoking status: Former    Current packs/day: 0.00    Types: Cigarettes    Quit date: 02/03/2001    Years since quitting: 22.0   Smokeless tobacco: Never   Tobacco comments:    intermittent cigar use stopped  Vaping Use   Vaping status: Never Used  Substance Use Topics   Alcohol use: Yes    Alcohol/week: 0.0 standard drinks of alcohol    Comment: occasional beer   Drug use: No    Past Surgical History:  Procedure Laterality Date   abdominl abcess     03-15-2011   BACK SURGERY     CATARACT EXTRACTION, BILATERAL     04-07-2014, 03-17-2014   CYSTOURETHROSCOPY  11/11/1998   LUM[place] LAMINECTOMY  2007   [place]   MOHS SURGERY     left side of nose   POLYPECTOMY     PROSTATE BIOPSY     radiation 2016   RECONSTRUCTION TENDON PULLEY W/ TENDON / FASCIAL GRAFT OF HAND / FINGER  1982   rt 3rd finger   RECONSTRUCTION TENDON PULLEY W/ TENDON / FASCIAL GRAFT OF HAND /  FINGER  1953   rt 5th finger   UPPER GASTROINTESTINAL ENDOSCOPY      Family History  Problem Relation Age of Onset   Diabetes Mother    Diabetes Father    Hyperlipidemia Father    Colon polyps Father    Brain cancer Brother        smoker   Dementia Paternal Grandmother    Colon cancer Neg Hx    Esophageal cancer Neg Hx    Stomach cancer Neg Hx    Rectal cancer Neg Hx     Allergies  Allergen Reactions   Aspirin Other (See Comments)    Stomach ulcers   Nsaids Other (See Comments)    Stomach ulcers   Amoxicillin-Pot Clavulanate Rash   Sulfa Antibiotics Swelling and Rash    Current Medications:   Current Outpatient Medications:    acetaminophen (TYLENOL) 500 MG tablet, Take 500 mg by mouth every 6 (six) hours as needed. Reported on 06/16/2015, Disp: , Rfl:    allopurinol (ZYLOPRIM) 100 MG tablet, TAKE 1 TABLET BY MOUTH TWICE A DAY, Disp: 180  tablet, Rfl: 3   benazepril (LOTENSIN) 40 MG tablet, TAKE 1 TABLET BY MOUTH EVERY DAY, Disp: 90 tablet, Rfl: 3   chlorthalidone (HYGROTON) 25 MG tablet, TAKE 1/2 TO 1 TABLET BY MOUTH DAILY, Disp: 90 tablet, Rfl: 1   donepezil (ARICEPT) 10 MG tablet, Take 1 tablet (10 mg total) by mouth daily., Disp: 30 tablet, Rfl: 11   famotidine (PEPCID) 20 MG tablet, Take 20 mg by mouth daily., Disp: , Rfl:    hydrocortisone 2.5 % ointment, Apply topically 2 (two) times daily. For 7-10 days for hemorrhoid tags- Can reuse in future but need at least 1 week break, Disp: 30 g, Rfl: 2   KLOR-CON M20 20 MEQ tablet, TAKE 2 TABLETS BY MOUTH TWICE A DAY, Disp: 360 tablet, Rfl: 2   latanoprost (XALATAN) 0.005 % ophthalmic solution, Place 1 drop into both eyes at bedtime., Disp: , Rfl:    levothyroxine (SYNTHROID) 50 MCG tablet, TAKE 1 TABLET BY MOUTH EVERY DAY, Disp: 90 tablet, Rfl: 3   memantine (NAMENDA) 10 MG tablet, Take 1 tablet twice a day, Disp: 180 tablet, Rfl: 3   metFORMIN (GLUCOPHAGE) 500 MG tablet, TAKE 1 TABLET BY MOUTH EVERY DAY WITH BREAKFAST, Disp: 90 tablet, Rfl: 3   metoprolol succinate (TOPROL-XL) 50 MG 24 hr tablet, Take 1 tablet (50 mg total) by mouth daily. Take with or immediately following a meal., Disp: 90 tablet, Rfl: 3   Multiple Vitamin (MULTIVITAMIN) tablet, Take 1 tablet by mouth daily., Disp: , Rfl:    pravastatin (PRAVACHOL) 40 MG tablet, TAKE 1 TABLET BY MOUTH EVERY DAY, Disp: 90 tablet, Rfl: 3   Review of Systems:   ROS Negative unless otherwise specified per HPI.  Vitals:   Vitals:   02/27/23 1506  BP: 110/60  Pulse: 83  Temp: 97.7 F (36.5 C)  TempSrc: Temporal  SpO2: 97%  Weight: 170 lb (77.1 kg)  Height: 5\' 9"  (1.753 m)     Body mass index is 25.1 kg/m.  Physical Exam:   Physical Exam Vitals and nursing note reviewed.  Constitutional:      General: He is not in acute distress.    Appearance: He is well-developed. He is not ill-appearing or toxic-appearing.   HENT:     Head: Normocephalic.  Eyes:     Conjunctiva/sclera: Conjunctivae normal.     Pupils: Pupils are equal, round, and reactive to light.  Cardiovascular:     Rate and Rhythm: Normal rate and regular rhythm.     Pulses: Normal pulses.     Heart sounds: Normal heart sounds, S1 normal and S2 normal.  Pulmonary:     Effort: Pulmonary effort is normal.     Breath sounds: Normal breath sounds.  Abdominal:     General: Bowel sounds are normal.     Palpations: Abdomen is soft.     Tenderness: There is abdominal tenderness in the left lower quadrant. There is no right CVA tenderness, left CVA tenderness, guarding or rebound.  Musculoskeletal:        General: Normal range of motion.     Cervical back: Normal range of motion.  Skin:    General: Skin is warm and dry.  Neurological:     Mental Status: He is alert and oriented to person, place, and time.     GCS: GCS eye subscore is 4. GCS verbal subscore is 5. GCS motor subscore is 6.  Psychiatric:        Speech: Speech normal.        Behavior: Behavior normal. Behavior is cooperative.        Thought Content: Thought content normal.        Judgment: Judgment normal.     Assessment and Plan:   LLQ pain Very mild on my exam Most recent colonoscopy does show diverticulosis, wife denies any history of diverticulitis for patient. She does not want a pursue a CT scan at this point as symptom(s) are not severe I did ask if we could at least update a white blood cell count on CBC and/or change in symptoms will order CT scan for evaluation -- she was agreeable - ordering today If sudden onset worsening or severe pain was instructed to go to the emergency room  Dementia due to Alzheimer's disease [facility]) Reviewed most recent neurology note Wife is excellent caregiver for patient  Severe protein-calorie malnutrition (HCC) Given extensive weight loss over the past year due to progression of dementia, meets criteria for this Recommend  close follow-up with primary care provider after improvement of current symptoms to help strategize ways to maintain weight Consider dietitian referral  Nausea Unclear etiology Differential diagnosis includes but is not limited to: Gastritis, peptic ulcer disease, constipation, IBS, medication side effect, diverticulitis We did discuss that metformin could be contributing to this and due to extensive weight loss, recommend holding this medication for about a week to see if this makes a difference -- if it does not we could consider changing Pepcid to pantoprazole to see if we have better reduction in acid or GERD symptoms Could consider H. pylori testing however he has been taking Pepto-bismol which may skew results so we did not conduct this today Offered Zofran, but wife declined Also recheck kidney function to ensure not worsening and causing worsening nausea Wife declined checking UA today  Type 2 diabetes mellitus with stage 3a chronic kidney disease, without long-term current use of insulin (HCC) Suspect well controlled Due to worsening GI symptoms will hold metformin to see if contributing to current symptoms Recommend close follow-up with PCP to discuss goals of blood sugar management  I, Isabelle Course, acting as a Neurosurgeon for Jarold Motto, [place]., have documented all relevant documentation on the behalf of Jarold Motto, [place], as directed by  Jarold Motto, PA while in the presence of Jarold Motto, [place].  I, Jarold Motto, [place], have reviewed all documentation for this visit. The documentation on  02/27/23 for the exam, diagnosis, procedures, and orders are all accurate and complete.  I spent a total of 85 minutes on this visit, today 02/27/23, which included reviewing previous notes from PCP, Neurology, ordering tests, discussing plan of care with patient and using shared-decision making on next steps, and documenting the findings in the note.    Jarold Motto, PA-C

## 2023-02-28 ENCOUNTER — Other Ambulatory Visit: Payer: Self-pay | Admitting: Physician Assistant

## 2023-02-28 ENCOUNTER — Ambulatory Visit (HOSPITAL_BASED_OUTPATIENT_CLINIC_OR_DEPARTMENT_OTHER)
Admission: RE | Admit: 2023-02-28 | Discharge: 2023-02-28 | Disposition: A | Discharge status: Home / Self Care | Payer: Medicare HMO | Source: Ambulatory Visit | Attending: Physician Assistant | Admitting: Physician Assistant

## 2023-02-28 DIAGNOSIS — K5730 Diverticulosis of large intestine without perforation or abscess without bleeding: Secondary | ICD-10-CM | POA: Diagnosis not present

## 2023-02-28 DIAGNOSIS — R1032 Left lower quadrant pain: Secondary | ICD-10-CM | POA: Insufficient documentation

## 2023-02-28 DIAGNOSIS — K439 Ventral hernia without obstruction or gangrene: Secondary | ICD-10-CM | POA: Diagnosis not present

## 2023-02-28 DIAGNOSIS — D72829 Elevated white blood cell count, unspecified: Secondary | ICD-10-CM

## 2023-02-28 LAB — COMPREHENSIVE METABOLIC PANEL
ALT: 11 U/L (ref 0–53)
AST: 15 U/L (ref 0–37)
Albumin: 3.5 g/dL (ref 3.5–5.2)
Alkaline Phosphatase: 63 U/L (ref 39–117)
BUN: 14 mg/dL (ref 6–23)
CO2: 30 meq/L (ref 19–32)
Calcium: 9.5 mg/dL (ref 8.4–10.5)
Chloride: 100 meq/L (ref 96–112)
Creatinine, Ser: 0.91 mg/dL (ref 0.40–1.50)
GFR: 77.26 mL/min (ref >60.00)
Glucose, Bld: 128 mg/dL — ABNORMAL HIGH (ref 70–99)
Potassium: 4 meq/L (ref 3.5–5.1)
Sodium: 138 meq/L (ref 135–145)
Total Bilirubin: 0.4 mg/dL (ref 0.2–1.2)
Total Protein: 6.3 g/dL (ref 6.0–8.3)

## 2023-02-28 LAB — CBC WITH DIFFERENTIAL/PLATELET
Basophils Absolute: 0.1 10*3/uL (ref 0.0–0.1)
Basophils Relative: 0.7 % (ref 0.0–3.0)
Eosinophils Absolute: 0 10*3/uL (ref 0.0–0.7)
Eosinophils Relative: 0.4 % (ref 0.0–5.0)
HCT: 37 % — ABNORMAL LOW (ref 39.0–52.0)
Hemoglobin: 12.2 g/dL — ABNORMAL LOW (ref 13.0–17.0)
Lymphocytes Relative: 10.1 % — ABNORMAL LOW (ref 12.0–46.0)
Lymphs Abs: 1.1 10*3/uL (ref 0.7–4.0)
MCHC: 33 g/dL (ref 30.0–36.0)
MCV: 90.7 fL (ref 78.0–100.0)
Monocytes Absolute: 0.9 10*3/uL (ref 0.1–1.0)
Monocytes Relative: 8.1 % (ref 3.0–12.0)
Neutro Abs: 9.1 10*3/uL — ABNORMAL HIGH (ref 1.4–7.7)
Neutrophils Relative %: 80.7 % — ABNORMAL HIGH (ref 43.0–77.0)
Platelets: 376 10*3/uL (ref 150.0–400.0)
RBC: 4.08 Mil/uL — ABNORMAL LOW (ref 4.22–5.81)
RDW: 15.6 % — ABNORMAL HIGH (ref 11.5–15.5)
WBC: 11.3 10*3/uL — ABNORMAL HIGH (ref 4.0–10.5)

## 2023-02-28 MED ORDER — METRONIDAZOLE 500 MG PO TABS: 500.0000 mg | ORAL_TABLET | Freq: Three times a day (TID) | ORAL | 0 refills | Status: AC

## 2023-02-28 MED ORDER — CIPROFLOXACIN HCL 500 MG PO TABS: 500.0000 mg | ORAL_TABLET | Freq: Two times a day (BID) | ORAL | 0 refills | Status: AC

## 2023-02-28 MED ORDER — IOHEXOL 300 MG/ML  SOLN
100.0000 mL | Freq: Once | INTRAMUSCULAR | Status: AC | PRN
  Administered 2023-02-28: 85 mL via INTRAVENOUS

## 2023-03-02 NOTE — Progress Notes (Signed)
Please call pt to schedule lab visit for Thursday or Friday of this week. Thank You

## 2023-03-03 ENCOUNTER — Other Ambulatory Visit (INDEPENDENT_AMBULATORY_CARE_PROVIDER_SITE_OTHER): Payer: Medicare HMO

## 2023-03-03 DIAGNOSIS — D72829 Elevated white blood cell count, unspecified: Secondary | ICD-10-CM | POA: Diagnosis not present

## 2023-03-03 DIAGNOSIS — R824 Acetonuria: Secondary | ICD-10-CM | POA: Diagnosis not present

## 2023-03-03 DIAGNOSIS — G309 Alzheimer's disease, unspecified: Secondary | ICD-10-CM | POA: Diagnosis not present

## 2023-03-03 DIAGNOSIS — F0280 Dementia in other diseases classified elsewhere without behavioral disturbance: Secondary | ICD-10-CM | POA: Diagnosis not present

## 2023-03-03 LAB — URINALYSIS, ROUTINE W REFLEX MICROSCOPIC
Bilirubin Urine: NEGATIVE
Hgb urine dipstick: NEGATIVE
Leukocytes,Ua: NEGATIVE
Nitrite: NEGATIVE
Specific Gravity, Urine: 1.025 (ref 1.000–1.030)
Total Protein, Urine: NEGATIVE
Urine Glucose: NEGATIVE
Urobilinogen, UA: 0.2 (ref 0.0–1.0)
pH: 7.5 (ref 5.0–8.0)

## 2023-03-03 LAB — CBC WITH DIFFERENTIAL/PLATELET
Basophils Absolute: 0 10*3/uL (ref 0.0–0.1)
Basophils Relative: 0.3 % (ref 0.0–3.0)
Eosinophils Absolute: 0.1 10*3/uL (ref 0.0–0.7)
Eosinophils Relative: 1 % (ref 0.0–5.0)
HCT: 37.5 % — ABNORMAL LOW (ref 39.0–52.0)
Hemoglobin: 12.3 g/dL — ABNORMAL LOW (ref 13.0–17.0)
Lymphocytes Relative: 8.5 % — ABNORMAL LOW (ref 12.0–46.0)
Lymphs Abs: 0.9 10*3/uL (ref 0.7–4.0)
MCHC: 32.7 g/dL (ref 30.0–36.0)
MCV: 90 fL (ref 78.0–100.0)
Monocytes Absolute: 1 10*3/uL (ref 0.1–1.0)
Monocytes Relative: 8.6 % (ref 3.0–12.0)
Neutro Abs: 9 10*3/uL — ABNORMAL HIGH (ref 1.4–7.7)
Neutrophils Relative %: 81.6 % — ABNORMAL HIGH (ref 43.0–77.0)
Platelets: 371 10*3/uL (ref 150.0–400.0)
RBC: 4.17 Mil/uL — ABNORMAL LOW (ref 4.22–5.81)
RDW: 15.4 % (ref 11.5–15.5)
WBC: 11.1 10*3/uL — ABNORMAL HIGH (ref 4.0–10.5)

## 2023-03-03 LAB — COMPREHENSIVE METABOLIC PANEL
ALT: 12 U/L (ref 0–53)
AST: 16 U/L (ref 0–37)
Albumin: 3.4 g/dL — ABNORMAL LOW (ref 3.5–5.2)
Alkaline Phosphatase: 63 U/L (ref 39–117)
BUN: 13 mg/dL (ref 6–23)
CO2: 32 meq/L (ref 19–32)
Calcium: 9.2 mg/dL (ref 8.4–10.5)
Chloride: 95 meq/L — ABNORMAL LOW (ref 96–112)
Creatinine, Ser: 1.05 mg/dL (ref 0.40–1.50)
GFR: 65.06 mL/min (ref >60.00)
Glucose, Bld: 238 mg/dL — ABNORMAL HIGH (ref 70–99)
Potassium: 3.2 meq/L — ABNORMAL LOW (ref 3.5–5.1)
Sodium: 136 meq/L (ref 135–145)
Total Bilirubin: 0.4 mg/dL (ref 0.2–1.2)
Total Protein: 6.1 g/dL (ref 6.0–8.3)

## 2023-03-04 LAB — URINE CULTURE
MICRO NUMBER:: 15894349
Result:: NO GROWTH
SPECIMEN QUALITY:: ADEQUATE

## 2023-03-06 ENCOUNTER — Ambulatory Visit (INDEPENDENT_AMBULATORY_CARE_PROVIDER_SITE_OTHER): Payer: Medicare HMO | Admitting: Podiatry

## 2023-03-06 ENCOUNTER — Encounter: Payer: Self-pay | Admitting: Podiatry

## 2023-03-06 ENCOUNTER — Telehealth: Payer: Self-pay | Admitting: Family Medicine

## 2023-03-06 DIAGNOSIS — Q828 Other specified congenital malformations of skin: Secondary | ICD-10-CM | POA: Diagnosis not present

## 2023-03-06 DIAGNOSIS — B351 Tinea unguium: Secondary | ICD-10-CM | POA: Diagnosis not present

## 2023-03-06 DIAGNOSIS — E1122 Type 2 diabetes mellitus with diabetic chronic kidney disease: Secondary | ICD-10-CM | POA: Diagnosis not present

## 2023-03-06 DIAGNOSIS — M79674 Pain in right toe(s): Secondary | ICD-10-CM | POA: Diagnosis not present

## 2023-03-06 DIAGNOSIS — M79675 Pain in left toe(s): Secondary | ICD-10-CM | POA: Diagnosis not present

## 2023-03-06 DIAGNOSIS — N1831 Type 2 diabetes mellitus with diabetic chronic kidney disease: Secondary | ICD-10-CM

## 2023-03-06 NOTE — Telephone Encounter (Signed)
Patient's spouse call into call center and spoke with [place]. Freddy informed me that patient's spouse was returning office's call.

## 2023-03-06 NOTE — Progress Notes (Signed)
This patient returns to my office for at risk foot care.  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  This patient is unable to cut nails himself since the patient cannot reach his nails.These nails are painful walking and wearing shoes. Marland Kitchen He presents to the office with his wife.Painful callus on outside of both feet. This patient presents for at risk foot care today.  General Appearance  Alert, conversant and in no acute stress.  Vascular  Dorsalis pedis and posterior tibial  pulses are palpable  bilaterally.  Capillary return is within normal limits  bilaterally. Temperature is within normal limits  bilaterally.  Neurologic  Senn-Weinstein monofilament wire test within normal limits  bilaterally. Muscle power within normal limits bilaterally.  Nails Thick disfigured discolored nails with subungual debris  from hallux to fifth toes bilaterally. No evidence of bacterial infection or drainage bilaterally.  Orthopedic  No limitations of motion  feet .  No crepitus or effusions noted.  No bony pathology or digital deformities noted.  Skin  normotropic skin with no porokeratosis noted bilaterally.  No signs of infections or ulcers noted.   Callus sub 5th met B/L  symptomatic.  Onychomycosis  Pain in right toes  Pain in toes Callus sub 5th right foot.  Consent was obtained for treatment procedures.   Mechanical debridement of nails 1-5  bilaterally performed with a nail nipper.  Filed with dremel without incident.  Debride porokeratosis with # 15 blade.  Padding added to shoes.   Return office visit  10 weeks                 Told patient to return for periodic foot care and evaluation due to potential at risk complications.   Helane Gunther DPM

## 2023-03-07 ENCOUNTER — Telehealth: Payer: Self-pay | Admitting: Family Medicine

## 2023-03-07 NOTE — Telephone Encounter (Signed)
 Copied from CRM [id number]. Topic: Clinical - Medical Advice >> Mar 07, 2023  9:26 AM Elizebeth Brooking wrote: Reason for CRM: Patient wife called having some questions for [facility], requesting a callback at [phone]

## 2023-03-07 NOTE — Telephone Encounter (Signed)
 Lets give him a full 2 or 3 weeks off the medication to see if this helps-then update me again-I appreciate her keeping [place] in the loop

## 2023-03-07 NOTE — Telephone Encounter (Signed)
 Please see message and advise

## 2023-03-07 NOTE — Telephone Encounter (Signed)
 I have spoken with spouse in regard.    Spouse states Eddie Ferguson took patient off of Metformin  to see if there would be improvement with nauseas and loose bowels.    Spouse states medication was ceased on 12/24.    States patient is still experiencing nausea and loose bowels.    Would like to know what next steps are.   I will include Ferguson and [employer].

## 2023-03-09 ENCOUNTER — Other Ambulatory Visit: Payer: Self-pay

## 2023-03-09 ENCOUNTER — Ambulatory Visit: Payer: Medicare HMO | Admitting: Sports Medicine

## 2023-03-09 ENCOUNTER — Encounter: Payer: Self-pay | Admitting: Sports Medicine

## 2023-03-09 DIAGNOSIS — G8929 Other chronic pain: Secondary | ICD-10-CM | POA: Diagnosis not present

## 2023-03-09 DIAGNOSIS — G309 Alzheimer's disease, unspecified: Secondary | ICD-10-CM | POA: Diagnosis not present

## 2023-03-09 DIAGNOSIS — M1611 Unilateral primary osteoarthritis, right hip: Secondary | ICD-10-CM

## 2023-03-09 DIAGNOSIS — F0280 Dementia in other diseases classified elsewhere without behavioral disturbance: Secondary | ICD-10-CM | POA: Diagnosis not present

## 2023-03-09 DIAGNOSIS — D72829 Elevated white blood cell count, unspecified: Secondary | ICD-10-CM

## 2023-03-09 DIAGNOSIS — M25551 Pain in right hip: Secondary | ICD-10-CM

## 2023-03-09 DIAGNOSIS — Z8711 Personal history of peptic ulcer disease: Secondary | ICD-10-CM | POA: Diagnosis not present

## 2023-03-09 MED ORDER — MELOXICAM 15 MG PO TABS: 15.0000 mg | ORAL_TABLET | Freq: Every day | ORAL | 0 refills | Status: DC

## 2023-03-09 MED ORDER — METHYLPREDNISOLONE ACETATE 40 MG/ML IJ SUSP
40.0000 mg | INTRAMUSCULAR | Status: AC | PRN
  Administered 2023-03-09: 40 mg via INTRA_ARTICULAR

## 2023-03-09 MED ORDER — LIDOCAINE HCL 1 % IJ SOLN
4.0000 mL | INTRAMUSCULAR | Status: AC | PRN
  Administered 2023-03-09: 4 mL

## 2023-03-09 NOTE — Progress Notes (Signed)
 Eddie Ferguson. Eddie Ferguson - 83 y.o. male MRN [id number]  Date of birth: 12-08-1940  Office Visit Note: Visit Date: 03/09/2023 PCP: Katrinka Garnette KIDD, MD Referred by: Katrinka Garnette KIDD, MD  Subjective: Chief Complaint  Patient presents with   Right Hip - Pain   HPI: Eddie Ferguson is a pleasant 83 y.o. male who presents today for follow-up of right hip pain with OA.  He is here with his wife, Eddie Ferguson, who does provide majority of HPI.  He has severe end-stage arthritis about the hip.  He has received about 1 to 2 weeks relief of intra-articular injection as well as greater trochanteric injection in the past.  His wife states that he will awake at night and does hear an audible popping sensation over the lateral hip when walking.  He is essentially wheelchair-bound at this point as he has progressed both with his dementia as well as his physical mobility.  She was asking about Mobic /meloxicam  as a treatment option for him as well.  She told me today that he does have a history of gastric ulcers that was about 14-15 years ago but has not had any issues with this since.  Pertinent ROS were reviewed with the patient and found to be negative unless otherwise specified above in HPI.   Assessment & Plan: Visit Diagnoses:  1. Chronic right hip pain   2. Unilateral primary osteoarthritis, right hip   3. Dementia due to Alzheimer's disease (HCC)   4. History of stomach ulcers    Plan: Impression is acute exacerbation of chronic underlying right hip pain with advanced hip arthritis.  Through shared decision making, we did proceed with ultrasound-guided intra-articular injection.  He may use Tylenol, ice, Voltaren  gel as needed for postinjection pain.  We will do a trial of a low-dose of meloxicam  15 mg to be taken once daily only as needed for breakthrough pain.  I am okay with this given that his gastric ulcers were 15 years ago, they will check for side effects or any GI upset.  We may clear this with his  primary as well.  Given his advanced Alzheimer's dementia and his significant comorbidities, he is not a candidate for hip replacement.  I will see him back in a few weeks and we will see how the lateral hip is doing, could consider an injection here in this location if necessary for his pain.  Follow-up: Return in about 20 days (around 03/29/2023) for R-hip/lat hip  Meds & Orders:  Meds ordered this encounter  Medications   meloxicam  (MOBIC ) 15 MG tablet    Sig: Take 1 tablet (15 mg total) by mouth daily.    Dispense:  30 tablet    Refill:  0    Orders Placed This Encounter  Procedures   Large Joint Inj   US  Guided Needle Placement - No Linked Charges     Procedures: Large Joint Inj: R hip joint on 03/09/2023 1:43 PM Indications: pain Details: 22 G 3.5 in needle, ultrasound-guided anterior approach Medications: 4 mL lidocaine  1 %; 40 mg methylPREDNISolone  acetate 40 MG/ML Outcome: tolerated well, no immediate complications  Procedure: US -guided intra-articular hip injection, Right After discussion on risks/benefits/indications and informed verbal consent was obtained, a timeout was performed. Patient was lying supine on exam table. The hip was cleaned with betadine and alcohol swabs. Then utilizing ultrasound guidance, the patient's femoral head and neck junction was identified and subsequently injected with 4:1 lidocaine :depomedrol via an in-plane approach with ultrasound visualization  of the injectate administered into the hip joint. Patient tolerated procedure well without immediate complications.  Procedure, treatment alternatives, risks and benefits explained, specific risks discussed. Consent was given by the patient. Immediately prior to procedure a time out was called to verify the correct patient, procedure, equipment, support staff and site/side marked as required. Patient was prepped and draped in the usual sterile fashion.          Clinical History: No specialty comments  available.  He reports that he quit smoking about 22 years ago. His smoking use included cigarettes. He has never used smokeless tobacco.  Recent Labs    05/25/22 1048 09/26/22 1609  HGBA1C 6.9* 7.1*  LABURIC 5.4  --     Objective:    Physical Exam  Gen: Well-appearing, in no acute distress; non-toxic CV: Well-perfused. Warm.  Resp: Breathing unlabored on room air; no wheezing. Psych: Fluid speech in conversation; appropriate affect; normal thought process  Ortho Exam - Right hip: No redness swelling or effusion.  There is significant limited mobility about the right hip with pain in all directions including FADIR.  The patient is essentially wheelchair-bound and does not ambulate on his own.  Imaging:  - 06/28/22: 2 views of the left hip including AP and lateral femoral ordered and  reviewed by myself.  X-rays demonstrate mild to moderate arthritic change  and joint space narrowing of the left hip.  There is a spur of the  acetabular rim and a small drop osteophyte off the inferior femoral head.   There is near severe arthritic change of the right hip joint with  sclerosis.   Past Medical/Family/Surgical/Social History: Medications & Allergies reviewed per EMR, new medications updated. Patient Active Problem List   Diagnosis Date Noted   Pain due to onychomycosis of toenails of both feet 08/17/2020   Porokeratosis 08/17/2020   Positive FIT (fecal immunochemical test) 06/17/2020   Intermittent diarrhea 06/17/2020   Aortic atherosclerosis (HCC) 01/05/2018   CKD (chronic kidney disease), stage III (HCC) 04/18/2017   Gout 07/09/2015   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 Renda of [facility] 05/28/2014   Osteoarthritis 05/28/2014   Erectile dysfunction 05/28/2014   Basal cell carcinoma 05/28/2014   Essential hypertension 04/23/2014   Hyperlipidemia 04/23/2014   GERD  (gastroesophageal reflux disease) 04/23/2014   Type 2 diabetes mellitus with diabetic chronic kidney disease (HCC) 04/23/2014   Hypothyroidism 04/23/2014   Allergic rhinitis 04/23/2014   Glaucoma 04/23/2014   History of nonmelanoma skin cancer 11/18/2012   History of shingles 04/24/2011   Past Medical History:  Diagnosis Date   Arthritis    Basal cell carcinoma 05/28/2014   L nasal tip. Mohs Dr. Anniece.     Cataract    bilaterally removed   Diabetes mellitus    GERD (gastroesophageal reflux disease)    Glaucoma    Hyperlipidemia    Hypertension    Hypothyroidism    Pneumonia 04/12/2006   Prostate cancer (HCC)    Seasonal allergies    Sinusitis    treated for bacterial infection at least once a year at [facility]   Stomach ulcer 2010   bleeding ulcer result NSAIDS   Family History  Problem Relation Age of Onset   Diabetes Mother    Diabetes Father    Hyperlipidemia Father    Colon polyps Father    Brain cancer Brother  smoker   Dementia Paternal Grandmother    Colon cancer Neg Hx    Esophageal cancer Neg Hx    Stomach cancer Neg Hx    Rectal cancer Neg Hx    Past Surgical History:  Procedure Laterality Date   abdominl abcess     03-15-2011   BACK SURGERY     CATARACT EXTRACTION, BILATERAL     04-07-2014, 03-17-2014   CYSTOURETHROSCOPY  11/11/1998   LUMBAR LAMINECTOMY  2007   [place]    MOHS SURGERY     left side of nose   POLYPECTOMY     PROSTATE BIOPSY     radiation 2016   RECONSTRUCTION TENDON PULLEY W/ TENDON / FASCIAL GRAFT OF HAND / FINGER  1982   rt 3rd finger   RECONSTRUCTION TENDON PULLEY W/ TENDON / FASCIAL GRAFT OF HAND / FINGER  1953   rt 5th finger   UPPER GASTROINTESTINAL ENDOSCOPY     Social History   Occupational History   Occupation: retired  Tobacco Use   Smoking status: Former    Current packs/day: 0.00    Types: Cigarettes    Quit date: 02/03/2001    Years since quitting: 22.1   Smokeless tobacco: Never   Tobacco comments:     intermittent cigar use stopped  Vaping Use   Vaping status: Never Used  Substance and Sexual Activity   Alcohol use: Yes    Alcohol/week: 0.0 standard drinks of alcohol    Comment: occasional beer   Drug use: No   Sexual activity: Not Currently    Partners: Female

## 2023-03-09 NOTE — Telephone Encounter (Signed)
 Called and lm for pt spouse to cb, see previous telephone encounter for instructions when spouse calls back.

## 2023-03-09 NOTE — Telephone Encounter (Signed)
 Called and lm for pt wife tcb. When pt wife calls back please give her Dr. Erasmo Leventhal message below.

## 2023-03-15 ENCOUNTER — Other Ambulatory Visit: Payer: Medicare HMO

## 2023-03-15 DIAGNOSIS — D72829 Elevated white blood cell count, unspecified: Secondary | ICD-10-CM

## 2023-03-15 LAB — CBC WITH DIFFERENTIAL/PLATELET
Basophils Absolute: 0.1 10*3/uL (ref 0.0–0.1)
Basophils Relative: 0.4 % (ref 0.0–3.0)
Eosinophils Absolute: 0 10*3/uL (ref 0.0–0.7)
Eosinophils Relative: 0.3 % (ref 0.0–5.0)
HCT: 40.7 % (ref 39.0–52.0)
Hemoglobin: 13 g/dL (ref 13.0–17.0)
Lymphocytes Relative: 10.8 % — ABNORMAL LOW (ref 12.0–46.0)
Lymphs Abs: 1.4 10*3/uL (ref 0.7–4.0)
MCHC: 32 g/dL (ref 30.0–36.0)
MCV: 91.5 fL (ref 78.0–100.0)
Monocytes Absolute: 0.9 10*3/uL (ref 0.1–1.0)
Monocytes Relative: 6.7 % (ref 3.0–12.0)
Neutro Abs: 10.3 10*3/uL — ABNORMAL HIGH (ref 1.4–7.7)
Neutrophils Relative %: 81.8 % — ABNORMAL HIGH (ref 43.0–77.0)
Platelets: 376 10*3/uL (ref 150.0–400.0)
RBC: 4.45 Mil/uL (ref 4.22–5.81)
RDW: 16.1 % — ABNORMAL HIGH (ref 11.5–15.5)
WBC: 12.6 10*3/uL — ABNORMAL HIGH (ref 4.0–10.5)

## 2023-03-15 LAB — HEMOGLOBIN A1C: Hgb A1c MFr Bld: 7.4 % — ABNORMAL HIGH (ref 4.6–6.5)

## 2023-03-18 ENCOUNTER — Encounter: Payer: Self-pay | Admitting: Family Medicine

## 2023-03-20 ENCOUNTER — Other Ambulatory Visit: Payer: Self-pay

## 2023-03-20 DIAGNOSIS — D72829 Elevated white blood cell count, unspecified: Secondary | ICD-10-CM

## 2023-03-21 ENCOUNTER — Ambulatory Visit (INDEPENDENT_AMBULATORY_CARE_PROVIDER_SITE_OTHER): Payer: Medicare HMO

## 2023-03-21 VITALS — BP 130/70 | Temp 98.2°F | Wt 168.0 lb

## 2023-03-21 DIAGNOSIS — G309 Alzheimer's disease, unspecified: Secondary | ICD-10-CM | POA: Diagnosis not present

## 2023-03-21 DIAGNOSIS — F0280 Dementia in other diseases classified elsewhere without behavioral disturbance: Secondary | ICD-10-CM | POA: Diagnosis not present

## 2023-03-21 DIAGNOSIS — Z0000 Encounter for general adult medical examination without abnormal findings: Secondary | ICD-10-CM | POA: Diagnosis not present

## 2023-03-21 NOTE — Patient Instructions (Signed)
 [name] , Thank you for taking time to come for your Medicare Wellness Visit. I appreciate your ongoing commitment to your health goals. Please review the following plan we discussed and let me know if I can assist you in the future.   Referrals/Orders/Follow-Ups/Clinician Recommendations: encouraged to increase mobility  Each day, aim for 6 glasses of water, plenty of protein in your diet and try to get up and walk/ stretch every hour for 5-10 minutes at a time.    This is a list of the screening recommended for you and due dates:  Health Maintenance  Topic Date Due   Yearly kidney health urinalysis for diabetes  11/27/2022   COVID-19 Vaccine (8 - 2024-25 season) 05/01/2023   Eye exam for diabetics  07/04/2023   Complete foot exam   08/18/2023   Hemoglobin A1C  09/12/2023   Yearly kidney function blood test for diabetes  03/02/2024   Medicare Annual Wellness Visit  03/20/2024   DTaP/Tdap/Td vaccine (3 - Td or Tdap) 04/23/2024   Pneumonia Vaccine  Completed   Flu Shot  Completed   Zoster (Shingles) Vaccine  Completed   HPV Vaccine  Aged Out   Colon Cancer Screening  Discontinued    Advanced directives: (Copy Requested) Please bring a copy of your health care power of attorney and living will to the office to be added to your chart at your convenience.  Next Medicare Annual Wellness Visit scheduled for next year: Yes

## 2023-03-21 NOTE — Addendum Note (Signed)
 Addended by: [name] on: 03/21/2023 07:19 PM   Modules accepted: Orders

## 2023-03-21 NOTE — Telephone Encounter (Signed)
 Yes, its ordered and I documented under lab results.

## 2023-03-21 NOTE — Progress Notes (Signed)
 Subjective:   Eddie Ferguson is a 83 y.o. male who presents for Medicare Annual/Subsequent preventive examination.  Visit Complete: In person   Cardiac Risk Factors include: advanced age (>54men, >63 women);male gender;hypertension;dyslipidemia;diabetes mellitus;sedentary lifestyle     Objective:    Today's Vitals   03/21/23 1106  BP: 130/70  Temp: 98.2 F (36.8 C)  SpO2: 97%  Weight: 168 lb (76.2 kg)   Body mass index is 24.81 kg/m.     03/21/2023   11:33 AM 12/21/2022   11:55 AM 08/30/2022    2:03 PM 06/21/2022   11:13 AM 03/15/2022   11:22 AM 12/17/2021   10:58 AM 06/16/2021   11:13 AM  Advanced Directives  Does Patient Have a Medical Advance Directive? Yes Yes No Yes Yes Yes Yes  Type of Estate Agent of [place];Living will Healthcare Power of [employer] Power of [employer] Power of [place];Living will  Healthcare Power of [place];Living will;Out of facility DNR (pink MOST or yellow form)  Does patient want to make changes to medical advance directive?  No - Patient declined  No - Guardian declined     Copy of Healthcare Power of Attorney in Chart? No - copy requested No - copy requested  No - copy requested No - copy requested    Would patient like information on creating a medical advance directive?   No - Patient declined        Current Medications (verified) Outpatient Encounter Medications as of 03/21/2023  Medication Sig   acetaminophen (TYLENOL) 500 MG tablet Take 500 mg by mouth every 6 (six) hours as needed. Reported on 06/16/2015   allopurinol  (ZYLOPRIM ) 100 MG tablet TAKE 1 TABLET BY MOUTH TWICE A DAY   benazepril  (LOTENSIN ) 40 MG tablet TAKE 1 TABLET BY MOUTH EVERY DAY   chlorthalidone  (HYGROTON ) 25 MG tablet TAKE 1/2 TO 1 TABLET BY MOUTH DAILY   donepezil  (ARICEPT ) 10 MG tablet Take 1 tablet (10 mg total) by mouth daily.   famotidine (PEPCID) 20 MG tablet Take 20 mg by mouth daily.   hydrocortisone  2.5 %  ointment Apply topically 2 (two) times daily. For 7-10 days for hemorrhoid tags- Can reuse in future but need at least 1 week break   KLOR-CON  M20 20 MEQ tablet TAKE 2 TABLETS BY MOUTH TWICE A DAY   latanoprost (XALATAN) 0.005 % ophthalmic solution Place 1 drop into both eyes at bedtime.   levothyroxine  (SYNTHROID ) 50 MCG tablet TAKE 1 TABLET BY MOUTH EVERY DAY   memantine  (NAMENDA ) 10 MG tablet Take 1 tablet twice a day   metoprolol  succinate (TOPROL -XL) 50 MG 24 hr tablet Take 1 tablet (50 mg total) by mouth daily. Take with or immediately following a meal.   Multiple Vitamin (MULTIVITAMIN) tablet Take 1 tablet by mouth daily.   pravastatin  (PRAVACHOL ) 40 MG tablet TAKE 1 TABLET BY MOUTH EVERY DAY   [DISCONTINUED] meloxicam  (MOBIC ) 15 MG tablet Take 1 tablet (15 mg total) by mouth daily.   [DISCONTINUED] metFORMIN  (GLUCOPHAGE ) 500 MG tablet TAKE 1 TABLET BY MOUTH EVERY DAY WITH BREAKFAST (Patient not taking: Reported on 03/06/2023)   No facility-administered encounter medications on file as of 03/21/2023.    Allergies (verified) Aspirin, Nsaids, Amoxicillin-pot clavulanate, and Sulfa antibiotics   History: Past Medical History:  Diagnosis Date   Arthritis    Basal cell carcinoma 05/28/2014   L nasal tip. Mohs Dr. Anniece.     Cataract    bilaterally removed   Diabetes mellitus  GERD (gastroesophageal reflux disease)    Glaucoma    Hyperlipidemia    Hypertension    Hypothyroidism    Pneumonia 04/12/2006   Prostate cancer (HCC)    Seasonal allergies    Sinusitis    treated for bacterial infection at least once a year at [facility]   Stomach ulcer 2010   bleeding ulcer result NSAIDS   Past Surgical History:  Procedure Laterality Date   abdominl abcess     03-15-2011   BACK SURGERY     CATARACT EXTRACTION, BILATERAL     04-07-2014, 03-17-2014   CYSTOURETHROSCOPY  11/11/1998   LUMBAR LAMINECTOMY  2007   [place]    MOHS SURGERY     left side of nose   POLYPECTOMY     PROSTATE  BIOPSY     radiation 2016   RECONSTRUCTION TENDON PULLEY W/ TENDON / FASCIAL GRAFT OF HAND / FINGER  1982   rt 3rd finger   RECONSTRUCTION TENDON PULLEY W/ TENDON / FASCIAL GRAFT OF HAND / FINGER  1953   rt 5th finger   UPPER GASTROINTESTINAL ENDOSCOPY     Family History  Problem Relation Age of Onset   Diabetes Mother    Diabetes Father    Hyperlipidemia Father    Colon polyps Father    Brain cancer Brother        smoker   Dementia Paternal Grandmother    Colon cancer Neg Hx    Esophageal cancer Neg Hx    Stomach cancer Neg Hx    Rectal cancer Neg Hx    Social History   Socioeconomic History   Marital status: Married    Spouse name: Not on file   Number of children: 2   Years of education: 4   Highest education level: Not on file  Occupational History   Occupation: retired  Tobacco Use   Smoking status: Former    Current packs/day: 0.00    Types: Cigarettes    Quit date: 02/03/2001    Years since quitting: 22.1   Smokeless tobacco: Never   Tobacco comments:    intermittent cigar use stopped  Vaping Use   Vaping status: Never Used  Substance and Sexual Activity   Alcohol use: Yes    Alcohol/week: 0.0 standard drinks of alcohol    Comment: occasional beer   Drug use: No   Sexual activity: Not Currently    Partners: Female  Other Topics Concern   Not on file  Social History Narrative   Married (wife patient outside practice). 2 children. 5 grandkids.       Lives in 1 story condo on the 3rd floor   Bachelor's degree   Right handed   Social Drivers of Health   Financial Resource Strain: Low Risk  (03/21/2023)   Overall Financial Resource Strain (CARDIA)    Difficulty of Paying Living Expenses: Not hard at all  Food Insecurity: No Food Insecurity (03/21/2023)   Hunger Vital Sign    Worried About Running Out of Food in the Last Year: Never true    Ran Out of Food in the Last Year: Never true  Transportation Needs: No Transportation Needs (03/21/2023)    PRAPARE - Administrator, Civil Service (Medical): No    Lack of Transportation (Non-Medical): No  Physical Activity: Inactive (03/21/2023)   Exercise Vital Sign    Days of Exercise per Week: 0 days    Minutes of Exercise per Session: 0 min  Stress: No Stress Concern Present (  03/21/2023)   [employer] of Occupational Health - Occupational Stress Questionnaire    Feeling of Stress : Not at all  Social Connections: Socially Isolated (03/21/2023)   Social Connection and Isolation Panel [NHANES]    Frequency of Communication with Friends and Family: Once a week    Frequency of Social Gatherings with Friends and Family: Never    Attends Religious Services: Never    Diplomatic Services Operational Officer: No    Attends Engineer, Structural: Never    Marital Status: Married    Tobacco Counseling Counseling given: Not Answered Tobacco comments: intermittent cigar use stopped   Clinical Intake:  Pre-visit preparation completed: Yes  Pain : 0-10 (wife stated hips ache him per his complaints) Pain Type: Chronic pain Pain Location: Hip Pain Orientation: Right Pain Descriptors / Indicators: Aching, Stabbing Pain Onset: More than a month ago Pain Frequency: Intermittent     BMI - recorded: 24.81 Nutritional Status: BMI of 19-24  Normal Nutritional Risks: None Diabetes: Yes CBG done?: No Did pt. bring in CBG monitor from home?: No  How often do you need to have someone help you when you read instructions, pamphlets, or other written materials from your doctor or pharmacy?: 1 - Never  Interpreter Needed?: No  Information entered by :: Ellouise Haws, LPN   Activities of Daily Living    03/21/2023   11:25 AM  In your present state of health, do you have any difficulty performing the following activities:  Hearing? 0  Vision? 0  Difficulty concentrating or making decisions? 1  Walking or climbing stairs? 1  Dressing or bathing? 1  Comment assistANCE   Doing errands, shopping? 1  Preparing Food and eating ? Y  Using the Toilet? Y  In the past six months, have you accidently leaked urine? Y  Comment wears a brief  Do you have problems with loss of bowel control? Y  Comment wears a brief  Managing your Medications? Y  Managing your Finances? Y  Comment wife assistance  Housekeeping or managing your Housekeeping? Y    Patient Care Team: Katrinka Garnette KIDD, MD as PCP - General (Family Medicine) Georjean Darice HERO, MD as Consulting Physician (Neurology) Loreda Hacker, DPM as Consulting Physician (Podiatry) Cleotilde Carlin Lenis, OD as Consulting Physician (Optometry) Dermatology, [facility] as Consulting Physician Dina, Sara E, PA-C (Neurology)  Indicate any recent Medical Services you may have received from other than [facility] providers in the past year (date may be approximate).     Assessment:   This is a routine wellness examination for Zeev.  Hearing/Vision screen Hearing Screening - Comments:: Pt denies any hearing issues  Vision Screening - Comments:: Pt follows up with Dr Cleotilde for annual eye exams    Goals Addressed             This Visit's Progress    Patient Stated       Encourage more mobility        Depression Screen    03/21/2023   11:33 AM 11/15/2022    4:11 PM 11/15/2022    4:06 PM 05/25/2022   11:13 AM 03/15/2022   11:21 AM 02/23/2021   11:09 AM 05/19/2020   10:57 AM  PHQ 2/9 Scores  PHQ - 2 Score 0 1 0 0 0 0 0  PHQ- 9 Score 0 11  10       Fall Risk    03/21/2023   11:35 AM 12/21/2022   11:55 AM 11/15/2022  4:06 PM 06/21/2022   11:12 AM 05/25/2022   11:13 AM  Fall Risk   Falls in the past year? 1 0 0 0 1  Number falls in past yr: 0 0 0 0 0  Injury with Fall? 1 0 0 0 0  Comment rug bruise      Risk for fall due to : Impaired balance/gait;Impaired mobility;History of fall(s)  No Fall Risks  History of fall(s)  Follow up Falls prevention discussed Falls evaluation completed Falls evaluation  completed Falls evaluation completed Falls evaluation completed    MEDICARE RISK AT HOME: Medicare Risk at Home Any stairs in or around the home?: No If so, are there any without handrails?: No Home free of loose throw rugs in walkways, pet beds, electrical cords, etc?: Yes Adequate lighting in your home to reduce risk of falls?: Yes Life alert?: Yes Use of a cane, walker or w/c?: Yes Grab bars in the bathroom?: Yes Shower chair or bench in shower?: Yes Elevated toilet seat or a handicapped toilet?: Yes  TIMED UP AND GO:  Was the test performed?  No    Cognitive Function:    12/21/2022    3:00 PM 06/21/2022    2:00 PM 12/17/2021   11:00 AM 06/16/2021   11:00 AM 06/08/2017    8:46 AM  MMSE - Mini Mental State Exam  Not completed:     Refused  Orientation to time 0 3 3 5    Orientation to Place 1 3 4 5    Registration 3 3 3 3    Attention/ Calculation 2 5 5 3    Recall 0 0 0 0   Language- name 2 objects 2 1 2 2    Language- repeat 1 1 1 1    Language- follow 3 step command 3 3 3 3    Language- read & follow direction 1 1 1 1    Write a sentence 1 1 1 1    Copy design 1 1 1  0   Total score 15 22 24 24        12/16/2020    1:00 PM 05/10/2018    8:00 AM 09/26/2017   10:00 AM 03/27/2017    9:00 AM  Montreal Cognitive Assessment   Visuospatial/ Executive (0/5) 1 2 4 4   Naming (0/3) 2 3 2 3   Attention: Read list of digits (0/2) 2 2 2 2   Attention: Read list of letters (0/1) 1 1 1 1   Attention: Serial 7 subtraction starting at 100 (0/3) 3 3 3 3   Language: Repeat phrase (0/2) 0 2 2 2   Language : Fluency (0/1) 1 1 1 1   Abstraction (0/2) 0 2 2 2   Delayed Recall (0/5) 0 0 0 0  Orientation (0/6) 2 6 6 6   Total 12 22 23 24   Adjusted Score (based on education) 12         03/21/2023   11:38 AM 03/15/2022   11:25 AM 02/23/2021   11:13 AM 02/20/2020    9:53 AM  6CIT Screen  What Year? 4 points 0 points 0 points   What month? 3 points 3 points 0 points 0 points  What time? 3 points 0  points 0 points   Count back from 20 0 points 0 points 0 points 0 points  Months in reverse 0 points 4 points 0 points 4 points  Repeat phrase 8 points 10 points 0 points 10 points  Total Score 18 points 17 points 0 points     Immunizations Immunization History  Administered  Date(s) Administered   Fluad Quad(high Dose 65+) 01/07/2019, 01/08/2020, 12/28/2021   Fluad Trivalent(High Dose 65+) 12/26/2022   Hepatitis B 06/19/2007, 11/16/2007, 04/18/2008   Hepatitis B, ADULT 06/19/2007, 11/16/2007, 04/18/2008   Hepatitis B, PED/ADOLESCENT 06/19/2007, 11/16/2007, 04/18/2008   Influenza Whole 01/27/2012   Influenza, High Dose Seasonal PF 01/27/2012, 12/21/2015, 12/20/2016, 12/01/2017   Influenza,inj,Quad PF,6+ Mos 02/24/2015   Influenza-Unspecified 01/27/2012, 01/21/2014, 12/31/2020   Meningococcal polysaccharide vaccine (MPSV4) 12/19/2008   PFIZER Comirnaty(Gray Top)Covid-19 Tri-Sucrose Vaccine 06/16/2020, 02/16/2022   PFIZER(Purple Top)SARS-COV-2 Vaccination 03/27/2019, 04/17/2019, 12/10/2019   Pfizer Covid-19 Vaccine Bivalent Booster 25yrs & up 12/01/2020   Pfizer(Comirnaty)Fall Seasonal Vaccine 12 years and older 03/06/2023   Pneumococcal Conjugate-13 08/25/2014   Pneumococcal Polysaccharide-23 06/19/2007, 12/19/2008, 12/21/2015   Respiratory Syncytial Virus Vaccine,Recomb Aduvanted(Arexvy) 02/23/2022   Td 04/23/2014   Tdap 03/07/2001   Zoster Recombinant(Shingrix) 05/19/2020, 07/21/2020   Zoster, Live 08/12/2008    TDAP status: Up to date  Flu Vaccine status: Up to date  Pneumococcal vaccine status: Up to date  Covid-19 vaccine status: Information provided on how to obtain vaccines.   Qualifies for Shingles Vaccine? Yes   Zostavax completed Yes   Shingrix Completed?: Yes  Screening Tests Health Maintenance  Topic Date Due   Diabetic kidney evaluation - Urine ACR  11/27/2022   COVID-19 Vaccine (8 - 2024-25 season) 05/01/2023   OPHTHALMOLOGY EXAM  07/04/2023   FOOT EXAM   08/18/2023   HEMOGLOBIN A1C  09/12/2023   Diabetic kidney evaluation - eGFR measurement  03/02/2024   Medicare Annual Wellness (AWV)  03/20/2024   DTaP/Tdap/Td (3 - Td or Tdap) 04/23/2024   Pneumonia Vaccine 51+ Years old  Completed   INFLUENZA VACCINE  Completed   Zoster Vaccines- Shingrix  Completed   HPV VACCINES  Aged Out   Colonoscopy  Discontinued    Health Maintenance  Health Maintenance Due  Topic Date Due   Diabetic kidney evaluation - Urine ACR  11/27/2022    Colorectal cancer screening: No longer required.    Additional Screening:   Vision Screening: Recommended annual ophthalmology exams for early detection of glaucoma and other disorders of the eye. Is the patient up to date with their annual eye exam?  Yes  Who is the provider or what is the name of the office in which the patient attends annual eye exams? [facility]  If pt is not established with a provider, would they like to be referred to a provider to establish care? No .   Dental Screening: Recommended annual dental exams for proper oral hygiene  Diabetic Foot Exam: Diabetic Foot Exam: Completed 08/18/22  Community Resource Referral / Chronic Care Management: CRR required this visit?  No   CCM required this visit?  No     Plan:     I have personally reviewed and noted the following in the patient's chart:   Medical and social history Use of alcohol, tobacco or illicit drugs  Current medications and supplements including opioid prescriptions. Patient is not currently taking opioid prescriptions. Functional ability and status Nutritional status Physical activity Advanced directives List of other physicians Hospitalizations, surgeries, and ER visits in previous 12 months Vitals Screenings to include cognitive, depression, and falls Referrals and appointments  In addition, I have reviewed and discussed with patient certain preventive protocols, quality metrics, and best practice  recommendations. A written personalized care plan for preventive services as well as general preventive health recommendations were provided to patient.     Ellouise VEAR Haws, LPN  03/21/2023   After Visit Summary: (In Person-Printed) AVS printed and given to the patient  Nurse Notes: Pt request any resources that can aid her in having someone sit with her husband at times to give her caregiver assistance relief. Pt cognition score is 18 at today's visit.

## 2023-03-22 ENCOUNTER — Telehealth: Payer: Self-pay | Admitting: *Deleted

## 2023-03-22 NOTE — Progress Notes (Signed)
Complex Care Management Note Care Guide Note  03/22/2023 Name: Eddie Ferguson. Skura MRN: [id number] DOB: 1940/06/11   Complex Care Management Outreach Attempts: An unsuccessful telephone outreach was attempted today to offer the patient information about available complex care management services.  Follow Up Plan:  Additional outreach attempts will be made to offer the patient complex care management information and services.   Encounter Outcome:  No Answer  Burman Nieves, CMA, Care Guide [facility]  [facility], [facility] Guide Direct Dial: [phone]  Fax: [phone] Website: [email or web address]

## 2023-03-23 NOTE — Progress Notes (Signed)
Complex Care Management Note Care Guide Note  03/23/2023 Name: Eddie Ferguson. [name] MRN: [id number] DOB: [birth year]   Complex Care Management Outreach Attempts: A second unsuccessful outreach was attempted today to offer the patient with information about available complex care management services.  Follow Up Plan:  Additional outreach attempts will be made to offer the patient complex care management information and services.   Encounter Outcome:  No Answer  Burman Nieves, CMA, Care Guide [facility]  [facility], [facility] Guide Direct Dial: [phone]  Fax: [phone] Website: [email or web address]

## 2023-03-24 NOTE — Progress Notes (Signed)
Complex Care Management Note Care Guide Note  03/24/2023 Name: Eddie Ferguson. [name] MRN: [id number] DOB: 11/22/1940   Complex Care Management Outreach Attempts: A third unsuccessful outreach was attempted today to offer the patient with information about available complex care management services.  Follow Up Plan:  No further outreach attempts will be made at this time. We have been unable to contact the patient to offer or enroll patient in complex care management services.  Encounter Outcome:  No Answer  Burman Nieves, CMA, Care Guide [facility]  [facility], [facility] Guide Direct Dial: [phone]  Fax: [phone] Website: [email or web address]

## 2023-03-26 ENCOUNTER — Other Ambulatory Visit: Payer: Self-pay | Admitting: Family Medicine

## 2023-03-29 ENCOUNTER — Telehealth: Payer: Self-pay | Admitting: *Deleted

## 2023-03-29 ENCOUNTER — Ambulatory Visit: Payer: Medicare HMO | Admitting: Family Medicine

## 2023-03-29 ENCOUNTER — Encounter: Payer: Self-pay | Admitting: Family Medicine

## 2023-03-29 VITALS — BP 108/70 | HR 128 | Temp 97.1°F | Ht 69.0 in | Wt 170.0 lb

## 2023-03-29 DIAGNOSIS — E785 Hyperlipidemia, unspecified: Secondary | ICD-10-CM | POA: Diagnosis not present

## 2023-03-29 DIAGNOSIS — I4892 Unspecified atrial flutter: Secondary | ICD-10-CM

## 2023-03-29 DIAGNOSIS — E1122 Type 2 diabetes mellitus with diabetic chronic kidney disease: Secondary | ICD-10-CM

## 2023-03-29 DIAGNOSIS — N1831 Chronic kidney disease, stage 3a: Secondary | ICD-10-CM | POA: Diagnosis not present

## 2023-03-29 DIAGNOSIS — I10 Essential (primary) hypertension: Secondary | ICD-10-CM | POA: Diagnosis not present

## 2023-03-29 DIAGNOSIS — I4891 Unspecified atrial fibrillation: Secondary | ICD-10-CM | POA: Insufficient documentation

## 2023-03-29 DIAGNOSIS — R000 Tachycardia, unspecified: Secondary | ICD-10-CM | POA: Diagnosis not present

## 2023-03-29 DIAGNOSIS — E039 Hypothyroidism, unspecified: Secondary | ICD-10-CM

## 2023-03-29 LAB — COMPREHENSIVE METABOLIC PANEL
ALT: 15 U/L (ref 0–53)
AST: 14 U/L (ref 0–37)
Albumin: 3.6 g/dL (ref 3.5–5.2)
Alkaline Phosphatase: 70 U/L (ref 39–117)
BUN: 17 mg/dL (ref 6–23)
CO2: 32 meq/L (ref 19–32)
Calcium: 9.6 mg/dL (ref 8.4–10.5)
Chloride: 97 meq/L (ref 96–112)
Creatinine, Ser: 1.01 mg/dL (ref 0.40–1.50)
GFR: 68.13 mL/min (ref >60.00)
Glucose, Bld: 262 mg/dL — ABNORMAL HIGH (ref 70–99)
Potassium: 4.1 meq/L (ref 3.5–5.1)
Sodium: 139 meq/L (ref 135–145)
Total Bilirubin: 0.6 mg/dL (ref 0.2–1.2)
Total Protein: 6 g/dL (ref 6.0–8.3)

## 2023-03-29 LAB — CBC WITH DIFFERENTIAL/PLATELET
Basophils Absolute: 0.1 10*3/uL (ref 0.0–0.1)
Basophils Relative: 0.4 % (ref 0.0–3.0)
Eosinophils Absolute: 0 10*3/uL (ref 0.0–0.7)
Eosinophils Relative: 0.2 % (ref 0.0–5.0)
HCT: 40.8 % (ref 39.0–52.0)
Hemoglobin: 13.4 g/dL (ref 13.0–17.0)
Lymphocytes Relative: 5.5 % — ABNORMAL LOW (ref 12.0–46.0)
Lymphs Abs: 0.7 10*3/uL (ref 0.7–4.0)
MCHC: 32.9 g/dL (ref 30.0–36.0)
MCV: 90.7 fL (ref 78.0–100.0)
Monocytes Absolute: 1 10*3/uL (ref 0.1–1.0)
Monocytes Relative: 8.1 % (ref 3.0–12.0)
Neutro Abs: 10.8 10*3/uL — ABNORMAL HIGH (ref 1.4–7.7)
Neutrophils Relative %: 85.8 % — ABNORMAL HIGH (ref 43.0–77.0)
Platelets: 333 10*3/uL (ref 150.0–400.0)
RBC: 4.5 Mil/uL (ref 4.22–5.81)
RDW: 15.7 % — ABNORMAL HIGH (ref 11.5–15.5)
WBC: 12.6 10*3/uL — ABNORMAL HIGH (ref 4.0–10.5)

## 2023-03-29 LAB — LIPID PANEL
Cholesterol: 121 mg/dL (ref 0–200)
HDL: 48.9 mg/dL (ref >39.00)
LDL Cholesterol: 57 mg/dL (ref 0–99)
NonHDL: 72.29
Total CHOL/HDL Ratio: 2
Triglycerides: 74 mg/dL (ref 0.0–149.0)
VLDL: 14.8 mg/dL (ref 0.0–40.0)

## 2023-03-29 LAB — TSH: TSH: 2.39 u[IU]/mL (ref 0.35–5.50)

## 2023-03-29 MED ORDER — METOPROLOL SUCCINATE ER 100 MG PO TB24: 100.0000 mg | ORAL_TABLET | Freq: Every day | ORAL | 3 refills | Status: AC

## 2023-03-29 MED ORDER — APIXABAN 5 MG PO TABS: 5.0000 mg | ORAL_TABLET | Freq: Two times a day (BID) | ORAL | 5 refills | Status: DC

## 2023-03-29 NOTE — Patient Instructions (Addendum)
Please stop by lab before you go If you have mychart- we will send your results within 3 business days of [place] receiving them.  If you do not have mychart- we will call you about results within 5 business days of [place] receiving them.  *please also note that you will see labs on mychart as soon as they post. I will later go in and write notes on them- will say "notes from Dr. Durene Cal"   This appears to be new atrial flutter or fibrillation- we are waiting on cardiology for confirmation. Increase metoprolol to 100 mg XR - can take 2 of what you have. Start eliquis 5 mg twice daily to reduce risk of stroke.   I am trying to see if Dr. Antoine Poche wants you to see him directly or a fib clinic  Lets hold steady off metformin- part of me wonders if how his stomach has been doing could be related to these heart issues if heart rate is going up high and making him feel poorly  Team please get him set up with urgent a fib referral  Recommended follow up: Return in about 1 month (around 04/29/2023) for followup or sooner if needed.Schedule b4 you leave.

## 2023-03-29 NOTE — Progress Notes (Signed)
Complex Care Management Note  Care Guide Note 03/29/2023 Name: Copelin Cheuk. Dankers MRN: [id number] DOB: October 05, 1940  [name]. Blocker is a 83 y.o. year old male who sees Hunter, Aldine Contes, MD for primary care. I reached out to [employer] [place] by phone today to offer complex care management services.  Mr. Pase was given information about Complex Care Management services today including:   The Complex Care Management services include support from the care team which includes your Nurse Coordinator, Clinical Social Worker, or Pharmacist.  The Complex Care Management team is here to help remove barriers to the health concerns and goals most important to you. Complex Care Management services are voluntary, and the patient may decline or stop services at any time by request to their care team member.   Complex Care Management Consent Status: Patient agreed to services and verbal consent obtained.   Follow up plan:  Telephone appointment with complex care management team member scheduled for:  03/31/2023  Encounter Outcome:  Patient Scheduled  Burman Nieves, CMA, Care Guide [facility]  [facility], [facility] Guide Direct Dial: [phone]  Fax: [phone] Website: [email or web address]

## 2023-03-29 NOTE — Progress Notes (Signed)
Phone [phone] In person visit   Subjective:   Eddie Ferguson. Eddie Ferguson is a 83 y.o. year old very pleasant male patient who presents for/with See problem oriented charting Chief Complaint  Patient presents with   Follow-up    Pt here for 6 month f.u to update medications    Diabetes   Hyperlipidemia   Hypertension   Past Medical History-  Patient Active Problem List   Diagnosis Date Noted   Atrial flutter with rapid ventricular response (HCC) 03/29/2023    Priority: High   Dementia due to Alzheimer's disease (HCC) 06/16/2015    Priority: High   History of prostate cancer-follows with [facility]. [facility] 05/28/2014    Priority: High   Type 2 diabetes mellitus with diabetic chronic kidney disease (HCC) 04/23/2014    Priority: High   Aortic atherosclerosis (HCC) 01/05/2018    Priority: Medium    CKD (chronic kidney disease), stage III (HCC) 04/18/2017    Priority: Medium    Gout 07/09/2015    Priority: Medium    Essential hypertension 04/23/2014    Priority: Medium    Hyperlipidemia 04/23/2014    Priority: Medium    Hypothyroidism 04/23/2014    Priority: Medium    Adenomatous colon polyp 05/28/2014    Priority: Low   Osteoarthritis 05/28/2014    Priority: Low   Erectile dysfunction 05/28/2014    Priority: Low   Basal cell carcinoma 05/28/2014    Priority: Low   GERD (gastroesophageal reflux disease) 04/23/2014    Priority: Low   Allergic rhinitis 04/23/2014    Priority: Low   Glaucoma 04/23/2014    Priority: Low   History of nonmelanoma skin cancer 11/18/2012    Priority: Low   Pain due to onychomycosis of toenails of both feet 08/17/2020   Porokeratosis 08/17/2020   Positive FIT (fecal immunochemical test) 06/17/2020   Intermittent diarrhea 06/17/2020   History of shingles 04/24/2011    Medications- reviewed and updated Current Outpatient Medications  Medication Sig Dispense Refill   acetaminophen (TYLENOL) 500 MG tablet Take 500 mg by mouth  every 6 (six) hours as needed. Reported on 06/16/2015     allopurinol (ZYLOPRIM) 100 MG tablet TAKE 1 TABLET BY MOUTH TWICE A DAY 180 tablet 3   apixaban (ELIQUIS) 5 MG TABS tablet Take 1 tablet (5 mg total) by mouth 2 (two) times daily. 60 tablet 5   benazepril (LOTENSIN) 40 MG tablet TAKE 1 TABLET BY MOUTH EVERY DAY 90 tablet 3   chlorthalidone (HYGROTON) 25 MG tablet TAKE 1/2 TO 1 TABLET BY MOUTH DAILY 90 tablet 1   donepezil (ARICEPT) 10 MG tablet Take 1 tablet (10 mg total) by mouth daily. 30 tablet 11   famotidine (PEPCID) 20 MG tablet Take 20 mg by mouth daily.     KLOR-CON M20 20 MEQ tablet TAKE 2 TABLETS BY MOUTH TWICE A DAY 360 tablet 2   latanoprost (XALATAN) 0.005 % ophthalmic solution Place 1 drop into both eyes at bedtime.     levothyroxine (SYNTHROID) 50 MCG tablet TAKE 1 TABLET BY MOUTH EVERY DAY 90 tablet 3   memantine (NAMENDA) 10 MG tablet Take 1 tablet twice a day 180 tablet 3   Multiple Vitamin (MULTIVITAMIN) tablet Take 1 tablet by mouth daily.     pravastatin (PRAVACHOL) 40 MG tablet TAKE 1 TABLET BY MOUTH EVERY DAY 90 tablet 3   hydrocortisone 2.5 % ointment Apply topically 2 (two) times daily. For 7-10 days for hemorrhoid tags- Can reuse in  future but need at least 1 week break (Patient not taking: Reported on 03/29/2023) 30 g 2   metoprolol succinate (TOPROL-XL) 100 MG 24 hr tablet Take 1 tablet (100 mg total) by mouth daily. Take with or immediately following a meal. 189 tablet 3   No current facility-administered medications for this visit.     Objective:  BP 108/70   Pulse (!) 128   Temp (!) 97.1 F (36.2 C)   Ht 5\' 9"  (1.753 m)   Wt 170 lb (77.1 kg)   SpO2 98%   BMI 25.10 kg/m  Gen: NAD, resting comfortably on the table CV: Irregular irregular and rapid with rate well over 100 Lungs: CTAB no crackles, wheeze, rhonchi Abdomen: soft/nontender/nondistended/normal bowel sounds. No rebound or guarding.  Ext: trace edema Skin: warm, dry   EKG: Rapid rhythm  and irregular most closely resembling atrial flutter from my perspective  rate 134 and occasional PVCs, left axis, QRS and QT interval are normal, no obvious hypertrophy, no significant st or t wave changes.  Compared to EKG 01/19/2023-atrial flutter appears new-most recently noted as atrial tachycardia     Assessment and Plan   # Leukocytosis with mild anemia-noted 3 weeks ago and 1 month ago on labs-referral to hematology was placed and is pending  # Tachycardia S: Initial heart rate elevated at 128.  Recent notes from cardiology show "There are runs of SVT.  I don't know if this is related to the symptoms that he is describing as they are vague.  However,   I would like to stop his Norvasc and start [name] 50 mg PO daily .  Disp number 90 with 3 refills.  Schedule follow up in two months with APP.  Call Mr. Tumolo with the results and send results to [name], MD" -This message was from 02/12/2023 and cardiology team left a message  -he is on the metoprolol 50 mg XR  -2 days ago reported "not feeling well" but couldn't describe- sleeping more- but no chest pain or shortness of breath or palpitations reported  A/P: This appears to be atrial flutter to me with RVR.  We will increase metoprolol to 100 mg extended release and start him on Eliquis 5 mg twice daily and referred to atrial fibrillation clinic-education on the reasons for these changing including stroke prevention and reducing cardiac strain were discussed   # Diabetes S: Medication:metformin 500mg  started 11/29/21- fatigue in past on this but we have held recently due to GI issues- has improved some but not all the way- belching fair amount, eating far less in last 3 months- part of that is sleeping in late Lab Results  Component Value Date   HGBA1C 7.4 (H) 03/15/2023   HGBA1C 7.1 (H) 09/26/2022   HGBA1C 6.9 (H) 05/25/2022   A/P: A1c is above goal and that was while he was on metformin-metformin has been stopped to  see if we could get some improvement in GI symptoms and have noted some.  I would like to see how he does with cardiac evaluation before making any further changes.  Did consider medication like Jardiance my concern is he may not report UTI symptoms if they occur  #hypertension #CKD III- GFR typically 1.2-1.4 and most recently  S: medication: Benazepril 40 mg, chlorthalidone 25 mg, Does need potassium with chlorthalidone, amlodipine 5 mg changedto metoprolol 50 mg ER due to SVT and cardiac monitoring  BP Readings from Last 3 Encounters:  03/29/23 108/70  03/21/23 130/70  02/27/23 110/60  A/P: Blood pressure is well-controlled-I think he can tolerate the slight increase in metoprolol -In regards to GFR-last creatinine was actually 1.05 with GFR just above 60.  We will recheck today-needed potassium repleted recently  #hyperlipidemia with aortic atherosclerosis -prior stroke on imaging S: Medication:Pravastatin 40 mg Lab Results  Component Value Date   CHOL 133 05/25/2022   HDL 57.80 05/25/2022   LDLCALC 60 05/25/2022   LDLDIRECT 90.0 09/28/2016   TRIG 74.0 05/25/2022   CHOLHDL 2 05/25/2022    A/P: At goal last year-since updating labs we opted to check this as well  #hypothyroidism S: compliant On thyroid medication-levothyroxine 50 mcg  Lab Results  Component Value Date   TSH 1.03 09/26/2022  A/P: Hopefuly stable or improved- update TSH with labs today. Continue current meds for now.  Recommended follow up: Return in about 1 month (around 04/29/2023) for followup or sooner if needed.Schedule b4 you leave. Future Appointments  Date Time Provider Department Center  03/30/2023  1:30 PM Madelyn Brunner, DO [facility] None  04/20/2023  3:35 PM Louanne Skye Devoria Albe., NP [facility]  04/21/2023  2:00 PM [facility]  05/01/2023  3:00 PM [name], MD [facility]  05/16/2023  3:15 PM Helane Gunther, DPM [facility]  06/27/2023  1:00 PM Gwynneth Munson, Sung Amabile,  PA-C [facility] None  03/25/2024  2:20 PM [facility] ANNUAL WELLNESS VISIT 1 [facility]    Lab/Order associations:   ICD-10-CM   1. Atrial flutter with rapid ventricular response (HCC)  I48.92 Amb Referral to AFIB Clinic    2. Type 2 diabetes mellitus with stage 3a chronic kidney disease, without long-term current use of insulin (HCC) Chronic E11.22 Comprehensive metabolic panel   Y86.57 CBC with Differential/Platelet    Microalbumin / creatinine urine ratio    3. Hypothyroidism, unspecified type  E03.9 TSH    4. Essential hypertension  I10     5. Hyperlipidemia, unspecified hyperlipidemia type  E78.5 Lipid panel    6. Tachycardia  R00.0 EKG 12-Lead      Meds ordered this encounter  Medications   metoprolol succinate (TOPROL-XL) 100 MG 24 hr tablet    Sig: Take 1 tablet (100 mg total) by mouth daily. Take with or immediately following a meal.    Dispense:  189 tablet    Refill:  3   apixaban (ELIQUIS) 5 MG TABS tablet    Sig: Take 1 tablet (5 mg total) by mouth 2 (two) times daily.    Dispense:  60 tablet    Refill:  5     Time Spent: 45 minutes of total time (11:25 AM-12:02 PM, 1:13 PM-1:23 PM but 2 minutes of this 47 minutes was spent on EKG interpretation so not including) was spent on the date of the encounter performing the following actions: chart review prior to seeing the patient, obtaining history, performing a medically necessary exam, counseling on the diagnosis of atrial flutter as well as its risks and discussing the treatment plan, discussing plan through epic secure messaging with cardiology Dr. Antoine Poche, placing orders, and documenting in our EHR.  As noted a separate 2 minutes were spent on EKG interpretation  Return precautions advised.  Tana Conch, MD

## 2023-03-29 NOTE — Assessment & Plan Note (Signed)
#   Tachycardia S: Initial heart rate elevated at 128.  Recent notes from cardiology show "There are runs of SVT.  I don't know if this is related to the symptoms that he is describing as they are vague.  However,   I would like to stop his Norvasc and start [name] 50 mg PO daily .  Disp number 90 with 3 refills.  Schedule follow up in two months with APP.  Call Eddie Ferguson with the results and send results to [name], MD" -This message was from 02/12/2023 and cardiology team left a message  -he is on the metoprolol 50 mg XR  -2 days ago reported "not feeling well" but couldn't describe- sleeping more- but no chest pain or shortness of breath or palpitations reported  A/P: This appears to be atrial flutter to me with RVR.  We will increase metoprolol to 100 mg extended release and start him on Eliquis 5 mg twice daily and referred to atrial fibrillation clinic-education on the reasons for these changing including stroke prevention and reducing cardiac strain were discussed

## 2023-03-30 ENCOUNTER — Encounter: Payer: Self-pay | Admitting: Family Medicine

## 2023-03-30 ENCOUNTER — Encounter: Payer: Self-pay | Admitting: Sports Medicine

## 2023-03-30 ENCOUNTER — Ambulatory Visit: Payer: Medicare HMO | Admitting: Sports Medicine

## 2023-03-30 ENCOUNTER — Other Ambulatory Visit: Payer: Self-pay

## 2023-03-30 DIAGNOSIS — M1611 Unilateral primary osteoarthritis, right hip: Secondary | ICD-10-CM | POA: Diagnosis not present

## 2023-03-30 DIAGNOSIS — M25551 Pain in right hip: Secondary | ICD-10-CM | POA: Diagnosis not present

## 2023-03-30 DIAGNOSIS — G8929 Other chronic pain: Secondary | ICD-10-CM

## 2023-03-30 LAB — MICROALBUMIN / CREATININE URINE RATIO
Creatinine,U: 222.1 mg/dL
Microalb Creat Ratio: 0.6 mg/g (ref 0.0–30.0)
Microalb, Ur: 1.4 mg/dL (ref 0.0–1.9)

## 2023-03-30 MED ORDER — BETAMETHASONE SOD PHOS & ACET 6 (3-3) MG/ML IJ SUSP
6.0000 mg | INTRAMUSCULAR | Status: AC | PRN
  Administered 2023-03-30: 6 mg via INTRA_ARTICULAR

## 2023-03-30 MED ORDER — BUPIVACAINE HCL 0.25 % IJ SOLN
2.0000 mL | INTRAMUSCULAR | Status: AC | PRN
  Administered 2023-03-30: 2 mL via INTRA_ARTICULAR

## 2023-03-30 MED ORDER — LIDOCAINE HCL 1 % IJ SOLN
2.0000 mL | INTRAMUSCULAR | Status: AC | PRN
  Administered 2023-03-30: 2 mL

## 2023-03-30 NOTE — Progress Notes (Signed)
Eddie Ferguson. [name] - 83 y.o. male MRN [id number]  Date of birth: 09-13-1940  Office Visit Note: Visit Date: 03/30/2023 PCP: [name], MD Referred by: [name], MD  Subjective: Chief Complaint  Patient presents with   Right Hip - Pain   HPI: Eddie Duren. Ferguson is a pleasant 83 y.o. male who presents today for  follow-up of right hip pain with OA.  He is here with his wife, Eddie Ferguson, who does provide majority of HPI given the patient's advanced dementia.  Eddie Ferguson has severe end-stage arthritis about the hip, but he has in the past and currently has some pain over the greater trochanter and laterally as well.  About 3 weeks ago we did proceed with an ultrasound-guided right intra-articular hip injection.  He was unable to take the meloxicam given the an allergy to this medication. Checked with primary/Cards.  Pertinent ROS were reviewed with the patient and found to be negative unless otherwise specified above in HPI.   Assessment & Plan: Visit Diagnoses:  1. Chronic right hip pain   2. Unilateral primary osteoarthritis, right hip   3. Greater trochanteric pain syndrome of right lower extremity    Plan: Impression is chronic right hip pain with advanced arthritis but also a degree of greater trochanteric pain syndrome over the lateral hip.  He is experiencing both cognitive and physical decline given his advanced Alzheimer's dementia which is contributing.  Irregardless of this he is having pain emanating from the right hip, through shared decision making, we could proceed with ultrasound-guided right greater trochanteric injection, patient tolerated well.  He will avoid meloxicam and any NSAIDs given his previous allergy with history of gastric ulcers.  He will follow-up with me in about 2 to 3 months for reevaluation of the right hip.  *His wife, Eddie Ferguson, is meeting with staff from [facility] to discuss physical aid or additional home aid, which I think is a good idea given his  limited ambulation and diminished physical activity. She has done an excellent job caring for him - I did commend her on this today.  Follow-up: Return in about 10 weeks (around 06/08/2023).   Meds & Orders: No orders of the defined types were placed in this encounter.   Orders Placed This Encounter  Procedures   US Guided Needle Placement - No Linked Charges     Procedures: Large Joint Inj: R greater trochanter on 03/30/2023 4:00 PM Indications: pain Details: 22 G 3.5 in needle, ultrasound-guided lateral approach Medications: 2 mL lidocaine 1 %; 2 mL bupivacaine 0.25 %; 6 mg betamethasone acetate-betamethasone sodium phosphate 6 (3-3) MG/ML Outcome: tolerated well, no immediate complications  US-Guided Greater Trochanteric Bursa Injection, Right After discussion on risks/benefits/indications and informed verbal consent was obtained, a timeout was performed. The patient was lying in lateral recumbent position on exam table. Using ultrasound guidance, the greater trochanter was identified. The area overlying the trochanteric bursa was then prepped with Betadine and alcohol swabs. Following sterile precautions, ultrasound was reapplied to visualize needle guidance with a 22-gauge 3.5" needle utilizing an in-plane approach to inject the bursa with 2:2:1 lidocaine:bupivicaine:betamethasone. Delivery of the injectate was visualized into the region of hypoechoic fluid of the greater trochanteric bursa. Patient tolerated procedure well without immediate complications.    Procedure, treatment alternatives, risks and benefits explained, specific risks discussed. Consent was given by the patient. Immediately prior to procedure a time out was called to verify the correct patient, procedure, equipment, support staff and site/side marked as  required. Patient was prepped and draped in the usual sterile fashion.          Clinical History: No specialty comments available.  He reports that he quit smoking  about 22 years ago. His smoking use included cigarettes. He has never used smokeless tobacco.  Recent Labs    05/25/22 1048 09/26/22 1609 03/15/23 1424  HGBA1C 6.9* 7.1* 7.4*  LABURIC 5.4  --   --     Objective:    Physical Exam  Gen: Well-appearing, in no acute distress; non-toxic CV: Well-perfused. Warm.  Resp: Breathing unlabored on room air; no wheezing. Psych: Fluid speech in conversation; appropriate affect; normal thought process  Ortho Exam - Right hip: His no redness swelling or effusion.  There is mild TTP over the lateral aspect of the greater trochanter but no effusion.  There is limited mobility about the right hip in all directions.  The patient is essentially wheelchair-bound and is unable to ambulate on his own at the office today. *His wife states he does use a rolling walker at home at times.  Imaging: No results found.  Past Medical/Family/Surgical/Social History: Medications & Allergies reviewed per EMR, new medications updated. Patient Active Problem List   Diagnosis Date Noted   Atrial flutter with rapid ventricular response (HCC) 03/29/2023   Pain due to onychomycosis of toenails of both feet 08/17/2020   Porokeratosis 08/17/2020   Positive FIT (fecal immunochemical test) 06/17/2020   Intermittent diarrhea 06/17/2020   Aortic atherosclerosis (HCC) 01/05/2018   CKD (chronic kidney disease), stage III (HCC) 04/18/2017   Gout 07/09/2015   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 [facility]le [facility] 05/28/2014   Osteoarthritis 05/28/2014   Erectile dysfunction 05/28/2014   Basal cell carcinoma 05/28/2014   Essential hypertension 04/23/2014   Hyperlipidemia 04/23/2014   GERD (gastroesophageal reflux disease) 04/23/2014   Type 2 diabetes mellitus with diabetic chronic kidney disease (HCC) 04/23/2014   Hypothyroidism 04/23/2014   Allergic rhinitis  04/23/2014   Glaucoma 04/23/2014   History of nonmelanoma skin cancer 11/18/2012   History of shingles 04/24/2011   Past Medical History:  Diagnosis Date   Arthritis    Basal cell carcinoma 05/28/2014   L nasal tip. Mohs Dr. Park Liter.     Cataract    bilaterally removed   Diabetes mellitus    GERD (gastroesophageal reflux disease)    Glaucoma    Hyperlipidemia    Hypertension    Hypothyroidism    Pneumonia 04/12/2006   Prostate cancer (HCC)    Seasonal allergies    Sinusitis    treated for bacterial infection at least once a year at [facility]   Stomach ulcer 2010   bleeding ulcer result NSAIDS   Family History  Problem Relation Age of Onset   Diabetes Mother    Diabetes Father    Hyperlipidemia Father    Colon polyps Father    Brain cancer Brother        smoker   Dementia Paternal Grandmother    Colon cancer Neg Hx    Esophageal cancer Neg Hx    Stomach cancer Neg Hx    Rectal cancer Neg Hx    Past Surgical History:  Procedure Laterality Date   abdominl abcess     03-15-2011   BACK SURGERY     CATARACT EXTRACTION, BILATERAL     04-07-2014, 03-17-2014   CYSTOURETHROSCOPY  11/11/1998   LUMBAR LAMINECTOMY  2007   [place]   MOHS SURGERY     left side of nose   POLYPECTOMY     PROSTATE BIOPSY     radiation 2016   RECONSTRUCTION TENDON PULLEY W/ TENDON / FASCIAL GRAFT OF HAND / FINGER  1982   rt 3rd finger   RECONSTRUCTION TENDON PULLEY W/ TENDON / FASCIAL GRAFT OF HAND / FINGER  1953   rt 5th finger   UPPER GASTROINTESTINAL ENDOSCOPY     Social History   Occupational History   Occupation: retired  Tobacco Use   Smoking status: Former    Current packs/day: 0.00    Types: Cigarettes    Quit date: 02/03/2001    Years since quitting: 22.1   Smokeless tobacco: Never   Tobacco comments:    intermittent cigar use stopped  Vaping Use   Vaping status: Never Used  Substance and Sexual Activity   Alcohol use: Yes    Alcohol/week: 0.0 standard drinks of alcohol     Comment: occasional beer   Drug use: No   Sexual activity: Not Currently    Partners: Female

## 2023-03-31 ENCOUNTER — Ambulatory Visit: Payer: Self-pay | Admitting: Licensed Clinical Social Worker

## 2023-03-31 NOTE — Patient Instructions (Signed)
Visit Information  Thank you for taking time to visit with me today. Please don't hesitate to contact me if I can be of assistance to you.   Following are the goals we discussed today:   Goals Addressed             This Visit's Progress    COMPLETED: Increase access to community resources.       Care Coordination Interventions: Assessed Social Determinants of Health Reviewed all upcoming appointments in Epic system Active listening / Reflection utilized  Emotional Support Provided Problem Solving /Task Center strategies reviewed Contact senior resources for possible resources for private sitters Contact private agencies to assist with in home aide services.         Please call the care guide team at [phone] if you need to cancel or reschedule your appointment.   If you are experiencing a Mental Health or Behavioral Health Crisis or need someone to talk to, please call the Suicide and Crisis Lifeline: 988  Patient verbalizes understanding of instructions and care plan provided today and agrees to view in MyChart. Active MyChart status and patient understanding of how to access instructions and care plan via MyChart confirmed with patient.     No further follow up required: Please reach out if you would like to reconnect with care coordinator.

## 2023-03-31 NOTE — Patient Outreach (Signed)
  Care Coordination   Initial Visit Note   03/31/2023 Name: Eddie Ferguson. Atteberry MRN: [id number] DOB: 12/08/1940  Eddie Ferguson. Eddie Ferguson is a 83 y.o. year old male who sees Hunter, Aldine Contes, MD for primary care. I spoke with  patient's spouse today Laurin Coder by phone today.  What matters to the patients health and wellness today?  Increasing access to community resources.    Goals Addressed             This Visit's Progress    COMPLETED: Increase access to community resources.       Care Coordination Interventions: Assessed Social Determinants of Health Reviewed all upcoming appointments in Epic system Active listening / Reflection utilized  Emotional Support Provided Problem Solving /Task Center strategies reviewed Contact senior resources for possible resources for private sitters Contact private agencies to assist with in home aide services.        SDOH assessments and interventions completed:  Yes  SDOH Interventions Today    Flowsheet Row Most Recent Value  SDOH Interventions   Food Insecurity Interventions Intervention Not Indicated  Transportation Interventions Intervention Not Indicated  Utilities Interventions Intervention Not Indicated        Care Coordination Interventions:  Yes, provided   Interventions Today    Flowsheet Row Most Recent Value  Chronic Disease   Chronic disease during today's visit Other  [Dementia]  General Interventions   General Interventions Discussed/Reviewed [employer], General Interventions Discussed  [CSW and spouse reviewed community resources regarding pt care. Spouse already contacted pt's insurance and they do not provide any care services. Discussed private sitter options/senior resources. Spouse agreed to reach out to family for support as well]  Education Interventions   Education Provided Provided Education  [CSW and spouse discussed care giver stress and CSW worked to normalize spouses feelings. Emotional support  provided. Spouse is reaching out to family for support and they have been stepping in more.]        Follow up plan: No further intervention required.   Encounter Outcome:  Patient Visit Completed   [name], LCSW [facility], [facility] Licensed Clinical Social Worker Care Coordinator [phone]

## 2023-04-03 ENCOUNTER — Ambulatory Visit (HOSPITAL_COMMUNITY)
Admission: RE | Admit: 2023-04-03 | Discharge: 2023-04-03 | Disposition: A | Discharge status: Home / Self Care | Payer: Medicare HMO | Source: Ambulatory Visit | Attending: Internal Medicine | Admitting: Internal Medicine

## 2023-04-03 ENCOUNTER — Encounter (HOSPITAL_COMMUNITY): Payer: Self-pay | Admitting: Internal Medicine

## 2023-04-03 VITALS — BP 146/78 | HR 72 | Ht 69.0 in | Wt 168.0 lb

## 2023-04-03 DIAGNOSIS — I4891 Unspecified atrial fibrillation: Secondary | ICD-10-CM

## 2023-04-03 DIAGNOSIS — Z7901 Long term (current) use of anticoagulants: Secondary | ICD-10-CM | POA: Insufficient documentation

## 2023-04-03 DIAGNOSIS — I110 Hypertensive heart disease with heart failure: Secondary | ICD-10-CM | POA: Insufficient documentation

## 2023-04-03 DIAGNOSIS — I498 Other specified cardiac arrhythmias: Secondary | ICD-10-CM | POA: Insufficient documentation

## 2023-04-03 DIAGNOSIS — E119 Type 2 diabetes mellitus without complications: Secondary | ICD-10-CM | POA: Insufficient documentation

## 2023-04-03 DIAGNOSIS — D6869 Other thrombophilia: Secondary | ICD-10-CM

## 2023-04-03 DIAGNOSIS — I480 Paroxysmal atrial fibrillation: Secondary | ICD-10-CM | POA: Diagnosis not present

## 2023-04-03 NOTE — Patient Instructions (Signed)
[employer] device for rhythm monitoring

## 2023-04-03 NOTE — Progress Notes (Addendum)
Primary Care Physician: [name], MD Primary Cardiologist: None Electrophysiologist: None     Referring Physician: Dr. Sandra Cockayne Eddie Ferguson is a 83 y.o. male with a history of HTN, dementia, T2DM, and paroxysmal atrial fibrillation who presents for consultation in the [facility]. Seen by Dr. Antoine Poche on 01/19/23 with what appeared to be an atrial tachycardia. Cardiac monitor worn 01/2023 showed brief SVT; patient placed on Toprol 50 mg daily. Review of noted show that patient was seen by PCP on 03/29/23 for routine follow up. He was noted to have an irregular tachycardic rate/rhythm on physical exam with HR 128. An ECG was performed which appears to be consistent with new onset atrial flutter with RVR; difficult to confirm due to significant baseline artifact. Review of notes show PCP increased Toprol to 100 mg daily and started on Eliquis. Patient is on Eliquis 5 mg BID for a CHADS2VASC score of 4.  On evaluation today, he is currently in NSR. I spoke with patient's wife today as historian due to patient's dementia. Patient's wife has noted they have been compliant with his metoprolol and Eliquis twice daily. Patient's wife noted he does not appear to have cardiac awareness and she has not noticed any symptoms.   Today, he denies symptoms of palpitations, chest pain, shortness of breath, orthopnea, PND, lower extremity edema, dizziness, presyncope, syncope, snoring, daytime somnolence, bleeding, or neurologic sequela. The patient is tolerating medications without difficulties and is otherwise without complaint today.   he has a BMI of Body mass index is 24.81 kg/m.Marland Kitchen Filed Weights   04/03/23 1109  Weight: 76.2 kg    Current Outpatient Medications  Medication Sig Dispense Refill   acetaminophen (TYLENOL) 500 MG tablet Take 500 mg by mouth every 6 (six) hours as needed. Reported on 06/16/2015     allopurinol (ZYLOPRIM) 100 MG tablet TAKE 1  TABLET BY MOUTH TWICE A DAY 180 tablet 3   apixaban (ELIQUIS) 5 MG TABS tablet Take 1 tablet (5 mg total) by mouth 2 (two) times daily. 60 tablet 5   benazepril (LOTENSIN) 40 MG tablet TAKE 1 TABLET BY MOUTH EVERY DAY 90 tablet 3   chlorthalidone (HYGROTON) 25 MG tablet TAKE 1/2 TO 1 TABLET BY MOUTH DAILY 90 tablet 1   donepezil (ARICEPT) 10 MG tablet Take 1 tablet (10 mg total) by mouth daily. 30 tablet 11   famotidine (PEPCID) 20 MG tablet Take 20 mg by mouth daily.     hydrocortisone 2.5 % ointment Apply topically 2 (two) times daily. For 7-10 days for hemorrhoid tags- Can reuse in future but need at least 1 week break 30 g 2   KLOR-CON M20 20 MEQ tablet TAKE 2 TABLETS BY MOUTH TWICE A DAY 360 tablet 2   latanoprost (XALATAN) 0.005 % ophthalmic solution Place 1 drop into both eyes at bedtime.     levothyroxine (SYNTHROID) 50 MCG tablet TAKE 1 TABLET BY MOUTH EVERY DAY 90 tablet 3   memantine (NAMENDA) 10 MG tablet Take 1 tablet twice a day 180 tablet 3   metoprolol succinate (TOPROL-XL) 100 MG 24 hr tablet Take 1 tablet (100 mg total) by mouth daily. Take with or immediately following a meal. 189 tablet 3   Multiple Vitamin (MULTIVITAMIN) tablet Take 1 tablet by mouth daily.     pravastatin (PRAVACHOL) 40 MG tablet TAKE 1 TABLET BY MOUTH EVERY DAY 90 tablet 3   No current facility-administered medications for this encounter.  Atrial Fibrillation Management history:  Previous antiarrhythmic drugs: none Previous cardioversions: none Previous ablations: none Anticoagulation history: Eliquis   ROS- All systems are reviewed and negative except as per the HPI above.  Physical Exam: BP (!) 146/78   Pulse 72   Ht 5\' 9"  (1.753 m)   Wt 76.2 kg   BMI 24.81 kg/m   GEN: Well nourished, well developed in no acute distress NECK: No JVD; No carotid bruits CARDIAC: Regular rate and rhythm, no murmurs, rubs, gallops RESPIRATORY:  Clear to auscultation without rales, wheezing or rhonchi   ABDOMEN: Soft, non-tender, non-distended EXTREMITIES:  No edema; No deformity   EKG today demonstrates  Vent. rate 72 BPM PR interval 162 ms QRS duration 88 ms QT/QTcB 384/420 ms P-R-T axes 96 -45 28 Normal sinus rhythm with sinus arrhythmia Left axis deviation Abnormal ECG When compared with ECG of 19-Jan-2023 13:32, PREVIOUS ECG IS PRESENT  Echo N/A  ASSESSMENT & PLAN CHA2DS2-VASc Score = 4  The patient's score is based upon: CHF History: 0 HTN History: 1 Diabetes History: 1 Stroke History: 0 Vascular Disease History: 0 Age Score: 2 Gender Score: 0       ASSESSMENT AND PLAN: Paroxysmal Atrial Fibrillation (ICD10:  I48.0) The patient's CHA2DS2-VASc score is 4, indicating a 4.8% annual risk of stroke.    He is currently in NSR. I discussed with patient's wife the following: Education provided about Afib. We discussed potential triggers. After discussion, we will proceed with conservative observation at this time. Rhythm monitoring device recommended.   Continue Toprol 100 mg daily.   Secondary Hypercoagulable State (ICD10:  D68.69) The patient is at significant risk for stroke/thromboembolism based upon his CHA2DS2-VASc Score of 4.  Start Apixaban (Eliquis).  Dose of 5 mg is appropriate based on weight and most recent creatinine level. Would recommend repeat CBC at upcoming PCP visit in February.     Follow up as scheduled with Cardiology. Follow up Afib clinic prn.    Lake Bells, PA-C  Afib Clinic [facility] [address] [place], [place] [address] [phone]

## 2023-04-15 ENCOUNTER — Other Ambulatory Visit: Payer: Self-pay | Admitting: Family Medicine

## 2023-04-18 ENCOUNTER — Other Ambulatory Visit: Payer: Self-pay | Admitting: Family Medicine

## 2023-04-19 ENCOUNTER — Other Ambulatory Visit: Payer: Self-pay | Admitting: Family Medicine

## 2023-04-19 NOTE — Telephone Encounter (Signed)
Copied from CRM [id number]. Topic: Clinical - Medication Refill >> Apr 19, 2023  1:56 PM Florestine Avers wrote: Most Recent Primary Care Visit:  Provider: [name]  Department: [facility]  Visit Type: OFFICE VISIT  Date: 03/29/2023  Medication: levothyroxine (SYNTHROID) 50 MCG tablet   Has the patient contacted their pharmacy? Yes (Agent: If no, request that the patient contact the pharmacy for the refill. If patient does not wish to contact the pharmacy document the reason why and proceed with request.) (Agent: If yes, when and what did the pharmacy advise?)  Is this the correct pharmacy for this prescription? Yes If no, delete pharmacy and type the correct one.  This is the patient's preferred pharmacy:  [id number] [place], [place] - [address] [place] [address] Phone: [phone] Fax: [phone]   Has the prescription been filled recently? Yes  Is the patient out of the medication? No  Has the patient been seen for an appointment in the last year OR does the patient have an upcoming appointment? Yes  Can we respond through MyChart? Yes  Agent: Please be advised that Rx refills may take up to 3 business days. We ask that you follow-up with your pharmacy.

## 2023-04-19 NOTE — Progress Notes (Deleted)
 Cardiology Office Note    Patient Name: Eddie Ferguson. Noe Gens Date of Encounter: 04/19/2023  Primary Care Provider:  [name], MD Primary Cardiologist:  None Primary Electrophysiologist: None   Past Medical History    Past Medical History:  Diagnosis Date   Arthritis    Basal cell carcinoma 05/28/2014   L nasal tip. Mohs Dr. Park Liter.     Cataract    bilaterally removed   Diabetes mellitus    GERD (gastroesophageal reflux disease)    Glaucoma    Hyperlipidemia    Hypertension    Hypothyroidism    Pneumonia 04/12/2006   Prostate cancer (HCC)    Seasonal allergies    Sinusitis    treated for bacterial infection at least once a year at [facility]   Stomach ulcer 2010   bleeding ulcer result NSAIDS    History of Present Illness  [name]. Christopher is a 83 y.o. male with a PMH of atrial flutter (on Eliquis), HTN, HLD, hypothyroidism, dementia, prostate cancer, DM type II, PSVT who presents today for 13-month follow-up.  Mr. Federici was seen initially by Dr. Antoine Poche by referral of PCP for possible atrial tachycardia.  He wore an event monitor that showed brief SVT and was started on Toprol-XL 50 mg.  He was seen by his PCP and was found to have atrial flutter with RVR however difficult to confirm due to baseline artifact.  He was started on Eliquis 5 mg twice daily.  He was seen in the AF clinic by Lake Bells, PA and was in sinus rhythm and tolerating blood thinner without any adverse reactions.  He was continued on current dose of Toprol-XL with instructions to follow-up with AF clinic as needed.  During today's visit the patient reports*** .  Patient denies chest pain, palpitations, dyspnea, PND, orthopnea, nausea, vomiting, dizziness, syncope, edema, weight gain, or early satiety.  ***Notes: Patient may need long-term device and possible 2D echo? -Last ischemic evaluation: -Last echo: -Interim ED visits: Review of Systems  Please see the history of present illness.     All other systems reviewed and are otherwise negative except as noted above.  Physical Exam    Wt Readings from Last 3 Encounters:  04/03/23 168 lb (76.2 kg)  03/29/23 170 lb (77.1 kg)  03/21/23 168 lb (76.2 kg)   YN:WGNFA were no vitals filed for this visit.,There is no height or weight on file to calculate BMI. GEN: Well nourished, well developed in no acute distress Neck: No JVD; No carotid bruits Pulmonary: Clear to auscultation without rales, wheezing or rhonchi  Cardiovascular: Normal rate. Regular rhythm. Normal S1. Normal S2.   Murmurs: There is no murmur.  ABDOMEN: Soft, non-tender, non-distended EXTREMITIES:  No edema; No deformity   EKG/LABS/ Recent Cardiac Studies   ECG personally reviewed by me today - ***  Risk Assessment/Calculations:   {Does this patient have ATRIAL FIBRILLATION?:[phone]}      Lab Results  Component Value Date   WBC 12.6 (H) 03/29/2023   HGB 13.4 03/29/2023   HCT 40.8 03/29/2023   MCV 90.7 03/29/2023   PLT 333.0 03/29/2023   Lab Results  Component Value Date   CREATININE 1.01 03/29/2023   BUN 17 03/29/2023   NA 139 03/29/2023   K 4.1 03/29/2023   CL 97 03/29/2023   CO2 32 03/29/2023   Lab Results  Component Value Date   CHOL 121 03/29/2023   HDL 48.90 03/29/2023   LDLCALC 57 03/29/2023   LDLDIRECT 90.0 09/28/2016  TRIG 74.0 03/29/2023   CHOLHDL 2 03/29/2023    Lab Results  Component Value Date   HGBA1C 7.4 (H) 03/15/2023   Assessment & Plan    1.***  2.***  3.***  4.***      Disposition: Follow-up with None or APP in *** months {Are you ordering a CV Procedure (e.g. stress test, cath, DCCV, TEE, etc)?   Press F2        :[id number]}   Signed, Napoleon Form, Leodis Rains, NP 04/19/2023, 12:03 PM [facility]

## 2023-04-19 NOTE — Telephone Encounter (Signed)
Copied from CRM [id number]. Topic: Clinical - Prescription Issue >> Apr 19, 2023  1:58 PM Florestine Avers wrote: Reason for CRM: Patients [facility] called in stating [facility] a [facility] levothyroxine (SYNTHROID) 50 MCG tablet . There is also a pending CRM, patient has an appointment tomorrow.

## 2023-04-20 ENCOUNTER — Ambulatory Visit: Payer: Medicare HMO | Admitting: Nurse Practitioner

## 2023-04-21 ENCOUNTER — Other Ambulatory Visit: Payer: Medicare HMO

## 2023-05-01 ENCOUNTER — Ambulatory Visit (INDEPENDENT_AMBULATORY_CARE_PROVIDER_SITE_OTHER): Payer: Medicare HMO | Admitting: Family Medicine

## 2023-05-01 ENCOUNTER — Encounter: Payer: Self-pay | Admitting: Family Medicine

## 2023-05-01 VITALS — BP 124/70 | HR 73 | Temp 98.2°F | Ht 69.0 in | Wt 156.8 lb

## 2023-05-01 DIAGNOSIS — E785 Hyperlipidemia, unspecified: Secondary | ICD-10-CM

## 2023-05-01 DIAGNOSIS — G309 Alzheimer's disease, unspecified: Secondary | ICD-10-CM | POA: Diagnosis not present

## 2023-05-01 DIAGNOSIS — E1122 Type 2 diabetes mellitus with diabetic chronic kidney disease: Secondary | ICD-10-CM | POA: Diagnosis not present

## 2023-05-01 DIAGNOSIS — N183 Chronic kidney disease, stage 3 unspecified: Secondary | ICD-10-CM | POA: Diagnosis not present

## 2023-05-01 DIAGNOSIS — F0280 Dementia in other diseases classified elsewhere without behavioral disturbance: Secondary | ICD-10-CM

## 2023-05-01 DIAGNOSIS — E039 Hypothyroidism, unspecified: Secondary | ICD-10-CM

## 2023-05-01 DIAGNOSIS — R634 Abnormal weight loss: Secondary | ICD-10-CM | POA: Diagnosis not present

## 2023-05-01 DIAGNOSIS — N1831 Chronic kidney disease, stage 3a: Secondary | ICD-10-CM

## 2023-05-01 DIAGNOSIS — I10 Essential (primary) hypertension: Secondary | ICD-10-CM

## 2023-05-01 NOTE — Patient Instructions (Addendum)
 Call hospice directly within a week if you don't hear with # listed below. I think this is very reasonable and not only the right move for him but also for you - we need to get you more support.   Recommended follow up: as needed- I am here as you need it but we want to minimize visits except for what helps him feel most comfortable.  -we opted to hold off on blood flow test for right leg for now  - we opted to stop pursuing hematology workup and bloodwork for weight loss - could cancel cardiology visit as long as heart rate remains controlled.  -may even chat with them about stopping memory medicines and neurology follow up

## 2023-05-01 NOTE — Progress Notes (Signed)
 Phone [phone] In person visit   Subjective:   Eddie Ferguson is a 83 y.o. year old very pleasant male patient who presents for/with See problem oriented charting Chief Complaint  Patient presents with   1 month f/u    Past Medical History-  Patient Active Problem List   Diagnosis Date Noted   Atrial fibrillation (HCC) 03/29/2023    Priority: High   Dementia due to Alzheimer's disease (HCC) 06/16/2015    Priority: High   History of prostate cancer-follows with Dr. Laverle Patter of [facility] 05/28/2014    Priority: High   Type 2 diabetes mellitus with diabetic chronic kidney disease (HCC) 04/23/2014    Priority: High   Aortic atherosclerosis (HCC) 01/05/2018    Priority: Medium    CKD (chronic kidney disease), stage III (HCC) 04/18/2017    Priority: Medium    Gout 07/09/2015    Priority: Medium    Essential hypertension 04/23/2014    Priority: Medium    Hyperlipidemia 04/23/2014    Priority: Medium    Hypothyroidism 04/23/2014    Priority: Medium    Adenomatous colon polyp 05/28/2014    Priority: Low   Osteoarthritis 05/28/2014    Priority: Low   Erectile dysfunction 05/28/2014    Priority: Low   Basal cell carcinoma 05/28/2014    Priority: Low   GERD (gastroesophageal reflux disease) 04/23/2014    Priority: Low   Allergic rhinitis 04/23/2014    Priority: Low   Glaucoma 04/23/2014    Priority: Low   History of nonmelanoma skin cancer 11/18/2012    Priority: Low   Paroxysmal atrial fibrillation (HCC) 04/03/2023   Hypercoagulable state due to paroxysmal atrial fibrillation (HCC) 04/03/2023   Pain due to onychomycosis of toenails of both feet 08/17/2020   Porokeratosis 08/17/2020   Positive FIT (fecal immunochemical test) 06/17/2020   Intermittent diarrhea 06/17/2020   History of shingles 04/24/2011    Medications- reviewed and updated Current Outpatient Medications  Medication Sig Dispense Refill   acetaminophen (TYLENOL) 500 MG tablet Take 500 mg  by mouth every 6 (six) hours as needed. Reported on 06/16/2015     allopurinol (ZYLOPRIM) 100 MG tablet TAKE 1 TABLET BY MOUTH TWICE A DAY 180 tablet 3   apixaban (ELIQUIS) 5 MG TABS tablet Take 1 tablet (5 mg total) by mouth 2 (two) times daily. 60 tablet 5   benazepril (LOTENSIN) 40 MG tablet TAKE 1 TABLET BY MOUTH EVERY DAY 90 tablet 3   chlorthalidone (HYGROTON) 25 MG tablet TAKE 1/2 TO 1 TABLET BY MOUTH DAILY 90 tablet 1   donepezil (ARICEPT) 10 MG tablet Take 1 tablet (10 mg total) by mouth daily. 30 tablet 11   famotidine (PEPCID) 20 MG tablet Take 20 mg by mouth daily.     hydrocortisone 2.5 % ointment Apply topically 2 (two) times daily. For 7-10 days for hemorrhoid tags- Can reuse in future but need at least 1 week break 30 g 2   KLOR-CON M20 20 MEQ tablet TAKE 2 TABLETS BY MOUTH TWICE A DAY 360 tablet 2   latanoprost (XALATAN) 0.005 % ophthalmic solution Place 1 drop into both eyes at bedtime.     levothyroxine (SYNTHROID) 50 MCG tablet TAKE 1 TABLET BY MOUTH EVERY DAY 90 tablet 3   memantine (NAMENDA) 10 MG tablet Take 1 tablet twice a day 180 tablet 3   metoprolol succinate (TOPROL-XL) 100 MG 24 hr tablet Take 1 tablet (100 mg total) by mouth daily. Take with or immediately following a  meal. 189 tablet 3   Multiple Vitamin (MULTIVITAMIN) tablet Take 1 tablet by mouth daily.     pravastatin (PRAVACHOL) 40 MG tablet TAKE 1 TABLET BY MOUTH EVERY DAY 90 tablet 3   No current facility-administered medications for this visit.     Objective:  BP 124/70   Pulse 73   Temp 98.2 F (36.8 C)   Ht 5\' 9"  (1.753 m)   Wt 156 lb 12.8 oz (71.1 kg)   SpO2 96%   BMI 23.16 kg/m  Gen: NAD, resting comfortably CV: RRR no murmurs rubs or gallops Lungs: CTAB no crackles, wheeze, rhonchi Ext: 1+ edema on the right leg, trace edema on the left.  Right foot is slightly cool compared to the left with 2+ DP pulse but minimally palpable PT pulse  Skin: warm, dry Neuro: In wheelchair today     Assessment and Plan    # Alzheimer's dementia- sees Dr. Karel Jarvis S:medication:  aricept/donepezil 10 mg daily and namenda/memantine 10 mg daily (previously as high as 23 mg and donepezil and memantine 10 mg twice daily) - Lower dose helped with hallucinations -[facility] referral was not helpful- they basically [facility]  -Heavy caregiver burden on wife-he has significant pain in his knee and hip and is largely wheelchair-bound and fully dependent on her.  She even fully closed send at this point -has had some bowel incontinence on new carpet- can't explain why this happens to his wife.  -sleeping a lot more A/P: Patient with significant ongoing decline in mental faculties but also quality of life.  I suspect his appetite suppression is related to Alzheimer's as well causing tremendous weight loss as below.  We had a very difficult conversation today but wife and family believe he is rapidly declining and they are open to pursuing hospice-referral was placed at this time -We discussed option of trying to aggressively pursue/workup/weight loss/potential peripheral arterial disease/leukocytosis and ultimately opted out-see those individual discussions below -Discussed per AVS potential ways to decrease visits overall and remain at home is much as possible where he seems to be most comfortable -Discussed option of withdrawing memantine and donepezil-they have like to discuss with hospice  #Weight loss S: down 12 lbs from visit a month ago. Appetite is low. 2 meals a day with sleeping more -very inactive with hips, knees, elbow discomfort- in wheelchair today.   Weight peak actually 220 in October 2023 to low of 156.8 today A/P: We discussed potential unintentional weight loss workup including labs and x-ray.  He has already had CT of his abdomen pelvis 02/28/2023 for abdominal pain which did not reveal source of pain nor cause of weight loss.  As above and Alzheimer's  discussion we believe he would want to minimize interventions/workup and we have opted not to pursue this jointly   #Foot changes S: right foot seems cold to touch and he has reduced sensation and tops of feet are red- gets some peeling A/P: Discussed potential ABIs/vascular consult with diminished DP pulse on the right foot and cool feeling-in the end we opted to pursue hospice instead-do not think if needed he would want to pursue vascular intervention  # Leukocytosis and mild anemia-referred to hematology previously-appears they only sent him a MyChart letter-as above discussed option of hematology consult but with transition to quality of life/comfort care we opted not to pursue this  # Atrial fibrillation-discovered in 2025 S: Rate controlled with metoprolol 100 mg extended release Anticoagulated with Eliquis 5 mg twice  daily -Casey Burkitt was recommended- not convenient to use for wife who is already inundated with caregiving A/P: .appropriately anticoagulated and rate controlled- continue current medicine  -Likely prefers not to return to cardiology  # Diabetes S: Medication:metformin 500mg  started 11/29/21- fatigue in past on this and stopped in 2024 due to GI issues  A/P: Suspect worsening control of A1c but with decline in overall health decided to remain off of medicine and pursue quality of life for most-I am open to medication if suggested by hospice  #hypertension #CKD III- GFR typically 1.2-1.4 and most recently  S: medication: Benazepril 40 mg, chlorthalidone 25 mg, Does need potassium with chlorthalidone, amlodipine 5 mg changed late 2024 to metoprolol 50 mg ER due to SVT on cardiac monitor and later with atrial flutter increase to 100 mg A/P: Blood pressure well-controlled-continue current medication Chronic kidney disease stage III actually slightly improved on most recent checks-continue to monitor  #hyperlipidemia with aortic atherosclerosis -prior stroke on  imaging S: Medication:Pravastatin 40 mg A/P: For now we will continue pravastatin but may withdrawal in the future  #hypothyroidism S: compliant On thyroid medication-levothyroxine 50 mcg  Lab Results  Component Value Date   TSH 2.39 03/29/2023  A/P: TSH normal recently-does not appear to be the cause of weight loss-continue to monitor  #Gout-no reported flares-continue to monitor  Recommended follow up: Only as needed follow-up planned Future Appointments  Date Time Provider Department Center  05/16/2023  3:15 PM Helane Gunther, DPM [facility]  06/06/2023  3:10 PM Louanne Skye, Devoria Albe., NP [facility]  06/08/2023  2:00 PM Madelyn Brunner, DO [facility] None  06/27/2023  1:00 PM Gwynneth Munson, Sung Amabile, PA-C [facility] None  03/25/2024  2:20 PM [facility] ANNUAL WELLNESS VISIT 1 [facility]    Lab/Order associations:   ICD-10-CM   1. Dementia due to Alzheimer's disease (HCC)  G30.9 Ambulatory referral to Hospice   F02.80 CANCELED: Ambulatory referral to Hospice    2. Unintentional weight loss  R63.4 Ambulatory referral to Hospice    CANCELED: Ambulatory referral to Hospice    3. Type 2 diabetes mellitus with stage 3a chronic kidney disease, without long-term current use of insulin (HCC)  E11.22    N18.31     4. Essential hypertension  I10     5. Hyperlipidemia, unspecified hyperlipidemia type  E78.5     6. Hypothyroidism, unspecified type  E03.9     7. Stage 3 chronic kidney disease, unspecified whether stage 3a or 3b CKD (HCC)  N18.30       Time Spent: 44 minutes of total time (3:09 PM-3:48 PM, 5:42 PM-5:47 PM) was spent on the date of the encounter performing the following actions: chart review prior to seeing the patient, obtaining history, performing a medically necessary exam, counseling on the treatment plan and counseling on option of pursuing quality of life/comfort care over aggressive workup-specifically discussing hospice, placing orders, and documenting in our EHR.     Return precautions advised.  Tana Conch, MD

## 2023-05-01 NOTE — Addendum Note (Signed)
 Addended by: [name] on: 05/01/2023 09:48 PM   Modules accepted: Level of Service

## 2023-05-02 ENCOUNTER — Telehealth: Payer: Self-pay | Admitting: Family Medicine

## 2023-05-02 NOTE — Telephone Encounter (Signed)
 See below

## 2023-05-02 NOTE — Telephone Encounter (Signed)
 Yes I believe he has less than 6 months to live and I will serve as attending

## 2023-05-02 NOTE — Telephone Encounter (Unsigned)
 Copied from CRM [id number]. Topic: General - Other >> May 02, 2023  3:16 PM Deaijah H wrote: Reason for CRM: [facility] called in wanting to know if Dr, Durene Cal believes if the patient has 6 months or less and will he serve as a attendee record / please call [phone]

## 2023-05-03 NOTE — Telephone Encounter (Signed)
 Called and spoke with Cordelia Pen and below message given.

## 2023-05-03 NOTE — Telephone Encounter (Signed)
 Called the number below and phone just rang, no vm. Will try again later.

## 2023-05-05 ENCOUNTER — Telehealth: Payer: Self-pay | Admitting: Family Medicine

## 2023-05-05 NOTE — Telephone Encounter (Signed)
 Forms completed and faxed back.

## 2023-05-05 NOTE — Telephone Encounter (Signed)
 Received faxed document Hospice POC [phone] , to be filled out by provider. Patient requested to send it back via Fax. Document is located in providers tray at front office.Please advise .

## 2023-05-08 ENCOUNTER — Encounter: Payer: Self-pay | Admitting: Family Medicine

## 2023-05-16 ENCOUNTER — Encounter: Payer: Self-pay | Admitting: Podiatry

## 2023-05-16 ENCOUNTER — Ambulatory Visit: Payer: Medicare HMO | Admitting: Podiatry

## 2023-05-16 DIAGNOSIS — D6869 Other thrombophilia: Secondary | ICD-10-CM | POA: Diagnosis not present

## 2023-05-16 DIAGNOSIS — M79674 Pain in right toe(s): Secondary | ICD-10-CM

## 2023-05-16 DIAGNOSIS — Q828 Other specified congenital malformations of skin: Secondary | ICD-10-CM

## 2023-05-16 DIAGNOSIS — E1122 Type 2 diabetes mellitus with diabetic chronic kidney disease: Secondary | ICD-10-CM | POA: Diagnosis not present

## 2023-05-16 DIAGNOSIS — B351 Tinea unguium: Secondary | ICD-10-CM

## 2023-05-16 DIAGNOSIS — I480 Paroxysmal atrial fibrillation: Secondary | ICD-10-CM | POA: Diagnosis not present

## 2023-05-16 DIAGNOSIS — N1831 Chronic kidney disease, stage 3a: Secondary | ICD-10-CM

## 2023-05-16 DIAGNOSIS — M79675 Pain in left toe(s): Secondary | ICD-10-CM

## 2023-05-16 DIAGNOSIS — N183 Chronic kidney disease, stage 3 unspecified: Secondary | ICD-10-CM | POA: Diagnosis not present

## 2023-05-16 NOTE — Progress Notes (Signed)
 This patient returns to my office for at risk foot care.  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  This patient is unable to cut nails himself since the patient cannot reach his nails.These nails are painful walking and wearing shoes. Marland Kitchen He presents to the office with his wife.Painful callus on outside of both feet. This patient presents for at risk foot care today.  General Appearance  Alert, conversant and in no acute stress.  Vascular  Dorsalis pedis and posterior tibial  pulses are palpable  bilaterally.  Capillary return is within normal limits  bilaterally. Temperature is within normal limits  bilaterally.  Neurologic  Senn-Weinstein monofilament wire test within normal limits  bilaterally. Muscle power within normal limits bilaterally.  Nails Thick disfigured discolored nails with subungual debris  from hallux to fifth toes bilaterally. No evidence of bacterial infection or drainage bilaterally.  Orthopedic  No limitations of motion  feet .  No crepitus or effusions noted.  No bony pathology or digital deformities noted.  Skin  normotropic skin with no porokeratosis noted bilaterally.  No signs of infections or ulcers noted.   Callus sub 5th met B/L  symptomatic.  Onychomycosis  Pain in right toes  Pain in toes Callus sub 5th right foot.  Consent was obtained for treatment procedures.   Mechanical debridement of nails 1-5  bilaterally performed with a nail nipper.  Filed with dremel without incident.  Debride porokeratosis with # 15 blade.  Padding added to shoes.  Dispense heel cushion for right heel.   Return office visit  10 weeks                 Told patient to return for periodic foot care and evaluation due to potential at risk complications.   Helane Gunther DPM

## 2023-05-29 ENCOUNTER — Telehealth: Payer: Self-pay | Admitting: Family Medicine

## 2023-05-29 NOTE — Telephone Encounter (Signed)
 Patient dropped off document Home Health Certificate (Order ID [id number]), to be filled out by provider. Patient requested to send it back via Fax within 5-days. Document is located in providers tray at front office.Please advise

## 2023-05-30 NOTE — Telephone Encounter (Signed)
Received and placed in PCP box

## 2023-05-30 NOTE — Telephone Encounter (Signed)
 Forms signed and faxed back.

## 2023-06-05 NOTE — Progress Notes (Unsigned)
 Cardiology Office Note    Patient Name: Eddie Ferguson. Eddie Ferguson Date of Encounter: 06/06/2023  Primary Care Provider:  [name], MD Primary Cardiologist:  None Primary Electrophysiologist: None   Past Medical History    Past Medical History:  Diagnosis Date   Arthritis    Basal cell carcinoma 05/28/2014   L nasal tip. Mohs Dr. Park Liter.     Cataract    bilaterally removed   Diabetes mellitus    GERD (gastroesophageal reflux disease)    Glaucoma    Hyperlipidemia    Hypertension    Hypothyroidism    Pneumonia 04/12/2006   Prostate cancer (HCC)    Seasonal allergies    Sinusitis    treated for bacterial infection at least once a year at [facility]   Stomach ulcer 2010   bleeding ulcer result NSAIDS    History of Present Illness  Eddie Ferguson. Brancato is a 83 y.o. male with a PMH of paroxysmal AF, HTN, HLD, hypothyroidism, prostate CAD GERD, dementia, DM type II who presents today for 79-month follow-up.  Eddie Ferguson was seen on 01/19/2023 by Dr. Antoine Poche for evaluation of irregular heartbeat.  He has severe dementia and is primarily wheelchair-bound but can transfer from the wheelchair to the bed and toilet.  He wore a 3-day Zio patch that showed SVT and patient was started on Toprol XL 50 mg.  He was seen by Lake Bells, PA on 04/03/2023 in the AF clinic for consultation.  Eddie Ferguson was seen by his PCP on 03/29/2023 for routine follow-up and was noted to have irregular tachyarrhythmia with EKG verifying atrial flutter with RVR.  He was started on Eliquis and Toprol increased to 100 mg daily.  During his evaluation he was in sinus rhythm.  He was continued on rate control and Eliquis.  Eddie Ferguson presents today for follow-up with his wife.  During today's visit he has been doing well with no recurrence of atrial fibrillation.  He is rate controlled at 68 bpm and blood pressure is stable at 118/60. He is currently on Eliquis and metoprolol. Palliative care became involved about a month  ago, and they have not been refilling his medications, except for Eliquis, which is now refilled monthly. There is a concern that palliative care may discontinue his medications except for life support. He experiences swelling in his feet, which sometimes improves with elevation. He finds it difficult to elevate his feet due to discomfort and arthritis in his hips, knees, and arms, which makes movement painful. He has lost weight, now weighing 155 pounds, and has a poor appetite and limited water  Patient denies chest pain, palpitations, dyspnea, PND, orthopnea, nausea, vomiting, dizziness, syncope, edema, weight gain, or early satiety.  Discussed the use of AI scribe software for clinical note transcription with the patient, who gave verbal consent to proceed.  History of Present Illness   Review of Systems  Please see the history of present illness.    All other systems reviewed and are otherwise negative except as noted above.  Physical Exam    Wt Readings from Last 3 Encounters:  06/06/23 155 lb (70.3 kg)  05/01/23 156 lb 12.8 oz (71.1 kg)  04/03/23 168 lb (76.2 kg)   VS: Vitals:   06/06/23 1522  BP: 118/60  Pulse: 68  SpO2: 96%  ,Body mass index is 22.89 kg/m. GEN: Well nourished, well developed in no acute distress Neck: No JVD; No carotid bruits Pulmonary: Clear to auscultation without rales, wheezing or rhonchi  Cardiovascular: Normal rate. Regular rhythm. Normal S1. Normal S2.   Murmurs: There is no murmur.  ABDOMEN: Soft, non-tender, non-distended EXTREMITIES:  No edema; No deformity   EKG/LABS/ Recent Cardiac Studies   ECG personally reviewed by me today -none completed today  Risk Assessment/Calculations:    CHA2DS2-VASc Score = 4   This indicates a 4.8% annual risk of stroke. The patient's score is based upon: CHF History: 0 HTN History: 1 Diabetes History: 1 Stroke History: 0 Vascular Disease History: 0 Age Score: 2 Gender Score: 0         Lab  Results  Component Value Date   WBC 12.6 (H) 03/29/2023   HGB 13.4 03/29/2023   HCT 40.8 03/29/2023   MCV 90.7 03/29/2023   PLT 333.0 03/29/2023   Lab Results  Component Value Date   CREATININE 1.01 03/29/2023   BUN 17 03/29/2023   NA 139 03/29/2023   K 4.1 03/29/2023   CL 97 03/29/2023   CO2 32 03/29/2023   Lab Results  Component Value Date   CHOL 121 03/29/2023   HDL 48.90 03/29/2023   LDLCALC 57 03/29/2023   LDLDIRECT 90.0 09/28/2016   TRIG 74.0 03/29/2023   CHOLHDL 2 03/29/2023    Lab Results  Component Value Date   HGBA1C 7.4 (H) 03/15/2023   Assessment & Plan    1.  Paroxysmal AF: -Heart rate is well-controlled at 68 bpm with no recent episodes of palpitations or tachycardia. No bleeding issues reported with Eliquis. Palliative care is involved, and there is a plan to discontinue non-essential medications, but Eliquis and Toprol-XL are continued for cardiac management. - Continue Eliquis 5 mg twice daily for anticoagulation - Continue Toprol-XL 100 mg daily for rate control  2.  Essential hypertension: -Patient's blood pressure is stable at 118/60 -Continue Lotensin 40 mg, Toprol-XL 100 mg, chlorthalidone 25 mg  3.  Hyperlipidemia: -Patient's last LDL cholesterol was stable at 57 -Continue pravastatin 40 mg daily  4.  DM type II: -Patient's last hemoglobin A1c was 7.4 -Continue current treatment plan per PCP  5. Dementia: Primary concern is dementia, which affects daily functioning and medication management. Palliative care is involved in management, and there is a plan to discontinue non-essential medications. Coordination with palliative care is essential to ensure appropriate medication management and support. - Continue current medications as per palliative care guidance - Coordinate with palliative care for medication management  Disposition: Follow-up with as needed    Signed, Napoleon Form, Leodis Rains, NP 06/06/2023, 3:49 PM [facility]

## 2023-06-06 ENCOUNTER — Encounter: Payer: Self-pay | Admitting: Nurse Practitioner

## 2023-06-06 ENCOUNTER — Ambulatory Visit: Payer: Medicare HMO | Attending: Nurse Practitioner | Admitting: Nurse Practitioner

## 2023-06-06 VITALS — BP 118/60 | HR 68 | Ht 69.0 in | Wt 155.0 lb

## 2023-06-06 DIAGNOSIS — I480 Paroxysmal atrial fibrillation: Secondary | ICD-10-CM | POA: Diagnosis not present

## 2023-06-06 DIAGNOSIS — E1122 Type 2 diabetes mellitus with diabetic chronic kidney disease: Secondary | ICD-10-CM

## 2023-06-06 DIAGNOSIS — I10 Essential (primary) hypertension: Secondary | ICD-10-CM

## 2023-06-06 DIAGNOSIS — N1831 Chronic kidney disease, stage 3a: Secondary | ICD-10-CM

## 2023-06-06 DIAGNOSIS — E785 Hyperlipidemia, unspecified: Secondary | ICD-10-CM | POA: Diagnosis not present

## 2023-06-06 DIAGNOSIS — G309 Alzheimer's disease, unspecified: Secondary | ICD-10-CM

## 2023-06-06 DIAGNOSIS — F0280 Dementia in other diseases classified elsewhere without behavioral disturbance: Secondary | ICD-10-CM

## 2023-06-06 NOTE — Patient Instructions (Signed)
 Medication Instructions:  Your physician recommends that you continue on your current medications as directed. Please refer to the Current Medication list given to you today. *If you need a refill on your cardiac medications before your next appointment, please call your pharmacy*  Lab Work: NONE ORDERED If you have labs (blood work) drawn today and your tests are completely normal, you will receive your results only by: MyChart Message (if you have MyChart) OR A paper copy in the mail If you have any lab test that is abnormal or we need to change your treatment, we will call you to review the results.  Testing/Procedures: NONE ORDERED  Follow-Up: At [facility], you and your health needs are our priority.  As part of our continuing mission to provide you with exceptional heart care, our providers are all part of one team.  This team includes your primary Cardiologist (physician) and Advanced Practice Providers or APPs (Physician Assistants and Nurse Practitioners) who all work together to provide you with the care you need, when you need it.  Your next appointment:   As needed   Provider:   Rollene Rotunda, MD   We recommend signing up for the patient portal called "MyChart".  Sign up information is provided on this After Visit Summary.  MyChart is used to connect with patients for Virtual Visits (Telemedicine).  Patients are able to view lab/test results, encounter notes, upcoming appointments, etc.  Non-urgent messages can be sent to your provider as well.   To learn more about what you can do with MyChart, go to [email or web address].   Other Instructions       1st Floor: - Lobby - Registration  - Pharmacy  - Lab - Cafe  2nd Floor: - PV Lab - Diagnostic Testing (echo, CT, nuclear med)  3rd Floor: - Vacant  4th Floor: - TCTS (cardiothoracic surgery) - AFib Clinic - Structural Heart Clinic - Vascular Surgery  - Vascular Ultrasound  5th Floor: -  HeartCare Cardiology (general and EP) - Clinical Pharmacy for coumadin, hypertension, lipid, weight-loss medications, and med management appointments    Valet parking services will be available as well.

## 2023-06-08 ENCOUNTER — Ambulatory Visit: Payer: Medicare HMO | Admitting: Sports Medicine

## 2023-06-08 ENCOUNTER — Other Ambulatory Visit: Payer: Self-pay

## 2023-06-08 ENCOUNTER — Encounter: Payer: Self-pay | Admitting: Sports Medicine

## 2023-06-08 DIAGNOSIS — M25551 Pain in right hip: Secondary | ICD-10-CM | POA: Diagnosis not present

## 2023-06-08 DIAGNOSIS — F0280 Dementia in other diseases classified elsewhere without behavioral disturbance: Secondary | ICD-10-CM

## 2023-06-08 DIAGNOSIS — M1611 Unilateral primary osteoarthritis, right hip: Secondary | ICD-10-CM | POA: Diagnosis not present

## 2023-06-08 DIAGNOSIS — G8929 Other chronic pain: Secondary | ICD-10-CM

## 2023-06-08 DIAGNOSIS — G309 Alzheimer's disease, unspecified: Secondary | ICD-10-CM | POA: Diagnosis not present

## 2023-06-08 NOTE — Progress Notes (Signed)
 Eddie Ferguson. Eddie Ferguson - 83 y.o. male MRN [id number]  Date of birth: 05-Jun-1940  Office Visit Note: Visit Date: 06/08/2023 PCP: [name], MD Referred by: [name], MD  Subjective: Chief Complaint  Patient presents with   Right Hip - Pain   HPI: Eddie Ferguson is a pleasant 83 y.o. male who presents today for chronic right hip pain, in the setting of advanced Alzheimer's dementia.  His wife, Eddie Ferguson, is present during the visit and does provide the majority of HPI today.  Per Eddie Ferguson has been declining in terms of his Alzheimer dementia.  I did review his note from Dr. Durene Cal on 05/01/2023 as well as his cardiologist on/1/25.  Eddie Ferguson has been continuing to lose weight.  Eddie Ferguson states that he is having difficulty with his appetite, this is low.  He has had a few episodes of falling asleep in his chair while eating.  At this point, he is not walking on his own, he is essentially wheelchair dependent.  They have brought on palliative care for him in which they are coming 3 days weekly to help care for him.  Eddie Ferguson states she is struggling with deciding whether they need fullterm around-the-clock care at home versus placing him into a memory care/Alzheimer's facility.  He is on Aricept/donepezil 10 mg daily and Namenda/memantine 10 mg daily.  In terms of his right hip, he has alternated between both ultrasound-guided intra-articular hip injections and greater trochanteric injections.  These do give him relief but only for a few days up to a week at the most.  His wife Eddie Ferguson does admit it is somewhat difficult getting to the appointments with the transferring help that he needs.  We had a discussion today regarding the risk/benefits and indications of subsequent injections.  Pertinent ROS were reviewed with the patient and found to be negative unless otherwise specified above in HPI.   Assessment & Plan: Visit Diagnoses:  1. Chronic right hip pain   2. Unilateral primary osteoarthritis,  right hip   3. Dementia due to Alzheimer's disease [facility])    Plan: Impression is chronic right hip pain in the setting of mild to moderate arthritic change as well as proximal muscle weakness and debility secondary to his severe and advancing Alzheimer's dementia.  At this time, Eddie Ferguson is essentially wheelchair-bound and is requiring help with ADLs, transferring and ambulation.  His wife, Eddie Ferguson, has been doing an excellent job taking care of him and they have recently brought on palliative care he was coming 3 times weekly.  She wanted my opinion today regarding pain relief, future injections or other medication that could help him.  We decided that injection therapy has only had very temporary relief, and at this point I do not think it would be wise to continue with these procedures.  I do think he may benefit from a very low-dose of a pain medication, I discussed her talking about this with his palliative care physician. WE discussed trialing a low-dose opioid that would help take away his significant pain but not make him drowsy or affect his personality, she is agreeable to discussing this with the palliative doctor and/or his primary care physician.  They did bring in a handicap placard card, I did fill this out during the office visit today and return to his wife for them to drop off at the [facility].  If at some point, they would like to trial another injection for short-term relief, they can always represent but  for now we will keep follow-up on an as-needed basis.  Follow-up: Return if symptoms worsen or fail to improve.   Meds & Orders: No orders of the defined types were placed in this encounter.   Orders Placed This Encounter  Procedures   US Guided Needle Placement - No Linked Charges     Procedures: No procedures performed      Clinical History: No specialty comments available.  He reports that he quit smoking about 22 years ago. His smoking use included cigarettes. He has never used  smokeless tobacco.  Recent Labs    09/26/22 1609 03/15/23 1424  HGBA1C 7.1* 7.4*    Objective:    Physical Exam  Gen: Well-appearing, in no acute distress; non-toxic CV: Well-perfused. Warm.  Resp: Breathing unlabored on room air; no wheezing. Psych: Fluid speech in conversation; appropriate affect; normal thought process  Ortho Exam - Right hip: Patient is wheelchair-bound.  He does have weakness of his proximal muscle group, is unable to stand or transfer from sit to standing without near 100% help.  Imaging: No results found.  Past Medical/Family/Surgical/Social History: Medications & Allergies reviewed per EMR, new medications updated. Patient Active Problem List   Diagnosis Date Noted   Paroxysmal atrial fibrillation (HCC) 04/03/2023   Hypercoagulable state due to paroxysmal atrial fibrillation (HCC) 04/03/2023   Atrial fibrillation (HCC) 03/29/2023   Pain due to onychomycosis of toenails of both feet 08/17/2020   Porokeratosis 08/17/2020   Positive FIT (fecal immunochemical test) 06/17/2020   Intermittent diarrhea 06/17/2020   Aortic atherosclerosis (HCC) 01/05/2018   CKD (chronic kidney disease), stage III (HCC) 04/18/2017   Gout 07/09/2015   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 Laverle Patter of [facility] 05/28/2014   Osteoarthritis 05/28/2014   Erectile dysfunction 05/28/2014   Basal cell carcinoma 05/28/2014   Essential hypertension 04/23/2014   Hyperlipidemia 04/23/2014   GERD (gastroesophageal reflux disease) 04/23/2014   Type 2 diabetes mellitus with diabetic chronic kidney disease (HCC) 04/23/2014   Hypothyroidism 04/23/2014   Allergic rhinitis 04/23/2014   Glaucoma 04/23/2014   History of nonmelanoma skin cancer 11/18/2012   History of shingles 04/24/2011   Past Medical History:  Diagnosis Date   Arthritis    Basal cell carcinoma 05/28/2014   L nasal  tip. Mohs Dr. Park Liter.     Cataract    bilaterally removed   Diabetes mellitus    GERD (gastroesophageal reflux disease)    Glaucoma    Hyperlipidemia    Hypertension    Hypothyroidism    Pneumonia 04/12/2006   Prostate cancer (HCC)    Seasonal allergies    Sinusitis    treated for bacterial infection at least once a year at [facility]   Stomach ulcer 2010   bleeding ulcer result NSAIDS   Family History  Problem Relation Age of Onset   Diabetes Mother    Diabetes Father    Hyperlipidemia Father    Colon polyps Father    Brain cancer Brother        smoker   Dementia Paternal Grandmother    Colon cancer Neg Hx    Esophageal cancer Neg Hx    Stomach cancer Neg Hx    Rectal cancer Neg Hx    Past Surgical History:  Procedure Laterality Date   abdominl abcess     03-15-2011   BACK SURGERY     CATARACT EXTRACTION, BILATERAL  04-07-2014, 03-17-2014   CYSTOURETHROSCOPY  11/11/1998   LUMBAR LAMINECTOMY  2007   [place]   MOHS SURGERY     left side of nose   POLYPECTOMY     PROSTATE BIOPSY     radiation 2016   RECONSTRUCTION TENDON PULLEY W/ TENDON / FASCIAL GRAFT OF HAND / FINGER  1982   rt 3rd finger   RECONSTRUCTION TENDON PULLEY W/ TENDON / FASCIAL GRAFT OF HAND / FINGER  1953   rt 5th finger   UPPER GASTROINTESTINAL ENDOSCOPY     Social History   Occupational History   Occupation: retired  Tobacco Use   Smoking status: Former    Current packs/day: 0.00    Types: Cigarettes    Quit date: 02/03/2001    Years since quitting: 22.3   Smokeless tobacco: Never   Tobacco comments:    Former smoker 04/03/23  Vaping Use   Vaping status: Never Used  Substance and Sexual Activity   Alcohol use: Yes    Alcohol/week: 0.0 standard drinks of alcohol    Comment: occasional beer   Drug use: No   Sexual activity: Not Currently    Partners: Female   I spent 46 minutes in the care of the patient today including face-to-face time, preparation to see the patient, as well as  review of previous imaging, review of both cardiology and prior primary care notes, discussion on risk/benefits/indications for injection therapy, and discussion with patient's wife regarding onset of palliative care and associated medicinal and healthcare associated treatment for the patient's above conditions and the above diagnoses.   Madelyn Brunner, DO Primary Care Sports Medicine Physician  [facility] - Orthopedics  This note was dictated using Dragon naturally speaking software and may contain errors in syntax, spelling, or content which have not been identified prior to signing this note.

## 2023-06-20 ENCOUNTER — Encounter: Payer: Self-pay | Admitting: Family Medicine

## 2023-06-26 ENCOUNTER — Telehealth: Payer: Self-pay | Admitting: Family Medicine

## 2023-06-26 NOTE — Telephone Encounter (Signed)
 Patient's wife notified she can come by today and pick up

## 2023-06-26 NOTE — Telephone Encounter (Signed)
 Copied from CRM [id number]. Topic: General - Other >> Jun 26, 2023 11:20 AM Emylou G wrote: Reason for CRM: Wife called.. needs DNR form.. Pls call her German Koller: [phone]

## 2023-06-27 ENCOUNTER — Ambulatory Visit: Payer: Medicare HMO | Admitting: Physician Assistant

## 2023-08-23 ENCOUNTER — Ambulatory Visit: Admitting: Podiatry

## 2023-11-13 DIAGNOSIS — H409 Unspecified glaucoma: Secondary | ICD-10-CM | POA: Diagnosis not present

## 2023-11-13 DIAGNOSIS — E1121 Type 2 diabetes mellitus with diabetic nephropathy: Secondary | ICD-10-CM | POA: Diagnosis not present

## 2023-11-13 DIAGNOSIS — E876 Hypokalemia: Secondary | ICD-10-CM | POA: Diagnosis not present

## 2023-11-13 DIAGNOSIS — I480 Paroxysmal atrial fibrillation: Secondary | ICD-10-CM | POA: Diagnosis not present

## 2023-11-13 DIAGNOSIS — K219 Gastro-esophageal reflux disease without esophagitis: Secondary | ICD-10-CM | POA: Diagnosis not present

## 2023-11-13 DIAGNOSIS — M109 Gout, unspecified: Secondary | ICD-10-CM | POA: Diagnosis not present

## 2023-11-13 DIAGNOSIS — N183 Chronic kidney disease, stage 3 unspecified: Secondary | ICD-10-CM | POA: Diagnosis not present

## 2023-11-13 DIAGNOSIS — I2510 Atherosclerotic heart disease of native coronary artery without angina pectoris: Secondary | ICD-10-CM | POA: Diagnosis not present

## 2023-11-13 DIAGNOSIS — E568 Deficiency of other vitamins: Secondary | ICD-10-CM | POA: Diagnosis not present

## 2023-11-13 DIAGNOSIS — M1991 Primary osteoarthritis, unspecified site: Secondary | ICD-10-CM | POA: Diagnosis not present

## 2023-11-13 DIAGNOSIS — I679 Cerebrovascular disease, unspecified: Secondary | ICD-10-CM | POA: Diagnosis not present

## 2023-11-13 DIAGNOSIS — G309 Alzheimer's disease, unspecified: Secondary | ICD-10-CM | POA: Diagnosis not present

## 2023-11-15 DIAGNOSIS — G309 Alzheimer's disease, unspecified: Secondary | ICD-10-CM | POA: Diagnosis not present

## 2023-11-15 DIAGNOSIS — I2510 Atherosclerotic heart disease of native coronary artery without angina pectoris: Secondary | ICD-10-CM | POA: Diagnosis not present

## 2023-11-15 DIAGNOSIS — H409 Unspecified glaucoma: Secondary | ICD-10-CM | POA: Diagnosis not present

## 2023-11-15 DIAGNOSIS — K219 Gastro-esophageal reflux disease without esophagitis: Secondary | ICD-10-CM | POA: Diagnosis not present

## 2023-11-15 DIAGNOSIS — E568 Deficiency of other vitamins: Secondary | ICD-10-CM | POA: Diagnosis not present

## 2023-11-15 DIAGNOSIS — I480 Paroxysmal atrial fibrillation: Secondary | ICD-10-CM | POA: Diagnosis not present

## 2023-11-15 DIAGNOSIS — E876 Hypokalemia: Secondary | ICD-10-CM | POA: Diagnosis not present

## 2023-11-15 DIAGNOSIS — M1991 Primary osteoarthritis, unspecified site: Secondary | ICD-10-CM | POA: Diagnosis not present

## 2023-11-15 DIAGNOSIS — M109 Gout, unspecified: Secondary | ICD-10-CM | POA: Diagnosis not present

## 2023-11-15 DIAGNOSIS — E1121 Type 2 diabetes mellitus with diabetic nephropathy: Secondary | ICD-10-CM | POA: Diagnosis not present

## 2023-11-15 DIAGNOSIS — I679 Cerebrovascular disease, unspecified: Secondary | ICD-10-CM | POA: Diagnosis not present

## 2023-11-15 DIAGNOSIS — N183 Chronic kidney disease, stage 3 unspecified: Secondary | ICD-10-CM | POA: Diagnosis not present

## 2023-11-16 DIAGNOSIS — G309 Alzheimer's disease, unspecified: Secondary | ICD-10-CM | POA: Diagnosis not present

## 2023-11-16 DIAGNOSIS — I679 Cerebrovascular disease, unspecified: Secondary | ICD-10-CM | POA: Diagnosis not present

## 2023-11-16 DIAGNOSIS — M109 Gout, unspecified: Secondary | ICD-10-CM | POA: Diagnosis not present

## 2023-11-16 DIAGNOSIS — K219 Gastro-esophageal reflux disease without esophagitis: Secondary | ICD-10-CM | POA: Diagnosis not present

## 2023-11-16 DIAGNOSIS — M1991 Primary osteoarthritis, unspecified site: Secondary | ICD-10-CM | POA: Diagnosis not present

## 2023-11-16 DIAGNOSIS — I2510 Atherosclerotic heart disease of native coronary artery without angina pectoris: Secondary | ICD-10-CM | POA: Diagnosis not present

## 2023-11-16 DIAGNOSIS — I480 Paroxysmal atrial fibrillation: Secondary | ICD-10-CM | POA: Diagnosis not present

## 2023-11-16 DIAGNOSIS — E876 Hypokalemia: Secondary | ICD-10-CM | POA: Diagnosis not present

## 2023-11-16 DIAGNOSIS — H409 Unspecified glaucoma: Secondary | ICD-10-CM | POA: Diagnosis not present

## 2023-11-16 DIAGNOSIS — E568 Deficiency of other vitamins: Secondary | ICD-10-CM | POA: Diagnosis not present

## 2023-11-16 DIAGNOSIS — N183 Chronic kidney disease, stage 3 unspecified: Secondary | ICD-10-CM | POA: Diagnosis not present

## 2023-11-16 DIAGNOSIS — E1121 Type 2 diabetes mellitus with diabetic nephropathy: Secondary | ICD-10-CM | POA: Diagnosis not present

## 2023-11-20 DIAGNOSIS — G309 Alzheimer's disease, unspecified: Secondary | ICD-10-CM | POA: Diagnosis not present

## 2023-11-20 DIAGNOSIS — I679 Cerebrovascular disease, unspecified: Secondary | ICD-10-CM | POA: Diagnosis not present

## 2023-11-20 DIAGNOSIS — E876 Hypokalemia: Secondary | ICD-10-CM | POA: Diagnosis not present

## 2023-11-20 DIAGNOSIS — E568 Deficiency of other vitamins: Secondary | ICD-10-CM | POA: Diagnosis not present

## 2023-11-20 DIAGNOSIS — H409 Unspecified glaucoma: Secondary | ICD-10-CM | POA: Diagnosis not present

## 2023-11-20 DIAGNOSIS — I2510 Atherosclerotic heart disease of native coronary artery without angina pectoris: Secondary | ICD-10-CM | POA: Diagnosis not present

## 2023-11-20 DIAGNOSIS — K219 Gastro-esophageal reflux disease without esophagitis: Secondary | ICD-10-CM | POA: Diagnosis not present

## 2023-11-20 DIAGNOSIS — I480 Paroxysmal atrial fibrillation: Secondary | ICD-10-CM | POA: Diagnosis not present

## 2023-11-20 DIAGNOSIS — N183 Chronic kidney disease, stage 3 unspecified: Secondary | ICD-10-CM | POA: Diagnosis not present

## 2023-11-20 DIAGNOSIS — M109 Gout, unspecified: Secondary | ICD-10-CM | POA: Diagnosis not present

## 2023-11-20 DIAGNOSIS — E1121 Type 2 diabetes mellitus with diabetic nephropathy: Secondary | ICD-10-CM | POA: Diagnosis not present

## 2023-11-20 DIAGNOSIS — M1991 Primary osteoarthritis, unspecified site: Secondary | ICD-10-CM | POA: Diagnosis not present

## 2023-11-26 DIAGNOSIS — E1121 Type 2 diabetes mellitus with diabetic nephropathy: Secondary | ICD-10-CM | POA: Diagnosis not present

## 2023-11-26 DIAGNOSIS — I480 Paroxysmal atrial fibrillation: Secondary | ICD-10-CM | POA: Diagnosis not present

## 2023-11-26 DIAGNOSIS — M1991 Primary osteoarthritis, unspecified site: Secondary | ICD-10-CM | POA: Diagnosis not present

## 2023-11-26 DIAGNOSIS — E876 Hypokalemia: Secondary | ICD-10-CM | POA: Diagnosis not present

## 2023-11-26 DIAGNOSIS — E568 Deficiency of other vitamins: Secondary | ICD-10-CM | POA: Diagnosis not present

## 2023-11-26 DIAGNOSIS — I2510 Atherosclerotic heart disease of native coronary artery without angina pectoris: Secondary | ICD-10-CM | POA: Diagnosis not present

## 2023-11-26 DIAGNOSIS — G309 Alzheimer's disease, unspecified: Secondary | ICD-10-CM | POA: Diagnosis not present

## 2023-11-26 DIAGNOSIS — K219 Gastro-esophageal reflux disease without esophagitis: Secondary | ICD-10-CM | POA: Diagnosis not present

## 2023-11-26 DIAGNOSIS — H409 Unspecified glaucoma: Secondary | ICD-10-CM | POA: Diagnosis not present

## 2023-11-26 DIAGNOSIS — M109 Gout, unspecified: Secondary | ICD-10-CM | POA: Diagnosis not present

## 2023-11-26 DIAGNOSIS — I679 Cerebrovascular disease, unspecified: Secondary | ICD-10-CM | POA: Diagnosis not present

## 2023-11-26 DIAGNOSIS — N183 Chronic kidney disease, stage 3 unspecified: Secondary | ICD-10-CM | POA: Diagnosis not present

## 2023-12-06 DIAGNOSIS — I679 Cerebrovascular disease, unspecified: Secondary | ICD-10-CM | POA: Diagnosis not present

## 2023-12-06 DIAGNOSIS — K219 Gastro-esophageal reflux disease without esophagitis: Secondary | ICD-10-CM | POA: Diagnosis not present

## 2023-12-06 DIAGNOSIS — E1121 Type 2 diabetes mellitus with diabetic nephropathy: Secondary | ICD-10-CM | POA: Diagnosis not present

## 2023-12-06 DIAGNOSIS — E568 Deficiency of other vitamins: Secondary | ICD-10-CM | POA: Diagnosis not present

## 2023-12-06 DIAGNOSIS — N183 Chronic kidney disease, stage 3 unspecified: Secondary | ICD-10-CM | POA: Diagnosis not present

## 2023-12-06 DIAGNOSIS — E876 Hypokalemia: Secondary | ICD-10-CM | POA: Diagnosis not present

## 2023-12-06 DIAGNOSIS — I2510 Atherosclerotic heart disease of native coronary artery without angina pectoris: Secondary | ICD-10-CM | POA: Diagnosis not present

## 2023-12-06 DIAGNOSIS — M1991 Primary osteoarthritis, unspecified site: Secondary | ICD-10-CM | POA: Diagnosis not present

## 2023-12-06 DIAGNOSIS — I480 Paroxysmal atrial fibrillation: Secondary | ICD-10-CM | POA: Diagnosis not present

## 2023-12-06 DIAGNOSIS — M109 Gout, unspecified: Secondary | ICD-10-CM | POA: Diagnosis not present

## 2023-12-06 DIAGNOSIS — G309 Alzheimer's disease, unspecified: Secondary | ICD-10-CM | POA: Diagnosis not present

## 2023-12-06 DIAGNOSIS — H409 Unspecified glaucoma: Secondary | ICD-10-CM | POA: Diagnosis not present

## 2023-12-11 DIAGNOSIS — I2510 Atherosclerotic heart disease of native coronary artery without angina pectoris: Secondary | ICD-10-CM | POA: Diagnosis not present

## 2023-12-11 DIAGNOSIS — E568 Deficiency of other vitamins: Secondary | ICD-10-CM | POA: Diagnosis not present

## 2023-12-11 DIAGNOSIS — M1991 Primary osteoarthritis, unspecified site: Secondary | ICD-10-CM | POA: Diagnosis not present

## 2023-12-11 DIAGNOSIS — M109 Gout, unspecified: Secondary | ICD-10-CM | POA: Diagnosis not present

## 2023-12-11 DIAGNOSIS — E876 Hypokalemia: Secondary | ICD-10-CM | POA: Diagnosis not present

## 2023-12-11 DIAGNOSIS — E1121 Type 2 diabetes mellitus with diabetic nephropathy: Secondary | ICD-10-CM | POA: Diagnosis not present

## 2023-12-11 DIAGNOSIS — I480 Paroxysmal atrial fibrillation: Secondary | ICD-10-CM | POA: Diagnosis not present

## 2023-12-11 DIAGNOSIS — I679 Cerebrovascular disease, unspecified: Secondary | ICD-10-CM | POA: Diagnosis not present

## 2023-12-11 DIAGNOSIS — N183 Chronic kidney disease, stage 3 unspecified: Secondary | ICD-10-CM | POA: Diagnosis not present

## 2023-12-11 DIAGNOSIS — K219 Gastro-esophageal reflux disease without esophagitis: Secondary | ICD-10-CM | POA: Diagnosis not present

## 2023-12-11 DIAGNOSIS — H409 Unspecified glaucoma: Secondary | ICD-10-CM | POA: Diagnosis not present

## 2023-12-11 DIAGNOSIS — G309 Alzheimer's disease, unspecified: Secondary | ICD-10-CM | POA: Diagnosis not present

## 2023-12-17 DIAGNOSIS — E1121 Type 2 diabetes mellitus with diabetic nephropathy: Secondary | ICD-10-CM | POA: Diagnosis not present

## 2023-12-17 DIAGNOSIS — I480 Paroxysmal atrial fibrillation: Secondary | ICD-10-CM | POA: Diagnosis not present

## 2023-12-17 DIAGNOSIS — E876 Hypokalemia: Secondary | ICD-10-CM | POA: Diagnosis not present

## 2023-12-17 DIAGNOSIS — I2510 Atherosclerotic heart disease of native coronary artery without angina pectoris: Secondary | ICD-10-CM | POA: Diagnosis not present

## 2023-12-17 DIAGNOSIS — M109 Gout, unspecified: Secondary | ICD-10-CM | POA: Diagnosis not present

## 2023-12-17 DIAGNOSIS — I679 Cerebrovascular disease, unspecified: Secondary | ICD-10-CM | POA: Diagnosis not present

## 2023-12-17 DIAGNOSIS — E568 Deficiency of other vitamins: Secondary | ICD-10-CM | POA: Diagnosis not present

## 2023-12-17 DIAGNOSIS — K219 Gastro-esophageal reflux disease without esophagitis: Secondary | ICD-10-CM | POA: Diagnosis not present

## 2023-12-17 DIAGNOSIS — N183 Chronic kidney disease, stage 3 unspecified: Secondary | ICD-10-CM | POA: Diagnosis not present

## 2023-12-17 DIAGNOSIS — H409 Unspecified glaucoma: Secondary | ICD-10-CM | POA: Diagnosis not present

## 2023-12-17 DIAGNOSIS — M1991 Primary osteoarthritis, unspecified site: Secondary | ICD-10-CM | POA: Diagnosis not present

## 2023-12-17 DIAGNOSIS — G309 Alzheimer's disease, unspecified: Secondary | ICD-10-CM | POA: Diagnosis not present

## 2024-01-01 DIAGNOSIS — I679 Cerebrovascular disease, unspecified: Secondary | ICD-10-CM | POA: Diagnosis not present

## 2024-01-01 DIAGNOSIS — I70244 Atherosclerosis of native arteries of left leg with ulceration of heel and midfoot: Secondary | ICD-10-CM | POA: Diagnosis not present

## 2024-01-01 DIAGNOSIS — L97311 Non-pressure chronic ulcer of right ankle limited to breakdown of skin: Secondary | ICD-10-CM | POA: Diagnosis not present

## 2024-01-01 DIAGNOSIS — I2510 Atherosclerotic heart disease of native coronary artery without angina pectoris: Secondary | ICD-10-CM | POA: Diagnosis not present

## 2024-01-01 DIAGNOSIS — N183 Chronic kidney disease, stage 3 unspecified: Secondary | ICD-10-CM | POA: Diagnosis not present

## 2024-01-01 DIAGNOSIS — I70233 Atherosclerosis of native arteries of right leg with ulceration of ankle: Secondary | ICD-10-CM | POA: Diagnosis not present

## 2024-01-01 DIAGNOSIS — L89153 Pressure ulcer of sacral region, stage 3: Secondary | ICD-10-CM | POA: Diagnosis not present

## 2024-01-01 DIAGNOSIS — I480 Paroxysmal atrial fibrillation: Secondary | ICD-10-CM | POA: Diagnosis not present

## 2024-01-01 DIAGNOSIS — E1122 Type 2 diabetes mellitus with diabetic chronic kidney disease: Secondary | ICD-10-CM | POA: Diagnosis not present

## 2024-01-01 DIAGNOSIS — R400 Somnolence: Secondary | ICD-10-CM | POA: Diagnosis not present

## 2024-01-01 DIAGNOSIS — M1991 Primary osteoarthritis, unspecified site: Secondary | ICD-10-CM | POA: Diagnosis not present

## 2024-01-01 DIAGNOSIS — G309 Alzheimer's disease, unspecified: Secondary | ICD-10-CM | POA: Diagnosis not present

## 2024-01-08 ENCOUNTER — Encounter: Payer: Self-pay | Admitting: Radiology

## 2024-01-11 DIAGNOSIS — L89153 Pressure ulcer of sacral region, stage 3: Secondary | ICD-10-CM | POA: Diagnosis not present

## 2024-01-11 DIAGNOSIS — L97311 Non-pressure chronic ulcer of right ankle limited to breakdown of skin: Secondary | ICD-10-CM | POA: Diagnosis not present

## 2024-01-11 DIAGNOSIS — E1122 Type 2 diabetes mellitus with diabetic chronic kidney disease: Secondary | ICD-10-CM | POA: Diagnosis not present

## 2024-01-11 DIAGNOSIS — I480 Paroxysmal atrial fibrillation: Secondary | ICD-10-CM | POA: Diagnosis not present

## 2024-01-11 DIAGNOSIS — I70233 Atherosclerosis of native arteries of right leg with ulceration of ankle: Secondary | ICD-10-CM | POA: Diagnosis not present

## 2024-01-11 DIAGNOSIS — R400 Somnolence: Secondary | ICD-10-CM | POA: Diagnosis not present

## 2024-01-11 DIAGNOSIS — I679 Cerebrovascular disease, unspecified: Secondary | ICD-10-CM | POA: Diagnosis not present

## 2024-01-11 DIAGNOSIS — I70244 Atherosclerosis of native arteries of left leg with ulceration of heel and midfoot: Secondary | ICD-10-CM | POA: Diagnosis not present

## 2024-01-11 DIAGNOSIS — I2510 Atherosclerotic heart disease of native coronary artery without angina pectoris: Secondary | ICD-10-CM | POA: Diagnosis not present

## 2024-01-11 DIAGNOSIS — M1991 Primary osteoarthritis, unspecified site: Secondary | ICD-10-CM | POA: Diagnosis not present

## 2024-01-11 DIAGNOSIS — G309 Alzheimer's disease, unspecified: Secondary | ICD-10-CM | POA: Diagnosis not present

## 2024-01-11 DIAGNOSIS — N183 Chronic kidney disease, stage 3 unspecified: Secondary | ICD-10-CM | POA: Diagnosis not present

## 2024-02-05 DEATH — deceased

## 2024-03-25 ENCOUNTER — Ambulatory Visit: Payer: Medicare HMO
